# Patient Record
Sex: Male | Born: 1945 | Race: White | Hispanic: No | Marital: Married | Smoking: Former smoker
Health system: Southern US, Community
[De-identification: ages and names within clinical notes are randomized; demographics above are authoritative.]

## PROBLEM LIST (undated history)

## (undated) DIAGNOSIS — T7840XA Allergy, unspecified, initial encounter: Secondary | ICD-10-CM

## (undated) DIAGNOSIS — G709 Myoneural disorder, unspecified: Secondary | ICD-10-CM

## (undated) DIAGNOSIS — H269 Unspecified cataract: Secondary | ICD-10-CM

## (undated) DIAGNOSIS — I499 Cardiac arrhythmia, unspecified: Secondary | ICD-10-CM

## (undated) DIAGNOSIS — E7800 Pure hypercholesterolemia, unspecified: Secondary | ICD-10-CM

## (undated) DIAGNOSIS — R931 Abnormal findings on diagnostic imaging of heart and coronary circulation: Secondary | ICD-10-CM

## (undated) DIAGNOSIS — G20A1 Parkinson's disease without dyskinesia, without mention of fluctuations: Secondary | ICD-10-CM

## (undated) DIAGNOSIS — E785 Hyperlipidemia, unspecified: Secondary | ICD-10-CM

## (undated) DIAGNOSIS — G4730 Sleep apnea, unspecified: Secondary | ICD-10-CM

## (undated) DIAGNOSIS — G20 Parkinson's disease: Secondary | ICD-10-CM

## (undated) DIAGNOSIS — I700 Atherosclerosis of aorta: Secondary | ICD-10-CM

## (undated) HISTORY — DX: Abnormal findings on diagnostic imaging of heart and coronary circulation: R93.1

## (undated) HISTORY — DX: Myoneural disorder, unspecified: G70.9

## (undated) HISTORY — DX: Atherosclerosis of aorta: I70.0

## (undated) HISTORY — DX: Unspecified cataract: H26.9

## (undated) HISTORY — PX: WISDOM TOOTH EXTRACTION: SHX21

## (undated) HISTORY — DX: Hyperlipidemia, unspecified: E78.5

## (undated) HISTORY — DX: Sleep apnea, unspecified: G47.30

## (undated) HISTORY — PX: TONSILLECTOMY: SUR1361

## (undated) HISTORY — DX: Cardiac arrhythmia, unspecified: I49.9

## (undated) HISTORY — DX: Allergy, unspecified, initial encounter: T78.40XA

---

## 2018-11-04 LAB — HM COLONOSCOPY

## 2020-10-16 ENCOUNTER — Other Ambulatory Visit: Payer: Self-pay

## 2020-10-16 ENCOUNTER — Ambulatory Visit
Admission: EM | Admit: 2020-10-16 | Discharge: 2020-10-16 | Disposition: A | Discharge status: Home / Self Care | Payer: Medicare Other

## 2020-10-16 DIAGNOSIS — J3489 Other specified disorders of nose and nasal sinuses: Secondary | ICD-10-CM

## 2020-10-16 DIAGNOSIS — R0981 Nasal congestion: Secondary | ICD-10-CM

## 2020-10-16 DIAGNOSIS — J019 Acute sinusitis, unspecified: Secondary | ICD-10-CM | POA: Diagnosis not present

## 2020-10-16 DIAGNOSIS — R059 Cough, unspecified: Secondary | ICD-10-CM

## 2020-10-16 MED ORDER — AMOXICILLIN 500 MG PO CAPS: 500.0000 mg | ORAL_CAPSULE | Freq: Three times a day (TID) | ORAL | 0 refills | Status: DC

## 2020-10-16 MED ORDER — PREDNISONE 20 MG PO TABS: ORAL_TABLET | ORAL | 0 refills | Status: DC

## 2020-10-16 MED ORDER — CETIRIZINE HCL 10 MG PO TABS: 10.0000 mg | ORAL_TABLET | Freq: Every day | ORAL | 0 refills | Status: DC

## 2020-10-16 NOTE — ED Triage Notes (Signed)
Patient states he has had sinus congestion, drainage and pain x 2 weeks. Pt feels it is from either the dust or animal dander from family pets. Pt is aox4 and ambulatory.

## 2020-10-16 NOTE — ED Provider Notes (Signed)
[place]   MRN: [id number] DOB: 01-04-1946  Subjective:   [name] is a 74 y.o. male presenting for 2-week history of sinus congestion, sinus pressure and sinus pain.  Patient has also had a cough but no chest pain, shortness of breath, body aches.  Patient presents with his wife who also has very similar symptoms had for the same timeframe.  Symptoms started after they have been packing and moving, had a lot of exposure to dust.  Patient has had both Covid vaccinations, the booster shot as well.  Denies history of heart failure, asthma, smoking history.  He does have a history of allergies.  No current facility-administered medications for this encounter. No current outpatient medications on file.   No Known Allergies  History reviewed. No pertinent past medical history.   History reviewed. No pertinent surgical history.  History reviewed. No pertinent family history.  Social History   Tobacco Use  . Smoking status: Never Smoker  . Smokeless tobacco: Never Used  Vaping Use  . Vaping Use: Never used  Substance Use Topics  . Alcohol use: Yes    Comment: occasionally  . Drug use: Never    ROS   Objective:   Vitals: BP 126/76 (BP Location: Right Arm)   Pulse 89   Temp 98 F (36.7 C) (Oral)   Resp 20   SpO2 96%   Physical Exam Constitutional:      General: He is not in acute distress.    Appearance: Normal appearance. He is well-developed. He is not ill-appearing, toxic-appearing or diaphoretic.  HENT:     Head: Normocephalic and atraumatic.     Right Ear: External ear normal.     Left Ear: External ear normal.     Nose: Congestion present.     Mouth/Throat:     Mouth: Mucous membranes are moist.     Comments: Significant postnasal drainage overlying pharynx. Eyes:     General: No scleral icterus.    Extraocular Movements: Extraocular movements intact.     Pupils: Pupils are equal, round, and reactive to light.  Cardiovascular:      Rate and Rhythm: Normal rate and regular rhythm.     Heart sounds: Normal heart sounds. No murmur heard.  No friction rub. No gallop.   Pulmonary:     Effort: Pulmonary effort is normal. No respiratory distress.     Breath sounds: Normal breath sounds. No stridor. No wheezing, rhonchi or rales.  Neurological:     Mental Status: He is alert and oriented to person, place, and time.  Psychiatric:        Mood and Affect: Mood normal.        Behavior: Behavior normal.        Thought Content: Thought content normal.        Judgment: Judgment normal.     Assessment and Plan :   PDMP not reviewed this encounter.  1. Acute non-recurrent sinusitis, unspecified location   2. Sinus pressure   3. Sinus congestion   4. Cough     Will start empiric treatment for sinusitis with amoxicillin.  Patient requested more aggressive management and therefore will use prednisone course especially given his trouble with dust and pet dander.  Recommended supportive care otherwise and long-term use of Zyrtec given that they live in [place] and have a history of allergies. Counseled patient on potential for adverse effects with medications prescribed/recommended today, ER and return-to-clinic precautions discussed, patient verbalized understanding.  Jaynee Eagles, [place] 10/16/20 [phone]

## 2020-11-25 ENCOUNTER — Encounter: Payer: Self-pay | Admitting: Family Medicine

## 2020-11-25 ENCOUNTER — Ambulatory Visit (INDEPENDENT_AMBULATORY_CARE_PROVIDER_SITE_OTHER): Payer: Medicare HMO | Admitting: Family Medicine

## 2020-11-25 ENCOUNTER — Telehealth: Payer: Self-pay | Admitting: *Deleted

## 2020-11-25 ENCOUNTER — Other Ambulatory Visit: Payer: Self-pay

## 2020-11-25 VITALS — BP 116/62 | HR 75 | Temp 97.5°F | Ht 72.0 in | Wt 232.5 lb

## 2020-11-25 DIAGNOSIS — G20A1 Parkinson's disease without dyskinesia, without mention of fluctuations: Secondary | ICD-10-CM | POA: Insufficient documentation

## 2020-11-25 DIAGNOSIS — Z125 Encounter for screening for malignant neoplasm of prostate: Secondary | ICD-10-CM | POA: Diagnosis not present

## 2020-11-25 DIAGNOSIS — L42 Pityriasis rosea: Secondary | ICD-10-CM | POA: Diagnosis not present

## 2020-11-25 DIAGNOSIS — E782 Mixed hyperlipidemia: Secondary | ICD-10-CM | POA: Diagnosis not present

## 2020-11-25 DIAGNOSIS — G20 Parkinson's disease: Secondary | ICD-10-CM | POA: Diagnosis not present

## 2020-11-25 DIAGNOSIS — E785 Hyperlipidemia, unspecified: Secondary | ICD-10-CM | POA: Insufficient documentation

## 2020-11-25 DIAGNOSIS — Z91038 Other insect allergy status: Secondary | ICD-10-CM | POA: Insufficient documentation

## 2020-11-25 DIAGNOSIS — R600 Localized edema: Secondary | ICD-10-CM | POA: Insufficient documentation

## 2020-11-25 DIAGNOSIS — G20C Parkinsonism, unspecified: Secondary | ICD-10-CM | POA: Insufficient documentation

## 2020-11-25 DIAGNOSIS — H9190 Unspecified hearing loss, unspecified ear: Secondary | ICD-10-CM | POA: Insufficient documentation

## 2020-11-25 LAB — COMPREHENSIVE METABOLIC PANEL
ALT: 13 U/L (ref 0–53)
AST: 24 U/L (ref 0–37)
Albumin: 4.5 g/dL (ref 3.5–5.2)
Alkaline Phosphatase: 33 U/L — ABNORMAL LOW (ref 39–117)
BUN: 16 mg/dL (ref 6–23)
CO2: 27 mEq/L (ref 19–32)
Calcium: 10.1 mg/dL (ref 8.4–10.5)
Chloride: 106 mEq/L (ref 96–112)
Creatinine, Ser: 0.73 mg/dL (ref 0.40–1.50)
GFR: 90.8 mL/min (ref >60.00)
Glucose, Bld: 89 mg/dL (ref 70–99)
Potassium: 4.1 mEq/L (ref 3.5–5.1)
Sodium: 139 mEq/L (ref 135–145)
Total Bilirubin: 0.5 mg/dL (ref 0.2–1.2)
Total Protein: 7.1 g/dL (ref 6.0–8.3)

## 2020-11-25 LAB — LIPID PANEL
Cholesterol: 143 mg/dL (ref 0–200)
HDL: 51.7 mg/dL (ref >39.00)
LDL Cholesterol: 74 mg/dL (ref 0–99)
NonHDL: 90.91
Total CHOL/HDL Ratio: 3
Triglycerides: 85 mg/dL (ref 0.0–149.0)
VLDL: 17 mg/dL (ref 0.0–40.0)

## 2020-11-25 LAB — PSA, MEDICARE: PSA: 0.26 ng/ml (ref 0.10–4.00)

## 2020-11-25 MED ORDER — DESONIDE 0.05 % EX CREA: TOPICAL_CREAM | Freq: Two times a day (BID) | CUTANEOUS | 0 refills | Status: DC

## 2020-11-25 NOTE — Assessment & Plan Note (Addendum)
Taking carbidopa-levodopa 25-100 mg. Tremor is improved. Referral to neurology for continued support

## 2020-11-25 NOTE — Telephone Encounter (Signed)
Agree with checking with patient and doing PA if patient requests. This was previously prescribed by his dermatologist.

## 2020-11-25 NOTE — Assessment & Plan Note (Signed)
Advised f/u if this recurs.

## 2020-11-25 NOTE — Telephone Encounter (Signed)
Received fax from [employer] requesting PA for Desonide 0.05% cream.  Per [employer]:  (formulary) drugs are triamcinolone acetonide topical cream, mometasone topical cream, fluticasone propionate topical cream, betamethasone valerate topical cream AND hydrocortisone topical cream.  Will forward to Dr. Einar Pheasant to see if she wants to prescribe something on formulary or attempt PA.  I did try and call patient to see what other creams he had tried in the past but his voicemail isn't set up so I was unable to leave a message.

## 2020-11-25 NOTE — Assessment & Plan Note (Signed)
Refill desonide as this improves symptoms on right ear

## 2020-11-25 NOTE — Patient Instructions (Addendum)
Great to meet you!  Reach out if you need anything  Have your pharmacy send refill requests when due  #Referral I have placed a referral to a specialist for you. You should receive a phone call from the specialty office. Make sure your voicemail is not full and that if you are able to answer your phone to unknown or new numbers.   It may take up to 2 weeks to hear about the referral. If you do not hear anything in 2 weeks, please call our office and ask to speak with the referral coordinator.

## 2020-11-25 NOTE — Assessment & Plan Note (Signed)
Taking low furosemide 10 mg to help with swelling. Compression socks as well. Cont lasix 10 mg

## 2020-11-25 NOTE — Telephone Encounter (Signed)
Sent a mychart message to pt requesting more information in regards to the cream.

## 2020-11-25 NOTE — Assessment & Plan Note (Signed)
Check labs. Cont fenofibrate 160 mg

## 2020-11-25 NOTE — Progress Notes (Signed)
Subjective:     [name] is a 75 y.o. male presenting for Establish Care     HPI   #skin wound - fell in 2018 and this became a refractory cellulitis - saw vascular surgery last December - lymphedema  - treating with compression sock  #parkinson's - doing well - diagnosed a few years ago - would like to have a local neurologist   Review of Systems   Social History   Tobacco Use  Smoking Status Former Smoker  . Types: Pipe  Smokeless Tobacco Never Used        Objective:    BP Readings from Last 3 Encounters:  11/25/20 116/62  10/16/20 126/76   Wt Readings from Last 3 Encounters:  11/25/20 232 lb 8 oz (105.5 kg)    BP 116/62   Pulse 75   Temp (!) 97.5 F (36.4 C) (Temporal)   Ht 6' (1.829 m)   Wt 232 lb 8 oz (105.5 kg)   SpO2 97%   BMI 31.53 kg/m    Physical Exam Constitutional:      Appearance: Normal appearance. He is not ill-appearing or diaphoretic.  HENT:     Right Ear: External ear normal.     Left Ear: External ear normal.     Nose: Nose normal.  Eyes:     General: No scleral icterus.    Extraocular Movements: Extraocular movements intact.     Conjunctiva/sclera: Conjunctivae normal.  Cardiovascular:     Rate and Rhythm: Normal rate and regular rhythm.     Heart sounds: No murmur heard.   Pulmonary:     Effort: Pulmonary effort is normal. No respiratory distress.     Breath sounds: Normal breath sounds. No wheezing.  Musculoskeletal:     Cervical back: Neck supple.  Skin:    General: Skin is warm and dry.     Comments: Right ear with erythema  Neurological:     Mental Status: He is alert. Mental status is at baseline.     Comments: No cogwheel  Psychiatric:        Mood and Affect: Mood normal.           Assessment & Plan:   Problem List Items Addressed This Visit      Nervous and Auditory   Parkinson's disease ([place]) - Primary    Taking carbidopa-levodopa 25-100 mg. Tremor is improved. Referral to neurology  for continued support      Relevant Medications   carbidopa-levodopa (SINEMET IR) 25-100 MG tablet   pramipexole (MIRAPEX) 1 MG tablet   Other Relevant Orders   Ambulatory referral to Neurology     Musculoskeletal and Integument   Pityriasis rosea    Refill desonide as this improves symptoms on right ear      Relevant Medications   desonide (DESOWEN) 0.05 % cream     Other   Edema of right lower leg    Taking low furosemide 10 mg to help with swelling. Compression socks as well. Cont lasix 10 mg      Relevant Orders   Comprehensive metabolic panel   Hyperlipidemia    Check labs. 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        Other Visit Diagnoses    Screening  for prostate cancer       Relevant Orders   PSA, Medicare       Return in about 1 year (around 11/25/2021).  Lesleigh Noe, MD  This visit occurred during the SARS-CoV-2 public health emergency.  Safety protocols were in place, including screening questions prior to the visit, additional usage of staff PPE, and extensive cleaning of exam room while observing appropriate contact time as indicated for disinfecting solutions.

## 2020-11-26 NOTE — Telephone Encounter (Signed)
Pt states that he is good for at least 3 months with the cream he has at home. No need for refill at this time.

## 2021-02-07 ENCOUNTER — Encounter: Payer: Self-pay | Admitting: Neurology

## 2021-02-07 ENCOUNTER — Ambulatory Visit: Payer: Medicare HMO | Admitting: Neurology

## 2021-02-07 VITALS — BP 106/64 | HR 80 | Ht 74.0 in | Wt 235.0 lb

## 2021-02-07 DIAGNOSIS — E669 Obesity, unspecified: Secondary | ICD-10-CM

## 2021-02-07 DIAGNOSIS — G4719 Other hypersomnia: Secondary | ICD-10-CM

## 2021-02-07 DIAGNOSIS — R498 Other voice and resonance disorders: Secondary | ICD-10-CM | POA: Diagnosis not present

## 2021-02-07 DIAGNOSIS — G20 Parkinson's disease: Secondary | ICD-10-CM | POA: Diagnosis not present

## 2021-02-07 DIAGNOSIS — R634 Abnormal weight loss: Secondary | ICD-10-CM

## 2021-02-07 DIAGNOSIS — R0683 Snoring: Secondary | ICD-10-CM | POA: Diagnosis not present

## 2021-02-07 DIAGNOSIS — R2681 Unsteadiness on feet: Secondary | ICD-10-CM | POA: Diagnosis not present

## 2021-02-07 MED ORDER — PRAMIPEXOLE DIHYDROCHLORIDE 1 MG PO TABS
1.0000 mg | ORAL_TABLET | Freq: Three times a day (TID) | ORAL | 3 refills | Status: DC
Start: 2021-02-07 — End: 2021-12-29

## 2021-02-07 MED ORDER — CARBIDOPA-LEVODOPA 25-100 MG PO TABS
1.0000 | ORAL_TABLET | Freq: Three times a day (TID) | ORAL | 3 refills | Status: DC
Start: 2021-02-07 — End: 2021-12-29

## 2021-02-07 NOTE — Progress Notes (Signed)
Subjective:    Patient ID: [name] is a 75 y.o. male.  HPI     Star Age, MD, PhD [facility] [address] [place], [place] [address] Dear Dr. Einar Pheasant,   I saw your patient, [name], upon your kind request of my neurologic clinic today for initial consultation of his parkinsonism, as a transfer of care.  The patient is accompanied by his wife today, who supplements the history.  As you know, [name] is a 75 year old right-handed gentleman with an underlying medical history of cellulitis, lymphedema, low back pain, hyperlipidemia, and mild obesity, who reports symptoms of Parkinson's disease dating back to late 2018 or early 2019.  He was diagnosed with Parkinson's disease in or around May 2019.  Initially, symptoms consisted of changes in his voice, changes noted in his facial expression, posture and gait changes.  He noticed a hand tremor, it was bilateral as far as he and his wife recall.  Symptoms were not necessarily worse on one side.  He is right-handed and noticed his tremor more on the right.  His mother had a diagnosis of essential tremor.  She lived to be 34, father died at 68.  Father had prostate cancer.  Patient has had changes in his posture over time and gait difficulty, has had some falls, last fall about a month or 2 ago.  He had significant daytime somnolence as part of his initial presentation but no evidence of dream enactment disorder.  He has never had a sleep study.  He does snore but snoring is better when he sleeps elevated with head of bed.  They have purchased an adjustable bed. He is retired, he is a Therapist, nutritional, worked as a Water quality scientist in college.  They moved to [facility].  They moved to [place] in November 2021.  They have a daughter in [place].  Sister also lives close by.  He smokes cigars occasionally.  He drinks alcohol in the form of beer, 1 or 2/day on average.   He previously  followed with a neurologist in [place].  I reviewed your office note from 11/25/2020.  He has been on Sinemet 25-100 mg strength 1 pill 3 times daily and Mirapex 1 mg strength 1 pill 3 times daily. I have reviewed records from his physician, Dr. Aniceto Boss.  His last visit was 07/21/2020, prior to that he was seen on 03/18/2020, at which time his Mirapex was increased from 0.5 mg 3 times daily to 1 mg 3 times daily.  He had a visit on 11/18/2019.  At his visit on 07/14/2019 his Mirapex was 0.25 mg 3 times daily and he was on Sinemet 25-100 mg strength 1 pill 3 times daily.  He was started on Azilect on 11/26/2018.  This was discontinued in May 2020 due to cost.  In October 2019 he was started on Requip and Rytary.  Rytary was discontinued due to cost.  He had side effects of drowsiness when he was on Requip.  He had a DaTscan which was positive for asymmetrically diminished uptake. He had an EEG on 08/24/2020 and I reviewed the report: Impression: Normal EEG during wakefulness.  He had a nuclear medicine DaTscan in [place], [place] on 07/23/2018 and I reviewed the results: Impression: #1 abnormal study. 2.  Asymmetrically diminished putamen activity compatible with Parkinson's disease or related syndrome.  He denies any significant orthostatic lightheadedness.  His wife has noticed a change in his posture.  He had physical therapy when in [place] which was helpful.  He tries to stay active, he is using a stationary bike.  He has occasional constipation.  He has had significant weight loss in the recent past.  He has not had any specific work-up for weight loss.  His wife does report that he eats slowly which is not a new trait.  His Past Medical History Is Significant For: No past medical history on file.  His Past Surgical History Is Significant For: Past Surgical History:  Procedure Laterality Date  . TONSILLECTOMY      His Family History Is Significant For: Family History  Problem Relation  Age of Onset  . Arthritis Mother   . Hearing loss Mother   . Hypertension Mother   . Prostate cancer Father   . Hearing loss Father   . Heart attack Father   . Hyperlipidemia Father   . Hypertension Sister   . Diabetes Daughter   . Heart disease Maternal Grandmother   . Hypertension Maternal Grandmother   . Arthritis Maternal Grandmother     His Social History Is Significant For: Social History   Socioeconomic History  . Marital status: Married    Spouse name: [name]  . Number of children: 1  . Years of education: master's degree  . Highest education level: Not on file  Occupational History  . Not on file  Tobacco Use  . Smoking status: Former Smoker    Types: Pipe  . Smokeless tobacco: Never Used  Vaping Use  . Vaping Use: Never used  Substance and Sexual Activity  . Alcohol use: Yes    Comment: daily, 1-2 servings  . Drug use: Never  . Sexual activity: Yes    Birth control/protection: Post-menopausal  Other Topics Concern  . Not on file  Social History Narrative   11/25/20   From: [place], but from this area, moved back 2022   Living: with wife, [name] [phone])   Work: retired Copywriter, advertising for [employer], [employer] Service, played piano      Family: Cristen in [place], 2 granddaughters       Enjoys: piano, read, crosswords      Exercise: planning to go to the [employer]   Diet: good, yogurt, cereal, big meal at lunchtime, light dinner      Safety   Seat belts: Yes    Guns: no   Safe in relationships: Yes    Social Determinants of Radio broadcast assistant Strain: Not on file  Food Insecurity: Not on file  Transportation Needs: Not on file  Physical Activity: Not on file  Stress: Not on file  Social Connections: Not on file    His Allergies Are:  No Known Allergies:   His Current Medications Are:  Outpatient Encounter Medications as of 02/07/2021  Medication Sig  . aspirin 81 MG chewable tablet Chew 81 mg by mouth daily.  .  carbidopa-levodopa (SINEMET IR) 25-100 MG tablet Take 1 tablet by mouth 3 (three) times daily.  . cetirizine (ZYRTEC ALLERGY) 10 MG tablet Take 1 tablet (10 mg total) by mouth daily. (Patient taking differently: Take 10 mg by mouth as needed.)  . desonide (DESOWEN) 0.05 % cream Apply topically 2 (two) times daily.  . fenofibrate 160 MG tablet Take 160 mg by mouth daily.  . furosemide (LASIX) 10 MG/ML solution Take 10 mg by mouth daily.  Marland Kitchen glucosamine-chondroitin 500-400 MG tablet Take 1 tablet by mouth daily.  . Multiple Vitamins-Minerals (MULTIVITAMIN WITH MINERALS) tablet  Take 1 tablet by mouth daily.  . mupirocin ointment (BACTROBAN) 2 % Apply topically as needed.  . pramipexole (MIRAPEX) 1 MG tablet Take 1 tablet (1 mg total) by mouth in the morning, at noon, and at bedtime.  . [DISCONTINUED] carbidopa-levodopa (SINEMET IR) 25-100 MG tablet Take 1 tablet by mouth 3 (three) times daily.  . [DISCONTINUED] pramipexole (MIRAPEX) 1 MG tablet Take 1 mg by mouth in the morning, at noon, and at bedtime.   No facility-administered encounter medications on file as of 02/07/2021.  :   Review of Systems:  Out of a complete 14 point review of systems, all are reviewed and negative with the exception of these symptoms as listed below:    Review of Systems  Neurological:       Here for consult on PD. Pt reports he was d/x with PD in [place] back in 2019. Dat scan completed and confirmed PD. Pt was started Rytary after the DAT Scan but d/c due to price. Was tried on Requip but d/c due to s/e ( mainly fatigue). Pt is currently taking Pramipexole 1 mg tid and Carb/Levo 25-100 mg tid. Reports he is tolerating these meds well.   Pt reports he struggles with balance in the morning and right leg weakness. He also reports a 30 LB weight loss since 2019, softer speaking volume, along with hunched posture when walking . 1 fall over the last 3 months denies any serious injuries.     Objective:   Neurological Exam  Physical Exam Physical Examination:   Vitals:   02/07/21 1310  BP: 106/64  Pulse: 80  SpO2: 97%    General Examination: The patient is a very pleasant 75 y.o. male in no acute distress. He appears well-developed and well-nourished and well groomed.   HEENT: Normocephalic, atraumatic, pupils are equal, round and reactive to light, extraocular tracking is mild to moderately impaired with saccadic breakdown, no obvious gaze limitation, maybe mildly to upgaze.  He has moderate facial masking, moderate hypophonia, no significant dysarthria or sialorrhea.  Hearing is grossly intact with bilateral hearing aids in place.  Normal facial sensation.  Airway examination reveals mild airway crowding, tongue protrudes centrally and palate elevates symmetrically, no carotid bruits.  Mild nuchal rigidity noted.  No lip, neck or jaw tremor.  Chest: Clear to auscultation without wheezing, rhonchi or crackles noted.  Heart: S1+S2+0, regular and normal without murmurs, rubs or gallops noted.   Abdomen: Soft, non-tender and non-distended.  Extremities: There is trace edema in the left ankle, right distal lower extremity has 1+ pitting edema.   Skin: Warm and dry without trophic changes noted.   Musculoskeletal: exam reveals no obvious joint deformities.   Neurologically:  Mental status: The patient is awake, alert and oriented in all 4 spheres. His immediate and remote memory, attention, language skills and fund of knowledge are appropriate. Thought process is linear. Mood is normal and affect is normal.  Cranial nerves II - XII are as described above under HEENT exam.  Motor exam: Normal bulk, global strength normal, mild increase in tone in the left upper extremity noted, without telltale cogwheeling, no resting tremor.  On Archimedes spiral drawing, he has minimal insecurity with the right hand which is his dominant hand, mild to moderate difficulty with mild trembling in the left  upper extremity.  He has no significant postural or action tremor.    Romberg is not tested due to safety concerns. Fine motor skills and coordination: He has mild difficulty globally  with fine motor skills including finger taps, hand movements, foot taps.  Slightly more noticeable on the left compared to right.    Cerebellar testing: No dysmetria or intention tremor. There is no truncal or gait ataxia.  Sensory exam: intact to light touch, temperature and vibration in the upper and lower extremities.  Gait, station and balance: He stands with mild difficulty, posture is moderately stooped.  He walks with a slight limp on the right.  He walks with a slight waddle.  No telltale small steps or shuffling but decreased arm swing bilaterally noted.  No walking aid.  Assessment and Plan:   In summary, ARYON NHAM is a very pleasant 75 y.o.-year old male with an underlying medical history of cellulitis, lymphedema, low back pain, hyperlipidemia, and mild obesity, who presents for evaluation of his parkinsonism, he was previously diagnosed with Parkinson's disease in early 2019.  He had a DaTscan on 07/23/2018 which indicated an abnormal study with asymmetrically diminished putamen activity compatible with Parkinson's disease or related syndrome.  He has been on several different medications over time.  Some of the medications were cost prohibitive including Rytary and Azilect.  He had side effects with ropinirole, particularly drowsiness.  He is currently established on generic Sinemet 25-100 mg strength 1 pill 3 times daily as well as pramipexole 1 mg 3 times daily.  He has had daytime somnolence and given his snoring and mild obesity, it would be of help to exclude underlying sleep disordered breathing.  He may be at risk for OSA.  He has lost weight, mostly unintentional, he is advised to follow-up with your office regarding work-up for unintentional weight loss.  Parkinson's patients do have a tendency to  lose weight over time, particularly due to burning excess calories due to involuntary movements and in part due to slower eating and smaller portions over time.  He is advised to continue to stay active mentally and physically.  He is furthermore advised to reduce and eliminate daily alcohol consumption as it can affect him adversely with respect to his balance and may not combine well with his current medications.  He had physical therapy in the past which was successful.  He is advised to seek evaluation through neuro rehab with PT, OT and ST.  He is agreeable to a referral.  He is advised to continue with his current medication regimen and I renewed his prescriptions.  We talked about his gait changes and posture changes and parkinsonism in general.  He has some left-sided lateralization, possibly a left-sided predominant Parkinson's disease.  Currently, he does not have any telltale tremor.  He is advised to follow-up in this clinic routinely in 3 to 4 months, sooner if needed.  We will schedule a sleep study and call him with the results in the interim as well.  I answered all the questions today and the patient and his wife were in agreement. Thank you very much for allowing me to participate in the care of this nice patient. If I can be of any further assistance to you please do not hesitate to call me at [phone].  Sincerely,   Star Age, MD, PhD

## 2021-02-07 NOTE — Patient Instructions (Signed)
It was nice to meet you both today.  I think you can continue with your current Parkinson's medication.  It sounds like you have remained fairly stable, which is reassuring. Nevertheless, as you know, this disease does progress with time. It can affect your balance, your memory, your mood, your bowel and bladder function, your posture, balance and walking and your activities of daily living. However, there are good supportive treatments and symptomatic treatments available, so most patients have a change to a good quality life and life expectancy is not typically altered. Overall you are doing fairly well but I do want to suggest a few things today:  Remember to drink plenty of fluid at least 6 glasses (8 oz each), eat healthy meals and do not skip any meals. Try to eat protein with a every meal and eat a healthy snack such as fruit or nuts in between meals. Try to keep a regular sleep-wake schedule and try to exercise daily, particularly in the form of walking, 20-30 minutes a day, if you can.   Taking your medication on schedule is key.   Try to stay active physically and mentally.   As far as your medications are concerned, I would like to suggest that you continue to take your current medications, I have renewed your prescriptions.   I have made a referral to neuro rehab for evaluations with PT, OT and speech therapy.  I have ordered a sleep study to rule out underlying sleep apnea as a possible cause of your sleepiness.  I would like to see you back in 3-4 months, sooner if we need to. Please call us with any interim questions, concerns, problems, updates or refill requests.  Our phone number is [phone]. We also have an after hours call service for urgent matters and there is a physician on-call for urgent questions, that cannot wait till the next work day. For any emergencies you know to call 911 or go to the nearest emergency room.   You can email me through my chart and also leave a  phone message for Jinny Blossom, my nurse.

## 2021-02-09 ENCOUNTER — Ambulatory Visit: Payer: Medicare HMO | Admitting: Neurology

## 2021-02-14 ENCOUNTER — Other Ambulatory Visit: Payer: Self-pay

## 2021-02-14 MED ORDER — FENOFIBRATE 160 MG PO TABS
160.0000 mg | ORAL_TABLET | Freq: Every day | ORAL | 3 refills | Status: DC
Start: 2021-02-14 — End: 2022-02-14

## 2021-02-14 NOTE — Telephone Encounter (Signed)
Fax received from pharmacy for refill on Fenofibrate. Never given by you in the past. Last seen in office on 11/25/2020.

## 2021-02-21 ENCOUNTER — Ambulatory Visit (INDEPENDENT_AMBULATORY_CARE_PROVIDER_SITE_OTHER): Payer: Medicare HMO | Admitting: Neurology

## 2021-02-21 ENCOUNTER — Other Ambulatory Visit: Payer: Self-pay

## 2021-02-21 DIAGNOSIS — G4761 Periodic limb movement disorder: Secondary | ICD-10-CM

## 2021-02-21 DIAGNOSIS — E669 Obesity, unspecified: Secondary | ICD-10-CM

## 2021-02-21 DIAGNOSIS — G4733 Obstructive sleep apnea (adult) (pediatric): Secondary | ICD-10-CM | POA: Diagnosis not present

## 2021-02-21 DIAGNOSIS — G4720 Circadian rhythm sleep disorder, unspecified type: Secondary | ICD-10-CM

## 2021-02-21 DIAGNOSIS — R498 Other voice and resonance disorders: Secondary | ICD-10-CM

## 2021-02-21 DIAGNOSIS — G4719 Other hypersomnia: Secondary | ICD-10-CM

## 2021-02-21 DIAGNOSIS — R2681 Unsteadiness on feet: Secondary | ICD-10-CM

## 2021-02-21 DIAGNOSIS — E66811 Obesity, class 1: Secondary | ICD-10-CM

## 2021-02-21 DIAGNOSIS — G20 Parkinson's disease: Secondary | ICD-10-CM

## 2021-02-21 DIAGNOSIS — R0683 Snoring: Secondary | ICD-10-CM

## 2021-02-21 DIAGNOSIS — R9431 Abnormal electrocardiogram [ECG] [EKG]: Secondary | ICD-10-CM

## 2021-02-21 DIAGNOSIS — R634 Abnormal weight loss: Secondary | ICD-10-CM

## 2021-02-21 DIAGNOSIS — G20C Parkinsonism, unspecified: Secondary | ICD-10-CM

## 2021-03-04 NOTE — Addendum Note (Signed)
Addended by: Star Age on: 03/04/2021 01:59 PM   Modules accepted: Orders

## 2021-03-04 NOTE — Progress Notes (Signed)
Patient referred by Dr. Einar Pheasant for his PD, seen by me on 02/07/21, diagnostic PSG on 02/21/21.    Please call and notify the patient that the recent sleep study showed obstructive sleep apnea. OSA is overall mild, but worth treating to see if he feels better after treatment. To that end I recommend treatment for this in the form of autoPAP, which means, that we don't have to bring him back for a second sleep study with CPAP, but will let him try an autoPAP machine at home, through a DME company (of his choice, or as per insurance requirement). The DME representative will educate him on how to use the machine, how to put the mask on, etc. I have placed an order in the chart. Please send referral, talk to patient, send report to referring MD. We will need a FU in sleep clinic for 10 weeks post-PAP set up, please arrange that with me or one of our NPs. Thanks,   He was also noted to have significant leg twitching in his sleep but these did not actually cause any major sleep disruptions.  His leg twitching may correlate with his Parkinson's disease diagnosis.  In addition, on EKG, he had frequent irregularities including extra beats which we call PACs most likely.  I would like for him to follow-up with his primary care physician about this and get a full EKG, also consider seeing a cardiologist. Please route PSG report to PCP as well for reference.   Star Age, MD, PhD [facility])

## 2021-03-04 NOTE — Procedures (Signed)
PATIENT'S NAME:  [name], [name] DOB:      12-03-1945      MR#:    [id number]     DATE OF RECORDING: 02/21/2021 REFERRING M.D.:  Waunita Schooner, MD Study Performed:   Baseline Polysomnogram HISTORY: 75 year old man with a history of cellulitis, lymphedema, low back pain, hyperlipidemia, Parkinson's disease and mild obesity, who reports snoring and daytime somnolence. The patient endorsed the Epworth Sleepiness Scale at 7 points. The patient's weight 235 pounds with a height of 72 (inches), resulting in a BMI of 32. kg/m2. The patient's neck circumference measured 16 inches.  CURRENT MEDICATIONS: Aspirin, Sinement IR, Zyrtec, Desowen, Fenofibrate, Lasix, Glucosamine-Chondroitin, Multivitamin with Minerals, Bactroban, Mirapex   PROCEDURE:  This is a multichannel digital polysomnogram utilizing the Somnostar 11.2 system.  Electrodes and sensors were applied and monitored per AASM Specifications.   EEG, EOG, Chin and Limb EMG, were sampled at 200 Hz.  ECG, Snore and Nasal Pressure, Thermal Airflow, Respiratory Effort, CPAP Flow and Pressure, Oximetry was sampled at 50 Hz. Digital video and audio were recorded.      BASELINE STUDY  Lights Out was at 21:41 and Lights On at 05:09.  Total recording time (TRT) was 448.5 minutes, with a total sleep time (TST) of 297.5 minutes.   The patient's sleep latency was 19.5 minutes.  REM latency was 141 minutes, which is delayed. The sleep efficiency was 66.3%.     SLEEP ARCHITECTURE: WASO (Wake after sleep onset) was 133.5 minutes with mild to moderate sleep fragmentation noted. There were 64 minutes in Stage N1, 167.5 minutes Stage N2, 28.5 minutes Stage N3 and 37.5 minutes in Stage REM.  The percentage of Stage N1 was 21.5%, which is markedly increased, Stage N2 was 56.3%, Stage N3 was 9.6% and Stage R (REM sleep) was 12.6%, which is reduced. The arousals were noted as: 11 were spontaneous, 5 were associated with PLMs, 1 were associated with respiratory  events.  RESPIRATORY ANALYSIS:  There were a total of 28 respiratory events:  1 obstructive apneas, 0 central apneas and 0 mixed apneas with a total of 1 apneas and an apnea index (AI) of .2 /hour. There were 27 hypopneas with a hypopnea index of 5.4 /hour. The patient also had 0 respiratory event related arousals (RERAs).      The total APNEA/HYPOPNEA INDEX (AHI) was 5.6/hour and the total RESPIRATORY DISTURBANCE INDEX was  5.6 /hour.  19 events occurred in REM sleep and 18 events in NREM. The REM AHI was 30.4 /hour, versus a non-REM AHI of 2.1. The patient spent 136.5 minutes of total sleep time in the supine position and 161 minutes in non-supine.. The supine AHI was 10.5 versus a non-supine AHI of 1.5.  OXYGEN SATURATION & C02:  The Wake baseline 02 saturation was 91%, with the lowest being 83%. Time spent below 89% saturation equaled 12 minutes.  PERIODIC LIMB MOVEMENTS: The patient had a total of 423 Periodic Limb Movements.  The Periodic Limb Movement (PLM) index was 85.3 and the PLM Arousal index was 1./hour.  Audio and video analysis did not show any abnormal or unusual movements, behaviors, phonations or vocalizations. The patient took no bathroom breaks. Mild snoring was noted. The EKG was irregular throughout the night with what appeared to be frequent PACs (premature atrial contractions).   Post-study, the patient indicated that sleep was the same as usual.   IMPRESSION:  1. Obstructive Sleep Apnea (OSA) 2. Periodic Limb Movement Disorder (PLMD) 3. Dysfunctions associated with sleep stages or  arousal from sleep 4. Non-specific abnormal EKG  RECOMMENDATIONS:  1. This study demonstrates overall mild obstructive sleep apnea, severe in REM sleep with a total AHI of 5.6/hour, REM AHI of 30.4/hour, and O2 nadir of 83%. Given the patient's medical history and sleep related complaints, treatment with positive airway pressure is recommended; this can be achieved in the form of autoPAP.  Alternatively, a full-night CPAP titration study would allow optimization of therapy if needed. Other treatment options may include avoidance of supine sleep position along with weight loss, upper airway or jaw surgery in selected patients or the use of an oral appliance in certain patients. ENT evaluation and/or consultation with a maxillofacial surgeon or dentist may be feasible in some instances. Please note that untreated obstructive sleep apnea may carry additional perioperative morbidity. Patients with significant obstructive sleep apnea should receive perioperative PAP therapy and the surgeons and particularly the anesthesiologist should be informed of the diagnosis and the severity of the sleep disordered breathing.  2. The study showed an irregular EKG throughout the night with what appeared to be frequent PACs on single lead EKG; clinical correlation is recommended and consultation with cardiology may be feasible.  3. Severe PLMs (periodic limb movements of sleep) were noted during this study with no significant arousals; clinical correlation is recommended. Patients with Parkinson's disease, have a higher risk for PLM's and also restless leg symptoms.  PLM's may improve with sleep apnea treatment.   4. The patient should be cautioned not to drive, work at heights, or operate dangerous or heavy equipment when tired or sleepy. Review and reiteration of good sleep hygiene measures should be pursued with any patient. 5. The patient will be seen in follow-up by Dr. Rexene Alberts at [facility] for discussion of the test results and further management strategies. The referring provider will be notified of the test results.  I certify that I have reviewed the entire raw data recording prior to the issuance of this report in accordance with the Standards of Accreditation of the American Academy of Sleep Medicine (AASM)  Star Age, MD, PhD Diplomat, American Board of Neurology and Sleep Medicine (Neurology and Sleep  Medicine)

## 2021-03-08 ENCOUNTER — Telehealth: Payer: Self-pay

## 2021-03-08 NOTE — Telephone Encounter (Signed)
I called [name]. I advised [name] that Dr. Rexene Alberts reviewed their sleep study results and found that [name] has mild osa but worth treating to see if he feels better. Dr. Rexene Alberts recommends that [name] start an autopap for treatmetn. I reviewed PAP compliance expectations with the [name]. [name] is agreeable to starting an auto-PAP. I advised [name] that an order will be sent to a DME, [facility], and [employer] will call the [name] within about one week after they file with the [name]'s insurance. [facility] will show the [name] how to use the machine, fit for masks, and troubleshoot the auto-PAP if needed. [name] understands about the national shortage of PAP machines and will CB once he has started on the machine. [name] understands he will need to be seen within 31-90 days of starting. A letter with all of this information in it will be mailed to the [name] as a reminder. I verified with the [name] that the address we have on file is correct. [name] verbalized understanding of results. [name] had no questions at this time but was encouraged to call back if questions arise. I have sent the order to [employer] and have received confirmation that they have received the order.

## 2021-03-08 NOTE — Telephone Encounter (Signed)
-----   Message from Star Age, MD sent at 03/04/2021  1:59 PM EDT ----- Patient referred by Dr. Einar Pheasant for his PD, seen by me on 02/07/21, diagnostic PSG on 02/21/21.    Please call and notify the patient that the recent sleep study showed obstructive sleep apnea. OSA is overall mild, but worth treating to see if he feels better after treatment. To that end I recommend treatment for this in the form of autoPAP, which means, that we don't have to bring him back for a second sleep study with CPAP, but will let him try an autoPAP machine at home, through a DME company (of his choice, or as per insurance requirement). The DME representative will educate him on how to use the machine, how to put the mask on, etc. I have placed an order in the chart. Please send referral, talk to patient, send report to referring MD. We will need a FU in sleep clinic for 10 weeks post-PAP set up, please arrange that with me or one of our NPs. Thanks,   He was also noted to have significant leg twitching in his sleep but these did not actually cause any major sleep disruptions.  His leg twitching may correlate with his Parkinson's disease diagnosis.  In addition, on EKG, he had frequent irregularities including extra beats which we call PACs most likely.  I would like for him to follow-up with his primary care physician about this and get a full EKG, also consider seeing a cardiologist. Please route PSG report to PCP as well for reference.   Star Age, MD, PhD [facility])

## 2021-04-11 ENCOUNTER — Other Ambulatory Visit: Payer: Self-pay

## 2021-04-11 ENCOUNTER — Encounter: Payer: Self-pay | Admitting: Emergency Medicine

## 2021-04-11 ENCOUNTER — Ambulatory Visit
Admission: EM | Admit: 2021-04-11 | Discharge: 2021-04-11 | Disposition: A | Discharge status: Home / Self Care | Payer: Medicare HMO | Attending: Physician Assistant | Admitting: Physician Assistant

## 2021-04-11 DIAGNOSIS — R059 Cough, unspecified: Secondary | ICD-10-CM | POA: Diagnosis not present

## 2021-04-11 DIAGNOSIS — U071 COVID-19: Secondary | ICD-10-CM

## 2021-04-11 HISTORY — DX: Parkinson's disease without dyskinesia, without mention of fluctuations: G20.A1

## 2021-04-11 HISTORY — DX: Pure hypercholesterolemia, unspecified: E78.00

## 2021-04-11 HISTORY — DX: Parkinson's disease: G20

## 2021-04-11 MED ORDER — MOLNUPIRAVIR EUA 200MG CAPSULE: 4.0000 | ORAL_CAPSULE | Freq: Two times a day (BID) | ORAL | 0 refills | Status: AC

## 2021-04-11 MED ORDER — ALBUTEROL SULFATE HFA 108 (90 BASE) MCG/ACT IN AERS
1.0000 | INHALATION_SPRAY | Freq: Four times a day (QID) | RESPIRATORY_TRACT | 0 refills | Status: DC | PRN
Start: 2021-04-11 — End: 2022-01-12

## 2021-04-11 NOTE — Discharge Instructions (Signed)
Use albuterol inhaler as needed for shortness of breath.  Continue with Mucinex May take Ibuprofen as needed for body aches.   You are being prescribed MOLNUPIRAVIR for COVID-19 infection.    Please call the pharmacy or go through the drive through vs going inside if you are picking up the mediation yourself to prevent further spread. If prescribed to a [facility] affiliated pharmacy, a pharmacist will bring the medication out to your car.   ADMINISTRATION INSTRUCTIONS: Take with or without food. Swallow the tablets whole. Don't chew, crush, or break the medications because it might not work as well  For each dose of the medication, you should be taking FOUR tablets at one time, TWICE a day   Finish your full five-day course of Molnupiravir even if you feel better before you're done. Stopping this medication too early can make it less effective to prevent severe illness related to [place].    Molnupiravir is prescribed for YOU ONLY. Don't share it with others, even if they have similar symptoms as you. This medication might not be right for everyone.   Make sure to take steps to protect yourself and others while you're taking this medication in order to get well soon and to prevent others from getting sick with COVID-19.     COMMON SIDE EFFECTS: Diarrhea Nausea  Dizziness    If your COVID-19 symptoms get worse, get medical help right away. Call 911 if you experience symptoms such as worsening cough, trouble breathing, chest pain that doesn't go away, confusion, a hard time staying awake, and pale or blue-colored skin. This medication won't prevent all COVID-19 cases from getting worse.

## 2021-04-11 NOTE — ED Provider Notes (Signed)
[facility]    CSN: [id number] Arrival date & time: 04/11/21  1142      History   Chief Complaint Chief Complaint  Patient presents with  . Cough    HPI [name] is a 75 y.o. male.   Pt tested positive for COVID 4 days ago, sx onset 4 days ago.  He is experiencing cough, congestion, fatigue.  Denies shortness of breath, fever, n/v/d.  Wife is positive as well.  He is taking Mucinex with temporary relief.  Called PCP earlier today and advised to be seen in UC.  Wife was prescribed antiviral, he is requesting the same.      Past Medical History:  Diagnosis Date  . High cholesterol   . Parkinson disease [facility])     Patient Active Problem List   Diagnosis Date Noted  . Parkinson's disease ([place]) 11/25/2020  . Edema of right lower leg 11/25/2020  . Hyperlipidemia 11/25/2020  . 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 Hearing loss 11/25/2020  . Pityriasis rosea 11/25/2020    Past Surgical History:  Procedure Laterality Date  . TONSILLECTOMY         Home Medications    Prior to Admission medications   Medication Sig Start Date End Date Taking? Authorizing Provider  albuterol (VENTOLIN HFA) 108 (90 Base) MCG/ACT inhaler Inhale 1-2 puffs into the lungs every 6 (six) hours as needed for wheezing or shortness of breath. 04/11/21  Yes Alexio Sroka, PA-C  molnupiravir EUA 200 mg CAPS Take 4 capsules (800 mg total) by mouth 2 (two) times daily for 5 days. 04/11/21 04/16/21 Yes Francyne Arreaga, PA-C  aspirin 81 MG chewable tablet Chew 81 mg by mouth daily.    [provider]  carbidopa-levodopa (SINEMET IR) 25-100 MG tablet Take 1 tablet by mouth 3 (three) times daily. 02/07/21   Star Age, MD  cetirizine (ZYRTEC ALLERGY) 10 MG tablet Take 1 tablet (10 mg total) by mouth daily. Patient taking differently: Take 10 mg by mouth as needed. 10/16/20   Jaynee Eagles, PA-C  desonide (DESOWEN) 0.05 % cream Apply topically 2 (two) times daily. 11/25/20   Lesleigh Noe, MD  fenofibrate 160 MG tablet Take 1 tablet (160 mg total) by mouth daily. 02/14/21   Lesleigh Noe, MD  furosemide (LASIX) 10 MG/ML solution Take 10 mg by mouth daily.    [provider]  glucosamine-chondroitin 500-400 MG tablet Take 1 tablet by mouth daily.    [provider]  Multiple Vitamins-Minerals (MULTIVITAMIN WITH MINERALS) tablet Take 1 tablet by mouth daily.    [provider]  mupirocin ointment (BACTROBAN) 2 % Apply topically as needed. 07/14/20   [provider]  pramipexole (MIRAPEX) 1 MG tablet Take 1 tablet (1 mg total) by mouth in the morning, at noon, and at bedtime. 02/07/21   Star Age, MD    Family History Family History  Problem Relation Age of Onset  . Arthritis Mother   . Hearing loss Mother   . Hypertension Mother   . Prostate cancer Father   . Hearing loss Father   . Heart attack Father   . Hyperlipidemia Father   . Hypertension Sister   . Diabetes Daughter   . Heart disease Maternal Grandmother   . Hypertension Maternal Grandmother   . Arthritis Maternal Grandmother     Social History Social History   Tobacco Use  . Smoking status: Former Smoker    Types: Pipe  . Smokeless tobacco: Never  Used  Vaping Use  . Vaping Use: Never used  Substance Use Topics  . Alcohol use: Yes    Comment: daily, 1-2 servings  . Drug use: Never     Allergies   Patient has no known allergies.   Review of Systems Review of Systems  Constitutional: Negative for chills and fever.  HENT: Positive for congestion. Negative for ear pain and sore throat.   Eyes: Negative for pain and visual disturbance.  Respiratory: Positive for cough. Negative for shortness of breath and wheezing.   Cardiovascular: Negative for chest pain and palpitations.  Gastrointestinal: Negative for abdominal pain and vomiting.  Genitourinary: Negative for dysuria and hematuria.  Musculoskeletal: Negative for arthralgias and back pain.  Skin:  Negative for color change and rash.  Neurological: Negative for seizures and syncope.  All other systems reviewed and are negative.    Physical Exam Triage Vital Signs ED Triage Vitals  Enc Vitals Group     BP 04/11/21 1317 115/67     Pulse Rate 04/11/21 1317 84     Resp 04/11/21 1317 18     Temp 04/11/21 1317 97.8 F (36.6 C)     Temp Source 04/11/21 1317 Oral     SpO2 04/11/21 1317 94 %     Weight --      Height --      Head Circumference --      Peak Flow --      Pain Score 04/11/21 1339 0     Pain Loc --      Pain Edu? --      Excl. in [place]? --    No data found.  Updated Vital Signs BP 115/67 (BP Location: Left Arm)   Pulse 84   Temp 97.8 F (36.6 C) (Oral)   Resp 18   SpO2 94%   Visual Acuity Right Eye Distance:   Left Eye Distance:   Bilateral Distance:    Right Eye Near:   Left Eye Near:    Bilateral Near:     Physical Exam Vitals and nursing note reviewed.  Constitutional:      Appearance: He is well-developed.  HENT:     Head: Normocephalic and atraumatic.  Eyes:     Conjunctiva/sclera: Conjunctivae normal.  Cardiovascular:     Rate and Rhythm: Normal rate and regular rhythm.     Heart sounds: No murmur heard.   Pulmonary:     Effort: Pulmonary effort is normal. No respiratory distress.     Breath sounds: Examination of the right-lower field reveals wheezing. Examination of the left-lower field reveals wheezing. Wheezing present.  Abdominal:     Palpations: Abdomen is soft.     Tenderness: There is no abdominal tenderness.  Musculoskeletal:     Cervical back: Neck supple.  Skin:    General: Skin is warm and dry.  Neurological:     Mental Status: He is alert.      UC Treatments / Results  Labs (all labs ordered are listed, but only abnormal results are displayed) Labs Reviewed - No data to display  EKG   Radiology No results found.  Procedures Procedures (including critical care time)  Medications Ordered in UC Medications  - No data to display  Initial Impression / Assessment and Plan / UC Course  I have reviewed the triage vital signs and the nursing notes.  Pertinent labs & imaging results that were available during my care of the patient were reviewed by me and considered in  my medical decision making (see chart for details).     Vitals WNL, pt overall stable, well appearing.  No shortness of breath, O2 94%.  molnupiravir prescribed as well as albuterol inhaler.  Pt will continue with supportive care. Return precautions discussed.  Final Clinical Impressions(s) / UC Diagnoses   Final diagnoses:  COVID  Cough     Discharge Instructions     Use albuterol inhaler as needed for shortness of breath.  Continue with Mucinex May take Ibuprofen as needed for body aches.   You are being prescribed MOLNUPIRAVIR for COVID-19 infection.    Please call the pharmacy or go through the drive through vs going inside if you are picking up the mediation yourself to prevent further spread. If prescribed to a [facility] affiliated pharmacy, a pharmacist will bring the medication out to your car.   ADMINISTRATION INSTRUCTIONS: 1. Take with or without food. Swallow the tablets whole. Don't chew, crush, or break the medications because it might not work as well  2. For each dose of the medication, you should be taking FOUR tablets at one time, TWICE a day   3. Finish your full five-day course of Molnupiravir even if you feel better before you're done. Stopping this medication too early can make it less effective to prevent severe illness related to [place].    4. Molnupiravir is prescribed for YOU ONLY. Don't share it with others, even if they have similar symptoms as you. This medication might not be right for everyone.   5. Make sure to take steps to protect yourself and others while you're taking this medication in order to get well soon and to prevent others from getting sick with COVID-19.     COMMON SIDE  EFFECTS: 1. Diarrhea 2. Nausea  3. Dizziness    If your COVID-19 symptoms get worse, get medical help right away. Call 911 if you experience symptoms such as worsening cough, trouble breathing, chest pain that doesn't go away, confusion, a hard time staying awake, and pale or blue-colored skin. This medication won't prevent all COVID-19 cases from getting worse.      ED Prescriptions    Medication Sig Dispense Auth. Provider   molnupiravir EUA 200 mg CAPS Take 4 capsules (800 mg total) by mouth 2 (two) times daily for 5 days. 40 capsule Carah Barrientes, PA-C   albuterol (VENTOLIN HFA) 108 (90 Base) MCG/ACT inhaler Inhale 1-2 puffs into the lungs every 6 (six) hours as needed for wheezing or shortness of breath. 1 each Konrad Felix, PA-C     PDMP not reviewed this encounter.   Konrad Felix, PA-C 04/11/21 1408

## 2021-04-11 NOTE — ED Triage Notes (Signed)
[name] tested positive for covid with home test.  Nurse at doctors office suggested patient come to [facility] to get medication specific to covid

## 2021-04-12 ENCOUNTER — Telehealth: Payer: Self-pay

## 2021-04-12 NOTE — Telephone Encounter (Signed)
[place] Night - Client TELEPHONE ADVICE RECORD [facility] Patient Name: [name] [facility] Gender: Male DOB: [birth year] Age: 75 Y 11 D Return Phone Number: [phone] (Primary) Address: City/ State/ Zip: [place] [address] Client [facility] Night - Client Client Site [place] Physician Waunita Schooner- MD Contact Type Call Who Is Calling Patient / Member / Family / Caregiver Call Type Triage / Clinical Caller Name [name] Relationship To Patient Spouse Return Phone Number [phone] (Primary) Chief Complaint Nasal Congestion Reason for Call Symptomatic / Request for Health Information Initial Comment CC Updated Symptoms - Caller's husband tested positive for covid. He has congestion and cough, no fever. Caller requesting medication. Translation No Nurse Assessment Nurse: Verita Schneiders, RN, April Date/Time (Eastern Time): 04/11/2021 10:12:50 AM Confirm and document reason for call. If symptomatic, describe symptoms. ---Caller's husband tested positive for COVID (dx on Saturday); symptoms began Thursday evening/Friday morning. He has sinus congestion and cough(wet), no fever. Does the patient have any new or worsening symptoms? ---Yes Will a triage be completed? ---Yes Related visit to physician within the last 2 weeks? ---No Does the PT have any chronic conditions? (i.e. diabetes, asthma, this includes High risk factors for pregnancy, etc.) ---Yes List chronic conditions. ---Parkinson's Is this a behavioral health or substance abuse call? ---No Guidelines Guideline Title Affirmed Question Affirmed Notes Nurse Date/Time (Eastern Time) COVID-19 - Diagnosed or Suspected [1] HIGH RISK for severe COVID complications (e.g., weak immune system, age > 35 years, obesity with BMI > 25, pregnant, chronic Beams, RN, April 04/11/2021 10:16:03 AM PLEASE NOTE: All timestamps contained within this  report are represented as [place] Standard Time. CONFIDENTIALTY NOTICE: This fax transmission is intended only for the addressee. It contains information that is legally privileged, confidential or otherwise protected from use or disclosure. If you are not the intended recipient, you are strictly prohibited from reviewing, disclosing, copying using or disseminating any of this information or taking any action in reliance on or regarding this information. If you have received this fax in error, please notify us immediately by telephone so that we can arrange for its return to [place]. Phone: [phone], Toll-Free: [phone], Fax: [phone] Page: 2 of 2 Call Id: [id number] Guidelines Guideline Title Affirmed Question Affirmed Notes Nurse Date/Time Eilene Ghazi Time) lung disease or other chronic medical condition) AND [2] COVID symptoms (e.g., cough, fever) (Exceptions: Already seen by PCP and no new or worsening symptoms.) Disp. Time Eilene Ghazi Time) Disposition Final User 04/11/2021 10:19:34 AM See HCP within 4 Hours (or PCP triage) Yes Beams, RN, April Disposition Overriden: Call PCP Now Override Reason: Patient's symptoms need a higher level of care Caller Disagree/Comply Comply Caller Understands Yes PreDisposition Call Doctor Care Advice Given Per Guideline SEE HCP (OR PCP TRIAGE) WITHIN 4 HOURS: * IF OFFICE WILL BE CLOSED AND NO PCP (PRIMARY CARE PROVIDER) SECOND-LEVEL TRIAGE: You need to be seen within the next 3 or 4 hours. A nearby Urgent Care Center [facility]) is often a good source of care. Another choice is to go to the ED. Go sooner if you become worse. GENERAL CARE ADVICE FOR COVID-19 SYMPTOMS: * The symptoms are generally treated the same whether you have COVID-19, influenza or some other respiratory virus. * Feeling dehydrated: Drink extra liquids. If the air in your home is dry, use a humidifier. CALL BACK IF: * You become worse CARE ADVICE given per COVID-19 - DIAGNOSED OR  SUSPECTED (Adult) guideline. Comments User: April, Beams, RN Date/Time (  Eastern Time): 04/11/2021 10:20:14 AM upgraded due to being higher risk population and request for medication (paxlovid) Referrals GO TO FACILITY UNDECIDED

## 2021-04-12 NOTE — Telephone Encounter (Signed)
Noted prescribed molnupiravir

## 2021-05-18 ENCOUNTER — Encounter: Payer: Self-pay | Admitting: Family Medicine

## 2021-05-18 DIAGNOSIS — L42 Pityriasis rosea: Secondary | ICD-10-CM

## 2021-05-31 DIAGNOSIS — H40023 Open angle with borderline findings, high risk, bilateral: Secondary | ICD-10-CM | POA: Diagnosis not present

## 2021-05-31 DIAGNOSIS — H2513 Age-related nuclear cataract, bilateral: Secondary | ICD-10-CM | POA: Diagnosis not present

## 2021-05-31 DIAGNOSIS — H43813 Vitreous degeneration, bilateral: Secondary | ICD-10-CM | POA: Diagnosis not present

## 2021-05-31 DIAGNOSIS — H524 Presbyopia: Secondary | ICD-10-CM | POA: Diagnosis not present

## 2021-07-29 DIAGNOSIS — R21 Rash and other nonspecific skin eruption: Secondary | ICD-10-CM | POA: Diagnosis not present

## 2021-07-29 DIAGNOSIS — D2271 Melanocytic nevi of right lower limb, including hip: Secondary | ICD-10-CM | POA: Diagnosis not present

## 2021-07-29 DIAGNOSIS — D2261 Melanocytic nevi of right upper limb, including shoulder: Secondary | ICD-10-CM | POA: Diagnosis not present

## 2021-07-29 DIAGNOSIS — D225 Melanocytic nevi of trunk: Secondary | ICD-10-CM | POA: Diagnosis not present

## 2021-07-29 DIAGNOSIS — T07XXXA Unspecified multiple injuries, initial encounter: Secondary | ICD-10-CM | POA: Diagnosis not present

## 2021-07-29 DIAGNOSIS — B351 Tinea unguium: Secondary | ICD-10-CM | POA: Diagnosis not present

## 2021-07-29 DIAGNOSIS — D2272 Melanocytic nevi of left lower limb, including hip: Secondary | ICD-10-CM | POA: Diagnosis not present

## 2021-07-29 DIAGNOSIS — L218 Other seborrheic dermatitis: Secondary | ICD-10-CM | POA: Diagnosis not present

## 2021-07-29 DIAGNOSIS — D2262 Melanocytic nevi of left upper limb, including shoulder: Secondary | ICD-10-CM | POA: Diagnosis not present

## 2021-12-05 DIAGNOSIS — H40051 Ocular hypertension, right eye: Secondary | ICD-10-CM | POA: Diagnosis not present

## 2021-12-29 ENCOUNTER — Ambulatory Visit: Payer: Medicare HMO | Admitting: Neurology

## 2021-12-29 ENCOUNTER — Encounter: Payer: Self-pay | Admitting: Neurology

## 2021-12-29 VITALS — BP 130/76 | HR 87 | Ht 74.0 in | Wt 248.0 lb

## 2021-12-29 DIAGNOSIS — G20 Parkinson's disease: Secondary | ICD-10-CM

## 2021-12-29 DIAGNOSIS — G4733 Obstructive sleep apnea (adult) (pediatric): Secondary | ICD-10-CM | POA: Diagnosis not present

## 2021-12-29 MED ORDER — PRAMIPEXOLE DIHYDROCHLORIDE 1 MG PO TABS: 1.0000 mg | ORAL_TABLET | Freq: Three times a day (TID) | ORAL | 3 refills | Status: DC

## 2021-12-29 MED ORDER — CARBIDOPA-LEVODOPA 25-100 MG PO TABS: 1.0000 | ORAL_TABLET | Freq: Three times a day (TID) | ORAL | 3 refills | Status: DC

## 2021-12-29 NOTE — Patient Instructions (Addendum)
It was nice to see you both again today.  As discussed, we will keep your Parkinson's medications the same. I renewed your prescriptions. I have reordered AutoPap therapy for your sleep apnea. I have requested referral to neuro rehab for evaluation through PT, OT and speech therapy. Please follow-up routinely for Parkinson's disease in 6 months, for your sleep apnea, once you have an AutoPap machine, we will need to see you back within 1 to 3 months.  We can make a virtual visit video based appointment through [facility] as well, with one of our nurse practitioners.

## 2021-12-29 NOTE — Progress Notes (Signed)
Subjective:    Patient ID: [name] is a 76 y.o. male.  HPI    Interim history:  [name] is a 76 year old right-handed gentleman with an underlying medical history of cellulitis, lymphedema, low back pain, hyperlipidemia, and mild obesity, who presents for follow-up consultation of his Parkinson's disease.  The patient is accompanied by his wife again today.  I first met him at the request of his primary care physician on 02/07/2021, at which time he reported parkinsonian symptoms dating back to late 2018.  He was on Sinemet 1 pill 3 times daily and pramipexole 1 mg 3 times daily.  He was advised to continue with his medication regimen.  He was sleepy during the day when pursuing sleep testing.  He had a baseline sleep study on 02/21/2021 which indicated overall mild obstructive sleep apnea with an AHI of 5.6/h, REM AHI was 30.4/h, supine AHI 10.5/h, average oxygen saturation 91% with a nadir of 83%.  He did have significant PLM's without significant arousals however.  He had frequent PACs.  He was advised to start AutoPap therapy to see if his sleepiness would improve.  Today, 12/29/2021: He reports feeling fairly stable.  He never actually started AutoPap therapy.  He was contacted months and advised of the delay he reports and never had a call back.  He did not call the DME company for an update either.  He never had evaluation through neuro rehab.  He reports that he never got a call back.  He is agreeable to pursuing therapy evaluation with PT, OT, ST through neuro rehab and agreeable to pursuing AutoPap therapy.  His wife reports that he seems to be sleepy during the day,per her observation, his gait and posture have become worse.  He has not had any recent falls.  He tries to hydrate well with water. He exercises on a regular basis and uses a stationary bike.  He drinks alcohol daily, 1 small glass per day.  The patient's allergies, current medications, family history, past medical history,  past social history, past surgical history and problem list were reviewed and updated as appropriate.   Previously:   02/07/21: (He) reports symptoms of Parkinson's disease dating back to late 2018 or early 2019.  He was diagnosed with Parkinson's disease in or around May 2019.  Initially, symptoms consisted of changes in his voice, changes noted in his facial expression, posture and gait changes.  He noticed a hand tremor, it was bilateral as far as he and his wife recall.  Symptoms were not necessarily worse on one side.  He is right-handed and noticed his tremor more on the right.  His mother had a diagnosis of essential tremor.  She lived to be 58, father died at 64.  Father had prostate cancer.  Patient has had changes in his posture over time and gait difficulty, has had some falls, last fall about a month or 2 ago.  He had significant daytime somnolence as part of his initial presentation but no evidence of dream enactment disorder.  He has never had a sleep study.  He does snore but snoring is better when he sleeps elevated with head of bed.  They have purchased an adjustable bed. He is retired, he is a Therapist, nutritional, worked as a Water quality scientist in college.  They moved to [facility].  They moved to [place] in November 2021.  They have a daughter in [place].  Sister also lives close by.  He smokes cigars  occasionally.  He drinks alcohol in the form of beer, 1 or 2/day on average.    He previously followed with a neurologist in [place].  I reviewed your office note from 11/25/2020.  He has been on Sinemet 25-100 mg strength 1 pill 3 times daily and Mirapex 1 mg strength 1 pill 3 times daily. I have reviewed records from his physician, Dr. Aniceto Boss.  His last visit was 07/21/2020, prior to that he was seen on 03/18/2020, at which time his Mirapex was increased from 0.5 mg 3 times daily to 1 mg 3 times daily.  He had a visit on 11/18/2019.  At his visit on 07/14/2019 his Mirapex was  0.25 mg 3 times daily and he was on Sinemet 25-100 mg strength 1 pill 3 times daily.  He was started on Azilect on 11/26/2018.  This was discontinued in May 2020 due to cost.  In October 2019 he was started on Requip and Rytary.  Rytary was discontinued due to cost.  He had side effects of drowsiness when he was on Requip.  He had a DaTscan which was positive for asymmetrically diminished uptake. He had an EEG on 08/24/2020 and I reviewed the report: Impression: Normal EEG during wakefulness.  He had a nuclear medicine DaTscan in [place], [place] on 07/23/2018 and I reviewed the results: Impression: #1 abnormal study. 2.  Asymmetrically diminished putamen activity compatible with Parkinson's disease or related syndrome.   He denies any significant orthostatic lightheadedness.  His wife has noticed a change in his posture.  He had physical therapy when in [place] which was helpful.  He tries to stay active, he is using a stationary bike.  He has occasional constipation.   He has had significant weight loss in the recent past.  He has not had any specific work-up for weight loss.  His wife does report that he eats slowly which is not a new trait.   His Past Medical History Is Significant For: Past Medical History:  Diagnosis Date   High cholesterol    Parkinson disease ([place])     His Past Surgical History Is Significant For: Past Surgical History:  Procedure Laterality Date   TONSILLECTOMY      His Family History Is Significant For: Family History  Problem Relation Age of Onset   Arthritis Mother    Hearing loss Mother    Hypertension Mother    Prostate cancer Father    Hearing loss Father    Heart attack Father    Hyperlipidemia Father    Hypertension Sister    Diabetes Daughter    Heart disease Maternal Grandmother    Hypertension Maternal Grandmother    Arthritis Maternal Grandmother     His Social History Is Significant For: Social History   Socioeconomic History    Marital status: Married    Spouse name: [name]   Number of children: 1   Years of education: master's degree   Highest education level: Not on file  Occupational History   Not on file  Tobacco Use   Smoking status: Former    Types: Pipe   Smokeless tobacco: Never  Vaping Use   Vaping Use: Never used  Substance and Sexual Activity   Alcohol use: Yes    Alcohol/week: 7.0 standard drinks    Types: 7 Standard drinks or equivalent per week    Comment: 1 per day   Drug use: Never   Sexual activity: Yes    Birth control/protection: Post-menopausal  Other Topics  Concern   Not on file  Social History Narrative   11/25/20   From: [place], but from this area, moved back 2022   Living: with wife, [name] [phone])   Work: retired Copywriter, advertising for [employer], [employer] Service, played piano      Family: Cristen in [place], 2 granddaughters       Enjoys: piano, read, crosswords      Exercise: planning to go to the [employer]   Diet: good, yogurt, cereal, big meal at lunchtime, light dinner      Safety   Seat belts: Yes    Guns: no   Safe in relationships: Yes    Social Determinants of Radio broadcast assistant Strain: Not on file  Food Insecurity: Not on file  Transportation Needs: Not on file  Physical Activity: Not on file  Stress: Not on file  Social Connections: Not on file    His Allergies Are:  No Known Allergies:   His Current Medications Are:  Outpatient Encounter Medications as of 12/29/2021  Medication Sig   aspirin 81 MG chewable tablet Chew 81 mg by mouth daily.   carbidopa-levodopa (SINEMET IR) 25-100 MG tablet Take 1 tablet by mouth 3 (three) times daily.   cetirizine (ZYRTEC ALLERGY) 10 MG tablet Take 1 tablet (10 mg total) by mouth daily. (Patient taking differently: Take 10 mg by mouth as needed.)   fenofibrate 160 MG tablet Take 1 tablet (160 mg total) by mouth daily.   furosemide (LASIX) 10 MG/ML solution Take 10 mg by mouth daily.    glucosamine-chondroitin 500-400 MG tablet Take 1 tablet by mouth daily.   Multiple Vitamins-Minerals (MULTIVITAMIN WITH MINERALS) tablet Take 1 tablet by mouth daily.   mupirocin ointment (BACTROBAN) 2 % Apply topically as needed.   pramipexole (MIRAPEX) 1 MG tablet Take 1 tablet (1 mg total) by mouth in the morning, at noon, and at bedtime.   albuterol (VENTOLIN HFA) 108 (90 Base) MCG/ACT inhaler Inhale 1-2 puffs into the lungs every 6 (six) hours as needed for wheezing or shortness of breath. (Patient not taking: Reported on 12/29/2021)   desonide (DESOWEN) 0.05 % cream Apply topically 2 (two) times daily. (Patient not taking: Reported on 12/29/2021)   No facility-administered encounter medications on file as of 12/29/2021.  :  Review of Systems:  Out of a complete 14 point review of systems, all are reviewed and negative with the exception of these symptoms as listed below:  Review of Systems  Neurological:        He is here with his wife for a yearly f/u. He never started his CPAP. He was told one wasn't available at that time and then he states he never heard back. He stoops a little bit more, he dozes off a lot. If he makes a quick turn he can lose his balance. His wife hasn't seen his hands shake at all. He has some issues with constipation off and on. He uses a stationary bike in the morning but is interested in physical therapy as well.    Objective:  Neurological Exam  Physical Exam Physical Examination:   Vitals:   12/29/21 1423  BP: 130/76  Pulse: 87    General Examination: The patient is a very pleasant 76 y.o. male in no acute distress. He appears well-developed and well-nourished and well groomed.   HEENT: Normocephalic, atraumatic, pupils are equal, round and reactive to light, extraocular tracking is mild to moderately impaired with saccadic breakdown, no obvious gaze limitation, maybe  mildly to upgaze.  He has moderate facial masking, moderate hypophonia, no significant  dysarthria or sialorrhea.  Hearing is grossly intact with bilateral hearing aids in place.  Normal facial sensation.  Airway examination reveals mild airway crowding, tongue protrudes centrally and palate elevates symmetrically, no carotid bruits.  Mild nuchal rigidity noted.  Neck position with mild forward stoop.   Chest: Clear to auscultation without wheezing, rhonchi or crackles noted.   Heart: S1+S2+0, regular and normal without murmurs, rubs or gallops noted.    Abdomen: Soft, non-tender and non-distended.   Extremities: There is trace edema in the left ankle, right distal lower extremity has 1+ pitting edema.  History of ankle injury in the past on the right.   Skin: Warm and dry without trophic changes noted.    Musculoskeletal: exam reveals no obvious joint deformities.    Neurologically:  Mental status: The patient is awake, alert and oriented in all 4 spheres. His immediate and remote memory, attention, language skills and fund of knowledge are appropriate. Thought process is linear. Mood is normal and affect is normal.  Cranial nerves II - XII are as described above under HEENT exam.  Motor exam: Normal bulk, global strength normal, mild increase in tone in the left upper extremity noted, without telltale cogwheeling, no resting tremor.    (On 02/07/21: Archimedes spiral drawing, he has minimal insecurity with the right hand which is his dominant hand, mild to moderate difficulty with mild trembling in the left upper extremity.)   He has no significant postural or action tremor.     Romberg is not tested due to safety concerns. Fine motor skills and coordination: He has mild difficulty globally with fine motor skills including finger taps, hand movements, foot taps.  Slightly more noticeable on the left compared to right.    Cerebellar testing: No dysmetria or intention tremor. There is no truncal or gait ataxia.  Sensory exam: intact to light touch in the upper and lower  extremities.  Gait, station and balance: He stands with mild difficulty, posture is moderately stooped, slightly worse.  He walks with a slight limp on the right.  He walks with a slight waddle.  He has decrease in arm swing, no walking aid, he turns with insecurity and has to hold onto the wall.  Balance is mildly impaired.     Assessment and Plan:    In summary, ODDIE BOTTGER is a very pleasant 76 year old male with an underlying medical history of cellulitis, lymphedema, low back pain, hyperlipidemia, mild sleep apnea, and mild obesity, who presents for follow-up consultation of his Parkinson's disease which was diagnosed in early 2019.  Symptoms dated back to late 2018. He had a DaTscan on 07/23/2018 which indicated an abnormal study with asymmetrically diminished putamen activity compatible with Parkinson's disease or related syndrome.  He has been on several different medications over time.  Some of the medications were cost prohibitive (Rytary and Azilect), and he had side effects with ropinirole (drowsiness).  He has been on generic Sinemet 25-100 mg strength 1 pill 3 times daily as well as pramipexole 1 mg 3 times daily.  He has had daytime somnolence.  Baseline sleep testing in April 2022 showed mild obstructive sleep apnea.  He is willing to embark on treatment with AutoPap therapy and I placed a new order.  He is advised to continue with his current medication regimen.  We talked about the importance of healthy lifestyle, good hydration, fall prevention.  He is  encouraged to start using a cane for gait safety for now using a walker.  We will seek evaluation through neuro rehab with PT, OT and ST.  I placed a referral for this.   He is advised to follow-up in this clinic routinely in about 6 months, sooner if needed.  We will also schedule a sleep follow-up in the interim once he gets started on AutoPap therapy.  I encouraged compliance with treatment.  I answered all their questions today and the  patient and his wife were in agreement. I spent 30 minutes in total face-to-face time and in reviewing records during pre-charting, more than 50% of which was spent in counseling and coordination of care, reviewing test results, reviewing medications and treatment regimen and/or in discussing or reviewing the diagnosis of PD, OSA, the prognosis and treatment options. Pertinent laboratory and imaging test results that were available during this visit with the patient were reviewed by me and considered in my medical decision making (see chart for details).

## 2022-01-02 ENCOUNTER — Telehealth: Payer: Self-pay | Admitting: *Deleted

## 2022-01-02 NOTE — Telephone Encounter (Signed)
PAP order, office note, sleep study, demographic/insurance info faxed to [facility]. Received a receipt of confirmation.

## 2022-01-04 ENCOUNTER — Encounter: Payer: Self-pay | Admitting: *Deleted

## 2022-01-04 NOTE — Telephone Encounter (Signed)
We received a fax from [facility] stating they are out of network with [facility].  I spoke with the patient's wife, [name] (on [place]), and discussed options. Also let her know that [facility] had stated they reached out to the patient on 08/31/21, 10/24/21 and 11/02/21 but never heard back. The pt's wife is agreeable to send the orders back to [facility] and her number is the main one on the chart for contact. I told her we were ordering a Resmed machine. She will call if she has any issues. Pt's wife was very Patent attorney. She understands the compliance requirements set forth by insurance which includes using the machine at least 4 hours a night and also being seen in the clinic between 31 and 90 days after start of therapy.  We went ahead and scheduled an appointment with Dr. Rexene Alberts for Monday, May 1 at 1:15 PM arrival 15 to 30 minutes early with machine and power cord.  Orders sent to [facility].  Letter mailed to patient.

## 2022-01-05 ENCOUNTER — Encounter: Payer: Self-pay | Admitting: Physical Therapy

## 2022-01-05 ENCOUNTER — Other Ambulatory Visit: Payer: Self-pay

## 2022-01-05 ENCOUNTER — Encounter: Payer: Self-pay | Admitting: Occupational Therapy

## 2022-01-05 ENCOUNTER — Ambulatory Visit: Payer: Medicare HMO | Admitting: Speech Pathology

## 2022-01-05 ENCOUNTER — Ambulatory Visit: Payer: Medicare HMO | Attending: Family Medicine | Admitting: Physical Therapy

## 2022-01-05 ENCOUNTER — Ambulatory Visit: Payer: Medicare HMO | Admitting: Occupational Therapy

## 2022-01-05 ENCOUNTER — Encounter: Payer: Self-pay | Admitting: Speech Pathology

## 2022-01-05 DIAGNOSIS — R2681 Unsteadiness on feet: Secondary | ICD-10-CM

## 2022-01-05 DIAGNOSIS — R278 Other lack of coordination: Secondary | ICD-10-CM | POA: Diagnosis not present

## 2022-01-05 DIAGNOSIS — R293 Abnormal posture: Secondary | ICD-10-CM

## 2022-01-05 DIAGNOSIS — R29818 Other symptoms and signs involving the nervous system: Secondary | ICD-10-CM

## 2022-01-05 DIAGNOSIS — R471 Dysarthria and anarthria: Secondary | ICD-10-CM | POA: Diagnosis not present

## 2022-01-05 DIAGNOSIS — R2689 Other abnormalities of gait and mobility: Secondary | ICD-10-CM

## 2022-01-05 NOTE — Therapy (Addendum)
[facility] [address] [place], [place], [address] Phone: [phone]   Fax:  [phone]  Physical Therapy Evaluation  Patient Details  Name: [name] MRN: [id number] Date of Birth: April 19, 1946 Referring Provider (PT): Dr. Rexene Alberts   Encounter Date: 01/05/2022   PT End of Session - 01/05/22 1246     Visit Number 1    Number of Visits 17   Plus eval   Date for PT Re-Evaluation 03/30/22    Authorization Type [employer]    Authorization Time Period Needs auth    PT Start Time 1146    PT Stop Time 1233    PT Time Calculation (min) 47 min    Equipment Utilized During Treatment Gait belt    Activity Tolerance Patient tolerated treatment well    Behavior During Therapy WFL for tasks assessed/performed             Past Medical History:  Diagnosis Date   High cholesterol    Parkinson disease ([place])     Past Surgical History:  Procedure Laterality Date   TONSILLECTOMY      There were no vitals filed for this visit.    Subjective Assessment - 01/05/22 1151     Subjective Pt reports he injured his RLE (cellulitis) in 2018 and had received PT in [place]. Moved to [place] in December of 2021.Reports he has difficulty w/quick turns, initiation, stability, posture, shuffling gait and balance. No falls. Does not use AD for gait and has been performing his PT exercises every day (not PD specific). Rides stationary bike about 30 minutes per day.    Patient is accompained by: Family member   Wife   Pertinent History PD (dx 2019), HLD, cellulitis, lymphedema, low back pain.  hearing loss    Limitations Standing;Walking    Patient Stated Goals Improve mobility so I can get around    Currently in Pain? No/denies                [facility] PT Assessment - 01/05/22 1156       Assessment   Medical Diagnosis PD    Referring Provider (PT) Dr. Rexene Alberts    Onset Date/Surgical Date 12/29/21   date of referral, dx in  2019   Hand Dominance Right    Prior Therapy PT for cellulitis in R[place] in [place]      Precautions   Precautions Fall      Restrictions   Weight Bearing Restrictions No      Balance Screen   Has the patient fallen in the past 6 months No    Has the patient had a decrease in activity level because of a fear of falling?  Yes    Is the patient reluctant to leave their home because of a fear of falling?  No      Home Environment   Living Environment Private residence    Living Arrangements Spouse/significant other    Type of [place] to enter   1 STE in front   Entrance Stairs-Number of Steps 1   No rails   Entrance Stairs-Rails None    Home Layout One level    Home Equipment --   Walk-in shower     Prior Function   Level of Independence Independent    Vocation Retired    Leisure Trivia, [place], group activities      Cognition   Overall Cognitive Status Within Functional Limits for  tasks assessed      Sensation   Light Touch Appears Intact      Coordination   Gross Motor Movements are Fluid and Coordinated No    Heel Shin Test Increased difficulty w/RLE >LLE      Posture/Postural Control   Posture/Postural Control Postural limitations    Postural Limitations Rounded Shoulders;Forward head;Increased thoracic kyphosis;Decreased lumbar lordosis;Posterior pelvic tilt      ROM / Strength   AROM / PROM / Strength Strength      Strength   Overall Strength Comments Grossly 5/5 MMT bilat.    Strength Assessment Site Hip;Knee;Ankle    Right/Left Hip --      Transfers   Transfers Sit to Stand;Stand to Sit    Sit to Stand 5: Supervision    Five time sit to stand comments  22.97 seconds, no UE support, incr forward flexed posture.    Stand to Sit 5: Supervision      Ambulation/Gait   Ambulation/Gait Yes    Ambulation/Gait Assistance 5: Supervision;4: Min guard    Assistive device None    Gait Pattern Step-through pattern;Decreased arm swing  - right;Decreased arm swing - left;Decreased stance time - left;Decreased stance time - right;Decreased dorsiflexion - right;Decreased dorsiflexion - left;Right foot flat;Left foot flat;Scissoring;Lateral hip instability;Decreased trunk rotation;Trunk flexed    Ambulation Surface Level;Indoor    Gait velocity 11.84s = 2.7ft/s with no AD      Standardized Balance Assessment   Standardized Balance Assessment Mini-BESTest;Timed Up and Go Test      Mini-BESTest   Sit To Stand Normal: Comes to stand without use of hands and stabilizes independently.    Rise to Toes < 3 s.    Stand on one leg (left) Severe: Unable    Stand on one leg (right) Severe: Unable    Stand on one leg - lowest score 0    Compensatory Stepping Correction - Forward Moderate: More than one step is required to recover equilibrium   Required two late steps to recover   Compensatory Stepping Correction - Backward No step, OR would fall if not caught, OR falls spontaneously.   Almost fall   Compensatory Stepping Correction - Left Lateral Severe: Falls, or cannot step   Almost fall forward   Compensatory Stepping Correction - Right Lateral Severe:  Falls, or cannot step   2 cross-over steps, almost fall to L side   Stepping Corredtion Lateral - lowest score 0    Stance - Feet together, eyes open, firm surface  Normal: 30s    Stance - Feet together, eyes closed, foam surface  Moderate: < 30s   in // bars for safety. 6s   Incline - Eyes Closed Moderate: Stands independently < 30s OR aligns with surface   Significant forward lean and kyphotic posture   Change in Gait Speed Moderate: Unable to change walking speed or signs of imbalance    Walk with head turns - Horizontal Moderate: performs head turns with reduction in gait speed.   Mild lateral gait deviations to ipsilateral side of head turn   Walk with pivot turns Moderate:Turns with feet close SLOW (>4 steps) with good balance.    Step over obstacles Moderate: Steps over box but  touches box OR displays cautious behavior by slowing gait.   Min A for stability   Timed UP & GO with Dual Task Moderate: Dual Task affects either counting OR walking (>10%) when compared to the TUG without Dual Task.    Mini-BEST total  score 12      Timed Up and Go Test   Normal TUG (seconds) 10.59   With no AD. Significant LOB to R side when turning to L, requring min A for safety   Manual TUG (seconds) 12.93   no AD, min guard   Cognitive TUG (seconds) 12.47   No AD, min guard for safety                       Objective measurements completed on examination: See above findings.                PT Education - 01/05/22 1236     Education Details POC, clinical assessment findings, co-pay, need for AD based on fall risk (will have to further determine which is best)    Person(s) Educated Patient;Spouse    Methods Explanation    Comprehension Verbalized understanding              PT Short Term Goals - 01/06/22 0806       PT SHORT TERM GOAL #1   Title Pt will be independent with initial HEP with wife's supervision in order to build upon functional gains made in therapy. ALL STGS DUE 02/03/22    Time 4    Period Weeks    Status New    Target Date 02/03/22      PT SHORT TERM GOAL #2   Title Pt will maintain gait speed of 2.8 ft/sec with LRAD with supervision in order to demo improved community ambulation.    Baseline 2.77 ft/sec with no AD - min guard.    Time 4    Period Weeks    Status New      PT SHORT TERM GOAL #3   Title Pt will improve 5x sit <> stand with no UE support to 19 seconds or less in order to demo improved functional strength/decr fall risk.    Baseline 22.97 seconds    Time 4    Period Weeks    Status New      PT SHORT TERM GOAL #4   Title Pt will ambulate at least 230' over level indoor surfaces with LRAD with supervision for improved safety with mobility.    Baseline no AD- needs min guard and min A for balance at times.     Time 4    Period Weeks    Status New      PT SHORT TERM GOAL #5   Title Pt and pt's wife will verbalize understanding of fall prevention in the home.    Time 4    Period Weeks    Status New               PT Long Term Goals - 01/06/22 0818       PT LONG TERM GOAL #1   Title Pt will be independent with final HEP with wife's supervision in order to build upon functional gains made in therapy.    Time 8    Period Weeks    Status New      PT LONG TERM GOAL #2   Title Pt will improve miniBEST to at least a 18/28 in order to demo decr fall risk.    Baseline 12/28    Time 8    Period Weeks    Status New      PT LONG TERM GOAL #3   Title Pt will improve 5x sit <> stand with no UE  support to 15 seconds or less in order to demo improved functional strength/decr fall risk.    Baseline 22.97 seconds    Time 8    Period Weeks    Status New      PT LONG TERM GOAL #4   Title Pt will ambulate at least 400' over outdoor paved surfaces with LRAD with mod I in order to demo improved community mobility.    Time 8    Period Weeks    Status New      PT LONG TERM GOAL #5   Title Pt will verbalize understanding of local Parkinson's disease resources,    Time 8    Period Weeks    Status New      Additional Long Term Goals   Additional Long Term Goals Yes      PT LONG TERM GOAL #6   Title Pt will recover posterior and anterior balance in push and release test in 2 steps independently, for improved balance recovery    Time 8    Period Weeks    Status New                    Plan - 01/05/22 1249     Clinical Impression Statement Patient is a 76 year old male referred to Neuro OPPT for PD (dx 2019). Pt's PMH is significant for: HLD, cellulitis, lymphedema, low back pain and hearing loss. The following deficits were present during the exam: significant imbalance, gait abnormalities w/min A needed for safety with turns and dynamic gait, kyphotic posture, impaired timing and  coordination and decreased functional strength/mobility. Based on MiniBest and 5 STS, pt is an incr risk for falls. Pt currently ambulating without AD increasing risk of falls. Pt would benefit from skilled PT to address these impairments and functional limitations to maximize functional mobility independence    Personal Factors and Comorbidities Age;Fitness;Past/Current Experience;Comorbidity 1;Time since onset of injury/illness/exacerbation    Comorbidities Cellulitis    Examination-Activity Limitations Bend;Carry;Reach Overhead;Locomotion Level;Lift;Stairs;Squat;Stand;Transfers    Examination-Participation Restrictions Community Activity    Stability/Clinical Decision Making Evolving/Moderate complexity    Clinical Decision Making Moderate    Rehab Potential Good    PT Frequency 2x / week    PT Duration 12 weeks    PT Treatment/Interventions ADLs/Self Care Home Management;Gait training;DME Instruction;Stair training;Functional mobility training;Therapeutic activities;Therapeutic exercise;Balance training;Neuromuscular re-education;Cognitive remediation;Patient/family education;Energy conservation;Vestibular    PT Next Visit Plan Establish initial HEP, stepping strategies, anterior weight shifting, postural control, gait w/RW    Consulted and Agree with Plan of Care Patient;Family member/caregiver    Family Member Consulted Wife, [name]             Patient will benefit from skilled therapeutic intervention in order to improve the following deficits and impairments:  Abnormal gait, Decreased balance, Decreased endurance, Decreased mobility, Difficulty walking, Decreased cognition, Decreased range of motion, Impaired perceived functional ability, Improper body mechanics, Decreased activity tolerance, Decreased coordination, Decreased knowledge of use of DME, Decreased safety awareness, Decreased strength, Impaired flexibility, Postural dysfunction  Visit Diagnosis: Unsteadiness on  feet  Other abnormalities of gait and mobility  Abnormal posture     Problem List Patient Active Problem List   Diagnosis Date Noted   Parkinson's disease ([place]) 11/25/2020   Edema of right lower leg 11/25/2020   Hyperlipidemia 11/25/2020   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

## 2022-01-05 NOTE — Patient Instructions (Addendum)
Loud "ah" using diaphragmatic breath 5x 2x a day. Voice should be loud.   Practice reading using 3 out of 10 vocal intensity.   Think about powering your voice with deep breath, then speak with intent. Remember that when putting forth effort when you speak clears your voice and actually brings vocal volume to normal level for your listener.  Vocal Hygiene: Drink plenty of water!! Try for 1 additional cup a day

## 2022-01-05 NOTE — Therapy (Signed)
[name], [id number] Phone: [phone]   Fax:  [phone]  Speech Language Pathology Evaluation  Patient Details  Name: [name] MRN: [id number] Date of Birth: 07-12-1946 Referring Provider (SLP): Dr. Rexene Alberts   Encounter Date: 01/05/2022   End of Session - 01/05/22 1142     Visit Number 1    Number of Visits 25    Date for SLP Re-Evaluation 04/07/22    SLP Start Time 1100    SLP Stop Time  1145    SLP Time Calculation (min) 45 min    Activity Tolerance Patient tolerated treatment well             Past Medical History:  Diagnosis Date   High cholesterol    Parkinson disease ([place])     Past Surgical History:  Procedure Laterality Date   TONSILLECTOMY      There were no vitals filed for this visit.   Subjective Assessment - 01/05/22 1104     Subjective "doing good"    Patient is accompained by: Family member   wife, [name]   Currently in Pain? No/denies                SLP Evaluation [facility] - 01/05/22 1104       SLP Visit Information   SLP Received On 01/05/22    Referring Provider (SLP) Dr. Rexene Alberts    Onset Date 2018    Medical Diagnosis Parkinsons      Subjective   Patient/Family Stated Goal be understood      General Information   HPI Pt began experiencing parkinson's symptoms in 2018.    Mobility Status abulates w/o assistance      Balance Screen   Has the patient fallen in the past 6 months No    Has the patient had a decrease in activity level because of a fear of falling?  Yes    Is the patient reluctant to leave their home because of a fear of falling?  Yes   has PT eval pending     Prior Functional Status   Type of Home House     Lives With Spouse    Available Support Family    Education doctoral degree    Vocation Retired      Associate Professor   Overall Cognitive Status Within Functional Limits for tasks assessed   suspect slowed  processing consistant with Parksinsons     Auditory Comprehension   Overall Auditory Comprehension Appears within functional limits for tasks assessed      Verbal Expression   Overall Verbal Expression Appears within functional limits for tasks assessed      Motor Speech   Overall Motor Speech Impaired    Respiration Impaired    Level of Impairment Phrase    Phonation Hoarse;Low vocal intensity    Resonance Within functional limits    Articulation Within functional limitis    Intelligibility Intelligibility reduced    Conversation 75-100% accurate    Phonation Impaired    Vocal Abuses Habitual Cough/Throat Clear    Volume Soft    Pitch Appropriate                               SLP Short Term Goals - 01/05/22 1138       SLP SHORT TERM GOAL #1   Title pt will produce loud "ah" averaging 90 dB over  5 trials with rare min A across 2 sessions    Time 4    Period Weeks    Status New      SLP SHORT TERM GOAL #2   Title pt will average 70-72 dB across 5 minute conversational sample    Time 4    Period Weeks    Status New      SLP SHORT TERM GOAL #3   Title pt with average 70-72 dB at sentence level 18/20 sentences over 2 sessions    Time 4    Period Weeks    Status New      SLP SHORT TERM GOAL #4   Title pt will have 10 or less throat clears over 45 minute session    Time 4    Period Weeks    Status New              SLP Long Term Goals - 01/05/22 1208       SLP LONG TERM GOAL #1   Title pt will average 90 dB with x5 loud "ah" mod I over 3 sessions    Time 12    Period Weeks    Status New      SLP LONG TERM GOAL #2   Title pt will average 70-72 dB over 20 minute conversation with rare min A    Time 12    Period Weeks    Status New      SLP LONG TERM GOAL #3   Title pt/spouse report 50% decrease in requests for repetition subjectively at home    Time 12    Period Weeks    Status New      SLP LONG TERM GOAL #4   Title pt/spouse  will report compliance with vocal hygiene recommendations over 1 week period    Time 12    Period Weeks    Status New              Plan - 01/05/22 1136     Clinical Impression Statement [name] has Parkinson's and was referred for ST. Pt attends evaluation with his wife, [name]. Pt and wife endorse pt's low volume impacting communication in the home. Throughout evaluation, [name] present with reduced vocal intensity, averaging in low 60s dB. Pt completes Communication Participation Item Bank, scoring a total of 25/30. Pt reports "a little" difficulty communication quickly, talking with people he does not know, asking questions in conversations, giving detailed information, and getting a turn in a fast paced conversation.   Measured when a sound level meter was placed 30 cm away from pt's mouth, 5 minutes of conversational speech was reduced today, at average 63 dB (WNL= average 70-72dB) with range of 61-64 dB. Overall speech intelligibility for this listener in a quiet environment was not affected, at approx 100%. Production of loud /a/ averaged 82 dB (range of 80 to 83) and usual mod cues needed for loudness.   Pt then rated effort level at 3/10 for production of loud /a/ (10=maximal effort). In oral reading task pt was asked to use the same amount of effort as with loud /a/. Loudness average with this increased effort was 72 dB (range of 70 to 74) with rare mod A for loudness. Pt would benefit from skilled ST in order to improve speech intelligibility and pt's QOL. Increasing perceived effort and volume was effective in reducing hoarseness of voice. Benefits from cues to use vocal pitch, vs. deeper pitch.   Throat clearing observed  throughout session, x16 across 20-minute period.    Speech Therapy Frequency 2x / week    Duration 12 weeks    Treatment/Interventions Functional tasks;Cueing hierarchy;Environmental controls;Patient/family education;SLP instruction and  feedback;Internal/external aids;Compensatory strategies;Compensatory techniques    Potential to Achieve Goals Good    Potential Considerations Medical prognosis    SLP Home Exercise Plan loud "ah"; oral reading    Consulted and Agree with Plan of Care Patient;Family member/caregiver    Family Member Consulted wife, [name]             Patient will benefit from skilled therapeutic intervention in order to improve the following deficits and impairments:   Dysarthria    Problem List Patient Active Problem List   Diagnosis Date Noted   Parkinson's disease ([place]) 11/25/2020   Edema of right lower leg 11/25/2020   Hyperlipidemia 11/25/2020   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 [place], [place], [address] Phone: [phone]   Fax:  [phone]  Name: [name] MRN: [id number] Date of Birth: 13-Jul-1946

## 2022-01-06 ENCOUNTER — Encounter: Payer: Medicare HMO | Admitting: Occupational Therapy

## 2022-01-06 ENCOUNTER — Encounter: Payer: Self-pay | Admitting: Occupational Therapy

## 2022-01-06 NOTE — Therapy (Signed)
[facility] [address] [place], [place], [address] Phone: [phone]   Fax:  [phone]  Occupational Therapy Evaluation  Patient Details  Name: [name] MRN: [id number] Date of Birth: October 29, 1946 Referring Provider (OT): Dr. Rexene Alberts   Encounter Date: 01/05/2022   OT End of Session - 01/06/22 0756     Visit Number 1    Number of Visits 25    Date for OT Re-Evaluation 03/31/22    Authorization Type [employer]    Authorization Time Period 60 days- POC written for 12 weeks anticipate d/c following 6-8 weeks    Authorization - Visit Number 1    Authorization - Number of Visits 10    Progress Note Due on Visit 10    OT Start Time 1234    OT Stop Time 1315    OT Time Calculation (min) 41 min    Behavior During Therapy [facility] for tasks assessed/performed             Past Medical History:  Diagnosis Date   High cholesterol    Parkinson disease ([place])     Past Surgical History:  Procedure Laterality Date   TONSILLECTOMY      There were no vitals filed for this visit.   Subjective Assessment - 01/05/22 1237     Subjective  Denies pain    Pertinent History PD diganosed in 2019   cellulitis, lymphedema, low back pain, hyperlipidemia,    Patient Stated Goals mobility, maintain independence    Currently in Pain? No/denies               [facility] OT Assessment - 01/05/22 1240       Assessment   Medical Diagnosis PD    Referring Provider (OT) Dr. Rexene Alberts    Onset Date/Surgical Date 12/29/21   diagnosed in 2019   Hand Dominance Right      Precautions   Precautions Fall      Restrictions   Weight Bearing Restrictions No      Balance Screen   Has the patient fallen in the past 6 months No    Has the patient had a decrease in activity level because of a fear of falling?  Yes    Is the patient reluctant to leave their home because of a fear of falling?  Yes      Home  Environment   Family/patient expects  to be discharged to: Private residence    Living Arrangements Spouse/significant other    Type of [place] Access --   1 step   Bathroom Building control surveyor   no grab bars , has seat   Lives With Spouse      Prior Function   Level of Independence Independent    Vocation Retired    Leisure Neurosurgeon, Engineer, structural, group activities      ADL   Eating/Feeding Modified independent    Grooming Modified independent    Upper Body Bathing Modified independent    [place] independent    Upper Body Dressing Needs assist for fasteners;Increased time    Lower Body Dressing Increased time   wears compression stockings   Toileting -  Hygiene Supervision/safety    ADL comments Pt does not have grab bars in shower, it has a glass enclosure.      IADL   Light Housekeeping Performs light daily tasks such as dishwashing, bed making    Meal Prep Able to complete  simple cold meal and snack prep    Medication Management Takes responsibility if medication is prepared in advance in seperate dosage    Financial Management Manages financial matters independently (budgets, writes checks, pays rent, bills goes to bank), collects and keeps track of income      Mobility   Mobility Status --   fall risk, modI to supervision     Written Expression   Dominant Hand Right   Pt's wife reports micrographia when pt writes for longer time periods   Handwriting Mild micrographia;100% legible      Vision - History   Baseline Vision Wears glasses all the time      Vision Assessment   Vision Assessment Vision not tested      Cognition   Overall Cognitive Status Within Functional Limits for tasks assessed      Observation/Other Assessments   Standing Functional Reach Test R 12, L 10.5    Other Surveys  Select    Simulated Eating Comments 14.19    Donning Doffing Jacket Time (seconds) 13.66    Donning Doffing Jacket Comments 3 button 25.37      Posture/Postural Control    Posture/Postural Control Postural limitations    Postural Limitations Rounded Shoulders;Forward head;Increased thoracic kyphosis;Decreased lumbar lordosis;Posterior pelvic tilt      Sensation   Light Touch Appears Intact      Coordination   Gross Motor Movements are Fluid and Coordinated No    Fine Motor Movements are Fluid and Coordinated No    9 Hole Peg Test Right;Left    Right 9 Hole Peg Test 38.25    Left 9 Hole Peg Test 33.75    Box and Blocks RUE 45 LUE 38      ROM / Strength   AROM / PROM / Strength AROM      AROM   Overall AROM  Deficits    Overall AROM Comments RUE shoulder flexion 125 elbow extension -25 elbow,  decreased supination, LUE shoulder flexion 125, elbow exresntion [facility]                                OT Short Term Goals - 01/06/22 4481       OT SHORT TERM GOAL #1   Title I with PD specific HEP    Time 4    Period Weeks    Status New    Target Date 02/03/22      OT SHORT TERM GOAL #2   Title Pt will verbalize understanding of adapted strategies to maximize safety and I with ADLs/ IADLs .    Time 4    Period Weeks    Status New      OT SHORT TERM GOAL #3   Title Pt will demonstrate improved fine motor coordination for ADLs as evidenced by decreasing 9 hole peg test score for RUE by 3 secs    Baseline R 38.25, L 33.75    Time 4    Period Weeks    Status New      OT SHORT TERM GOAL #4   Title Pt will demonstrated improved LUE functional use as evidenced by increasing box/ blocks by 3 blocks    Baseline R 45, L 39    Time 4    Period Weeks    Status New      OT SHORT TERM GOAL #5   Title Pt will verbalize understanding of ways to  keep thinking skills sharp.    Time 4    Period Weeks    Status New               OT Long Term Goals - 01/06/22 0809       OT LONG TERM GOAL #1   Title Pt will verbalize understanding of ways to prevent future PD related complications and PD community resources.    Time 12     Period Weeks    Status New    Target Date 03/31/22      OT LONG TERM GOAL #2   Title Pt will demonstrate improved ease with feeding as evidenced by decreasing PPT#2 (self feeding) by 3 secs    Baseline 14.19 secs    Time 12    Period Weeks    Status New      OT LONG TERM GOAL #3   Title Pt will retrieve an item at 130 shoulder flexion and -20 elbow extension with RUE.    Baseline 125, -25    Time 12    Period Weeks    Status New      OT LONG TERM GOAL #4   Title Pt will demonstrate ability to write a short paragraph with 100% legibility and no significant decrease in letter size.    Time 12    Period Weeks    Status New                   Plan - 01/06/22 0752     Clinical Impression Statement Patient is a 75 year old male referred to OT for PD (dx 2019) with  PMH: HLD, cellulitis, lymphedema, low back pain and hearing loss. Pt has not received PD specific OT before.Pt moved from [place] to [place] in 2021. Pt has family in the area. Pt presents with the following deficits; bradykinesia, decreased coordaintion, abnormal posture, decreased blaance, decreased functional mobility which impedes performance of ADLS/IADLS. Pt can benefit from skilled occupational therpay to address these deficits in order to maximize pt's safety and I with daily activities.    OT Occupational Profile and History Detailed Assessment- Review of Records and additional review of physical, cognitive, psychosocial history related to current functional performance    Occupational performance deficits (Please refer to evaluation for details): ADL's;IADL's;Leisure;Play;Social Participation    Body Structure / Function / Physical Skills ADL;Balance;Endurance;Mobility;Strength;Flexibility;UE functional use;FMC;Gait;Coordination;ROM;GMC;Decreased knowledge of precautions;Decreased knowledge of use of DME;IADL;Dexterity    Rehab Potential Good    Clinical Decision Making Limited treatment options, no task  modification necessary    Comorbidities Affecting Occupational Performance: May have comorbidities impacting occupational performance    Modification or Assistance to Complete Evaluation  No modification of tasks or assist necessary to complete eval    OT Frequency 2x / week   plus eval, POC written for 12 weeks anticipate d/c after 6-8 weeks   OT Duration 12 weeks    OT Treatment/Interventions Self-care/ADL training;Therapeutic exercise;Functional Mobility Training;Balance training;Aquatic Therapy;Ultrasound;Neuromuscular education;Splinting;Manual Therapy;Therapeutic activities;DME and/or AE instruction;Cognitive remediation/compensation;Gait Training;Moist Heat;Fluidtherapy;Paraffin;Cryotherapy;Passive range of motion;Patient/family education    Plan PWR! moves in seated or supine, coordiantion HEP    Consulted and Agree with Plan of Care Patient;Family member/caregiver             Patient will benefit from skilled therapeutic intervention in order to improve the following deficits and impairments:   Body Structure / Function / Physical Skills: ADL, Balance, Endurance, Mobility, Strength, Flexibility, UE functional use, FMC, Gait, Coordination, ROM, GMC,  Decreased knowledge of precautions, Decreased knowledge of use of DME, IADL, Dexterity       Visit Diagnosis: Other symptoms and signs involving the nervous system - Plan: Ot plan of care cert/re-cert  Unsteadiness on feet - Plan: Ot plan of care cert/re-cert  Other abnormalities of gait and mobility - Plan: Ot plan of care cert/re-cert  Abnormal posture - Plan: Ot plan of care cert/re-cert  Other lack of coordination - Plan: Ot plan of care cert/re-cert    Problem List Patient Active Problem List   Diagnosis Date Noted   Parkinson's disease ([place]) 11/25/2020   Edema of right lower leg 11/25/2020   Hyperlipidemia 11/25/2020   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 [place], [place], [address] Phone: [phone]   Fax:  [phone]  Name: [name] MRN: [id number] Date of Birth: 11-08-1946

## 2022-01-06 NOTE — Addendum Note (Signed)
Addended by: Arliss Journey on: 01/06/2022 08:28 AM   Modules accepted: Orders

## 2022-01-09 NOTE — Telephone Encounter (Signed)
[facility] confirmed receipt of order and will pt setup.

## 2022-01-10 ENCOUNTER — Ambulatory Visit: Payer: Medicare HMO | Admitting: Physical Therapy

## 2022-01-10 ENCOUNTER — Ambulatory Visit: Payer: Medicare HMO | Admitting: Speech Pathology

## 2022-01-10 ENCOUNTER — Other Ambulatory Visit: Payer: Self-pay

## 2022-01-10 ENCOUNTER — Ambulatory Visit: Payer: Medicare HMO | Admitting: Occupational Therapy

## 2022-01-10 DIAGNOSIS — R2681 Unsteadiness on feet: Secondary | ICD-10-CM

## 2022-01-10 DIAGNOSIS — R293 Abnormal posture: Secondary | ICD-10-CM | POA: Diagnosis not present

## 2022-01-10 DIAGNOSIS — R471 Dysarthria and anarthria: Secondary | ICD-10-CM | POA: Diagnosis not present

## 2022-01-10 DIAGNOSIS — R2689 Other abnormalities of gait and mobility: Secondary | ICD-10-CM

## 2022-01-10 DIAGNOSIS — R29818 Other symptoms and signs involving the nervous system: Secondary | ICD-10-CM

## 2022-01-10 DIAGNOSIS — R278 Other lack of coordination: Secondary | ICD-10-CM | POA: Diagnosis not present

## 2022-01-10 NOTE — Therapy (Addendum)
[facility] [address] [place], [place], [address] Phone: [phone]   Fax:  [phone]  Physical Therapy Treatment  Patient Details  Name: [name] MRN: [id number] Date of Birth: 15-Oct-1946 Referring Provider (PT): Dr. Rexene Alberts   Encounter Date: 01/10/2022   PT End of Session - 01/10/22 1319     Visit Number 2    Number of Visits 17   Plus eval   Date for PT Re-Evaluation 03/30/22    Authorization Type [employer]    Authorization Time Period Needs auth    PT Start Time 1230    PT Stop Time 1314    PT Time Calculation (min) 44 min    Equipment Utilized During Treatment Gait belt    Activity Tolerance Patient tolerated treatment well    Behavior During Therapy WFL for tasks assessed/performed             Past Medical History:  Diagnosis Date   High cholesterol    Parkinson disease ([place])     Past Surgical History:  Procedure Laterality Date   TONSILLECTOMY      There were no vitals filed for this visit.   Subjective Assessment - 01/10/22 1233     Subjective Pt brought old HEP to review during therapy. No new changes    Patient is accompained by: Family member   Wife   Pertinent History PD (dx 2019), HLD, cellulitis, lymphedema, low back pain.  hearing loss    Limitations Standing;Walking    Patient Stated Goals Improve mobility so I can get around    Currently in Pain? No/denies                               [facility] Adult PT Treatment/Exercise - 01/10/22 0001       Transfers   Transfers Sit to Stand;Stand to Sit    Sit to Stand 5: Supervision    Sit to Stand Details Verbal cues for technique;Verbal cues for precautions/safety      Ambulation/Gait   Ambulation/Gait Yes    Ambulation/Gait Assistance 5: Supervision    Ambulation/Gait Assistance Details Min-mod verbal and tactile cues for safe management of RW, as pt tends to push RW away from him, resulting in  worsening of posture and decreased step clearance bilaterally    Ambulation Distance (Feet) 230 Feet    Assistive device Rolling walker    Gait Pattern Step-through pattern;Decreased stance time - left;Decreased stance time - right;Decreased dorsiflexion - right;Decreased dorsiflexion - left;Scissoring;Lateral hip instability;Trunk flexed;Shuffle    Ambulation Surface Level;Indoor            Reviewed and demonstrated HEP w/S* as described below. Mod-max tactile and visual cues provided as pt demonstrates poor body awareness and does not respond to verbal cues well.   Access Code: [email or web address] URL: [email or web address] Date: 01/10/2022 Prepared by: Mickie Bail Jamarion [place]  Exercises Sit to stand with band pull-apart - 1 x daily - 7 x weekly - 3 sets - 10 reps Step Sideways with Arms Reaching - 1 x daily - 7 x weekly - 3 sets - 10 reps Side Stomps - 1 x daily - 7 x weekly - 3 sets - 10 reps Backward Walking with Counter Support - 1 x daily - 7 x weekly - 3 sets - 10 reps Seated Windmill Trunk Rotation Stretch - 1 x daily - 7 x weekly - 3 sets - 10 reps  PT Education - 01/10/22 1328     Education Details Initial HEP, obtaining RW for reduced fall risk with gait, initiation of walking program when he obtains RW    Person(s) Educated Patient;Spouse    Methods Explanation;Demonstration;Handout    Comprehension Verbalized understanding              PT Short Term Goals - 01/06/22 0806       PT SHORT TERM GOAL #1   Title Pt will be independent with initial HEP with wife's supervision in order to build upon functional gains made in therapy. ALL STGS DUE 02/03/22    Time 4    Period Weeks    Status New    Target Date 02/03/22      PT SHORT TERM GOAL #2   Title Pt will maintain gait speed of 2.8 ft/sec with LRAD with supervision in order to demo improved community ambulation.    Baseline 2.77 ft/sec with no AD - min guard.    Time 4    Period Weeks    Status New       PT SHORT TERM GOAL #3   Title Pt will improve 5x sit <> stand with no UE support to 19 seconds or less in order to demo improved functional strength/decr fall risk.    Baseline 22.97 seconds    Time 4    Period Weeks    Status New      PT SHORT TERM GOAL #4   Title Pt will ambulate at least 230' over level indoor surfaces with LRAD with supervision for improved safety with mobility.    Baseline no AD- needs min guard and min A for balance at times.    Time 4    Period Weeks    Status New      PT SHORT TERM GOAL #5   Title Pt and pt's wife will verbalize understanding of fall prevention in the home.    Time 4    Period Weeks    Status New               PT Long Term Goals - 01/06/22 0818       PT LONG TERM GOAL #1   Title Pt will be independent with final HEP with wife's supervision in order to build upon functional gains made in therapy. ALL LTGS DUE 03/03/22    Time 8    Period Weeks    Status New    Target Date 03/03/22      PT LONG TERM GOAL #2   Title Pt will improve miniBEST to at least a 18/28 in order to demo decr fall risk.    Baseline 12/28    Time 8    Period Weeks    Status New      PT LONG TERM GOAL #3   Title Pt will improve 5x sit <> stand with no UE support to 15 seconds or less in order to demo improved functional strength/decr fall risk.    Baseline 22.97 seconds    Time 8    Period Weeks    Status New      PT LONG TERM GOAL #4   Title Pt will ambulate at least 400' over outdoor paved surfaces with LRAD with mod I in order to demo improved community mobility.    Time 8    Period Weeks    Status New      PT LONG TERM GOAL #5   Title Pt  will verbalize understanding of local Parkinson's disease resources,    Time 8    Period Weeks    Status New      Additional Long Term Goals   Additional Long Term Goals Yes      PT LONG TERM GOAL #6   Title Pt will recover posterior and anterior balance in push and release test in 2 steps  independently, for improved balance recovery    Time 8    Period Weeks    Status New                   Plan - 01/10/22 1321     Clinical Impression Statement Emphasis of session on establishing initial HEP and gait training with RW. Pt demonstrates reduced kyphosis and improved step clearance with use of RW during gait but requires min verbal cues for safe AD management as he tends to walk outside of RW. Pt continues to demonstrate difficulty with lateral weight shifting and LOB w/turning. Pt is progressing towards LTGs, continue POC.    Personal Factors and Comorbidities Age;Fitness;Past/Current Experience;Comorbidity 1;Time since onset of injury/illness/exacerbation    Comorbidities Cellulitis    Examination-Activity Limitations Bend;Carry;Reach Overhead;Locomotion Level;Lift;Stairs;Squat;Stand;Transfers    Examination-Participation Restrictions Community Activity    Stability/Clinical Decision Making Evolving/Moderate complexity    Rehab Potential Good    PT Frequency 2x / week    PT Duration 12 weeks    PT Treatment/Interventions ADLs/Self Care Home Management;Gait training;DME Instruction;Stair training;Functional mobility training;Therapeutic activities;Therapeutic exercise;Balance training;Neuromuscular re-education;Cognitive remediation;Patient/family education;Energy conservation;Vestibular    PT Next Visit Plan Gait velocity w/RW, stepping strategies, sit <>stand variations, dual-tasking    PT Home Exercise Plan [email or web address]    Consulted and Agree with Plan of Care Patient;Family member/caregiver    Family Member Consulted Wife, [name]             Patient will benefit from skilled therapeutic intervention in order to improve the following deficits and impairments:  Abnormal gait, Decreased balance, Decreased endurance, Decreased mobility, Difficulty walking, Decreased cognition, Decreased range of motion, Impaired perceived functional ability, Improper body mechanics,  Decreased activity tolerance, Decreased coordination, Decreased knowledge of use of DME, Decreased safety awareness, Decreased strength, Impaired flexibility, Postural dysfunction  Visit Diagnosis: Unsteadiness on feet  Other abnormalities of gait and mobility  Abnormal posture     Problem List Patient Active Problem List   Diagnosis Date Noted   Parkinson's disease ([place]) 11/25/2020   Edema of right lower leg 11/25/2020   Hyperlipidemia 11/25/2020   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 [place], [place], [address] Phone: [phone]   Fax:  [phone]  Name: [name] MRN: [id number] Date of Birth: [birth year]

## 2022-01-10 NOTE — Patient Instructions (Signed)
Coordination Exercises  Perform the following exercises for 20 minutes 1 times per day. Perform with both hand(s). Perform using big movements.  Flipping Cards: Place deck of cards on the table. Flip cards over by opening your hand big to grasp and then turn your palm up big. Deal cards: Hold 1/2 or whole deck in your hand. Use thumb to push card off top of deck with one big push. Rotate ball with fingertips: Pick up with fingers/thumb and move as much as you can with each turn/movement (clockwise and counter-clockwise). Toss ball from one hand to the other: Toss big/high. Toss ball in the air and catch with the same hand: Toss big/high. Pick up coins and stack one at a time: Pick up with big, intentional movements. Do not drag coin to the edge. (5-10 in a stack) Pick up 5-10 coins one at a time and hold in palm. Then, move coins from palm to fingertips one at time and place in coin bank/container. Perform "Flicks"/hand stretches (PWR! Hands): Close hands then flick out your fingers with focus on opening hands, pulling wrists back, and extending elbows like you are pushing.

## 2022-01-10 NOTE — Therapy (Signed)
[facility] [address] [place], [place], [address] Phone: [phone]   Fax:  [phone]  Speech Language Pathology Treatment  Patient Details  Name: [name] MRN: [id number] Date of Birth: 10-08-1946 Referring Provider (SLP): Dr. Rexene Alberts   Encounter Date: 01/10/2022   End of Session - 01/10/22 1301     Visit Number 2    Number of Visits 25    Date for SLP Re-Evaluation 04/07/22    SLP Start Time 1400    SLP Stop Time  1445    SLP Time Calculation (min) 45 min    Activity Tolerance Patient tolerated treatment well             Past Medical History:  Diagnosis Date   High cholesterol    Parkinson disease ([place])     Past Surgical History:  Procedure Laterality Date   TONSILLECTOMY      There were no vitals filed for this visit.   Subjective Assessment - 01/10/22 1402     Subjective "I hope the neighbors don't hear me when I'm doing my loud ahs"    Patient is accompained by: Family member    Currently in Pain? No/denies                   ADULT SLP TREATMENT - 01/10/22 1303       General Information   Behavior/Cognition Alert;Cooperative;Pleasant mood      Treatment Provided   Treatment provided Cognitive-Linquistic      Cognitive-Linquistic Treatment   Treatment focused on Dysarthria;Voice    Skilled Treatment [name] reports to complaince with voice HEP, tells ST that he has had some instances of "froggy" voice but that has cleared up since using a more neutral pitch. ST provides education and feedback on pt's pitch during "ah" practice this date. Pt performs x5 loud "ah", average 87 dB. With usual mod A at phrase level, pt averaging 70 dB across x10 trials. Pt endorses difficulty with discrimination regarding his intensity 2/2 having his hearing aids. Spontanous speech sample- sentence level responses, given usual mod A, to include visual cues, pt averages 64 dB. Pt's intensity  observed to decrease at end of utterances. Provide pt with education and handout on reducing throat clearing. Has limited awareness of throat clears this date.      Assessment / Recommendations / Plan   Plan Continue with current plan of care      Progression Toward Goals   Progression toward goals Progressing toward goals              SLP Education - 01/10/22 1502     Education Details throat clearing cessation techniques; order speak out workbook    Person(s) Educated Patient    Methods Explanation;Handout;Demonstration    Comprehension Verbalized understanding;Returned demonstration;Need further instruction              SLP Short Term Goals - 01/10/22 1506       SLP SHORT TERM GOAL #1   Title pt will produce loud "ah" averaging 90 dB over 5 trials with rare min A across 2 sessions    Time 3    Period Weeks    Status On-going      SLP SHORT TERM GOAL #2   Title pt will average 70-72 dB across 5 minute conversational sample    Time 3    Period Weeks    Status On-going      SLP SHORT TERM GOAL #  3   Title pt with average 70-72 dB at sentence level 18/20 sentences over 2 sessions    Time 3    Period Weeks    Status On-going      SLP SHORT TERM GOAL #4   Title pt will have 10 or less throat clears over 45 minute session    Time 3    Period Weeks    Status On-going              SLP Long Term Goals - 01/10/22 1506       SLP LONG TERM GOAL #1   Title pt will average 90 dB with x5 loud "ah" mod I over 3 sessions    Time 11    Period Weeks    Status On-going      SLP LONG TERM GOAL #2   Title pt will average 70-72 dB over 20 minute conversation with rare min A    Time 11    Period Weeks    Status On-going      SLP LONG TERM GOAL #3   Title pt/spouse report 50% decrease in requests for repetition subjectively at home    Time 11    Period Weeks    Status On-going      SLP LONG TERM GOAL #4   Title pt/spouse will report compliance with vocal  hygiene recommendations over 1 week period    Time 11    Period Weeks    Status On-going              Plan - 01/10/22 1503     Clinical Impression Statement [name] has Parkinson's and presents with reduced vocal intensity/volume and hoarseness during speech.  Pt and wife endorse pt's low volume impacting communication in the home. Pt's spontaneous speech averages in low 60s dB. Pt scores Communication Participation Item Bank, scoring a total of 25/30. Pt reports "a little" difficulty communication quickly, talking with people he does not know, asking questions in conversations, giving detailed information, and getting a turn in a fast paced conversation. 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    Speech Therapy Frequency 2x / week    Duration 12 weeks    Treatment/Interventions Functional tasks;Cueing hierarchy;Environmental controls;Patient/family education;SLP instruction and feedback;Internal/external aids;Compensatory strategies;Compensatory techniques    Potential to Achieve Goals Good    Potential Considerations Medical prognosis    SLP Home Exercise Plan loud "ah"; oral reading    Consulted and Agree with Plan of Care Patient;Family member/caregiver    Family Member Consulted wife, [name]             Patient will benefit from skilled therapeutic intervention in order to improve the following deficits and impairments:   Dysarthria    Problem List Patient Active Problem List   Diagnosis Date Noted   Parkinson's disease ([place]) 11/25/2020   Edema of right lower leg 11/25/2020   Hyperlipidemia 11/25/2020   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

## 2022-01-10 NOTE — Patient Instructions (Signed)
Access Code: [email or web address] URL: [email or web address] Date: 01/10/2022 Prepared by: Mickie Bail Rasheeda Mulvehill  Exercises Sit to stand with band pull-apart - 1 x daily - 7 x weekly - 3 sets - 10 reps Step Sideways with Arms Reaching - 1 x daily - 7 x weekly - 3 sets - 10 reps Side Stomps - 1 x daily - 7 x weekly - 3 sets - 10 reps Backward Walking with Counter Support - 1 x daily - 7 x weekly - 3 sets - 10 reps Seated Windmill Trunk Rotation Stretch - 1 x daily - 7 x weekly - 3 sets - 10 reps

## 2022-01-10 NOTE — Therapy (Signed)
[facility] [address] [place], [place], [address] Phone: [phone]   Fax:  [phone]  Occupational Therapy Treatment  Patient Details  Name: [name] MRN: [id number] Date of Birth: 1945-12-16 Referring Provider (OT): Dr. Rexene Alberts   Encounter Date: 01/10/2022   OT End of Session - 01/10/22 1338     Visit Number 2    Number of Visits 25    Date for OT Re-Evaluation 03/31/22    Authorization Type [employer]    Authorization Time Period 60 days- POC written for 12 weeks anticiapte d/c following 6-8 weeks    Authorization - Visit Number 2    Authorization - Number of Visits 10    Progress Note Due on Visit 10    OT Start Time 1316    OT Stop Time 1400    OT Time Calculation (min) 44 min    Behavior During Therapy [name] for tasks assessed/performed             Past Medical History:  Diagnosis Date   High cholesterol    Parkinson disease ([place])     Past Surgical History:  Procedure Laterality Date   TONSILLECTOMY      There were no vitals filed for this visit.                         OT Education - 01/10/22 1507     Education Details PWR! basic 4 in supine, min-mod v.c for amplitude,  and positioning, coordiantion HEP,    Person(s) Educated Patient    Methods Explanation;Demonstration;Verbal cues;Handout    Comprehension Verbalized understanding;Returned demonstration;Verbal cues required;Need further instruction              OT Short Term Goals - 01/06/22 1497       OT SHORT TERM GOAL #1   Title I with PD specific HEP    Time 4    Period Weeks    Status New    Target Date 02/03/22      OT SHORT TERM GOAL #2   Title Pt will verbalize understanding of adapted strategies to maximize safety and I with ADLs/ IADLs .    Time 4    Period Weeks    Status New      OT SHORT TERM GOAL #3   Title Pt will demonstrate improved fine motor coordination for ADLs as evidenced by  decreasing 9 hole peg test score for RUE by 3 secs    Baseline R 38.25, L 33.75    Time 4    Period Weeks    Status New      OT SHORT TERM GOAL #4   Title Pt will demonstrated improved LUE functional use as evidenced by increasing box/ blocks by 3 blocks    Baseline R 45, L 39    Time 4    Period Weeks    Status New      OT SHORT TERM GOAL #5   Title Pt will verbalize understanding of ways to keep thinking skills sharp.    Time 4    Period Weeks    Status New               OT Long Term Goals - 01/06/22 0809       OT LONG TERM GOAL #1   Title Pt will verbalize understanding of ways to prevent future PD related complications and PD community resources.    Time  12    Period Weeks    Status New    Target Date 03/31/22      OT LONG TERM GOAL #2   Title Pt will demonstrate improved ease with feeding as evidenced by decreasing PPT#2 (self feeding) by 3 secs    Baseline 14.19 secs    Time 12    Period Weeks    Status New      OT LONG TERM GOAL #3   Title Pt will retrieve an item at 130 shoulder flexion and -20 elbow extension with RUE.    Baseline 125, -25    Time 12    Period Weeks    Status New      OT LONG TERM GOAL #4   Title Pt will demonstrate ability to write a short paragraph with 100% legibility and no significant decrease in letter size.    Time 12    Period Weeks    Status New                   Plan - 01/10/22 1512     Clinical Impression Statement Pt is progressing towards goals. He demonstrates understanding of supine PWR! however he will benefit from review.    OT Occupational Profile and History Detailed Assessment- Review of Records and additional review of physical, cognitive, psychosocial history related to current functional performance    Occupational performance deficits (Please refer to evaluation for details): ADL's;IADL's;Leisure;Play;Social Participation    Body Structure / Function / Physical Skills  ADL;Balance;Endurance;Mobility;Strength;Flexibility;UE functional use;FMC;Gait;Coordination;ROM;GMC;Decreased knowledge of precautions;Decreased knowledge of use of DME;IADL;Dexterity    Rehab Potential Good    Clinical Decision Making Limited treatment options, no task modification necessary    Comorbidities Affecting Occupational Performance: May have comorbidities impacting occupational performance    Modification or Assistance to Complete Evaluation  No modification of tasks or assist necessary to complete eval    OT Frequency 2x / week   plus eval, POC written for 12 weeks anticipate d/c after 6-8 weeks   OT Duration 12 weeks    OT Treatment/Interventions Self-care/ADL training;Therapeutic exercise;Functional Mobility Training;Balance training;Aquatic Therapy;Ultrasound;Neuromuscular education;Splinting;Manual Therapy;Therapeutic activities;DME and/or AE instruction;Cognitive remediation/compensation;Gait Training;Moist Heat;Fluidtherapy;Paraffin;Cryotherapy;Passive range of motion;Patient/family education    Plan review PWR1 supine or progress to PWR!seated.    Consulted and Agree with Plan of Care Patient             Patient will benefit from skilled therapeutic intervention in order to improve the following deficits and impairments:   Body Structure / Function / Physical Skills: ADL, Balance, Endurance, Mobility, Strength, Flexibility, UE functional use, FMC, Gait, Coordination, ROM, GMC, Decreased knowledge of precautions, Decreased knowledge of use of DME, IADL, Dexterity       Visit Diagnosis: Abnormal posture  Other lack of coordination  Other symptoms and signs involving the nervous system  Unsteadiness on feet  Other abnormalities of gait and mobility    Problem List Patient Active Problem List   Diagnosis Date Noted   Parkinson's disease ([place]) 11/25/2020   Edema of right lower leg 11/25/2020   Hyperlipidemia 11/25/2020   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 [place], [place], [address] Phone: [phone]   Fax:  [phone]  Name: [name] MRN: [id number] Date of Birth: 1945-12-21

## 2022-01-10 NOTE — Patient Instructions (Addendum)
Throat clearing: Try noticing your throat clear Try to suppress if able Swallowing hard Taking a drink of water Breathe in through your nose, out through pursed lips   SPEAK OUT! is a structured program targeting voice in patient's with Parkinson's. This program was developed by [employer]. It is evidence based and based on principles of motor learning.   www.parkinsonvoiceproject.org for more information  Learn about Parkinson's webinar: PokerProtocol.cz -- highly recommend watching this webinar!!  Order your stimulus booklet: [phone] Your provider: Myra Gianotti SLP, [facility]  This book is free!! They do offer a "pay if forward" option where you can donate to the non-profit organization so they can continue serving the Parkinson's population and providing these valuable resources to patients.   Vocal Intensity (Loudness) Think about projecting your voice, intensity level 4/5 Practice loud ahs 5x 2x a day; phrase level (focus on intensity)

## 2022-01-12 ENCOUNTER — Ambulatory Visit: Payer: Medicare HMO | Admitting: Speech Pathology

## 2022-01-12 ENCOUNTER — Other Ambulatory Visit: Payer: Self-pay

## 2022-01-12 ENCOUNTER — Ambulatory Visit: Payer: Medicare HMO | Admitting: Occupational Therapy

## 2022-01-12 ENCOUNTER — Ambulatory Visit: Payer: Medicare HMO | Attending: Family Medicine | Admitting: Physical Therapy

## 2022-01-12 ENCOUNTER — Ambulatory Visit (INDEPENDENT_AMBULATORY_CARE_PROVIDER_SITE_OTHER): Payer: Medicare HMO | Admitting: Family Medicine

## 2022-01-12 VITALS — BP 100/60 | HR 74 | Temp 97.8°F | Ht 74.0 in | Wt 242.2 lb

## 2022-01-12 DIAGNOSIS — R2689 Other abnormalities of gait and mobility: Secondary | ICD-10-CM | POA: Diagnosis not present

## 2022-01-12 DIAGNOSIS — R2681 Unsteadiness on feet: Secondary | ICD-10-CM | POA: Diagnosis not present

## 2022-01-12 DIAGNOSIS — R293 Abnormal posture: Secondary | ICD-10-CM | POA: Insufficient documentation

## 2022-01-12 DIAGNOSIS — R471 Dysarthria and anarthria: Secondary | ICD-10-CM | POA: Insufficient documentation

## 2022-01-12 DIAGNOSIS — I499 Cardiac arrhythmia, unspecified: Secondary | ICD-10-CM

## 2022-01-12 DIAGNOSIS — G4733 Obstructive sleep apnea (adult) (pediatric): Secondary | ICD-10-CM | POA: Diagnosis not present

## 2022-01-12 DIAGNOSIS — R29818 Other symptoms and signs involving the nervous system: Secondary | ICD-10-CM | POA: Insufficient documentation

## 2022-01-12 DIAGNOSIS — R278 Other lack of coordination: Secondary | ICD-10-CM | POA: Diagnosis not present

## 2022-01-12 DIAGNOSIS — I471 Supraventricular tachycardia, unspecified: Secondary | ICD-10-CM | POA: Insufficient documentation

## 2022-01-12 NOTE — Patient Instructions (Signed)
#  Referral ?I have placed a referral to a specialist for you. You should receive a phone call from the specialty office. Make sure your voicemail is not full and that if you are able to answer your phone to unknown or new numbers.  ? ?It may take up to 2 weeks to hear about the referral. If you do not hear anything in 2 weeks, please call our office and ask to speak with the referral coordinator.  ? ? ?Call if  ?- chest pain ?- shortness of breath ?- fast heart rate ?- irregular ?

## 2022-01-12 NOTE — Therapy (Signed)
[facility] ?[place], [place], [address] ?Phone: [phone]   Fax:  [phone] ? ?Occupational Therapy Treatment ? ?Patient Details  ?Name: [name] ?MRN: [id number] ?Date of Birth: [birth year] ?Referring Provider (OT): Dr. Rexene Alberts ? ? ?Encounter Date: 01/12/2022 ? ? OT End of Session - 01/12/22 1356   ? ? Visit Number 3   ? Number of Visits 25   ? Date for OT Re-Evaluation 03/31/22   ? Authorization Type [employer]   ? Authorization Time Period 60 days- POC written for 12 weeks anticiapte d/c following 6-8 weeks   ? Authorization - Visit Number 3   ? Authorization - Number of Visits 10   ? Progress Note Due on Visit 10   ? OT Start Time 1317   ? OT Stop Time 1357   ? OT Time Calculation (min) 40 min   ? ?  ?  ? ?  ? ? ?Past Medical History:  ?Diagnosis Date  ? High cholesterol   ? Parkinson disease ([place])   ? ? ?Past Surgical History:  ?Procedure Laterality Date  ? TONSILLECTOMY    ? ? ?There were no vitals filed for this visit. ? ? Subjective Assessment - 01/12/22 1350   ? ? Pertinent History PD diganosed in 2019   cellulitis, lymphedema, low back pain, hyperlipidemia,   ? Patient Stated Goals mobility, maintain independence   ? Currently in Pain? No/denies   ? ?  ?  ? ?  ? ? ? ? ? ? ? ? ? ? ?Treatment:Placing grooved pegs into pegboard with right and left UE's , then removing with in hand manipulation min v.c for posture and performance. ? ? ? ? ? ? ? ? ? ? ? ? OT Education - 01/12/22 1352   ? ? Education Details PWR! basic 4 in seated  min v.c for amplitude,  and positioning, PWR! hands basic 4 5-10 reps each, handwriting strategies with practice and handout issued   ? Person(s) Educated Patient   ? Methods Explanation;Demonstration;Verbal cues;Handout   ? Comprehension Verbalized understanding;Returned demonstration;Verbal cues required   ? ?  ?  ? ?  ? ? ? OT Short Term Goals - 01/06/22 0807   ? ?  ? OT SHORT TERM GOAL #1  ? Title I with PD  specific HEP   ? Time 4   ? Period Weeks   ? Status New   ? Target Date 02/03/22   ?  ? OT SHORT TERM GOAL #2  ? Title Pt will verbalize understanding of adapted strategies to maximize safety and I with ADLs/ IADLs .   ? Time 4   ? Period Weeks   ? Status New   ?  ? OT SHORT TERM GOAL #3  ? Title Pt will demonstrate improved fine motor coordination for ADLs as evidenced by decreasing 9 hole peg test score for RUE by 3 secs   ? Baseline R 38.25, L 33.75   ? Time 4   ? Period Weeks   ? Status New   ?  ? OT SHORT TERM GOAL #4  ? Title Pt will demonstrated improved LUE functional use as evidenced by increasing box/ blocks by 3 blocks   ? Baseline R 45, L 39   ? Time 4   ? Period Weeks   ? Status New   ?  ? OT SHORT TERM GOAL #5  ? Title Pt will verbalize understanding of ways to keep  thinking skills sharp.   ? Time 4   ? Period Weeks   ? Status New   ? ?  ?  ? ?  ? ? ? ? OT Long Term Goals - 01/06/22 0809   ? ?  ? OT LONG TERM GOAL #1  ? Title Pt will verbalize understanding of ways to prevent future PD related complications and PD community resources.   ? Time 12   ? Period Weeks   ? Status New   ? Target Date 03/31/22   ?  ? OT LONG TERM GOAL #2  ? Title Pt will demonstrate improved ease with feeding as evidenced by decreasing PPT#2 (self feeding) by 3 secs   ? Baseline 14.19 secs   ? Time 12   ? Period Weeks   ? Status New   ?  ? OT LONG TERM GOAL #3  ? Title Pt will retrieve an item at 130 shoulder flexion and -20 elbow extension with RUE.   ? Baseline 125, -25   ? Time 12   ? Period Weeks   ? Status New   ?  ? OT LONG TERM GOAL #4  ? Title Pt will demonstrate ability to write a short paragraph with 100% legibility and no significant decrease in letter size.   ? Time 12   ? Period Weeks   ? Status New   ? ?  ?  ? ?  ? ? ? ? ? ? ? ? Plan - 01/12/22 1402   ? ? Clinical Impression Statement Pt is progressing towards goals. He demonstrates understanding of seated PWR! moves   ? OT Occupational Profile and History  Detailed Assessment- Review of Records and additional review of physical, cognitive, psychosocial history related to current functional performance   ? Occupational performance deficits (Please refer to evaluation for details): ADL's;IADL's;Leisure;Play;Social Participation   ? Body Structure / Function / Physical Skills ADL;Balance;Endurance;Mobility;Strength;Flexibility;UE functional use;FMC;Gait;Coordination;ROM;GMC;Decreased knowledge of precautions;Decreased knowledge of use of DME;IADL;Dexterity   ? Rehab Potential Good   ? Clinical Decision Making Limited treatment options, no task modification necessary   ? Comorbidities Affecting Occupational Performance: May have comorbidities impacting occupational performance   ? Modification or Assistance to Complete Evaluation  No modification of tasks or assist necessary to complete eval   ? OT Frequency 2x / week   plus eval, POC written for 12 weeks anticipate d/c after 6-8 weeks  ? OT Duration 12 weeks   ? OT Treatment/Interventions Self-care/ADL training;Therapeutic exercise;Functional Mobility Training;Balance training;Aquatic Therapy;Ultrasound;Neuromuscular education;Splinting;Manual Therapy;Therapeutic activities;DME and/or AE instruction;Cognitive remediation/compensation;Gait Training;Moist Heat;Fluidtherapy;Paraffin;Cryotherapy;Passive range of motion;Patient/family education   ? Plan review PWR!moves seated or supine, big movments with ADLS, feeding strategies.   ? Consulted and Agree with Plan of Care Patient   ? ?  ?  ? ?  ? ? ?Patient will benefit from skilled therapeutic intervention in order to improve the following deficits and impairments:   ?Body Structure / Function / Physical Skills: ADL, Balance, Endurance, Mobility, Strength, Flexibility, UE functional use, FMC, Gait, Coordination, ROM, GMC, Decreased knowledge of precautions, Decreased knowledge of use of DME, IADL, Dexterity ?  ?  ? ? ?Visit Diagnosis: ?Abnormal posture ? ?Other lack of  coordination ? ?Other symptoms and signs involving the nervous system ? ?Unsteadiness on feet ? ?Other abnormalities of gait and mobility ? ? ? ?Problem List ?Patient Active Problem List  ? Diagnosis Date Noted  ? Irregular heart rate 01/12/2022  ? Parkinson's disease ([place]) 11/25/2020  ? Edema of  right lower leg 11/25/2020  ? Hyperlipidemia 11/25/2020  ? 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 Hearing loss 11/25/2020  ? Pityriasis rosea 11/25/2020  ? ? ?Zebulan Hinshaw, OT ?01/12/2022, 2:03 PM ? ?[facility] ?[facility] ?[place], [place], [address] ?Phone: [phone]   Fax:  [phone] ? ?Name: TARIN NAVAREZ ?MRN: [id number] ?Date of Birth: 30-Jan-1946 ? ?

## 2022-01-12 NOTE — Assessment & Plan Note (Signed)
Patient was told he had an abnormal EKG at recent sleep study, unable to review that EKG today.  On exam he has a regular rate and rhythm, EKG in the office today was normal sinus no irregular heartbeats.  Was able to access in the media tab some documentation with questionable PACs and PVCs however unable to actually see the rhythm strip. Discussed referral to cardiology to further evaluate. ?

## 2022-01-12 NOTE — Patient Instructions (Signed)
PWR! Hands ? ?With arms stretched out in front of you (elbows straight), perform the following: ?PWR! Up: Close hands and flick fingers open and apart BIG ?PWR! Rock: Move wrists up and down BIG ? ?Then, start with elbows bent and hands closed. ?PWR! Step: Touch index finger to thumb while keeping other fingers straight. Flick fingers out BIG (thumb out/straighten fingers). Repeat with other fingers. (Step your thumb to each finger). ?PWR! Hands: Push hands out BIG. Elbows straight, wrists up, fingers open and spread apart BIG. (Can also perform by pushing down on table, chair, knees. Push above head, out to the side, behind you, in front of you.) ? ? ?** Make each movement big and deliberate so that you feel the movement. ? ?Perform at least 10 repetitions 1x/day, but perform PWR! hands throughout the day when you are having trouble using your hands (picking up/manipulating small objects, writing, eating, typing, sewing, buttoning, etc.). ?

## 2022-01-12 NOTE — Assessment & Plan Note (Signed)
Reviewed recent sleep study including statement with concern about abnormal EKG.  Unfortunately unable to see EKG though exam was consistent with sleep apnea.  Appreciate neurology support patient is on wait list to get CPAP machine ?

## 2022-01-12 NOTE — Therapy (Signed)
OUTPATIENT PHYSICAL THERAPY TREATMENT NOTE   Patient Name: [name] MRN: [id number] DOB:1946/04/19, 76 y.o., male Today's Date: 01/12/2022  PCP: Lesleigh Noe, MD REFERRING PROVIDER: Star Age, MD    PT End of Session - 01/12/22 1451     Visit Number 3    Number of Visits 17   Plus eval   Date for PT Re-Evaluation 03/30/22    Authorization Type [employer]    Authorization Time Period Needs auth    PT Start Time 7829    PT Stop Time 1448    PT Time Calculation (min) 50 min    Equipment Utilized During Treatment Gait belt    Activity Tolerance Patient tolerated treatment well    Behavior During Therapy WFL for tasks assessed/performed             Past Medical History:  Diagnosis Date   High cholesterol    Parkinson disease ([place])    Past Surgical History:  Procedure Laterality Date   TONSILLECTOMY     Patient Active Problem List   Diagnosis Date Noted   Irregular heart rate 01/12/2022   OSA (obstructive sleep apnea) 01/12/2022   Parkinson's disease ([place]) 11/25/2020   Edema of right lower leg 11/25/2020   Hyperlipidemia 11/25/2020   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  PRECAUTIONS: Fall  SUBJECTIVE: Tried to buy a RW in [place], "did not go well" so ended up ordering on off [place]. Should arrive next week. Exercises are going well.   PAIN:  Are you having pain? No   TODAY'S TREATMENT:  The following treadmill training was completed for aerobic/neural priming, endurance, and gait speed. Completed with 2 persons to guard pt with min A and to hold theraball - Warmup: 2:00 up to 0.9 mph - HIIT: 6:00 30sec ON/ 60 sec OFF alternating theraball kicks / normal waking 0.9  mph    Sit <>stands w/10# dowel for anterior weight shift practice and BLE strength: -10 reps holding dowel in front rack position w/emphasis on anterior weight shift, momentum to stand and immediate standing balance  -Progressed to 10 reps w/overhead press to bias posterior lean, CGA throughout for steadying assist.   Gait training w/posterior resistance [phone]' w/CGA and no AD, 2 therapists present to guard pt (CGA) and apply resistance. Noted decreased step length of RLE and increased kyphotic posture throughout.  -progressed to random posterior perturbations w/gait to challenge reactive stepping x230'. Noted pt always stepped w/R foot first.  -Progressed to retro walking w/random posterior pertubation and immediate anterior step to practice set-switching    Lateral #12 kettle bell wide stance deadlift progression. 2 therapists present to guard pt and to provide concurrent visual feedback for improved performance/form:  - x10 b/l deadlift - x 5 each side u/l deadlift w/contralateral UE abduction w/open palm    PATIENT EDUCATION: Education details: Fall prevention at home, continuing HEP, use of AD at home  Person educated: Patient Education method: Explanation Education comprehension: verbalized understanding   HOME EXERCISE PROGRAM: Access Code: [email or web address] URL: [email or web address] Date: 01/10/2022 Prepared by: Mickie Bail Zara Wendt   Exercises Sit to stand with band pull-apart - 1 x daily - 7 x weekly - 3 sets - 10 reps Step Sideways with  Arms Reaching - 1 x daily - 7 x weekly - 3 sets - 10 reps Side Stomps - 1 x daily - 7 x weekly - 3 sets - 10 reps Backward Walking with Counter Support - 1 x daily - 7 x weekly - 3 sets - 10 reps Seated Windmill Trunk Rotation Stretch - 1 x daily - 7 x weekly - 3 sets - 10 reps   PT Short Term Goals - 01/06/22 0806       PT SHORT TERM GOAL #1   Title Pt will be independent with initial HEP with wife's supervision in order to build  upon functional gains made in therapy. ALL STGS DUE 02/03/22    Time 4    Period Weeks    Status New    Target Date 02/03/22      PT SHORT TERM GOAL #2   Title Pt will maintain gait speed of 2.8 ft/sec with LRAD with supervision in order to demo improved community ambulation.    Baseline 2.77 ft/sec with no AD - min guard.    Time 4    Period Weeks    Status New      PT SHORT TERM GOAL #3   Title Pt will improve 5x sit <> stand with no UE support to 19 seconds or less in order to demo improved functional strength/decr fall risk.    Baseline 22.97 seconds    Time 4    Period Weeks    Status New      PT SHORT TERM GOAL #4   Title Pt will ambulate at least 230' over level indoor surfaces with LRAD with supervision for improved safety with mobility.    Baseline no AD- needs min guard and min A for balance at times.    Time 4    Period Weeks    Status New      PT SHORT TERM GOAL #5   Title Pt and pt's wife will verbalize understanding of fall prevention in the home.    Time 4    Period Weeks    Status New              PT Long Term Goals - 01/06/22 0818       PT LONG TERM GOAL #1   Title Pt will be independent with final HEP with wife's supervision in order to build upon functional gains made in therapy. ALL LTGS DUE 03/03/22    Time 8    Period Weeks    Status New    Target Date 03/03/22      PT LONG TERM GOAL #2   Title Pt will improve miniBEST to at least a 18/28 in order to demo decr fall risk.    Baseline 12/28    Time 8    Period Weeks    Status New      PT LONG TERM GOAL #3   Title Pt will improve 5x sit <> stand with no UE support to 15 seconds or less in order to demo improved functional strength/decr fall risk.    Baseline 22.97 seconds    Time 8    Period Weeks    Status New      PT LONG TERM GOAL #4   Title Pt will ambulate at least 400' over outdoor paved surfaces with LRAD with mod I in order to demo improved community mobility.    Time 8     Period Weeks    Status New  PT LONG TERM GOAL #5   Title Pt will verbalize understanding of local Parkinson's disease resources,    Time 8    Period Weeks    Status New      Additional Long Term Goals   Additional Long Term Goals Yes      PT LONG TERM GOAL #6   Title Pt will recover posterior and anterior balance in push and release test in 2 steps independently, for improved balance recovery    Time 8    Period Weeks    Status New              Plan - 01/12/22 1501     Clinical Impression Statement Emphasis of skilled session on reactive stepping strategies, gait training and UE/LE coordination. Pt continues to demonstrate decreased step length/clearance of RLE, kyphotic posture that worsens with fatigue, variable gait speed that results in shuffling and absent heel strike bilaterally. Pt requires min-mod verbal and tactile cues to correct posture and slow down gait velocity to minimize shuffling. Pt very motivated to participate in therapy and is to obtain RW this weekend to bring to sessions next week. Continue POC.    Personal Factors and Comorbidities Age;Fitness;Past/Current Experience;Comorbidity 1;Time since onset of injury/illness/exacerbation    Comorbidities Cellulitis    Examination-Activity Limitations Bend;Carry;Reach Overhead;Locomotion Level;Lift;Stairs;Squat;Stand;Transfers    Examination-Participation Restrictions Community Activity    Stability/Clinical Decision Making Evolving/Moderate complexity    Rehab Potential Good    PT Frequency 2x / week    PT Duration 12 weeks    PT Treatment/Interventions ADLs/Self Care Home Management;Gait training;DME Instruction;Stair training;Functional mobility training;Therapeutic activities;Therapeutic exercise;Balance training;Neuromuscular re-education;Cognitive remediation;Patient/family education;Energy conservation;Vestibular    PT Next Visit Plan Gait velocity w/RW, continued stepping strategies, sit <>stand  variations, dual-tasking, DL progression    PT Home Exercise Plan [email or web address]    Consulted and Agree with Plan of Care Patient;Family member/caregiver    Family Member Consulted Wife, Kyra Manges Oaklee Esther, PT, DPT 01/12/2022, 3:24 PM

## 2022-01-12 NOTE — Patient Instructions (Signed)
Glide: start low and go high in pitch STOP start high and go low ? ?X5 2x a day ?

## 2022-01-12 NOTE — Therapy (Signed)
[facility], [place], [address] ?Phone: [phone]   Fax:  [phone] ? ?Speech Language Pathology Treatment ? ?Patient Details  ?Name: [name] ?MRN: [id number] ?Date of Birth: 1946/01/07 ?Referring Provider (SLP): Dr. Rexene Alberts ? ? ?Encounter Date: 01/12/2022 ? ? End of Session - 01/12/22 1330   ? ? Visit Number 3   ? Number of Visits 25   ? Date for SLP Re-Evaluation 04/07/22   ? SLP Start Time 1230   ? SLP Stop Time  1317   ? SLP Time Calculation (min) 47 min   ? Activity Tolerance Patient tolerated treatment well   ? ?  ?  ? ?  ? ? ?Past Medical History:  ?Diagnosis Date  ? High cholesterol   ? Parkinson disease ([place])   ? ? ?Past Surgical History:  ?Procedure Laterality Date  ? TONSILLECTOMY    ? ? ?There were no vitals filed for this visit. ? ? Subjective Assessment - 01/12/22 1231   ? ? Subjective "I saw the doctor this morning."   ? Currently in Pain? No/denies   ? ?  ?  ? ?  ? ? ? ? ? ? ? ? ADULT SLP TREATMENT - 01/12/22 1232   ? ?  ? General Information  ? Behavior/Cognition Alert;Cooperative;Pleasant mood   ?  ? Treatment Provided  ? Treatment provided Cognitive-Linquistic   ?  ? Cognitive-Linquistic Treatment  ? 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 Skilled Treatment Reports compliance with HEP. Attempting to swallow instead of cleaning throat, is having [name] alert him to throat clears. Demonstrates increased awareness this date, however still demonstrating frequent throat clears. Target improving quality of voice and vocal intensity. Mod A faded to rare min A to obtain dB targets during structured speech tasks this date. Loud "ah" average 85 dB; "may me my moe moo" average 82 dB; couting 78 db; phrase completion 72 dB. Worked to calibrate pt's perception of loudness to appropriate conversational level by providing 1:1 feedback on phrase completions. Usual mod A to obtain dB targets during spontaneous productions  average productions low 60s dB. Use of verbal cues "speak with intent, power your voice" paired with visual cues beneficial this date.   ?  ? Assessment / Recommendations / Plan  ? Plan Continue with current plan of care   ?  ? Progression Toward Goals  ? Progression toward goals Progressing toward goals   ? ?  ?  ? ?  ? ? ? SLP Education - 01/12/22 1329   ? ? Education Details Speak Out program format, vocal intensity required to meet typical conversational dB level   ? Person(s) Educated Patient   ? Methods Explanation;Demonstration   ? Comprehension Verbalized understanding;Returned demonstration;Need further instruction   ? ?  ?  ? ?  ? ? ? SLP Short Term Goals - 01/12/22 1332   ? ?  ? SLP SHORT TERM GOAL #1  ? Title pt will produce loud "ah" averaging 90 dB over 5 trials with rare min A across 2 sessions   ? Time 3   ? Period Weeks   ? Status On-going   ?  ? SLP SHORT TERM GOAL #2  ? Title pt will average 70-72 dB across 5 minute conversational sample   ? Time 3   ? Period Weeks   ? Status On-going   ?  ? SLP SHORT TERM GOAL #3  ? Title pt with average 70-72  dB at sentence level 18/20 sentences over 2 sessions   ? Time 3   ? Period Weeks   ? Status On-going   ?  ? SLP SHORT TERM GOAL #4  ? Title pt will have 10 or less throat clears over 45 minute session   ? Time 3   ? Period Weeks   ? Status On-going   ? ?  ?  ? ?  ? ? ? SLP Long Term Goals - 01/12/22 1332   ? ?  ? SLP LONG TERM GOAL #1  ? Title pt will average 90 dB with x5 loud "ah" mod I over 3 sessions   ? Time 11   ? Period Weeks   ? Status On-going   ?  ? SLP LONG TERM GOAL #2  ? Title pt will average 70-72 dB over 20 minute conversation with rare min A   ? Time 11   ? Period Weeks   ? Status On-going   ?  ? SLP LONG TERM GOAL #3  ? Title pt/spouse report 50% decrease in requests for repetition subjectively at home   ? Time 11   ? Period Weeks   ? Status On-going   ?  ? SLP LONG TERM GOAL #4  ? Title pt/spouse will report compliance with vocal hygiene  recommendations over 1 week period   ? Time 11   ? Period Weeks   ? Status On-going   ? ?  ?  ? ?  ? ? ? Plan - 01/12/22 1331   ? ? Clinical Impression Statement [name] has Parkinson's and presents with reduced vocal intensity/volume and hoarseness during speech.  Pt and wife endorse pt's low volume impacting communication in the home. Pt's spontaneous speech averages in low 60s dB. Pt scores Communication Participation Item Bank, scoring a total of 25/30. Pt reports "a little" difficulty communication quickly, talking with people he does not know, asking questions in conversations, giving detailed information, and getting a turn in a fast paced conversation. 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   ? Speech Therapy Frequency 2x / week   ? Duration 12 weeks   ? Treatment/Interventions Functional tasks;Cueing hierarchy;Environmental controls;Patient/family education;SLP instruction and feedback;Internal/external aids;Compensatory strategies;Compensatory techniques   ? Potential to Achieve Goals Good   ? Potential Considerations Medical prognosis   ? SLP Home Exercise Plan loud "ah"; oral reading, vocal glides   ? Consulted and Agree with Plan of Care Patient;Family member/caregiver   ? Family Member Consulted wife, [name]   ? ?  ?  ? ?  ? ? ?Patient will benefit from skilled therapeutic intervention in order to improve the following deficits and impairments:   ?Dysarthria ? ? ? ?Problem List ?Patient Active Problem List  ? Diagnosis Date Noted  ? Irregular heart rate 01/12/2022  ? OSA (obstructive sleep apnea) 01/12/2022  ? Parkinson's disease ([place]) 11/25/2020  ? Edema of right lower leg 11/25/2020  ? Hyperlipidemia 11/25/2020  ? 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 Hearing loss 11/25/2020  ? Pityriasis rosea 11/25/2020  ? ? ?Su Monks, CF-SLP ?01/12/2022, 3:33 PM ? ?[facility] ?[place], [place],  [address] ?Phone: [phone]   Fax:  [phone] ? ? ?Name: [name] ?MRN: [id number] ?Date of Birth: [birth year] ? ?

## 2022-01-12 NOTE — Progress Notes (Signed)
? ?Subjective:  ? ?  ?[name] is a 76 y.o. male presenting for Irregular Heart Beat (During sleep study ) ?  ? ? ?HPI ? ?#Irregular HR ?- was told he had an irregular HR during his sleep study ?- no cp ?- no skipping or irregular HR ?- no palpitations ?- doing PT/OT/SLP - doing well ?- no sob  ?- no DOE ? ?Recent OSA evaluation - getting  CPAP due to desaturation  ? ?Sleep study technical data - ?PAC/PVC - in media tab ? ?Review of Systems ? ? ?Social History  ? ?Tobacco Use  ?Smoking Status Former  ? Types: Pipe  ?Smokeless Tobacco Never  ? ? ? ?   ?Objective:  ?  ?BP Readings from Last 3 Encounters:  ?01/12/22 100/60  ?12/29/21 130/76  ?04/11/21 115/67  ? ?Wt Readings from Last 3 Encounters:  ?01/12/22 242 lb 4 oz (109.9 kg)  ?12/29/21 248 lb (112.5 kg)  ?02/07/21 235 lb (106.6 kg)  ? ? ?BP 100/60   Pulse 74   Temp 97.8 ?F (36.6 ?C) (Oral)   Ht 6\' 2"  (1.88 m)   Wt 242 lb 4 oz (109.9 kg)   SpO2 97%   BMI 31.10 kg/m?  ? ? ?Physical Exam ?Constitutional:   ?   Appearance: Normal appearance. He is not ill-appearing or diaphoretic.  ?HENT:  ?   Right Ear: External ear normal.  ?   Left Ear: External ear normal.  ?Eyes:  ?   General: No scleral icterus. ?   Extraocular Movements: Extraocular movements intact.  ?   Conjunctiva/sclera: Conjunctivae normal.  ?Cardiovascular:  ?   Rate and Rhythm: Normal rate and regular rhythm.  ?   Heart sounds: No murmur heard. ?Pulmonary:  ?   Effort: Pulmonary effort is normal. No respiratory distress.  ?   Breath sounds: Normal breath sounds. No wheezing.  ?Musculoskeletal:  ?   Cervical back: Neck supple.  ?Skin: ?   General: Skin is warm and dry.  ?Neurological:  ?   Mental Status: He is alert. Mental status is at baseline.  ?Psychiatric:     ?   Mood and Affect: Mood normal.  ? ? ?EKG: NSR, no ST or T-wave abnormalities, no extra beats ? ? ?   ?Assessment & Plan:  ? ?Problem List Items Addressed This Visit   ? ?  ? Respiratory  ? OSA (obstructive sleep apnea)  ?   Reviewed recent sleep study including statement with concern about abnormal EKG.  Unfortunately unable to see EKG though exam was consistent with sleep apnea.  Appreciate neurology support patient is on wait list to get CPAP machine ?  ?  ?  ? Other  ? Irregular heart rate - Primary  ?  Patient was told he had an abnormal EKG at recent sleep study, unable to review that EKG today.  On exam he has a regular rate and rhythm, EKG in the office today was normal sinus no irregular heartbeats.  Was able to access in the media tab some documentation with questionable PACs and PVCs however unable to actually see the rhythm strip. Discussed referral to cardiology to further evaluate. ?  ?  ? Relevant Orders  ? EKG 12-Lead (Completed)  ? Ambulatory referral to Cardiology  ? ? ? ?Return in about 3 months (around 04/14/2022) for for medicare wellness with health nurse. ? ?Lesleigh Noe, MD ? ?This visit occurred during the SARS-CoV-2 public health emergency.  Safety protocols were  in place, including screening questions prior to the visit, additional usage of staff PPE, and extensive cleaning of exam room while observing appropriate contact time as indicated for disinfecting solutions.  ? ?

## 2022-01-17 ENCOUNTER — Ambulatory Visit: Payer: Medicare HMO | Admitting: Speech Pathology

## 2022-01-17 ENCOUNTER — Other Ambulatory Visit: Payer: Self-pay

## 2022-01-17 ENCOUNTER — Ambulatory Visit: Payer: Medicare HMO | Admitting: Occupational Therapy

## 2022-01-17 ENCOUNTER — Ambulatory Visit: Payer: Medicare HMO | Admitting: Physical Therapy

## 2022-01-17 DIAGNOSIS — R2681 Unsteadiness on feet: Secondary | ICD-10-CM

## 2022-01-17 DIAGNOSIS — R471 Dysarthria and anarthria: Secondary | ICD-10-CM

## 2022-01-17 DIAGNOSIS — R293 Abnormal posture: Secondary | ICD-10-CM

## 2022-01-17 DIAGNOSIS — R2689 Other abnormalities of gait and mobility: Secondary | ICD-10-CM

## 2022-01-17 DIAGNOSIS — R29818 Other symptoms and signs involving the nervous system: Secondary | ICD-10-CM

## 2022-01-17 DIAGNOSIS — R278 Other lack of coordination: Secondary | ICD-10-CM | POA: Diagnosis not present

## 2022-01-17 NOTE — Patient Instructions (Signed)

## 2022-01-17 NOTE — Patient Instructions (Signed)
Knot and tie the resistance bands at one end of the band (Alternative option to loop band around bottom of stable chair that cannot move).  Secure to bottom of doorframe under hinge to prevent sliding upwards.   ?Begin in a bent position with back flat and soft bend in knees.  Pull bands upwards between legs using squeeze of the glut muscles. ?

## 2022-01-17 NOTE — Therapy (Signed)
?[facility] ?[place], [place], [address] ?Phone: [phone]   Fax:  [phone] ? ?Speech Language Pathology Treatment ? ?Patient Details  ?Name: [name] ?MRN: [id number] ?Date of Birth: [birth year] ?Referring Provider (SLP): Dr. Rexene Alberts ? ? ?Encounter Date: 01/17/2022 ? ? End of Session - 01/17/22 1327   ? ? Visit Number 4   ? Number of Visits 25   ? Date for SLP Re-Evaluation 04/07/22   ? SLP Start Time 1230   ? SLP Stop Time  1314   ? SLP Time Calculation (min) 44 min   ? Activity Tolerance Patient tolerated treatment well   ? ?  ?  ? ?  ? ? ?Past Medical History:  ?Diagnosis Date  ? High cholesterol   ? Parkinson disease ([place])   ? ? ?Past Surgical History:  ?Procedure Laterality Date  ? TONSILLECTOMY    ? ? ?There were no vitals filed for this visit. ? ? Subjective Assessment - 01/17/22 1225   ? ? Subjective "My book is on the way"   ? Currently in Pain? No/denies   ? ?  ?  ? ?  ? ? ? ? ? ? ? ? ADULT SLP TREATMENT - 01/17/22 1233   ? ?  ? General Information  ? Behavior/Cognition Alert;Cooperative;Pleasant mood   ?  ? Treatment Provided  ? Treatment provided Cognitive-Linquistic   ?  ? Cognitive-Linquistic Treatment  ? 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 Skilled Treatment Reports to doing HEP every day since last visit. Tells ST when he hits the right note his vocal quality is clear. Focus on maintaing adequate vocal intensity across increasingly complex speech tasks, while maintaining clear vocal quality. Max A for loud "ah," pt averaging 76 dB. Phrase level reading, average 70 dB, mod A. Conversational speech sample, ~4 minutes, 60-71 dB, mod A to maintain high 60s, use of visual cues helpful for pt. Pt demonstrating self awareness of breath support throughout tasks. Tells ST "thinking about speaking and breathing is the hardest part." Throat clears 20+ x across 45 minute session, some instances of pt sipping water with swallow  to avoid.   ?  ? Assessment / Recommendations / Plan  ? Plan Continue with current plan of care   ?  ? Progression Toward Goals  ? Progression toward goals Progressing toward goals   ? ?  ?  ? ?  ? ? ? SLP Education - 01/17/22 1326   ? ? Education Details breath support required for speech; conversational speech avg dB; intential speech for Parkinsons   ? Person(s) Educated Patient   ? Methods Explanation;Demonstration;Handout   ? Comprehension Verbalized understanding;Returned demonstration;Verbal cues required;Need further instruction   ? ?  ?  ? ?  ? ? ? SLP Short Term Goals - 01/17/22 1224   ? ?  ? SLP SHORT TERM GOAL #1  ? Title pt will produce loud "ah" averaging 90 dB over 5 trials with rare min A across 2 sessions   ? Time 2   ? Period Weeks   ? Status On-going   ?  ? SLP SHORT TERM GOAL #2  ? Title pt will average 70-72 dB across 5 minute conversational sample   ? Time 2   ? Period Weeks   ? Status On-going   ?  ? SLP SHORT TERM GOAL #3  ? Title pt with average 70-72 dB at sentence level 18/20 sentences over 2 sessions   ?  Time 2   ? Period Weeks   ? Status On-going   ?  ? SLP SHORT TERM GOAL #4  ? Title pt will have 10 or less throat clears over 45 minute session   ? Time 2   ? Period Weeks   ? Status On-going   ? ?  ?  ? ?  ? ? ? SLP Long Term Goals - 01/17/22 1224   ? ?  ? SLP LONG TERM GOAL #1  ? Title pt will average 90 dB with x5 loud "ah" mod I over 3 sessions   ? Time 10   ? Period Weeks   ? Status On-going   ?  ? SLP LONG TERM GOAL #2  ? Title pt will average 70-72 dB over 20 minute conversation with rare min A   ? Time 10   ? Period Weeks   ? Status On-going   ?  ? SLP LONG TERM GOAL #3  ? Title pt/spouse report 50% decrease in requests for repetition subjectively at home   ? Time 10   ? Period Weeks   ? Status On-going   ?  ? SLP LONG TERM GOAL #4  ? Title pt/spouse will report compliance with vocal hygiene recommendations over 1 week period   ? Time 10   ? Period Weeks   ? Status On-going   ? ?   ?  ? ?  ? ? ? Plan - 01/17/22 1327   ? ? Clinical Impression Statement [name] has Parkinson's and presents with reduced vocal intensity/volume and hoarseness during speech.  Pt and wife endorse pt's low volume impacting communication in the home. Pt's spontaneous speech averages in low 60s dB. Pt scores Communication Participation Item Bank, scoring a total of 25/30. Pt reports "a little" difficulty communication quickly, talking with people he does not know, asking questions in conversations, giving detailed information, and getting a turn in a fast paced conversation. 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   ? Speech Therapy Frequency 2x / week   ? Duration 12 weeks   ? Treatment/Interventions Functional tasks;Cueing hierarchy;Environmental controls;Patient/family education;SLP instruction and feedback;Internal/external aids;Compensatory strategies;Compensatory techniques   ? Potential to Achieve Goals Good   ? Potential Considerations Medical prognosis   ? SLP Home Exercise Plan loud "ah"; oral reading, vocal glides   ? Consulted and Agree with Plan of Care Patient;Family member/caregiver   ? Family Member Consulted wife, [name]   ? ?  ?  ? ?  ? ? ?Patient will benefit from skilled therapeutic intervention in order to improve the following deficits and impairments:   ?Dysarthria ? ? ? ?Problem List ?Patient Active Problem List  ? Diagnosis Date Noted  ? Irregular heart rate 01/12/2022  ? OSA (obstructive sleep apnea) 01/12/2022  ? Parkinson's disease ([place]) 11/25/2020  ? Edema of right lower leg 11/25/2020  ? Hyperlipidemia 11/25/2020  ? 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 Hearing loss 11/25/2020  ? Pityriasis rosea 11/25/2020  ? ? ?Su Monks, CF-SLP ?01/17/2022, 1:27 PM ? ?[facility] ?[place], [place], [address] ?Phone: [phone]   Fax:  [phone] ? ? ?Name: [name] ?MRN: [id number] ?Date of  Birth: Dec 03, 1945 ? ?

## 2022-01-17 NOTE — Therapy (Signed)
[place] ?[place] ?[place], [place], [address] ?Phone: [phone]   Fax:  [phone] ? ?Occupational Therapy Treatment ? ?Patient Details  ?Name: [name] ?MRN: [id number] ?Date of Birth: 06/20/1946 ?Referring Provider (OT): Dr. Rexene Alberts ? ? ?Encounter Date: 01/17/2022 ? ? OT End of Session - 01/17/22 1527   ? ? Visit Number 4   ? Number of Visits 25   ? Date for OT Re-Evaluation 03/31/22   ? Authorization Type [employer]   ? Authorization Time Period 60 days- POC written for 12 weeks anticiapte d/c following 6-8 weeks   ? Authorization - Visit Number 4   ? Authorization - Number of Visits 10   ? Progress Note Due on Visit 10   ? OT Start Time 1318   ? OT Stop Time 1400   ? OT Time Calculation (min) 42 min   ? ?  ?  ? ?  ? ? ?Past Medical History:  ?Diagnosis Date  ? High cholesterol   ? Parkinson disease ([place])   ? ? ?Past Surgical History:  ?Procedure Laterality Date  ? TONSILLECTOMY    ? ? ?There were no vitals filed for this visit. ? ? Subjective Assessment - 01/17/22 1328   ? ? Subjective  Denies pain   ? Pertinent History PD diganosed in 2019   cellulitis, lymphedema, low back pain, hyperlipidemia,   ? Patient Stated Goals mobility, maintain independence   ? Currently in Pain? No/denies   ? ?  ?  ? ?  ? ? ? ? ? ?Treatment: functional overhead reaching in standing to copy small peg design, min-mod v.c for posture, and coordination ?Arm bike x 5 mins level for conditioning, pt maintained >40 rpm ? ? ? ? ? ? ? ? ? ? ? ? ? ? ? ? ? OT Education - 01/17/22 1525   ? ? Education Details PWR! basic 4 in seated  min v.c for amplitude,  and positioning, 10 reps each, education regarding big movements with ADLs and practice scooping food, and cutting food   ? Person(s) Educated Patient   ? Methods Explanation;Demonstration;Verbal cues;Handout   ? Comprehension Verbalized understanding;Returned demonstration;Verbal cues required   ? ?  ?  ? ?  ? ? ? OT Short  Term Goals - 01/06/22 0807   ? ?  ? OT SHORT TERM GOAL #1  ? Title I with PD specific HEP   ? Time 4   ? Period Weeks   ? Status New   ? Target Date 02/03/22   ?  ? OT SHORT TERM GOAL #2  ? Title Pt will verbalize understanding of adapted strategies to maximize safety and I with ADLs/ IADLs .   ? Time 4   ? Period Weeks   ? Status New   ?  ? OT SHORT TERM GOAL #3  ? Title Pt will demonstrate improved fine motor coordination for ADLs as evidenced by decreasing 9 hole peg test score for RUE by 3 secs   ? Baseline R 38.25, L 33.75   ? Time 4   ? Period Weeks   ? Status New   ?  ? OT SHORT TERM GOAL #4  ? Title Pt will demonstrated improved LUE functional use as evidenced by increasing box/ blocks by 3 blocks   ? Baseline R 45, L 39   ? Time 4   ? Period Weeks   ? Status New   ?  ? OT  SHORT TERM GOAL #5  ? Title Pt will verbalize understanding of ways to keep thinking skills sharp.   ? Time 4   ? Period Weeks   ? Status New   ? ?  ?  ? ?  ? ? ? ? OT Long Term Goals - 01/06/22 0809   ? ?  ? OT LONG TERM GOAL #1  ? Title Pt will verbalize understanding of ways to prevent future PD related complications and PD community resources.   ? Time 12   ? Period Weeks   ? Status New   ? Target Date 03/31/22   ?  ? OT LONG TERM GOAL #2  ? Title Pt will demonstrate improved ease with feeding as evidenced by decreasing PPT#2 (self feeding) by 3 secs   ? Baseline 14.19 secs   ? Time 12   ? Period Weeks   ? Status New   ?  ? OT LONG TERM GOAL #3  ? Title Pt will retrieve an item at 130 shoulder flexion and -20 elbow extension with RUE.   ? Baseline 125, -25   ? Time 12   ? Period Weeks   ? Status New   ?  ? OT LONG TERM GOAL #4  ? Title Pt will demonstrate ability to write a short paragraph with 100% legibility and no significant decrease in letter size.   ? Time 12   ? Period Weeks   ? Status New   ? ?  ?  ? ?  ? ? ? ? ? ? ? ? Plan - 01/17/22 1526   ? ? Clinical Impression Statement Pt is progressing towards goals. He demonstrates  understanding of  big movements with ADLS, but will benefit from reinforcement.   ? OT Occupational Profile and History Detailed Assessment- Review of Records and additional review of physical, cognitive, psychosocial history related to current functional performance   ? Occupational performance deficits (Please refer to evaluation for details): ADL's;IADL's;Leisure;Play;Social Participation   ? Body Structure / Function / Physical Skills ADL;Balance;Endurance;Mobility;Strength;Flexibility;UE functional use;FMC;Gait;Coordination;ROM;GMC;Decreased knowledge of precautions;Decreased knowledge of use of DME;IADL;Dexterity   ? Rehab Potential Good   ? Clinical Decision Making Limited treatment options, no task modification necessary   ? Comorbidities Affecting Occupational Performance: May have comorbidities impacting occupational performance   ? Modification or Assistance to Complete Evaluation  No modification of tasks or assist necessary to complete eval   ? OT Frequency 2x / week   plus eval, POC written for 12 weeks anticipate d/c after 6-8 weeks  ? OT Duration 12 weeks   ? OT Treatment/Interventions Self-care/ADL training;Therapeutic exercise;Functional Mobility Training;Balance training;Aquatic Therapy;Ultrasound;Neuromuscular education;Splinting;Manual Therapy;Therapeutic activities;DME and/or AE instruction;Cognitive remediation/compensation;Gait Training;Moist Heat;Fluidtherapy;Paraffin;Cryotherapy;Passive range of motion;Patient/family education   ? Plan address posture, supine PWR!, functional reaching   ? Consulted and Agree with Plan of Care Patient   ? ?  ?  ? ?  ? ? ?Patient will benefit from skilled therapeutic intervention in order to improve the following deficits and impairments:   ?Body Structure / Function / Physical Skills: ADL, Balance, Endurance, Mobility, Strength, Flexibility, UE functional use, FMC, Gait, Coordination, ROM, GMC, Decreased knowledge of precautions, Decreased knowledge of use  of DME, IADL, Dexterity ?  ?  ? ? ?Visit Diagnosis: ?Unsteadiness on feet ? ?Abnormal posture ? ?Other symptoms and signs involving the nervous system ? ?Other abnormalities of gait and mobility ? ? ? ?Problem List ?Patient Active Problem List  ? Diagnosis Date Noted  ? Irregular heart  rate 01/12/2022  ? OSA (obstructive sleep apnea) 01/12/2022  ? Parkinson's disease ([place]) 11/25/2020  ? Edema of right lower leg 11/25/2020  ? Hyperlipidemia 11/25/2020  ? 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 Hearing loss 11/25/2020  ? Pityriasis rosea 11/25/2020  ? ? ?Emani Morad, OT ?01/17/2022, 3:28 PM ? ?[place] ?[place] ?[place], [place], [address] ?Phone: [phone]   Fax:  [phone] ? ?Name: [name] ?MRN: [id number] ?Date of Birth: 05-23-1946 ? ?

## 2022-01-17 NOTE — Therapy (Signed)
?OUTPATIENT PHYSICAL THERAPY TREATMENT NOTE ? ? ?Patient Name: [name] ?MRN: [id number] ?DOB:[birth year], 76 y.o., male ?Today's Date: 01/17/2022 ? ?PCP: Lesleigh Noe, MD ?REFERRING PROVIDER: Star Age, MD  ? ? PT End of Session - 01/17/22 1452   ? ? Visit Number 4   ? Number of Visits 17   Plus eval  ? Date for PT Re-Evaluation 03/30/22   ? Authorization Type [employer]   ? Authorization Time Period Needs auth   ? PT Start Time 1401   ? PT Stop Time 3149   ? PT Time Calculation (min) 46 min   ? Equipment Utilized During Treatment Gait belt   ? Activity Tolerance Patient tolerated treatment well   ? Behavior During Therapy [facility] for tasks assessed/performed   ? ?  ?  ? ?  ? ? ? ?Past Medical History:  ?Diagnosis Date  ? High cholesterol   ? Parkinson disease ([place])   ? ?Past Surgical History:  ?Procedure Laterality Date  ? TONSILLECTOMY    ? ?Patient Active Problem List  ? Diagnosis Date Noted  ? Irregular heart rate 01/12/2022  ? OSA (obstructive sleep apnea) 01/12/2022  ? Parkinson's disease ([place]) 11/25/2020  ? Edema of right lower leg 11/25/2020  ? Hyperlipidemia 11/25/2020  ? 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 Hearing loss 11/25/2020  ? Pityriasis rosea 11/25/2020  ? ? ?REFERRING DIAG: G20 (ICD-10-CM) - Parkinson's disease ([place])  ? ?THERAPY DIAG:  ?Unsteadiness on feet ? ?Other abnormalities of gait and mobility ? ?Abnormal posture ? ?PERTINENT HISTORY: HLD, cellulitis, lymphedema, low back pain and hearing loss. ? ?PRECAUTIONS: Fall ? ?SUBJECTIVE: Pt brought RW to session and reports he is "ready to go to the next level with exercises".  ? ?PAIN:  ?Are you having pain? No ? ? ?TODAY'S TREATMENT:  ?The following treadmill training was completed for aerobic/neural priming, endurance, and gait speed. Mod verbal cues for upright posture and increased step length of LLE. Noted pt began walking w/heavy steps and absent heel strike. By end of warmup, pt demonstrated bilateral heel strike and minimal  stomping  ?- Warmup: 2:00 up to 1.0 mph ?- HIIT: 6:30 45sec ON/OFF alternating 1.0 / 2.0 mph   ? ?Boxing w/2 therapists (min A) for UE/LE coordination, stepping strategies and dual-tasking:  ?-Ipsilateral step w/cross-body punch, x5 per side  ?-Progressed to alt. Ipsilateral step w/cross-body punch, x10 per side  ?-Progressed to alt. ipsilateral step w/cross-body punch and contralateral undercut, x10 per side  ? ?Updated and practiced HEP in bold below w/S*. Heavy education regarding safe setup and having wife, [name], present during movement  ? ? ?PATIENT EDUCATION: ?Education details: Fall prevention at home, safety w/updated HEP ?Person educated: Patient ?Education method: Explanation and handout  ?Education comprehension: verbalized understanding ? ? ?HOME EXERCISE PROGRAM: ?Access Code: [email or web address] ?URL: [email or web address] ?Date: 01/17/2022 ?Prepared by: Mickie Bail Brendin Situ ? ?Exercises ?Sit to stand with band pull-apart - 1 x daily - 7 x weekly - 3 sets - 10 reps ?Step Sideways with Arms Reaching - 1 x daily - 7 x weekly - 3 sets - 10 reps ?Backward Walking with Counter Support - 1 x daily - 7 x weekly - 3 sets - 10 reps ?Seated Windmill Trunk Rotation Stretch - 1 x daily - 7 x weekly - 3 sets - 10 reps ?Standing Diagonal Chop - 1 x daily - 7 x weekly - 3 sets - 10 reps w/red theraband, min verbal cues to look at hands during movement  ?  Standing banded pull-throughs w/red theraband - 1x daily - 7x weekly -3 sets - 10 reps   ? ? ? PT Short Term Goals - 01/06/22 0806   ? ?  ? PT SHORT TERM GOAL #1  ? Title Pt will be independent with initial HEP with wife's supervision in order to build upon functional gains made in therapy. ALL STGS DUE 02/03/22   ? Time 4   ? Period Weeks   ? Status New   ? Target Date 02/03/22   ?  ? PT SHORT TERM GOAL #2  ? Title Pt will maintain gait speed of 2.8 ft/sec with LRAD with supervision in order to demo improved community ambulation.   ? Baseline 2.77 ft/sec with no AD -  min guard.   ? Time 4   ? Period Weeks   ? Status New   ?  ? PT SHORT TERM GOAL #3  ? Title Pt will improve 5x sit <> stand with no UE support to 19 seconds or less in order to demo improved functional strength/decr fall risk.   ? Baseline 22.97 seconds   ? Time 4   ? Period Weeks   ? Status New   ?  ? PT SHORT TERM GOAL #4  ? Title Pt will ambulate at least 230' over level indoor surfaces with LRAD with supervision for improved safety with mobility.   ? Baseline no AD- needs min guard and min A for balance at times.   ? Time 4   ? Period Weeks   ? Status New   ?  ? PT SHORT TERM GOAL #5  ? Title Pt and pt's wife will verbalize understanding of fall prevention in the home.   ? Time 4   ? Period Weeks   ? Status New   ? ?  ?  ? ?  ? ? ? PT Long Term Goals - 01/06/22 0818   ? ?  ? PT LONG TERM GOAL #1  ? Title Pt will be independent with final HEP with wife's supervision in order to build upon functional gains made in therapy. ALL LTGS DUE 03/03/22   ? Time 8   ? Period Weeks   ? Status New   ? Target Date 03/03/22   ?  ? PT LONG TERM GOAL #2  ? Title Pt will improve miniBEST to at least a 18/28 in order to demo decr fall risk.   ? Baseline 12/28   ? Time 8   ? Period Weeks   ? Status New   ?  ? PT LONG TERM GOAL #3  ? Title Pt will improve 5x sit <> stand with no UE support to 15 seconds or less in order to demo improved functional strength/decr fall risk.   ? Baseline 22.97 seconds   ? Time 8   ? Period Weeks   ? Status New   ?  ? PT LONG TERM GOAL #4  ? Title Pt will ambulate at least 400' over outdoor paved surfaces with LRAD with mod I in order to demo improved community mobility.   ? Time 8   ? Period Weeks   ? Status New   ?  ? PT LONG TERM GOAL #5  ? Title Pt will verbalize understanding of local Parkinson's disease resources,   ? Time 8   ? Period Weeks   ? Status New   ?  ? Additional Long Term Goals  ? Additional Long Term Goals Yes   ?  ?  PT LONG TERM GOAL #6  ? Title Pt will recover posterior and anterior  balance in push and release test in 2 steps independently, for improved balance recovery   ? Time 8   ? Period Weeks   ? Status New   ? ?  ?  ? ?  ? ? ? Plan - 01/17/22 1507   ? ? Clinical Impression Statement Emphasis of skilled session on improved gait kinematics, UE/LE coordination, dual-tasking and stepping strategies. Pt demonstrates improvement with posture and is able to self-correct without need for concurrent verbal cues. Pt continues to stomp during gait and could benefit from continued gait training on TM to improve step length and kinematics. Continue POC   ? Personal Factors and Comorbidities Age;Fitness;Past/Current Experience;Comorbidity 1;Time since onset of injury/illness/exacerbation   ? Comorbidities Cellulitis   ? Examination-Activity Limitations Bend;Carry;Reach Overhead;Locomotion Level;Lift;Stairs;Squat;Stand;Transfers   ? Examination-Participation Restrictions Community Activity   ? Stability/Clinical Decision Making Evolving/Moderate complexity   ? Rehab Potential Good   ? PT Frequency 2x / week   ? PT Duration 12 weeks   ? PT Treatment/Interventions ADLs/Self Care Home Management;Gait training;DME Instruction;Stair training;Functional mobility training;Therapeutic activities;Therapeutic exercise;Balance training;Neuromuscular re-education;Cognitive remediation;Patient/family education;Energy conservation;Vestibular   ? PT Next Visit Plan TM training, boxing progression, DL progression, rebounder stepping   ? PT Home Exercise Plan [email or web address]   ? Consulted and Agree with Plan of Care Patient;Family member/caregiver   ? Family Member Consulted Wife, [name]   ? ?  ?  ? ?  ? ? ? ? ? ?Cruzita Lederer Clydean Posas, PT, DPT ?01/17/2022, 3:11 PM ? ?   ?

## 2022-01-19 ENCOUNTER — Other Ambulatory Visit: Payer: Self-pay

## 2022-01-19 ENCOUNTER — Ambulatory Visit: Payer: Medicare HMO | Admitting: Physical Therapy

## 2022-01-19 ENCOUNTER — Ambulatory Visit: Payer: Medicare HMO | Admitting: Speech Pathology

## 2022-01-19 DIAGNOSIS — R2681 Unsteadiness on feet: Secondary | ICD-10-CM | POA: Diagnosis not present

## 2022-01-19 DIAGNOSIS — R2689 Other abnormalities of gait and mobility: Secondary | ICD-10-CM | POA: Diagnosis not present

## 2022-01-19 DIAGNOSIS — R471 Dysarthria and anarthria: Secondary | ICD-10-CM

## 2022-01-19 DIAGNOSIS — R293 Abnormal posture: Secondary | ICD-10-CM

## 2022-01-19 DIAGNOSIS — R29818 Other symptoms and signs involving the nervous system: Secondary | ICD-10-CM | POA: Diagnosis not present

## 2022-01-19 DIAGNOSIS — R278 Other lack of coordination: Secondary | ICD-10-CM | POA: Diagnosis not present

## 2022-01-19 NOTE — Therapy (Signed)
[place] ?[place] ?[place], [place], [address] ?Phone: [phone]   Fax:  [phone] ? ?Speech Language Pathology Treatment ? ?Patient Details  ?Name: [name] ?MRN: [id number] ?Date of Birth: April 24, 1946 ?Referring Provider (SLP): Dr. Rexene Alberts ? ? ?Encounter Date: 01/19/2022 ? ? End of Session - 01/19/22 1313   ? ? Visit Number 5   ? Number of Visits 25   ? Date for SLP Re-Evaluation 04/07/22   ? SLP Start Time 1315   ? SLP Stop Time  1400   ? SLP Time Calculation (min) 45 min   ? Activity Tolerance Patient tolerated treatment well   ? ?  ?  ? ?  ? ? ?Past Medical History:  ?Diagnosis Date  ? High cholesterol   ? Parkinson disease ([place])   ? ? ?Past Surgical History:  ?Procedure Laterality Date  ? TONSILLECTOMY    ? ? ?There were no vitals filed for this visit. ? ? Subjective Assessment - 01/19/22 1314   ? ? Subjective "Most recently I went to [employer] for lunch"   ? Currently in Pain? No/denies   ? ?  ?  ? ?  ? ? ? ? ? ? ? ? ADULT SLP TREATMENT - 01/19/22 1318   ? ?  ? General Information  ? Behavior/Cognition Alert;Cooperative;Pleasant mood   ?  ? Treatment Provided  ? Treatment provided Cognitive-Linquistic   ?  ? Cognitive-Linquistic Treatment  ? 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 Skilled Treatment Target maintaing adequate vocal intensity for conversational speech (70-71 dB) across increasingly complex speech tasks, while maintaining clear vocal quality. Min A for loud "ah," pt averaging 86 dB. Phrase level reading, average 70 dB, rare min A. Cognitive exercise, averages 74 dB rare min A. Conversational speech sample, ~4 minutes, loudness varies throughout, 68-72 dB but overall higher average than previous sessions- 67 dB. ST able to hear over low volume radio on in background. Decreased throat clears observed this session, pt reports to using swallow and drinking water to prevent with success.   ?  ? Assessment / Recommendations /  Plan  ? Plan Continue with current plan of care   ? ?  ?  ? ?  ? ? ? SLP Education - 01/19/22 1502   ? ? Education Details Using intent for speech; voice exs   ? Person(s) Educated Patient   ? Methods Explanation;Demonstration;Handout   ? Comprehension Verbalized understanding;Returned demonstration   ? ?  ?  ? ?  ? ? ? SLP Short Term Goals - 01/19/22 1313   ? ?  ? SLP SHORT TERM GOAL #1  ? Title pt will produce loud "ah" averaging 90 dB over 5 trials with rare min A across 2 sessions   ? Time 2   ? Period Weeks   ? Status On-going   ?  ? SLP SHORT TERM GOAL #2  ? Title pt will average 70-72 dB across 5 minute conversational sample   ? Time 2   ? Period Weeks   ? Status On-going   ?  ? SLP SHORT TERM GOAL #3  ? Title pt with average 70-72 dB at sentence level 18/20 sentences over 2 sessions   ? Time 2   ? Period Weeks   ? Status On-going   ?  ? SLP SHORT TERM GOAL #4  ? Title pt will have 10 or less throat clears over 45 minute session   ? Time  2   ? Period Weeks   ? Status On-going   ? ?  ?  ? ?  ? ? ? SLP Long Term Goals - 01/19/22 1314   ? ?  ? SLP LONG TERM GOAL #1  ? Title pt will average 90 dB with x5 loud "ah" mod I over 3 sessions   ? Time 10   ? Period Weeks   ? Status On-going   ?  ? SLP LONG TERM GOAL #2  ? Title pt will average 70-72 dB over 20 minute conversation with rare min A   ? Time 10   ? Period Weeks   ? Status On-going   ?  ? SLP LONG TERM GOAL #3  ? Title pt/spouse report 50% decrease in requests for repetition subjectively at home   ? Time 10   ? Period Weeks   ? Status On-going   ?  ? SLP LONG TERM GOAL #4  ? Title pt/spouse will report compliance with vocal hygiene recommendations over 1 week period   ? Time 10   ? Period Weeks   ? Status On-going   ? ?  ?  ? ?  ? ? ? Plan - 01/19/22 1313   ? ? Clinical Impression Statement [name] has Parkinson's and presents with reduced vocal intensity/volume and hoarseness during speech.  Pt and wife endorse pt's low volume impacting communication in  the home. Pt's spontaneous speech averages in low 60s dB. Pt scores Communication Participation Item Bank, scoring a total of 25/30. Pt reports "a little" difficulty communication quickly, talking with people he does not know, asking questions in conversations, giving detailed information, and getting a turn in a fast paced conversation. 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   ? Speech Therapy Frequency 2x / week   ? Duration 12 weeks   ? Treatment/Interventions Functional tasks;Cueing hierarchy;Environmental controls;Patient/family education;SLP instruction and feedback;Internal/external aids;Compensatory strategies;Compensatory techniques   ? Potential to Achieve Goals Good   ? Potential Considerations Medical prognosis   ? SLP Home Exercise Plan loud "ah"; oral reading, vocal glides   ? Consulted and Agree with Plan of Care Patient;Family member/caregiver   ? Family Member Consulted wife, [name]   ? ?  ?  ? ?  ? ? ?Patient will benefit from skilled therapeutic intervention in order to improve the following deficits and impairments:   ?Dysarthria ? ? ? ?Problem List ?Patient Active Problem List  ? Diagnosis Date Noted  ? Irregular heart rate 01/12/2022  ? OSA (obstructive sleep apnea) 01/12/2022  ? Parkinson's disease ([place]) 11/25/2020  ? Edema of right lower leg 11/25/2020  ? Hyperlipidemia 11/25/2020  ? 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 Hearing loss 11/25/2020  ? Pityriasis rosea 11/25/2020  ? ? ?Su Monks, CF-SLP ?01/19/2022, 3:03 PM ? ?[place] ?[place] ?[place], [place], [address] ?Phone: [phone]   Fax:  [phone] ? ? ?Name: [name] ?MRN: [id number] ?Date of Birth: 1946/07/12 ? ?

## 2022-01-19 NOTE — Therapy (Signed)
?OUTPATIENT PHYSICAL THERAPY TREATMENT NOTE ? ? ?Patient Name: [name] ?MRN: [id number] ?DOB:12-Jul-1946, 76 y.o., male ?Today's Date: 01/19/2022 ? ?PCP: Lesleigh Noe, MD ?REFERRING PROVIDER: Star Age, MD  ? ? PT End of Session - 01/19/22 1545   ? ? Visit Number 5   ? Number of Visits 17   Plus eval  ? Date for PT Re-Evaluation 03/30/22   ? Authorization Type [employer]   ? Authorization Time Period Needs auth   ? PT Start Time 0258   ? PT Stop Time 5277   ? PT Time Calculation (min) 43 min   ? Equipment Utilized During Treatment Gait belt   ? Activity Tolerance Patient tolerated treatment well   ? Behavior During Therapy [facility] for tasks assessed/performed   ? ?  ?  ? ?  ? ? ? ? ?Past Medical History:  ?Diagnosis Date  ? High cholesterol   ? Parkinson disease ([place])   ? ?Past Surgical History:  ?Procedure Laterality Date  ? TONSILLECTOMY    ? ?Patient Active Problem List  ? Diagnosis Date Noted  ? Irregular heart rate 01/12/2022  ? OSA (obstructive sleep apnea) 01/12/2022  ? Parkinson's disease ([place]) 11/25/2020  ? Edema of right lower leg 11/25/2020  ? Hyperlipidemia 11/25/2020  ? 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 Hearing loss 11/25/2020  ? Pityriasis rosea 11/25/2020  ? ? ?REFERRING DIAG: G20 (ICD-10-CM) - Parkinson's disease ([place])  ? ?THERAPY DIAG:  ?Unsteadiness on feet ? ?Other abnormalities of gait and mobility ? ?Abnormal posture ? ?PERTINENT HISTORY: HLD, cellulitis, lymphedema, low back pain and hearing loss. ? ?PRECAUTIONS: Fall ? ?SUBJECTIVE: No new changes. Pt reports exercises are going well and are getting easier.  ? ?PAIN:  ?Are you having pain? No ? ? ?TODAY'S TREATMENT:  ?NMR  ?At rebounder for lateral weight shifting, dual-tasking and dynamic balance: ?-Side step w/overhead ball throw and catch w/1kg ball, x15 per side w/min-mod A for LOB correction (posterolaterally to L side). Min verbal cues for improved step length and clearance and min tactile cues to anterior shoulder for  upright posture  ?-Progressed to forward lunge w/contralateral rotation and ball throw, x10 per side. Pt unable to shift weight to front leg to rotate and throw, so regressed to forward lunge with rotation over stance leg to initiate lateral weight shift.   ? ?DL/KB swing set-switching w/10# KB for posterior chain strengthening and high amplitude movement, x15 reps w/min multimodal cues for form and CGA for safety. ? ? ?PATIENT EDUCATION: ?Education details: Fall prevention at home, freezing strategies  ?Person educated: Patient ?Education method: Explanation and handout  ?Education comprehension: verbalized understanding ? ? ?HOME EXERCISE PROGRAM: ?Access Code: [email or web address] ?URL: [email or web address] ?Date: 01/17/2022 ?Prepared by: Mickie Bail Kelseigh Diver ? ?Exercises ?Sit to stand with band pull-apart - 1 x daily - 7 x weekly - 3 sets - 10 reps ?Step Sideways with Arms Reaching - 1 x daily - 7 x weekly - 3 sets - 10 reps ?Backward Walking with Counter Support - 1 x daily - 7 x weekly - 3 sets - 10 reps ?Seated Windmill Trunk Rotation Stretch - 1 x daily - 7 x weekly - 3 sets - 10 reps ?Standing Diagonal Chop - 1 x daily - 7 x weekly - 3 sets - 10 reps w/red theraband, min verbal cues to look at hands during movement  ?Standing banded pull-throughs w/red theraband - 1x daily - 7x weekly -3 sets - 10 reps   ? ? ?  PT Short Term Goals - 01/06/22 0806   ? ?  ? PT SHORT TERM GOAL #1  ? Title Pt will be independent with initial HEP with wife's supervision in order to build upon functional gains made in therapy. ALL STGS DUE 02/03/22   ? Time 4   ? Period Weeks   ? Status New   ? Target Date 02/03/22   ?  ? PT SHORT TERM GOAL #2  ? Title Pt will maintain gait speed of 2.8 ft/sec with LRAD with supervision in order to demo improved community ambulation.   ? Baseline 2.77 ft/sec with no AD - min guard.   ? Time 4   ? Period Weeks   ? Status New   ?  ? PT SHORT TERM GOAL #3  ? Title Pt will improve 5x sit <> stand with no  UE support to 19 seconds or less in order to demo improved functional strength/decr fall risk.   ? Baseline 22.97 seconds   ? Time 4   ? Period Weeks   ? Status New   ?  ? PT SHORT TERM GOAL #4  ? Title Pt will ambulate at least 230' over level indoor surfaces with LRAD with supervision for improved safety with mobility.   ? Baseline no AD- needs min guard and min A for balance at times.   ? Time 4   ? Period Weeks   ? Status New   ?  ? PT SHORT TERM GOAL #5  ? Title Pt and pt's wife will verbalize understanding of fall prevention in the home.   ? Time 4   ? Period Weeks   ? Status New   ? ?  ?  ? ?  ? ? ? PT Long Term Goals - 01/06/22 0818   ? ?  ? PT LONG TERM GOAL #1  ? Title Pt will be independent with final HEP with wife's supervision in order to build upon functional gains made in therapy. ALL LTGS DUE 03/03/22   ? Time 8   ? Period Weeks   ? Status New   ? Target Date 03/03/22   ?  ? PT LONG TERM GOAL #2  ? Title Pt will improve miniBEST to at least a 18/28 in order to demo decr fall risk.   ? Baseline 12/28   ? Time 8   ? Period Weeks   ? Status New   ?  ? PT LONG TERM GOAL #3  ? Title Pt will improve 5x sit <> stand with no UE support to 15 seconds or less in order to demo improved functional strength/decr fall risk.   ? Baseline 22.97 seconds   ? Time 8   ? Period Weeks   ? Status New   ?  ? PT LONG TERM GOAL #4  ? Title Pt will ambulate at least 400' over outdoor paved surfaces with LRAD with mod I in order to demo improved community mobility.   ? Time 8   ? Period Weeks   ? Status New   ?  ? PT LONG TERM GOAL #5  ? Title Pt will verbalize understanding of local Parkinson's disease resources,   ? Time 8   ? Period Weeks   ? Status New   ?  ? Additional Long Term Goals  ? Additional Long Term Goals Yes   ?  ? PT LONG TERM GOAL #6  ? Title Pt will recover posterior and anterior balance in  push and release test in 2 steps independently, for improved balance recovery   ? Time 8   ? Period Weeks   ? Status New    ? ?  ?  ? ?  ? ? ? Plan - 01/19/22 1620   ? ? Clinical Impression Statement Emphasis of skilled session on lateral weight shifitng, set-switching and dynamic balance. Pt demonstrates significant difficulty w/unweighting LLE and rotation to R side. Pt continues to demonstrate significant flexed posture and trunk flexion w/balance activity but has improved with self-correction without external cues. Pt's gait pattern improving, with pt demonstrating heel strike and standing more erect w/RW. Continue POC.   ? Personal Factors and Comorbidities Age;Fitness;Past/Current Experience;Comorbidity 1;Time since onset of injury/illness/exacerbation   ? Comorbidities Cellulitis   ? Examination-Activity Limitations Bend;Carry;Reach Overhead;Locomotion Level;Lift;Stairs;Squat;Stand;Transfers   ? Examination-Participation Restrictions Community Activity   ? Stability/Clinical Decision Making Evolving/Moderate complexity   ? Rehab Potential Good   ? PT Frequency 2x / week   ? PT Duration 12 weeks   ? PT Treatment/Interventions ADLs/Self Care Home Management;Gait training;DME Instruction;Stair training;Functional mobility training;Therapeutic activities;Therapeutic exercise;Balance training;Neuromuscular re-education;Cognitive remediation;Patient/family education;Energy conservation;Vestibular   ? PT Next Visit Plan TM training, boxing progression, DL progression, rebounder stepping   ? PT Home Exercise Plan [email or web address]   ? Consulted and Agree with Plan of Care Patient;Family member/caregiver   ? Family Member Consulted Wife, [name]   ? ?  ?  ? ?  ? ? ? ? ? ? ?Cruzita Lederer Kanye Depree, PT, DPT ?01/19/2022, 4:23 PM ? ?   ?

## 2022-01-24 ENCOUNTER — Encounter: Payer: Self-pay | Admitting: Occupational Therapy

## 2022-01-24 ENCOUNTER — Ambulatory Visit: Payer: Medicare HMO | Admitting: Physical Therapy

## 2022-01-24 ENCOUNTER — Ambulatory Visit: Payer: Medicare HMO | Admitting: Occupational Therapy

## 2022-01-24 ENCOUNTER — Ambulatory Visit: Payer: Medicare HMO | Admitting: Speech Pathology

## 2022-01-24 ENCOUNTER — Other Ambulatory Visit: Payer: Self-pay

## 2022-01-24 DIAGNOSIS — R2689 Other abnormalities of gait and mobility: Secondary | ICD-10-CM

## 2022-01-24 DIAGNOSIS — R278 Other lack of coordination: Secondary | ICD-10-CM | POA: Diagnosis not present

## 2022-01-24 DIAGNOSIS — R471 Dysarthria and anarthria: Secondary | ICD-10-CM | POA: Diagnosis not present

## 2022-01-24 DIAGNOSIS — R293 Abnormal posture: Secondary | ICD-10-CM | POA: Diagnosis not present

## 2022-01-24 DIAGNOSIS — R2681 Unsteadiness on feet: Secondary | ICD-10-CM

## 2022-01-24 DIAGNOSIS — R29818 Other symptoms and signs involving the nervous system: Secondary | ICD-10-CM

## 2022-01-24 NOTE — Therapy (Signed)
?OUTPATIENT PHYSICAL THERAPY TREATMENT NOTE ? ? ?Patient Name: [name] ?MRN: [id number] ?DOB:[birth year], 76 y.o., male ?Today's Date: 01/24/2022 ? ?PCP: Lesleigh Noe, MD ?REFERRING PROVIDER: Star Age, MD  ? ? PT End of Session - 01/24/22 1339   ? ? Visit Number 6   ? Number of Visits 17   Plus eval  ? Date for PT Re-Evaluation 03/30/22   ? Authorization Type [employer]   ? Authorization Time Period Needs auth   ? PT Start Time 1230   ? PT Stop Time 1318   ? PT Time Calculation (min) 48 min   ? Equipment Utilized During Treatment Gait belt   ? Activity Tolerance Patient tolerated treatment well   ? Behavior During Therapy [facility] for tasks assessed/performed   ? ?  ?  ? ?  ? ? ? ? ? ?Past Medical History:  ?Diagnosis Date  ? High cholesterol   ? Parkinson disease ([place])   ? ?Past Surgical History:  ?Procedure Laterality Date  ? TONSILLECTOMY    ? ?Patient Active Problem List  ? Diagnosis Date Noted  ? Irregular heart rate 01/12/2022  ? OSA (obstructive sleep apnea) 01/12/2022  ? Parkinson's disease ([place]) 11/25/2020  ? Edema of right lower leg 11/25/2020  ? Hyperlipidemia 11/25/2020  ? 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 Hearing loss 11/25/2020  ? Pityriasis rosea 11/25/2020  ? ? ?REFERRING DIAG: G20 (ICD-10-CM) - Parkinson's disease ([place])  ? ?THERAPY DIAG:  ?Unsteadiness on feet ? ?Other abnormalities of gait and mobility ? ?Abnormal posture ? ?PERTINENT HISTORY: HLD, cellulitis, lymphedema, low back pain and hearing loss. ? ?PRECAUTIONS: Fall ? ?SUBJECTIVE: No new changes. Pt reports exercises are going well and are getting easier.  ? ?PAIN:  ?Are you having pain? No ? ? ?TODAY'S TREATMENT:  ?Gait Training   ?The following treadmill training was completed for aerobic/neural priming, endurance, and gait speed.  ?- Warmup: 2:00 up to 2.0 mph ?- HIIT: 5:00 30sec ON/OFF alternating ball kicks and normal  gait  ?-Cool down: 1:00 at 1.6 mph  ? ?  Gait training w/green theraband tied on anterior RW to promote  improved step length, heel strike and upright posture  ?Gait pattern: step through pattern, Left foot flat, and trunk flexed ?Distance walked: >300' ?Assistive device utilized: Environmental consultant - 2 wheeled and green theraband ?Level of assistance: SBA ?Comments: Min verbal cues to "kick towards the band" and noted significantly improved step length and symmetry. Min verbal cues to avoid looking down at feet w/gait to reduce kyphotic posture and improve safety. Pt able to feel band w/BLEs as tactile cue that he is taking larger steps.  ? ?NMR  ?Mini squat w/ 2kg weighted ball throw to 2 post-its placed 2' above pt to L and R to facilitate anterior weight shift, symmetric UE flexion and lateral weight shift. Placed chair behind pt for safety and provided min guard throughout. Pt demonstrated significant difficulty throwing ball to post-it due to inability to un-weight LLE and rotate to R side. Progressed to having pt name foods w/each throw to introduce dual-task. Progressed to having pt name fruits w/L post-it and vegetables w/R post-it and pt demonstrated significant difficulty w/set-switching.  ? ? ?PATIENT EDUCATION: ?Education details: Walking w/RW at home w/green theraband, counting steps for improved step length.  ?Person educated: Patient ?Education method: Explanation and handout  ?Education comprehension: verbalized understanding ? ? ?HOME EXERCISE PROGRAM: ?Access Code: [email or web address] ?URL: [email or web address] ?Date: 01/17/2022 ?Prepared by: Mickie Bail Issacc Merlo ? ?Exercises ?  Sit to stand with band pull-apart - 1 x daily - 7 x weekly - 3 sets - 10 reps ?Step Sideways with Arms Reaching - 1 x daily - 7 x weekly - 3 sets - 10 reps ?Backward Walking with Counter Support - 1 x daily - 7 x weekly - 3 sets - 10 reps ?Seated Windmill Trunk Rotation Stretch - 1 x daily - 7 x weekly - 3 sets - 10 reps ?Standing Diagonal Chop - 1 x daily - 7 x weekly - 3 sets - 10 reps w/red theraband, min verbal cues to look at hands  during movement  ?Standing banded pull-throughs w/red theraband - 1x daily - 7x weekly -3 sets - 10 reps   ? ? ? PT Short Term Goals - 01/06/22 0806   ? ?  ? PT SHORT TERM GOAL #1  ? Title Pt will be independent with initial HEP with wife's supervision in order to build upon functional gains made in therapy. ALL STGS DUE 02/03/22   ? Time 4   ? Period Weeks   ? Status New   ? Target Date 02/03/22   ?  ? PT SHORT TERM GOAL #2  ? Title Pt will maintain gait speed of 2.8 ft/sec with LRAD with supervision in order to demo improved community ambulation.   ? Baseline 2.77 ft/sec with no AD - min guard.   ? Time 4   ? Period Weeks   ? Status New   ?  ? PT SHORT TERM GOAL #3  ? Title Pt will improve 5x sit <> stand with no UE support to 19 seconds or less in order to demo improved functional strength/decr fall risk.   ? Baseline 22.97 seconds   ? Time 4   ? Period Weeks   ? Status New   ?  ? PT SHORT TERM GOAL #4  ? Title Pt will ambulate at least 230' over level indoor surfaces with LRAD with supervision for improved safety with mobility.   ? Baseline no AD- needs min guard and min A for balance at times.   ? Time 4   ? Period Weeks   ? Status New   ?  ? PT SHORT TERM GOAL #5  ? Title Pt and pt's wife will verbalize understanding of fall prevention in the home.   ? Time 4   ? Period Weeks   ? Status New   ? ?  ?  ? ?  ? ? ? PT Long Term Goals - 01/06/22 0818   ? ?  ? PT LONG TERM GOAL #1  ? Title Pt will be independent with final HEP with wife's supervision in order to build upon functional gains made in therapy. ALL LTGS DUE 03/03/22   ? Time 8   ? Period Weeks   ? Status New   ? Target Date 03/03/22   ?  ? PT LONG TERM GOAL #2  ? Title Pt will improve miniBEST to at least a 18/28 in order to demo decr fall risk.   ? Baseline 12/28   ? Time 8   ? Period Weeks   ? Status New   ?  ? PT LONG TERM GOAL #3  ? Title Pt will improve 5x sit <> stand with no UE support to 15 seconds or less in order to demo improved functional  strength/decr fall risk.   ? Baseline 22.97 seconds   ? Time 8   ? Period Weeks   ?  Status New   ?  ? PT LONG TERM GOAL #4  ? Title Pt will ambulate at least 400' over outdoor paved surfaces with LRAD with mod I in order to demo improved community mobility.   ? Time 8   ? Period Weeks   ? Status New   ?  ? PT LONG TERM GOAL #5  ? Title Pt will verbalize understanding of local Parkinson's disease resources,   ? Time 8   ? Period Weeks   ? Status New   ?  ? Additional Long Term Goals  ? Additional Long Term Goals Yes   ?  ? PT LONG TERM GOAL #6  ? Title Pt will recover posterior and anterior balance in push and release test in 2 steps independently, for improved balance recovery   ? Time 8   ? Period Weeks   ? Status New   ? ?  ?  ? ?  ? ? ? Plan - 01/24/22 1339   ? ? Clinical Impression Statement Emphasis of skilled session on gait training, dual-tasking and high amplitude movement. Pt continues to demonstrate difficulty w/rotation to R side due to diffulty unweighting LLE. Pt is starting to demonstrate greater step length bilaterally and does well w/external cues. Challenged pt to use green theraband on RW as tactile cue for step length and maintain forward gaze for improved posture. Continue POC.   ? Personal Factors and Comorbidities Age;Fitness;Past/Current Experience;Comorbidity 1;Time since onset of injury/illness/exacerbation   ? Comorbidities Cellulitis   ? Examination-Activity Limitations Bend;Carry;Reach Overhead;Locomotion Level;Lift;Stairs;Squat;Stand;Transfers   ? Examination-Participation Restrictions Community Activity   ? Stability/Clinical Decision Making Evolving/Moderate complexity   ? Rehab Potential Good   ? PT Frequency 2x / week   ? PT Duration 12 weeks   ? PT Treatment/Interventions ADLs/Self Care Home Management;Gait training;DME Instruction;Stair training;Functional mobility training;Therapeutic activities;Therapeutic exercise;Balance training;Neuromuscular re-education;Cognitive  remediation;Patient/family education;Energy conservation;Vestibular   ? PT Next Visit Plan TM training, boxing progression, DL progression, rebounder stepping, clock drill, boom whackers   ? PT Home Exercise Plan [email or web address]

## 2022-01-24 NOTE — Therapy (Signed)
[facility] ?[place] ?[place], [place], [address] ?Phone: [phone]   Fax:  [phone] ? ?Speech Language Pathology Treatment ? ?Patient Details  ?Name: [name] ?MRN: [id number] ?Date of Birth: [birth year] ?Referring Provider (SLP): Dr. Rexene Alberts ? ? ?Encounter Date: 01/24/2022 ? ? End of Session - 01/24/22 1400   ? ? Visit Number 6   ? Number of Visits 25   ? Date for SLP Re-Evaluation 04/07/22   ? SLP Start Time 1400   ? SLP Stop Time  1445   ? SLP Time Calculation (min) 45 min   ? Activity Tolerance Patient tolerated treatment well   ? ?  ?  ? ?  ? ? ?Past Medical History:  ?Diagnosis Date  ? High cholesterol   ? Parkinson disease ([place])   ? ? ?Past Surgical History:  ?Procedure Laterality Date  ? TONSILLECTOMY    ? ? ?There were no vitals filed for this visit. ? ? Subjective Assessment - 01/24/22 1404   ? ? Subjective "I practiced three rooms away and [name] could hear me."   ? Currently in Pain? No/denies   ? ?  ?  ? ?  ? ? ? ? ? ? ? ? ADULT SLP TREATMENT - 01/24/22 1408   ? ?  ? General Information  ? Behavior/Cognition Alert;Cooperative;Pleasant mood   ?  ? Treatment Provided  ? Treatment provided Cognitive-Linquistic   ?  ? Cognitive-Linquistic Treatment  ? 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 Skilled Treatment Reports compliance with HEP, with benefit. Tells ST wife is asking for repetitions less often. Using increasingly complex speech tasks, pt able to maintain target dB (70-75 dB) with rare min A this date. Decreased intensity in conversation, despite providing pt with leading cues prior to pt engaging in sample. Average dB in conversation 65 dB. Pt with decreased instances of throat clears this date, pt noting and swallowing mod I.   ?  ? Assessment / Recommendations / Plan  ? Plan Continue with current plan of care   ?  ? Progression Toward Goals  ? Progression toward goals Progressing toward goals   ? ?  ?  ? ?  ? ? ? SLP  Education - 01/24/22 1452   ? ? Education Details conversational volume, throat clearing cessation, HEP   ? Person(s) Educated Patient   ? Methods Explanation;Demonstration;Handout   ? Comprehension Verbalized understanding;Returned demonstration   ? ?  ?  ? ?  ? ? ? SLP Short Term Goals - 01/24/22 1359   ? ?  ? SLP SHORT TERM GOAL #1  ? Title pt will produce loud "ah" averaging 90 dB over 5 trials with rare min A across 2 sessions   ? Baseline 01/24/22   ? Time 1   ? Period Weeks   ? Status On-going   ?  ? SLP SHORT TERM GOAL #2  ? Title pt will average 70-72 dB across 5 minute conversational sample   ? Time 1   ? Period Weeks   ? Status On-going   ?  ? SLP SHORT TERM GOAL #3  ? Title pt with average 70-72 dB at sentence level 18/20 sentences over 2 sessions   ? Baseline 01/24/22   ? Time 1   ? Period Weeks   ? Status On-going   ?  ? SLP SHORT TERM GOAL #4  ? Title pt will have 10 or less throat clears over 45 minute session   ?  Baseline 01/24/22   ? Time 1   ? Period Weeks   ? Status On-going   ? ?  ?  ? ?  ? ? ? SLP Long Term Goals - 01/24/22 1359   ? ?  ? SLP LONG TERM GOAL #1  ? Title pt will average 90 dB with x5 loud "ah" mod I over 3 sessions   ? Time 9   ? Period Weeks   ? Status On-going   ?  ? SLP LONG TERM GOAL #2  ? Title pt will average 70-72 dB over 20 minute conversation with rare min A   ? Time 9   ? Period Weeks   ? Status On-going   ?  ? SLP LONG TERM GOAL #3  ? Title pt/spouse report 50% decrease in requests for repetition subjectively at home   ? Time 9   ? Period Weeks   ? Status On-going   ?  ? SLP LONG TERM GOAL #4  ? Title pt/spouse will report compliance with vocal hygiene recommendations over 1 week period   ? Time 9   ? Period Weeks   ? Status On-going   ? ?  ?  ? ?  ? ? ? Plan - 01/24/22 1359   ? ? Clinical Impression Statement Lavel Rieman has Parkinson's and presents with reduced vocal intensity/volume and hoarseness during speech.  Pt and wife endorse pt's low volume impacting  communication in the home. Pt's spontaneous speech averages in low 60s dB. Pt scores Communication Participation Item Bank, scoring a total of 25/30. Pt reports "a little" difficulty communication quickly, talking with people he does not know, asking questions in conversations, giving detailed information, and getting a turn in a fast paced conversation. 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   ? Speech Therapy Frequency 2x / week   ? Duration 12 weeks   ? Treatment/Interventions Functional tasks;Cueing hierarchy;Environmental controls;Patient/family education;SLP instruction and feedback;Internal/external aids;Compensatory strategies;Compensatory techniques   ? Potential to Achieve Goals Good   ? Potential Considerations Medical prognosis   ? SLP Home Exercise Plan loud "ah"; oral reading, vocal glides   ? Consulted and Agree with Plan of Care Patient;Family member/caregiver   ? Family Member Consulted wife, [name]   ? ?  ?  ? ?  ? ? ?Patient will benefit from skilled therapeutic intervention in order to improve the following deficits and impairments:   ?Dysarthria ? ? ? ?Problem List ?Patient Active Problem List  ? Diagnosis Date Noted  ? Irregular heart rate 01/12/2022  ? OSA (obstructive sleep apnea) 01/12/2022  ? Parkinson's disease ([place]) 11/25/2020  ? Edema of right lower leg 11/25/2020  ? Hyperlipidemia 11/25/2020  ? 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 Hearing loss 11/25/2020  ? Pityriasis rosea 11/25/2020  ? ? ?Su Monks, CFSLP ?01/24/2022, 2:54 PM ? ?[facility] ?[address] ?[place], [place], [address] ?Phone: [phone]   Fax:  [phone] ? ? ?Name: KARVER FADDEN ?MRN: [id number] ?Date of Birth: Mar 07, 1946 ? ?

## 2022-01-24 NOTE — Patient Instructions (Addendum)
Complete your Speak Out lessons 2x a day, morning and afternoon. Same lesson both practice sessions. Can also practice using oral reading. Focus on breath support for those longer chunks for spoken speech.  ? ?Speak with intent!!  ?Be loud, project that voice! ? ? ?

## 2022-01-24 NOTE — Therapy (Signed)
[place] ?[place] ?[place], [place], [address] ?Phone: [phone]   Fax:  [phone] ? ?Occupational Therapy Treatment ? ?Patient Details  ?Name: [name] ?MRN: [id number] ?Date of Birth: 1946/07/17 ?Referring Provider (OT): Dr. Rexene Alberts ? ? ?Encounter Date: 01/24/2022 ? ? OT End of Session - 01/24/22 1325   ? ? Visit Number 5   ? Number of Visits 25   ? Date for OT Re-Evaluation 03/31/22   ? Authorization Type [employer]   ? Authorization Time Period 60 days- POC written for 12 weeks anticiapte d/c following 6-8 weeks   ? Authorization - Visit Number 5   ? Authorization - Number of Visits 10   ? Progress Note Due on Visit 10   ? OT Start Time 5638   ? OT Stop Time 1400   ? OT Time Calculation (min) 38 min   ? Activity Tolerance Patient tolerated treatment well   ? Behavior During Therapy [facility] for tasks assessed/performed   ? ?  ?  ? ?  ? ? ?Past Medical History:  ?Diagnosis Date  ? High cholesterol   ? Parkinson disease ([place])   ? ? ?Past Surgical History:  ?Procedure Laterality Date  ? TONSILLECTOMY    ? ? ?There were no vitals filed for this visit. ? ? Subjective Assessment - 01/24/22 1325   ? ? Subjective  Denies pain   ? Pertinent History PD diganosed in 2019   cellulitis, lymphedema, low back pain, hyperlipidemia,   ? Patient Stated Goals mobility, maintain independence   ? Currently in Pain? No/denies   ? ?  ?  ? ?  ? ? ? ?Reviewed PWR! Moves in supine (basic 4--with step out and separate bridges for PWR! Step per HEP).  Pt returned demo with min v.c. for large amplitude, particularly for twist and pushing with feet. ? ?To decr risk of future complications:  Practiced grasp/release of cylinder objects (bottles, cups) with use of PWR! Hands/large amplitude movements with min cueing.  Opening/closing bottles with use of large amplitude movement strategies with min cueing. ? ?Practiced writing with min cueing for strategies including slow down,  focus on spacing, forearm supported on table, trial of larger (tan foam) grip.  Pt wrote 3 sentences with no decr in size and good legibility (1 printed, 2 cursive).  Practiced continuous "l" with min cueing for large amplitude and spacing.   ? ?Placing grooved pegs in pegboard with each hand with min difficulty/cueing for PWR! Hands prior to grasp and cueing for posture (pt tends to lean to the R).  Recommended pt incr awareness/perform posture check at home during meals and when performing coordination HEP.   ? ? ? ? ? ? ? ? OT Short Term Goals - 01/06/22 0807   ? ?  ? OT SHORT TERM GOAL #1  ? Title I with PD specific HEP   ? Time 4   ? Period Weeks   ? Status New   ? Target Date 02/03/22   ?  ? OT SHORT TERM GOAL #2  ? Title Pt will verbalize understanding of adapted strategies to maximize safety and I with ADLs/ IADLs .   ? Time 4   ? Period Weeks   ? Status New   ?  ? OT SHORT TERM GOAL #3  ? Title Pt will demonstrate improved fine motor coordination for ADLs as evidenced by decreasing 9 hole peg test score for RUE by 3 secs   ?  Baseline R 38.25, L 33.75   ? Time 4   ? Period Weeks   ? Status New   ?  ? OT SHORT TERM GOAL #4  ? Title Pt will demonstrated improved LUE functional use as evidenced by increasing box/ blocks by 3 blocks   ? Baseline R 45, L 39   ? Time 4   ? Period Weeks   ? Status New   ?  ? OT SHORT TERM GOAL #5  ? Title Pt will verbalize understanding of ways to keep thinking skills sharp.   ? Time 4   ? Period Weeks   ? Status New   ? ?  ?  ? ?  ? ? ? ? OT Long Term Goals - 01/06/22 0809   ? ?  ? OT LONG TERM GOAL #1  ? Title Pt will verbalize understanding of ways to prevent future PD related complications and PD community resources.   ? Time 12   ? Period Weeks   ? Status New   ? Target Date 03/31/22   ?  ? OT LONG TERM GOAL #2  ? Title Pt will demonstrate improved ease with feeding as evidenced by decreasing PPT#2 (self feeding) by 3 secs   ? Baseline 14.19 secs   ? Time 12   ? Period Weeks    ? Status New   ?  ? OT LONG TERM GOAL #3  ? Title Pt will retrieve an item at 130 shoulder flexion and -20 elbow extension with RUE.   ? Baseline 125, -25   ? Time 12   ? Period Weeks   ? Status New   ?  ? OT LONG TERM GOAL #4  ? Title Pt will demonstrate ability to write a short paragraph with 100% legibility and no significant decrease in letter size.   ? Time 12   ? Period Weeks   ? Status New   ? ?  ?  ? ?  ? ? ? ? ? ? ? ? Plan - 01/24/22 1323   ? ? Clinical Impression Statement Pt is progressing towards goals. He demonstrates understanding of  big movements with ADLs, but will benefit from reinforcement.  Pt reponds well to cueing for large amplitude movements.   ? OT Occupational Profile and History Detailed Assessment- Review of Records and additional review of physical, cognitive, psychosocial history related to current functional performance   ? Occupational performance deficits (Please refer to evaluation for details): ADL's;IADL's;Leisure;Play;Social Participation   ? Body Structure / Function / Physical Skills ADL;Balance;Endurance;Mobility;Strength;Flexibility;UE functional use;FMC;Gait;Coordination;ROM;GMC;Decreased knowledge of precautions;Decreased knowledge of use of DME;IADL;Dexterity   ? Rehab Potential Good   ? Clinical Decision Making Limited treatment options, no task modification necessary   ? Comorbidities Affecting Occupational Performance: May have comorbidities impacting occupational performance   ? Modification or Assistance to Complete Evaluation  No modification of tasks or assist necessary to complete eval   ? OT Frequency 2x / week   plus eval, POC written for 12 weeks anticipate d/c after 6-8 weeks  ? OT Duration 12 weeks   ? OT Treatment/Interventions Self-care/ADL training;Therapeutic exercise;Functional Mobility Training;Balance training;Aquatic Therapy;Ultrasound;Neuromuscular education;Splinting;Manual Therapy;Therapeutic activities;DME and/or AE instruction;Cognitive  remediation/compensation;Gait Training;Moist Heat;Fluidtherapy;Paraffin;Cryotherapy;Passive range of motion;Patient/family education   ? Plan address posture, functional reaching,   ? Consulted and Agree with Plan of Care Patient   ? ?  ?  ? ?  ? ? ?Patient will benefit from skilled therapeutic intervention in order to improve the following  deficits and impairments:   ?Body Structure / Function / Physical Skills: ADL, Balance, Endurance, Mobility, Strength, Flexibility, UE functional use, FMC, Gait, Coordination, ROM, GMC, Decreased knowledge of precautions, Decreased knowledge of use of DME, IADL, Dexterity ?  ?  ? ? ?Visit Diagnosis: ?Other symptoms and signs involving the nervous system ? ?Other lack of coordination ? ?Abnormal posture ? ?Unsteadiness on feet ? ? ? ?Problem List ?Patient Active Problem List  ? Diagnosis Date Noted  ? Irregular heart rate 01/12/2022  ? OSA (obstructive sleep apnea) 01/12/2022  ? Parkinson's disease ([place]) 11/25/2020  ? Edema of right lower leg 11/25/2020  ? Hyperlipidemia 11/25/2020  ? 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 Hearing loss 11/25/2020  ? Pityriasis rosea 11/25/2020  ? ? Vianne Bulls, [place] ?01/24/2022, 2:14 PM ? ?[place] ?[place] ?[place], [place], [address] ?Phone: [phone]   Fax:  [phone] ? ?Name: [name] ?MRN: [id number] ?Date of Birth: 18-Nov-1945 ? ?Vianne Bulls, OTR/L ?[place] ?[place], [place]  [address] ?[phone] phone ?[phone] ?01/24/22 2:21 PM ? ? ? ?

## 2022-01-26 ENCOUNTER — Encounter: Payer: Self-pay | Admitting: Occupational Therapy

## 2022-01-26 ENCOUNTER — Ambulatory Visit: Payer: Medicare HMO | Admitting: Speech Pathology

## 2022-01-26 ENCOUNTER — Ambulatory Visit: Payer: Medicare HMO | Admitting: Physical Therapy

## 2022-01-26 ENCOUNTER — Other Ambulatory Visit: Payer: Self-pay

## 2022-01-26 ENCOUNTER — Ambulatory Visit: Payer: Medicare HMO | Admitting: Occupational Therapy

## 2022-01-26 DIAGNOSIS — R471 Dysarthria and anarthria: Secondary | ICD-10-CM

## 2022-01-26 DIAGNOSIS — R2689 Other abnormalities of gait and mobility: Secondary | ICD-10-CM | POA: Diagnosis not present

## 2022-01-26 DIAGNOSIS — R293 Abnormal posture: Secondary | ICD-10-CM | POA: Diagnosis not present

## 2022-01-26 DIAGNOSIS — R2681 Unsteadiness on feet: Secondary | ICD-10-CM | POA: Diagnosis not present

## 2022-01-26 DIAGNOSIS — R278 Other lack of coordination: Secondary | ICD-10-CM | POA: Diagnosis not present

## 2022-01-26 DIAGNOSIS — R29818 Other symptoms and signs involving the nervous system: Secondary | ICD-10-CM | POA: Diagnosis not present

## 2022-01-26 NOTE — Therapy (Signed)
[place] ?[place] ?[place], [place], [address] ?Phone: [phone]   Fax:  [phone] ? ?Speech Language Pathology Treatment ? ?Patient Details  ?Name: [name] ?MRN: [id number] ?Date of Birth: 1946-08-30 ?Referring Provider (SLP): Dr. Rexene Alberts ? ? ?Encounter Date: 01/26/2022 ? ? End of Session - 01/26/22 1221   ? ? Visit Number 7   ? Number of Visits 25   ? Date for SLP Re-Evaluation 04/07/22   ? SLP Start Time 1230   ? SLP Stop Time  1315   ? SLP Time Calculation (min) 45 min   ? Activity Tolerance Patient tolerated treatment well   ? ?  ?  ? ?  ? ? ?Past Medical History:  ?Diagnosis Date  ? High cholesterol   ? Parkinson disease ([place])   ? ? ?Past Surgical History:  ?Procedure Laterality Date  ? TONSILLECTOMY    ? ? ?There were no vitals filed for this visit. ? ? Subjective Assessment - 01/26/22 1223   ? ? Subjective "I did pull out my flute and try that for a few minutes." (to work on increasing breath support)   ? Currently in Pain? No/denies   ? ?  ?  ? ?  ? ? ? ? ? ? ? ? ADULT SLP TREATMENT - 01/26/22 0001   ? ?  ? General Information  ? Behavior/Cognition Alert;Cooperative;Pleasant mood   ?  ? Treatment Provided  ? Treatment provided Cognitive-Linquistic   ?  ? Cognitive-Linquistic Treatment  ? 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 Skilled Treatment Target improving vocal quality and increasing intensity through progressively difficulty speech tasks using Speak Out! program, lesson 5. Pt able to hit target dB for all speech tasks with rare min A. Averages this date: loud "ah" 87 dB; reading 76 dB; cognitive speech task 74 dB. Pt with increasing awareness of using appropriate breath support- x3 instances of self-correction. Self-awareness increasing re: using intent with conversational speech though still needing mod A to raise vocal intensity with spontenous speech.   ?  ? Assessment / Recommendations / Plan  ? Plan Continue with  current plan of care   ?  ? Progression Toward Goals  ? Progression toward goals Progressing toward goals   ? ?  ?  ? ?  ? ? ? SLP Education - 01/26/22 1220   ? ? Education Details volume adjustments, throat clearing cessation, HEP   ? Person(s) Educated Patient   ? Methods Explanation;Demonstration;Handout   ? Comprehension Verbalized understanding;Returned demonstration;Need further instruction   ? ?  ?  ? ?  ? ? ? SLP Short Term Goals - 01/26/22 1222   ? ?  ? SLP SHORT TERM GOAL #1  ? Title pt will produce loud "ah" averaging 90 dB over 5 trials with rare min A across 2 sessions   ? Baseline 01/24/22   ? Time 1   ? Period Weeks   ? Status On-going   ?  ? SLP SHORT TERM GOAL #2  ? Title pt will average 70-72 dB across 5 minute conversational sample   ? Time 1   ? Period Weeks   ? Status On-going   ?  ? SLP SHORT TERM GOAL #3  ? Title pt with average 70-72 dB at sentence level 18/20 sentences over 2 sessions   ? Baseline 01/24/22   ? Time 1   ? Period Weeks   ? Status On-going   ?  ?  SLP SHORT TERM GOAL #4  ? Title pt will have 10 or less throat clears over 45 minute session   ? Baseline 01/24/22   ? Time 1   ? Period Weeks   ? Status On-going   ? ?  ?  ? ?  ? ? ? SLP Long Term Goals - 01/26/22 1222   ? ?  ? SLP LONG TERM GOAL #1  ? Title pt will average 90 dB with x5 loud "ah" mod I over 3 sessions   ? Time 9   ? Period Weeks   ? Status On-going   ?  ? SLP LONG TERM GOAL #2  ? Title pt will average 70-72 dB over 20 minute conversation with rare min A   ? Time 9   ? Period Weeks   ? Status On-going   ?  ? SLP LONG TERM GOAL #3  ? Title pt/spouse report 50% decrease in requests for repetition subjectively at home   ? Time 9   ? Period Weeks   ? Status On-going   ?  ? SLP LONG TERM GOAL #4  ? Title pt/spouse will report compliance with vocal hygiene recommendations over 1 week period   ? Time 9   ? Period Weeks   ? Status On-going   ? ?  ?  ? ?  ? ? ? Plan - 01/26/22 1221   ? ? Clinical Impression Statement Timmothy Baranowski  has Parkinson's and presents with reduced vocal intensity/volume and hoarseness during speech.  Pt demonstrating improvements in vocal intensity and vocal quality during structured speech tasks in therapy, requires mod A to sustain during spontanous productions. Pt's spontaneous speech averages in low 60s dB. 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   ? Speech Therapy Frequency 2x / week   ? Duration 12 weeks   ? Treatment/Interventions Functional tasks;Cueing hierarchy;Environmental controls;Patient/family education;SLP instruction and feedback;Internal/external aids;Compensatory strategies;Compensatory techniques   ? Potential to Achieve Goals Good   ? Potential Considerations Medical prognosis   ? SLP Home Exercise Plan loud "ah"; oral reading, vocal glides   ? Consulted and Agree with Plan of Care Patient;Family member/caregiver   ? Family Member Consulted wife, [name]   ? ?  ?  ? ?  ? ? ?Patient will benefit from skilled therapeutic intervention in order to improve the following deficits and impairments:   ?Dysarthria ? ? ? ?Problem List ?Patient Active Problem List  ? Diagnosis Date Noted  ? Irregular heart rate 01/12/2022  ? OSA (obstructive sleep apnea) 01/12/2022  ? Parkinson's disease ([place]) 11/25/2020  ? Edema of right lower leg 11/25/2020  ? Hyperlipidemia 11/25/2020  ? 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 Hearing loss 11/25/2020  ? Pityriasis rosea 11/25/2020  ? ? ?Su Monks, CF-SLP ?01/26/2022, 2:06 PM ? ?[place] ?[place] ?[place], [place], [address] ?Phone: [phone]   Fax:  [phone] ? ? ?Name: TARICK PARENTEAU ?MRN: [id number] ?Date of Birth: 1946/06/26 ? ?

## 2022-01-26 NOTE — Therapy (Signed)
?OUTPATIENT PHYSICAL THERAPY TREATMENT NOTE ? ? ?Patient Name: [name] ?MRN: [id number] ?DOB:05/22/1946, 76 y.o., male ?Today's Date: 01/26/2022 ? ?PCP: Lesleigh Noe, MD ?REFERRING PROVIDER: Star Age, MD  ? ? PT End of Session - 01/26/22 1404   ? ? Visit Number 7   ? Number of Visits 17   Plus eval  ? Date for PT Re-Evaluation 03/30/22   ? Authorization Type [employer]   ? Authorization Time Period 01/05/22 - 03/30/22   ? PT Start Time 1400   ? PT Stop Time 0093   ? PT Time Calculation (min) 45 min   ? Equipment Utilized During Treatment Gait belt   ? Activity Tolerance Patient tolerated treatment well   ? Behavior During Therapy [facility] for tasks assessed/performed   ? ?  ?  ? ?  ? ? ? ? ? ? ?Past Medical History:  ?Diagnosis Date  ? High cholesterol   ? Parkinson disease ([place])   ? ?Past Surgical History:  ?Procedure Laterality Date  ? TONSILLECTOMY    ? ?Patient Active Problem List  ? Diagnosis Date Noted  ? Irregular heart rate 01/12/2022  ? OSA (obstructive sleep apnea) 01/12/2022  ? Parkinson's disease ([place]) 11/25/2020  ? Edema of right lower leg 11/25/2020  ? Hyperlipidemia 11/25/2020  ? 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 Hearing loss 11/25/2020  ? Pityriasis rosea 11/25/2020  ? ? ?REFERRING DIAG: G20 (ICD-10-CM) - Parkinson's disease ([place])  ? ?THERAPY DIAG:  ?Other abnormalities of gait and mobility ? ?Unsteadiness on feet ? ?Abnormal posture ? ?PERTINENT HISTORY: HLD, cellulitis, lymphedema, low back pain and hearing loss. ? ?PRECAUTIONS: Fall ? ?SUBJECTIVE: Pt reports frustration with weather, has not been able to walk outside w/RW with green theraband added to it yet. Plans on trying this weekend but reports walking in his house has gone well. No new changes.  ? ?PAIN:  ?Are you having pain? No ? ? ?TODAY'S TREATMENT:  ?Ther Act ?Updated HEP to include high amplitude movements, thoracic rotation and lateral weight shifting. Lengthy discussion regarding safe set up at home and pt able to  teach back.  ? ?NMR  ?Demonstrated new HEP on red floor mat w/S* from therapist (see exercises below). Pt demonstrated limited thoracic ROM w/thread the needles and is able to abduct arm to 90 degrees at top of movement. Min verbal cues to maintain gaze on lifted hand to promote further rotation. Pt has greater difficulty bringing L foot forward on tall kneel to half kneel due to poor ability to un-weight L foot. However pt improved weight shift with repetition.  ? ? ?PATIENT EDUCATION: ?Education details: Walking w/RW at home w/green theraband, safe floor transfer set-up, completion of new HEP ?Person educated: Patient ?Education method: Explanation and handout  ?Education comprehension: verbalized understanding ? ? ?HOME EXERCISE PROGRAM: ?Access Code: [email or web address] ?URL: [email or web address] ?Date: 01/26/2022 ?Prepared by: Mickie Bail Liliahna Cudd ? ?Exercises ?Wall push up with strong push away - 1 x daily - 7 x weekly - 3 sets - 10 reps ?Quadruped Thoracic Rotation - Reach Under - 1 x daily - 7 x weekly - 3 sets - 10 reps ?Half-Kneeling Forward Lean - 1 x daily - 7 x weekly - 3 sets - 10 reps ?Weighted Ab Twist - 1 x daily - 7 x weekly - 3 sets - 10 reps ?Quadruped rock back - 1 x daily - 7 x weekly - 3 sets - 10 reps ? ? ? ? PT Short Term Goals -  01/06/22 0806   ? ?  ? PT SHORT TERM GOAL #1  ? Title Pt will be independent with initial HEP with wife's supervision in order to build upon functional gains made in therapy. ALL STGS DUE 02/03/22   ? Time 4   ? Period Weeks   ? Status New   ? Target Date 02/03/22   ?  ? PT SHORT TERM GOAL #2  ? Title Pt will maintain gait speed of 2.8 ft/sec with LRAD with supervision in order to demo improved community ambulation.   ? Baseline 2.77 ft/sec with no AD - min guard.   ? Time 4   ? Period Weeks   ? Status New   ?  ? PT SHORT TERM GOAL #3  ? Title Pt will improve 5x sit <> stand with no UE support to 19 seconds or less in order to demo improved functional strength/decr  fall risk.   ? Baseline 22.97 seconds   ? Time 4   ? Period Weeks   ? Status New   ?  ? PT SHORT TERM GOAL #4  ? Title Pt will ambulate at least 230' over level indoor surfaces with LRAD with supervision for improved safety with mobility.   ? Baseline no AD- needs min guard and min A for balance at times.   ? Time 4   ? Period Weeks   ? Status New   ?  ? PT SHORT TERM GOAL #5  ? Title Pt and pt's wife will verbalize understanding of fall prevention in the home.   ? Time 4   ? Period Weeks   ? Status New   ? ?  ?  ? ?  ? ? ? PT Long Term Goals - 01/06/22 0818   ? ?  ? PT LONG TERM GOAL #1  ? Title Pt will be independent with final HEP with wife's supervision in order to build upon functional gains made in therapy. ALL LTGS DUE 03/03/22   ? Time 8   ? Period Weeks   ? Status New   ? Target Date 03/03/22   ?  ? PT LONG TERM GOAL #2  ? Title Pt will improve miniBEST to at least a 18/28 in order to demo decr fall risk.   ? Baseline 12/28   ? Time 8   ? Period Weeks   ? Status New   ?  ? PT LONG TERM GOAL #3  ? Title Pt will improve 5x sit <> stand with no UE support to 15 seconds or less in order to demo improved functional strength/decr fall risk.   ? Baseline 22.97 seconds   ? Time 8   ? Period Weeks   ? Status New   ?  ? PT LONG TERM GOAL #4  ? Title Pt will ambulate at least 400' over outdoor paved surfaces with LRAD with mod I in order to demo improved community mobility.   ? Time 8   ? Period Weeks   ? Status New   ?  ? PT LONG TERM GOAL #5  ? Title Pt will verbalize understanding of local Parkinson's disease resources,   ? Time 8   ? Period Weeks   ? Status New   ?  ? Additional Long Term Goals  ? Additional Long Term Goals Yes   ?  ? PT LONG TERM GOAL #6  ? Title Pt will recover posterior and anterior balance in push and release test in  2 steps independently, for improved balance recovery   ? Time 8   ? Period Weeks   ? Status New   ? ?  ?  ? ?  ? ? ? Plan - 01/26/22 1458   ? ? Clinical Impression Statement  Emphasis of skilled PT session on updating HEP for improved thoracic rotation, high amplitude movement and lateral weight shifting. Pt able to perform floor transfer safely to perform movements at home. Pt's gait pattern has improved w/addition of green band to RW, as pt is taking larger and symmetrical steps without verbal cues. Continue POC.   ? Personal Factors and Comorbidities Age;Fitness;Past/Current Experience;Comorbidity 1;Time since onset of injury/illness/exacerbation   ? Comorbidities Cellulitis   ? Examination-Activity Limitations Bend;Carry;Reach Overhead;Locomotion Level;Lift;Stairs;Squat;Stand;Transfers   ? Examination-Participation Restrictions Community Activity   ? Stability/Clinical Decision Making Evolving/Moderate complexity   ? Rehab Potential Good   ? PT Frequency 2x / week   ? PT Duration 12 weeks   ? PT Treatment/Interventions ADLs/Self Care Home Management;Gait training;DME Instruction;Stair training;Functional mobility training;Therapeutic activities;Therapeutic exercise;Balance training;Neuromuscular re-education;Cognitive remediation;Patient/family education;Energy conservation;Vestibular   ? PT Next Visit Plan How is new HEP? TM training, boxing progression, DL progression, rebounder stepping, clock drill, boom whackers, walk outside   ? PT Home Exercise Plan [email or web address]   ? Consulted and Agree with Plan of Care Patient;Family member/caregiver   ? Family Member Consulted Wife, [name]   ? ?  ?  ? ?  ? ? ? ? ? ? ? ? ?Cruzita Lederer Christo Hain, PT, DPT ?01/26/2022, 2:59 PM ? ?   ?

## 2022-01-26 NOTE — Therapy (Signed)
[place] ?[place] ?[place], [place], [address] ?Phone: [phone]   Fax:  [phone] ? ?Occupational Therapy Treatment ? ?Patient Details  ?Name: [name] ?MRN: [id number] ?Date of Birth: November 12, 1946 ?Referring Provider (OT): Dr. Rexene Alberts ? ? ?Encounter Date: 01/26/2022 ? ? OT End of Session - 01/26/22 1306   ? ? Visit Number 6   ? Number of Visits 25   ? Date for OT Re-Evaluation 03/31/22   ? Authorization Type [employer]   ? Authorization Time Period 60 days- POC written for 12 weeks anticiapte d/c following 6-8 weeks   ? Authorization - Visit Number 6   ? Authorization - Number of Visits 10   ? Progress Note Due on Visit 10   ? OT Start Time 1317   used restroom during session  ? OT Stop Time 1400   ? OT Time Calculation (min) 43 min   ? Activity Tolerance Patient tolerated treatment well   ? Behavior During Therapy [facility] for tasks assessed/performed   ? ?  ?  ? ?  ? ? ?Past Medical History:  ?Diagnosis Date  ? High cholesterol   ? Parkinson disease ([place])   ? ? ?Past Surgical History:  ?Procedure Laterality Date  ? TONSILLECTOMY    ? ? ?There were no vitals filed for this visit. ? ? Subjective Assessment - 01/26/22 1306   ? ? Subjective  Denies pain   ? Pertinent History PD diganosed in 2019   cellulitis, lymphedema, low back pain, hyperlipidemia,   ? Patient Stated Goals mobility, maintain independence   ? Currently in Pain? No/denies   ? ?  ?  ? ?  ? ? ? ?Simulated ADLs with bag with focus/min cues for large amplitude movements:  Donning/doffing pull-over shirt, donning/doffing pants, pulling bag into hand for clothing adjustment/donning socks, drying back. ? ?Standing, functional step and reach laterally incorporating wt. Shifts and trunk rotation to flip cards with min cueing for large amplitude movements with UEs. ? ?PWR! Rock at counter to reach targets to each side with UEs with min cueing for large amplitude movements. ? ?Sliding cards off  table by using PWR! Hands with focus on finger ext and min cues for incr movement amplitude with each hand. ? ?Sitting, functional reaching with each UE to place small pegs in vertical pegboard overhead for incr coordination (min cueing for PWR! Hands prior to grasp) and with lateral and forward wt. Shifts.   ? ? ? ? ? ? ? ? ? OT Short Term Goals - 01/06/22 0807   ? ?  ? OT SHORT TERM GOAL #1  ? Title I with PD specific HEP   ? Time 4   ? Period Weeks   ? Status New   ? Target Date 02/03/22   ?  ? OT SHORT TERM GOAL #2  ? Title Pt will verbalize understanding of adapted strategies to maximize safety and I with ADLs/ IADLs .   ? Time 4   ? Period Weeks   ? Status New   ?  ? OT SHORT TERM GOAL #3  ? Title Pt will demonstrate improved fine motor coordination for ADLs as evidenced by decreasing 9 hole peg test score for RUE by 3 secs   ? Baseline R 38.25, L 33.75   ? Time 4   ? Period Weeks   ? Status New   ?  ? OT SHORT TERM GOAL #4  ? Title Pt will demonstrated  improved LUE functional use as evidenced by increasing box/ blocks by 3 blocks   ? Baseline R 45, L 39   ? Time 4   ? Period Weeks   ? Status New   ?  ? OT SHORT TERM GOAL #5  ? Title Pt will verbalize understanding of ways to keep thinking skills sharp.   ? Time 4   ? Period Weeks   ? Status New   ? ?  ?  ? ?  ? ? ? ? OT Long Term Goals - 01/06/22 0809   ? ?  ? OT LONG TERM GOAL #1  ? Title Pt will verbalize understanding of ways to prevent future PD related complications and PD community resources.   ? Time 12   ? Period Weeks   ? Status New   ? Target Date 03/31/22   ?  ? OT LONG TERM GOAL #2  ? Title Pt will demonstrate improved ease with feeding as evidenced by decreasing PPT#2 (self feeding) by 3 secs   ? Baseline 14.19 secs   ? Time 12   ? Period Weeks   ? Status New   ?  ? OT LONG TERM GOAL #3  ? Title Pt will retrieve an item at 130 shoulder flexion and -20 elbow extension with RUE.   ? Baseline 125, -25   ? Time 12   ? Period Weeks   ? Status New   ?   ? OT LONG TERM GOAL #4  ? Title Pt will demonstrate ability to write a short paragraph with 100% legibility and no significant decrease in letter size.   ? Time 12   ? Period Weeks   ? Status New   ? ?  ?  ? ?  ? ? ? ? ? ? ? ? Plan - 01/26/22 1307   ? ? Clinical Impression Statement Pt is progressing towards goals, but continues to need cueing for full R finger ext at times for grasp.   ? OT Occupational Profile and History Detailed Assessment- Review of Records and additional review of physical, cognitive, psychosocial history related to current functional performance   ? Occupational performance deficits (Please refer to evaluation for details): ADL's;IADL's;Leisure;Play;Social Participation   ? Body Structure / Function / Physical Skills ADL;Balance;Endurance;Mobility;Strength;Flexibility;UE functional use;FMC;Gait;Coordination;ROM;GMC;Decreased knowledge of precautions;Decreased knowledge of use of DME;IADL;Dexterity   ? Rehab Potential Good   ? Clinical Decision Making Limited treatment options, no task modification necessary   ? Comorbidities Affecting Occupational Performance: May have comorbidities impacting occupational performance   ? Modification or Assistance to Complete Evaluation  No modification of tasks or assist necessary to complete eval   ? OT Frequency 2x / week   plus eval, POC written for 12 weeks anticipate d/c after 6-8 weeks  ? OT Duration 12 weeks   ? OT Treatment/Interventions Self-care/ADL training;Therapeutic exercise;Functional Mobility Training;Balance training;Aquatic Therapy;Ultrasound;Neuromuscular education;Splinting;Manual Therapy;Therapeutic activities;DME and/or AE instruction;Cognitive remediation/compensation;Gait Training;Moist Heat;Fluidtherapy;Paraffin;Cryotherapy;Passive range of motion;Patient/family education   ? Plan address posture, functional reaching, coordination   ? Consulted and Agree with Plan of Care Patient   ? ?  ?  ? ?  ? ? ?Patient will benefit from skilled  therapeutic intervention in order to improve the following deficits and impairments:   ?Body Structure / Function / Physical Skills: ADL, Balance, Endurance, Mobility, Strength, Flexibility, UE functional use, FMC, Gait, Coordination, ROM, GMC, Decreased knowledge of precautions, Decreased knowledge of use of DME, IADL, Dexterity ?  ?  ? ? ?Visit  Diagnosis: ?Other symptoms and signs involving the nervous system ? ?Other lack of coordination ? ?Abnormal posture ? ?Unsteadiness on feet ? ?Other abnormalities of gait and mobility ? ? ? ?Problem List ?Patient Active Problem List  ? Diagnosis Date Noted  ? Irregular heart rate 01/12/2022  ? OSA (obstructive sleep apnea) 01/12/2022  ? Parkinson's disease ([place]) 11/25/2020  ? Edema of right lower leg 11/25/2020  ? Hyperlipidemia 11/25/2020  ? 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 Hearing loss 11/25/2020  ? Pityriasis rosea 11/25/2020  ? ? Vianne Bulls, [place] ?01/26/2022, 2:12 PM ? ?[place] [facility] ?[place], [place], [address] ?Phone: [phone]   Fax:  [phone] ? ?Name: [name] ?MRN: [id number] ?Date of Birth: 1946/06/26 ? ?Vianne Bulls, OTR/L ?[place] ?[place], [place]  [address] ?[phone] phone ?[phone] ?01/26/22 1:55 PM ? ?

## 2022-01-31 ENCOUNTER — Other Ambulatory Visit: Payer: Self-pay

## 2022-01-31 ENCOUNTER — Encounter: Payer: Self-pay | Admitting: Occupational Therapy

## 2022-01-31 ENCOUNTER — Ambulatory Visit: Payer: Medicare HMO | Admitting: Occupational Therapy

## 2022-01-31 ENCOUNTER — Ambulatory Visit: Payer: Medicare HMO | Admitting: Speech Pathology

## 2022-01-31 ENCOUNTER — Ambulatory Visit: Payer: Medicare HMO | Admitting: Physical Therapy

## 2022-01-31 DIAGNOSIS — R29818 Other symptoms and signs involving the nervous system: Secondary | ICD-10-CM

## 2022-01-31 DIAGNOSIS — R293 Abnormal posture: Secondary | ICD-10-CM

## 2022-01-31 DIAGNOSIS — R2681 Unsteadiness on feet: Secondary | ICD-10-CM

## 2022-01-31 DIAGNOSIS — R471 Dysarthria and anarthria: Secondary | ICD-10-CM

## 2022-01-31 DIAGNOSIS — R278 Other lack of coordination: Secondary | ICD-10-CM | POA: Diagnosis not present

## 2022-01-31 DIAGNOSIS — R2689 Other abnormalities of gait and mobility: Secondary | ICD-10-CM | POA: Diagnosis not present

## 2022-01-31 NOTE — Therapy (Signed)
?OUTPATIENT PHYSICAL THERAPY TREATMENT NOTE ? ? ?Patient Name: [name] ?MRN: [id number] ?DOB:1946/01/21, 76 y.o., male ?Today's Date: 01/31/2022 ? ?PCP: Lesleigh Noe, MD ?REFERRING PROVIDER: Star Age, MD  ? ? PT End of Session - 01/31/22 1411   ? ? Visit Number 8   ? Number of Visits 17   Plus eval  ? Date for PT Re-Evaluation 03/30/22   ? Authorization Type [employer]   ? Authorization Time Period 01/05/22 - 03/30/22   ? PT Start Time 1402   ? PT Stop Time 6195   ? PT Time Calculation (min) 44 min   ? Equipment Utilized During Treatment Gait belt   ? Activity Tolerance Patient tolerated treatment well   ? Behavior During Therapy [facility] for tasks assessed/performed   ? ?  ?  ? ?  ? ? ? ? ? ? ? ?Past Medical History:  ?Diagnosis Date  ? High cholesterol   ? Parkinson disease ([place])   ? ?Past Surgical History:  ?Procedure Laterality Date  ? TONSILLECTOMY    ? ?Patient Active Problem List  ? Diagnosis Date Noted  ? Irregular heart rate 01/12/2022  ? OSA (obstructive sleep apnea) 01/12/2022  ? Parkinson's disease ([place]) 11/25/2020  ? Edema of right lower leg 11/25/2020  ? Hyperlipidemia 11/25/2020  ? 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 Hearing loss 11/25/2020  ? Pityriasis rosea 11/25/2020  ? ? ?REFERRING DIAG: G20 (ICD-10-CM) - Parkinson's disease ([place])  ? ?THERAPY DIAG:  ?Unsteadiness on feet ? ?Abnormal posture ? ?Other abnormalities of gait and mobility ? ?PERTINENT HISTORY: HLD, cellulitis, lymphedema, low back pain and hearing loss. ? ?PRECAUTIONS: Fall ? ?SUBJECTIVE: "I just want the weather to be better so I can walk outside. I had to go to the place I hate most - [employer]". No new changes   ? ?PAIN:  ?Are you having pain? No ? ? ?TODAY'S TREATMENT:  ?Ther Ex ?SciFit level 7 for 6 minutes w/BLEs/BUEs for dynamic cardiovascular warmup and UE/LE coordination. Pt maintained steps/min >70 throughout  ? ?CURB:  ?Level of Assistance: SBA ?Assistive device utilized: Environmental consultant - 2 wheeled ?Curb Comments:  Provided visual demonstration of proper technique to ascend/descend curb w/RW and pt able to demonstrate w/SBA.  ? ?NMR  ?Updated HEP to include modified plank jacks against the wall for high velocity movement and improved lateral weightshifting. Noted pt unable to move RLE out w/jump, provided mod verbal cues to over-exaggerate movement and pt able to move RLE slightly, CGA throughout.  ? ? ? [facility] PT Assessment - 01/31/22 1604   ? ?  ? Transfers  ? Five time sit to stand comments  12.5s without UE support   ?  ? Ambulation/Gait  ? Gait velocity 9.8s without AD= 3.3 ft/s   ? ?  ?  ? ?  ? ? ? ?PATIENT EDUCATION: ?Education details: Updates to HEP, goal progression  ?Person educated: Patient ?Education method: Explanation and handout  ?Education comprehension: verbalized understanding ? ? ?HOME EXERCISE PROGRAM: ?Access Code: [email or web address] ?URL: [email or web address] ?Date: 01/26/2022 ?Prepared by: Mickie Bail [place] ? ?Exercises ?Wall push up with strong push away - 1 x daily - 7 x weekly - 3 sets - 10 reps ?Quadruped Thoracic Rotation - Reach Under - 1 x daily - 7 x weekly - 3 sets - 10 reps ?Half-Kneeling Forward Lean - 1 x daily - 7 x weekly - 3 sets - 10 reps ?Weighted Ab Twist - 1 x daily - 7 x weekly -  3 sets - 10 reps ?Quadruped rock back - 1 x daily - 7 x weekly - 3 sets - 10 reps ?Modified plank jacks with wall - 1 x daily - 7 x weekly - 3 sets - 10 reps ? ? ? ? PT Short Term Goals - 01/06/22 0806   ? ?  ? PT SHORT TERM GOAL #1  ? Title Pt will be independent with initial HEP with wife's supervision in order to build upon functional gains made in therapy. ALL STGS DUE 02/03/22   ? Time 4   ? Period Weeks   ? Status Achieved   ? Target Date 02/03/22   ?  ? PT SHORT TERM GOAL #2  ? Title Pt will maintain gait speed of 2.8 ft/sec with LRAD with supervision in order to demo improved community ambulation.   ? Baseline 2.77 ft/sec with no AD - min guard; 3.3 ft/s without RW and 3.23 ft/s with AD  ? Time 4   ?  Period Weeks   ? Status Achieved   ?  ? PT SHORT TERM GOAL #3  ? Title Pt will improve 5x sit <> stand with no UE support to 19 seconds or less in order to demo improved functional strength/decr fall risk.   ? Baseline 22.97 seconds, 12.5s on 3/21    ? Time 4   ? Period Weeks   ? Status Achieved   ?  ? PT SHORT TERM GOAL #4  ? Title Pt will ambulate at least 230' over level indoor surfaces with LRAD with supervision for improved safety with mobility.   ? Baseline no AD- needs min guard and min A for balance at times.   ? Time 4   ? Period Weeks   ? Status New   ?  ? PT SHORT TERM GOAL #5  ? Title Pt and pt's wife will verbalize understanding of fall prevention in the home.   ? Time 4   ? Period Weeks   ? Status New   ? ?  ?  ? ?  ? ? ? PT Long Term Goals - 01/06/22 0818   ? ?  ? PT LONG TERM GOAL #1  ? Title Pt will be independent with final HEP with wife's supervision in order to build upon functional gains made in therapy. ALL LTGS DUE 03/03/22   ? Time 8   ? Period Weeks   ? Status New   ? Target Date 03/03/22   ?  ? PT LONG TERM GOAL #2  ? Title Pt will improve miniBEST to at least a 18/28 in order to demo decr fall risk.   ? Baseline 12/28   ? Time 8   ? Period Weeks   ? Status New   ?  ? PT LONG TERM GOAL #3  ? Title Pt will improve 5x sit <> stand with no UE support to 15 seconds or less in order to demo improved functional strength/decr fall risk.   ? Baseline 22.97 seconds   ? Time 8   ? Period Weeks   ? Status New   ?  ? PT LONG TERM GOAL #4  ? Title Pt will ambulate at least 400' over outdoor paved surfaces with LRAD with mod I in order to demo improved community mobility.   ? Time 8   ? Period Weeks   ? Status New   ?  ? PT LONG TERM GOAL #5  ? Title Pt will verbalize understanding  of local Parkinson's disease resources,   ? Time 8   ? Period Weeks   ? Status New   ?  ? Additional Long Term Goals  ? Additional Long Term Goals Yes   ?  ? PT LONG TERM GOAL #6  ? Title Pt will recover posterior and anterior  balance in push and release test in 2 steps independently, for improved balance recovery   ? Time 8   ? Period Weeks   ? Status New   ? ?  ?  ? ?  ? ? ? Plan - 01/26/22 1458   ? ? Clinical Impression Statement Emphasis of skilled PT session on updating HEP to include plyometrics and beginning to assess STGs. Pt has achieved 3 of 5 STGs, improving his gait velocity to 3.3 ft/s without AD and 5x STS from 23s to 12.5s without UE support, indicative of improved BLE strength, balance and decreased fall risk. Pt has been independent with performing HEP at home and is able to teach back safe set up of exercises in home environment. Continue POC.   ? Personal Factors and Comorbidities Age;Fitness;Past/Current Experience;Comorbidity 1;Time since onset of injury/illness/exacerbation   ? Comorbidities Cellulitis   ? Examination-Activity Limitations Bend;Carry;Reach Overhead;Locomotion Level;Lift;Stairs;Squat;Stand;Transfers   ? Examination-Participation Restrictions Community Activity   ? Stability/Clinical Decision Making Evolving/Moderate complexity   ? Rehab Potential Good   ? PT Frequency 2x / week   ? PT Duration 12 weeks   ? PT Treatment/Interventions ADLs/Self Care Home Management;Gait training;DME Instruction;Stair training;Functional mobility training;Therapeutic activities;Therapeutic exercise;Balance training;Neuromuscular re-education;Cognitive remediation;Patient/family education;Energy conservation;Vestibular   ? PT Next Visit Plan How is new HEP? TM training, boxing progression, DL progression, rebounder stepping, clock drill, boom whackers, walk outside, plyometrics (squat/star jumps), alt lunges w/slam ball  ? PT Home Exercise Plan [email or web address]   ? Consulted and Agree with Plan of Care Patient;Family member/caregiver   ? Family Member Consulted Wife, [name]   ? ?  ?  ? ?  ? ? ? ? ? ?Cruzita Lederer Tamir Wallman, PT, DPT ?01/31/2022, 4:00 PM ? ?   ?

## 2022-01-31 NOTE — Therapy (Signed)
[place] ?[place] ?[place], [place], [address] ?Phone: [phone]   Fax:  [phone] ? ?Occupational Therapy Treatment ? ?Patient Details  ?Name: [name] ?MRN: [id number] ?Date of Birth: [birth year] ?Referring Provider (OT): Dr. Rexene Alberts ? ? ?Encounter Date: 01/31/2022 ? ? OT End of Session - 01/31/22 1248   ? ? Visit Number 7   ? Number of Visits 25   ? Date for OT Re-Evaluation 03/31/22   ? Authorization Type [employer]   ? Authorization Time Period 60 days- POC written for 12 weeks anticiapte d/c following 6-8 weeks   ? Authorization - Visit Number 7   ? Authorization - Number of Visits 10   ? Progress Note Due on Visit 10   ? OT Start Time 1317   ? OT Stop Time 1400   ? OT Time Calculation (min) 43 min   ? Activity Tolerance Patient tolerated treatment well   ? Behavior During Therapy [facility] for tasks assessed/performed   ? ?  ?  ? ?  ? ? ?Past Medical History:  ?Diagnosis Date  ? High cholesterol   ? Parkinson disease ([place])   ? ? ?Past Surgical History:  ?Procedure Laterality Date  ? TONSILLECTOMY    ? ? ?There were no vitals filed for this visit. ? ? Subjective Assessment - 01/31/22 1248   ? ? Subjective  Denies pain   ? Pertinent History PD diganosed in 2019   cellulitis, lymphedema, low back pain, hyperlipidemia,   ? Patient Stated Goals mobility, maintain independence   ? Currently in Pain? No/denies   ? ?  ?  ? ?  ? ? ? ?Activities for bilateral hand coordination (performed with both hands simultaneously) with min cueing for timing and large amplitude movement strategies:  Stacking/unstacking 1-inch blocks (10 block towers), flipping cards, dealing cards with thumb.  ? ?PWR! Hands to pick up coins with each finger/thumb for incr individual finger movements with each hand with min difficulty. ? ?Sitting, performing shoulder flex with ball (floor>overhead with each UE) and then diagonals to each side with trunk rotation/wt. Shift with min v.c.  for incr movement amplitude and posture.   ? ?Standing, functional step and reach forward/back incorporating wt. Shifts and trunk rotation with min cueing for large amplitude, step size and posture.   ? ? ? ? ? ? ? OT Short Term Goals - 01/31/22 1327   ? ?  ? OT SHORT TERM GOAL #1  ? Title I with PD specific HEP   ? Time 4   ? Period Weeks   ? Status New   ? Target Date 02/03/22   ?  ? OT SHORT TERM GOAL #2  ? Title Pt will verbalize understanding of adapted strategies to maximize safety and I with ADLs/ IADLs .   ? Time 4   ? Period Weeks   ? Status New   ?  ? OT SHORT TERM GOAL #3  ? Title Pt will demonstrate improved fine motor coordination for ADLs as evidenced by decreasing 9 hole peg test score for RUE by 3 secs   ? Baseline R 38.25, L 33.75   ? Time 4   ? Period Weeks   ? Status Achieved   01/31/22:  R-27.62sec, L-29.53sec  ?  ? OT SHORT TERM GOAL #4  ? Title Pt will demonstrated improved LUE functional use as evidenced by increasing box/ blocks by 3 blocks   ? Baseline R 45, L 39   ?  Time 4   ? Period Weeks   ? Status Achieved   01/31/22:  R-46 blocks, L-43 blocks  ?  ? OT SHORT TERM GOAL #5  ? Title Pt will verbalize understanding of ways to keep thinking skills sharp.   ? Time 4   ? Period Weeks   ? Status New   ? ?  ?  ? ?  ? ? ? ? OT Long Term Goals - 01/06/22 0809   ? ?  ? OT LONG TERM GOAL #1  ? Title Pt will verbalize understanding of ways to prevent future PD related complications and PD community resources.   ? Time 12   ? Period Weeks   ? Status New   ? Target Date 03/31/22   ?  ? OT LONG TERM GOAL #2  ? Title Pt will demonstrate improved ease with feeding as evidenced by decreasing PPT#2 (self feeding) by 3 secs   ? Baseline 14.19 secs   ? Time 12   ? Period Weeks   ? Status New   ?  ? OT LONG TERM GOAL #3  ? Title Pt will retrieve an item at 130 shoulder flexion and -20 elbow extension with RUE.   ? Baseline 125, -25   ? Time 12   ? Period Weeks   ? Status New   ?  ? OT LONG TERM GOAL #4  ? Title  Pt will demonstrate ability to write a short paragraph with 100% legibility and no significant decrease in letter size.   ? Time 12   ? Period Weeks   ? Status New   ? ?  ?  ? ?  ? ? ? ? ? ? ? ? Plan - 01/31/22 1249   ? ? Clinical Impression Statement Pt is progressing towards goals, and demo improving bilateral hand coordination.   STGs #3 and 4 met.   ? OT Occupational Profile and History Detailed Assessment- Review of Records and additional review of physical, cognitive, psychosocial history related to current functional performance   ? Occupational performance deficits (Please refer to evaluation for details): ADL's;IADL's;Leisure;Play;Social Participation   ? Body Structure / Function / Physical Skills ADL;Balance;Endurance;Mobility;Strength;Flexibility;UE functional use;FMC;Gait;Coordination;ROM;GMC;Decreased knowledge of precautions;Decreased knowledge of use of DME;IADL;Dexterity   ? Rehab Potential Good   ? Clinical Decision Making Limited treatment options, no task modification necessary   ? Comorbidities Affecting Occupational Performance: May have comorbidities impacting occupational performance   ? Modification or Assistance to Complete Evaluation  No modification of tasks or assist necessary to complete eval   ? OT Frequency 2x / week   plus eval, POC written for 12 weeks anticipate d/c after 6-8 weeks  ? OT Duration 12 weeks   ? OT Treatment/Interventions Self-care/ADL training;Therapeutic exercise;Functional Mobility Training;Balance training;Aquatic Therapy;Ultrasound;Neuromuscular education;Splinting;Manual Therapy;Therapeutic activities;DME and/or AE instruction;Cognitive remediation/compensation;Gait Training;Moist Heat;Fluidtherapy;Paraffin;Cryotherapy;Passive range of motion;Patient/family education   ? Plan finish checking STGs   ? Consulted and Agree with Plan of Care Patient   ? ?  ?  ? ?  ? ? ?Patient will benefit from skilled therapeutic intervention in order to improve the following  deficits and impairments:   ?Body Structure / Function / Physical Skills: ADL, Balance, Endurance, Mobility, Strength, Flexibility, UE functional use, FMC, Gait, Coordination, ROM, GMC, Decreased knowledge of precautions, Decreased knowledge of use of DME, IADL, Dexterity ?  ?  ? ? ?Visit Diagnosis: ?Other symptoms and signs involving the nervous system ? ?Other lack of coordination ? ?Abnormal posture ? ?Unsteadiness on  feet ? ? ? ?Problem List ?Patient Active Problem List  ? Diagnosis Date Noted  ? Irregular heart rate 01/12/2022  ? OSA (obstructive sleep apnea) 01/12/2022  ? Parkinson's disease ([place]) 11/25/2020  ? Edema of right lower leg 11/25/2020  ? Hyperlipidemia 11/25/2020  ? 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 Hearing loss 11/25/2020  ? Pityriasis rosea 11/25/2020  ? ? Vianne Bulls, [place] ?01/31/2022, 2:15 PM ? ?[place] ?[place] ?[place], [place], [address] ?Phone: [phone]   Fax:  [phone] ? ?Name: [name] ?MRN: [id number] ?Date of Birth: [birth year] ? ?Vianne Bulls, OTR/L ?[place] ?[place], [place]  [address] ?[phone] phone ?[phone] ?01/31/22 2:15 PM ? ? ? ?

## 2022-01-31 NOTE — Therapy (Signed)
[facility] ?[place] ?[place], [place], [address] ?Phone: [phone]   Fax:  [phone] ? ?Speech Language Pathology Treatment ? ?Patient Details  ?Name: [name] ?MRN: [id number] ?Date of Birth: 10-Mar-1946 ?Referring Provider (SLP): Dr. Rexene Alberts ? ? ?Encounter Date: 01/31/2022 ? ? End of Session - 01/31/22 1226   ? ? Visit Number 8   ? Number of Visits 25   ? Date for SLP Re-Evaluation 04/07/22   ? SLP Start Time 1230   ? SLP Stop Time  1314   ? SLP Time Calculation (min) 44 min   ? Activity Tolerance Patient tolerated treatment well   ? ?  ?  ? ?  ? ? ?Past Medical History:  ?Diagnosis Date  ? High cholesterol   ? Parkinson disease ([place])   ? ? ?Past Surgical History:  ?Procedure Laterality Date  ? TONSILLECTOMY    ? ? ?There were no vitals filed for this visit. ? ? Subjective Assessment - 01/31/22 1228   ? ? Subjective "I'm doing good."   ? Patient is accompained by: Interpreter   ? Currently in Pain? No/denies   ? ?  ?  ? ?  ? ? ? ? ? ? ? ? ADULT SLP TREATMENT - 01/31/22 1238   ? ?  ? General Information  ? Behavior/Cognition Alert;Cooperative;Pleasant mood   ?  ? Treatment Provided  ? Treatment provided Cognitive-Linquistic   ?  ? Cognitive-Linquistic Treatment  ? 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 Skilled Treatment Target improving vocal quality and increasing intensity through progressively difficulty speech tasks using Speak Out! program, lesson 8. Averages this date: loud "ah" 86 dB; reading75 dB; cognitive speech task 73 dB. Rare min A rrquired this date during structured practice. Continues to demonstrate increasing awareness of using appropriate breath. Usual mod A to raise vocal intensity with spontenous speech. Pt self cueing to "swallow" vs. throat clears x5 this date.   ?  ? Assessment / Recommendations / Plan  ? Plan Continue with current plan of care   ?  ? Progression Toward Goals  ? Progression toward goals Progressing  toward goals   ? ?  ?  ? ?  ? ? ? ? ? SLP Short Term Goals - 01/31/22 1227   ? ?  ? SLP SHORT TERM GOAL #1  ? Title pt will produce loud "ah" averaging 90 dB over 5 trials with rare min A across 2 sessions   ? Baseline 01/24/22; 01/31/22   ? Status Achieved   ?  ? SLP SHORT TERM GOAL #2  ? Title pt will average 70-72 dB across 5 minute conversational sample   ? Time 0   ? Period Weeks   ? Status On-going   ?  ? SLP SHORT TERM GOAL #3  ? Title pt with average 70-72 dB at sentence level 18/20 sentences over 2 sessions   ? Baseline 01/24/22; 01/31/22   ? Status Achieved   ?  ? SLP SHORT TERM GOAL #4  ? Title pt will have 10 or less throat clears over 45 minute session   ? Baseline 01/24/22   ? Time 0   ? Period Weeks   ? Status On-going   ? ?  ?  ? ?  ? ? ? SLP Long Term Goals - 01/31/22 1228   ? ?  ? SLP LONG TERM GOAL #1  ? Title pt will average 90 dB with x5 loud "ah"  mod I over 3 sessions   ? Time 8   ? Period Weeks   ? Status On-going   ?  ? SLP LONG TERM GOAL #2  ? Title pt will average 70-72 dB over 20 minute conversation with rare min A   ? Time 8   ? Period Weeks   ? Status On-going   ?  ? SLP LONG TERM GOAL #3  ? Title pt/spouse report 50% decrease in requests for repetition subjectively at home   ? Time 8   ? Period Weeks   ? Status On-going   ?  ? SLP LONG TERM GOAL #4  ? Title pt/spouse will report compliance with vocal hygiene recommendations over 1 week period   ? Time 8   ? Period Weeks   ? Status On-going   ? ?  ?  ? ?  ? ? ? Plan - 01/31/22 1226   ? ? Clinical Impression Statement [name] has Parkinson's and presents with reduced vocal intensity/volume and hoarseness during speech.  Pt demonstrating improvements in vocal intensity and vocal quality during structured speech tasks in therapy, requires mod A to sustain during spontanous productions. Pt's spontaneous speech averages in low 60s dB. 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   ? Speech Therapy Frequency 2x / week   ?  Duration 12 weeks   ? Treatment/Interventions Functional tasks;Cueing hierarchy;Environmental controls;Patient/family education;SLP instruction and feedback;Internal/external aids;Compensatory strategies;Compensatory techniques   ? Potential to Achieve Goals Good   ? Potential Considerations Medical prognosis   ? SLP Home Exercise Plan loud "ah"; oral reading, vocal glides   ? Consulted and Agree with Plan of Care Patient;Family member/caregiver   ? Family Member Consulted wife, [name]   ? ?  ?  ? ?  ? ? ?Patient will benefit from skilled therapeutic intervention in order to improve the following deficits and impairments:   ?Dysarthria ? ? ? ?Problem List ?Patient Active Problem List  ? Diagnosis Date Noted  ? Irregular heart rate 01/12/2022  ? OSA (obstructive sleep apnea) 01/12/2022  ? Parkinson's disease ([place]) 11/25/2020  ? Edema of right lower leg 11/25/2020  ? Hyperlipidemia 11/25/2020  ? 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 Hearing loss 11/25/2020  ? Pityriasis rosea 11/25/2020  ? ? ?Su Monks, CF-SLP ?01/31/2022, 1:17 PM ? ?[facility] ?[place] ?[place], [place], [address] ?Phone: [phone]   Fax:  [phone] ? ? ?Name: [name] ?MRN: [id number] ?Date of Birth: Sep 30, 1946 ? ?

## 2022-02-01 ENCOUNTER — Ambulatory Visit (INDEPENDENT_AMBULATORY_CARE_PROVIDER_SITE_OTHER)
Admission: RE | Admit: 2022-02-01 | Discharge: 2022-02-01 | Disposition: A | Discharge status: Home / Self Care | Payer: Self-pay | Source: Ambulatory Visit | Attending: Cardiology | Admitting: Cardiology

## 2022-02-01 ENCOUNTER — Ambulatory Visit (INDEPENDENT_AMBULATORY_CARE_PROVIDER_SITE_OTHER): Payer: Medicare HMO

## 2022-02-01 ENCOUNTER — Encounter: Payer: Self-pay | Admitting: Family Medicine

## 2022-02-01 ENCOUNTER — Ambulatory Visit: Payer: Medicare HMO | Admitting: Cardiology

## 2022-02-01 ENCOUNTER — Encounter: Payer: Self-pay | Admitting: Cardiology

## 2022-02-01 VITALS — BP 132/70 | HR 68 | Ht 74.0 in | Wt 243.0 lb

## 2022-02-01 DIAGNOSIS — Z8249 Family history of ischemic heart disease and other diseases of the circulatory system: Secondary | ICD-10-CM | POA: Diagnosis not present

## 2022-02-01 DIAGNOSIS — I499 Cardiac arrhythmia, unspecified: Secondary | ICD-10-CM

## 2022-02-01 DIAGNOSIS — I700 Atherosclerosis of aorta: Secondary | ICD-10-CM | POA: Insufficient documentation

## 2022-02-01 NOTE — Addendum Note (Signed)
Addended by: Antonieta Iba on: 02/01/2022 10:16 AM ? ? Modules accepted: Orders ? ?

## 2022-02-01 NOTE — Progress Notes (Signed)
? ?Cardiology CONSULT Note   ? ?Date:  02/01/2022  ? ?ID:  [name], DOB Jun 29, 1946, MRN [id number] ? ?PCP:  Lesleigh Noe, MD  ?Cardiologist:  Fransico Him, MD  ? ?Chief Complaint  ?Patient presents with  ? New Patient (Initial Visit)  ?  Irregular heartbeat during sleep study  ? ? ?History of Present Illness:  ?[name] is a 76 y.o. male who is being seen today for the evaluation of irregular heartbeat at the request of Lesleigh Noe, MD. ? ?This is a 76 year old male with a history of hyperlipidemia, Parkinson's disease and obstructive sleep apnea.  He recently underwent a sleep study by neurology and it was reported that he had an abnormal EKG during his sleep study.  His primary care physician was able to access his sleep report in the media tab which showed some documentation of PACs during the study but unable to actually see the rhythm strips.   ? ?He is now referred for further evaluation he denies any chest pain or shortness of breath, PND orthopnea, dizziness, palpitations or syncope.  He occasionally has some mild LE edema.  He drinks 2-3 cups of caffeine daily and 1 can of beer daily.  ? ?Past Medical History:  ?Diagnosis Date  ? High cholesterol   ? Parkinson disease ([place])   ? ? ?Past Surgical History:  ?Procedure Laterality Date  ? TONSILLECTOMY    ? ? ?Current Medications: ?Current Meds  ?Medication Sig  ? aspirin 81 MG chewable tablet Chew 81 mg by mouth daily.  ? carbidopa-levodopa (SINEMET IR) 25-100 MG tablet Take 1 tablet by mouth 3 (three) times daily.  ? cetirizine (ZYRTEC ALLERGY) 10 MG tablet Take 1 tablet (10 mg total) by mouth daily. (Patient taking differently: Take 10 mg by mouth as needed.)  ? desonide (DESOWEN) 0.05 % cream Apply topically 2 (two) times daily.  ? fenofibrate 160 MG tablet Take 1 tablet (160 mg total) by mouth daily.  ? furosemide (LASIX) 10 MG/ML solution Take 10 mg by mouth daily.  ? glucosamine-chondroitin 500-400 MG tablet Take 1 tablet by mouth daily.   ? Multiple Vitamins-Minerals (MULTIVITAMIN WITH MINERALS) tablet Take 1 tablet by mouth daily.  ? mupirocin ointment (BACTROBAN) 2 % Apply topically as needed.  ? pramipexole (MIRAPEX) 1 MG tablet Take 1 tablet (1 mg total) by mouth in the morning, at noon, and at bedtime.  ? ? ?Allergies:   Patient has no known allergies.  ? ?Social History  ? ?Socioeconomic History  ? Marital status: Married  ?  Spouse name: [name]  ? Number of children: 1  ? Years of education: master's degree  ? Highest education level: Not on file  ?Occupational History  ? Not on file  ?Tobacco Use  ? Smoking status: Former  ?  Types: Pipe  ? Smokeless tobacco: Never  ?Vaping Use  ? Vaping Use: Never used  ?Substance and Sexual Activity  ? Alcohol use: Yes  ?  Alcohol/week: 7.0 standard drinks  ?  Types: 7 Standard drinks or equivalent per week  ?  Comment: 1 per day  ? Drug use: Never  ? Sexual activity: Yes  ?  Birth control/protection: Post-menopausal  ?Other Topics Concern  ? Not on file  ?Social History Narrative  ? 11/25/20  ? From: [place], but from this area, moved back 2022  ? Living: with wife, [name] [phone])  ? Work: retired Librarian, academic, played piano  ?   ?  Family: Cristen in [place], 2 granddaughters   ?   ? Enjoys: piano, read, crosswords  ?   ? Exercise: planning to go to the [facility]  ? Diet: good, yogurt, cereal, big meal at lunchtime, light dinner  ?   ? Safety  ? Seat belts: Yes   ? Guns: no  ? Safe in relationships: Yes   ? ?Social Determinants of Health  ? ?Financial Resource Strain: Not on file  ?Food Insecurity: Not on file  ?Transportation Needs: Not on file  ?Physical Activity: Not on file  ?Stress: Not on file  ?Social Connections: Not on file  ?  ? ?Family History:  The patient's family history includes Arthritis in his maternal grandmother and mother; Diabetes in his daughter; Hearing loss in his father and mother; Heart attack in his father; Heart disease in his maternal  grandmother; Hyperlipidemia in his father; Hypertension in his maternal grandmother, mother, and sister; Prostate cancer in his father.  ? ?ROS:   ?Please see the history of present illness.    ?ROS All other systems reviewed and are negative. ? ?   ? View : No data to display.  ?  ?  ?  ? ? ? ? ? ?PHYSICAL EXAM:   ?VS:  BP 132/70   Pulse 68   Ht '6\' 2"'$  (1.88 m)   Wt 243 lb (110.2 kg)   SpO2 92%   BMI 31.20 kg/m?    ?GEN: Well nourished, well developed, in no acute distress  ?HEENT: normal  ?Neck: no JVD, carotid bruits, or masses ?Cardiac: RRR; no murmurs, rubs, or gallops,no edema.  Intact distal pulses bilaterally.  ?Respiratory:  clear to auscultation bilaterally, normal work of breathing ?GI: soft, nontender, nondistended, + BS ?MS: no deformity or atrophy  ?Skin: warm and dry, no rash ?Neuro:  Alert and Oriented x 3, Strength and sensation are intact ?Psych: euthymic mood, full affect ? ?Wt Readings from Last 3 Encounters:  ?02/01/22 243 lb (110.2 kg)  ?01/12/22 242 lb 4 oz (109.9 kg)  ?12/29/21 248 lb (112.5 kg)  ?  ? ? ?Studies/Labs Reviewed:  ? ?EKG:  EKG is not ordered today.   ? ?Recent Labs: ?No results found for requested labs within last 8760 hours.  ? ?Lipid Panel ?   ?Component Value Date/Time  ? CHOL 143 11/25/2020 1046  ? TRIG 85.0 11/25/2020 1046  ? HDL 51.70 11/25/2020 1046  ? CHOLHDL 3 11/25/2020 1046  ? VLDL 17.0 11/25/2020 1046  ? [place] 74 11/25/2020 1046  ? ?Additional studies/ records that were reviewed today include:  ?Office visit notes from PCP ? ? ? ?ASSESSMENT:   ? ?1. Irregular heartbeat   ?2. Family history of early CAD   ? ? ? ?PLAN:  ?In order of problems listed above: ? ?Irregular heartbeat ?-EKG fro mPCP office was normal ?-he is completely asymptomatic with no palpitations ?-There are no EKG strips to read from his recent sleep study but it was reported in the sleep report that he had PACs ?-I will get a 2-week Zio patch to evaluate for arrhythmias ? ?2.  Family history of  CAD ?-I have recommended a coronary calcium score to assess future risk given his history of hyperlipidemia and family history of CAD.  He is completely asymptomatic from a cardiac standpoint ? ?Time Spent: ?20 minutes total time of encounter, including 15 minutes spent in face-to-face patient care on the date of this encounter. This time includes coordination of care and counseling regarding  above mentioned problem list. Remainder of non-face-to-face time involved reviewing chart documents/testing relevant to the patient encounter and documentation in the medical record. I have independently reviewed documentation from referring provider ? ?Medication Adjustments/Labs and Tests Ordered: ?Current medicines are reviewed at length with the patient today.  Concerns regarding medicines are outlined above.  Medication changes, Labs and Tests ordered today are listed in the Patient Instructions below. ? ?There are no Patient Instructions on file for this visit. ? ? ?Signed, ?Fransico Him, MD  ?02/01/2022 10:08 AM    ?[facility] ?[address], [place], [place]  [address] ?Phone: [phone]; Fax: [phone]  ? ?

## 2022-02-01 NOTE — Progress Notes (Unsigned)
Enrolled patient for a 14 day Zio XT  monitor to be mailed to patients home  °

## 2022-02-01 NOTE — Patient Instructions (Signed)
Medication Instructions:  ?Your physician recommends that you continue on your current medications as directed. Please refer to the Current Medication list given to you today. ? ?*If you need a refill on your cardiac medications before your next appointment, please call your pharmacy* ? ?Testing/Procedures: ?Your physician has requested that you have a calcium score CT scan. There is a $99 fee for the scan.  ? ?Your physician has recommended that you wear an event monitor. Event monitors are medical devices that record the heart?s electrical activity. Doctors most often [place] these monitors to diagnose arrhythmias. Arrhythmias are problems with the speed or rhythm of the heartbeat. The monitor is a small, portable device. You can wear one while you do your normal daily activities. This is usually used to diagnose what is causing palpitations/syncope (passing out). ? ?Follow-Up: ?At [facility], you and your health needs are our priority.  As part of our continuing mission to provide you with exceptional heart care, we have created designated Provider Care Teams.  These Care Teams include your primary Cardiologist (physician) and Advanced Practice Providers (APPs -  Physician Assistants and Nurse Practitioners) who all work together to provide you with the care you need, when you need it. ? ?Follow up with Dr. Radford Pax as needed based on results of testing ? ?Other Instructions ?ZIO XT- Long Term Monitor Instructions ? ?Your physician has requested you wear a ZIO patch monitor for 14 days.  ?This is a single patch monitor. [employer] supplies one patch monitor per enrollment. Additional ?stickers are not available. Please do not apply patch if you will be having a Nuclear Stress Test,  ?Echocardiogram, Cardiac CT, MRI, or Chest Xray during the period you would be wearing the  ?monitor. The patch cannot be worn during these tests. You cannot remove and re-apply the  ?ZIO XT patch monitor.  ?Your ZIO patch monitor will be  mailed 3 day USPS to your address on file. It may take 3-5 days  ?to receive your monitor after you have been enrolled.  ?Once you have received your monitor, please review the enclosed instructions. Your monitor  ?has already been registered assigning a specific monitor serial # to you. ? ?Billing and Patient Assistance Program Information ? ?We have supplied [employer] with any of your insurance information on file for billing purposes. ?[employer] offers a sliding scale Patient Assistance Program for patients that do not have  ?insurance, or whose insurance does not completely cover the cost of the ZIO monitor.  ?You must apply for the Patient Assistance Program to qualify for this discounted rate.  ?To apply, please call [employer] at [phone], select option 4, select option 2, ask to apply for  ?Patient Assistance Program. Theodore Demark will ask your household income, and how many people  ?are in your household. They will quote your out-of-pocket cost based on that information.  ?[employer] will also be able to set up a 24-month interest-free payment plan if needed. ? ?Applying the monitor ?  ?Shave hair from upper left chest.  ?Hold abrader disc by orange tab. Rub abrader in 40 strokes over the upper left chest as  ?indicated in your monitor instructions.  ?Clean area with 4 enclosed alcohol pads. Let dry.  ?Apply patch as indicated in monitor instructions. Patch will be placed under collarbone on left  ?side of chest with arrow pointing upward.  ?Rub patch adhesive wings for 2 minutes. Remove white label marked "1". Remove the white  ?label marked "2". Rub patch adhesive wings for  2 additional minutes.  ?While looking in a mirror, press and release button in center of patch. A small green light will  ?flash 3-4 times. This will be your only indicator that the monitor has been turned on.  ?Do not shower for the first 24 hours. You may shower after the first 24 hours.  ?Press the button if you feel a symptom. You will hear  a small click. Record Date, Time and  ?Symptom in the Patient Logbook.  ?When you are ready to remove the patch, follow instructions on the last 2 pages of Patient  ?Logbook. Stick patch monitor onto the last page of Patient Logbook.  ?Place Patient Logbook in the blue and white box. Use locking tab on box and tape box closed  ?securely. The blue and white box has prepaid postage on it. Please place it in the mailbox as  ?soon as possible. Your physician should have your test results approximately 7 days after the  ?monitor has been mailed back to [facility].  ?Call [facility] at [phone] if you have questions regarding  ?your ZIO XT patch monitor. Call them immediately if you see an orange light blinking on your  ?monitor.  ?If your monitor falls off in less than 4 days, contact our Monitor department at [phone].  ?If your monitor becomes loose or falls off after 4 days call [employer] at [phone] for  ?suggestions on securing your monitor ?  ?

## 2022-02-02 ENCOUNTER — Ambulatory Visit: Payer: Medicare HMO | Admitting: Speech Pathology

## 2022-02-02 ENCOUNTER — Encounter: Payer: Self-pay | Admitting: Cardiology

## 2022-02-02 ENCOUNTER — Ambulatory Visit: Payer: Medicare HMO | Admitting: Physical Therapy

## 2022-02-02 ENCOUNTER — Ambulatory Visit: Payer: Medicare HMO | Admitting: Occupational Therapy

## 2022-02-02 ENCOUNTER — Other Ambulatory Visit: Payer: Self-pay

## 2022-02-02 ENCOUNTER — Telehealth: Payer: Self-pay

## 2022-02-02 DIAGNOSIS — R29818 Other symptoms and signs involving the nervous system: Secondary | ICD-10-CM

## 2022-02-02 DIAGNOSIS — R2689 Other abnormalities of gait and mobility: Secondary | ICD-10-CM

## 2022-02-02 DIAGNOSIS — R471 Dysarthria and anarthria: Secondary | ICD-10-CM

## 2022-02-02 DIAGNOSIS — I700 Atherosclerosis of aorta: Secondary | ICD-10-CM

## 2022-02-02 DIAGNOSIS — R2681 Unsteadiness on feet: Secondary | ICD-10-CM

## 2022-02-02 DIAGNOSIS — R931 Abnormal findings on diagnostic imaging of heart and coronary circulation: Secondary | ICD-10-CM | POA: Insufficient documentation

## 2022-02-02 DIAGNOSIS — R278 Other lack of coordination: Secondary | ICD-10-CM

## 2022-02-02 DIAGNOSIS — R293 Abnormal posture: Secondary | ICD-10-CM

## 2022-02-02 DIAGNOSIS — Z8249 Family history of ischemic heart disease and other diseases of the circulatory system: Secondary | ICD-10-CM

## 2022-02-02 NOTE — Telephone Encounter (Signed)
The patient has been notified of the result and verbalized understanding.  All questions (if any) were answered. ?Antonieta Iba, RN 02/02/2022 3:27 PM  ?Labs have been scheduled.  ?

## 2022-02-02 NOTE — Telephone Encounter (Signed)
-----   Message from Sueanne Margarita, MD sent at 02/02/2022  9:35 AM EDT ----- ?Coronary calcium score was elevated at 533 which was 69th percentile for age gender and race matched controls.  There is also aortic atherosclerosis.  Please have him come in for a fasting lipid panel and ALT.  Continue aspirin 81 mg daily ?

## 2022-02-02 NOTE — Patient Instructions (Signed)
Keeping Thinking Skills Sharp: ?1. Jigsaw puzzles ?2. Card/board games ?3. Talking on the phone/social events ?4. [email or web address] ?5. Online games ?6. Word searches/crossword puzzles ?7.  Logic puzzles ?8. Aerobic exercise (stationary bike) ?9. Eating balanced diet (fruits & veggies) ?10. Drink water ?11. Try something new--new recipe, hobby ?12. Crafts ?13. Do a variety of activities that are challenging ?14.  Plan weekly meals and write a grocery list ?

## 2022-02-02 NOTE — Therapy (Signed)
[facility] ?[place], [place], [address] ?Phone: [phone]   Fax:  [phone] ? ?Speech Language Pathology Treatment ? ?Patient Details  ?Name: [name] ?MRN: [id number] ?Date of Birth: 10/19/1946 ?Referring Provider (SLP): Dr. Rexene Alberts ? ? ?Encounter Date: 02/02/2022 ? ? End of Session - 02/02/22 1216   ? ? Visit Number 9   ? Number of Visits 25   ? Date for SLP Re-Evaluation 04/07/22   ? SLP Start Time 1230   ? SLP Stop Time  1315   ? SLP Time Calculation (min) 45 min   ? Activity Tolerance Patient tolerated treatment well   ? ?  ?  ? ?  ? ? ?Past Medical History:  ?Diagnosis Date  ? Agatston coronary artery calcium score greater than 400   ? Coronary calcium score of 533. This was 69th percentile for age,  ? Aortic atherosclerosis ([place])   ? Hyperlipidemia LDL goal <70   ? Parkinson disease ([place])   ? ? ?Past Surgical History:  ?Procedure Laterality Date  ? TONSILLECTOMY    ? ? ?There were Bradley vitals filed for this visit. ? ? Subjective Assessment - 02/02/22 1230   ? ? Subjective "I've been doing my exercises."   ? Currently in Pain? Bradley/denies   ? ?  ?  ? ?  ? ? ? ? ? ? ? ? ADULT SLP TREATMENT - 02/02/22 1218   ? ?  ? General Information  ? Behavior/Cognition Alert;Cooperative;Pleasant mood   ?  ? Treatment Provided  ? Treatment provided Cognitive-Linquistic   ?  ? Cognitive-Linquistic Treatment  ? 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 Skilled Treatment Target improving vocal quality and increasing intensity through progressively difficulty speech tasks using Speak Out! program, lesson 10. Averages this date: loud "ah" 85 dB; reading 72 dB; cognitive speech task 74 dB. Rare min A rrquired this date during structured practice. Continues to demonstrate increasing awareness of using appropriate breath. Conversational sample of approx 5 minutes, pt averages 67 dB with usual mod A. Pt using water with swallow vs. throat clears this date, ST  notes ## of throat clears over 45 minute session.   ?  ? Assessment / Recommendations / Plan  ? Plan Continue with current plan of care   ?  ? Progression Toward Goals  ? Progression toward goals Progressing toward goals   ? ?  ?  ? ?  ? ? ? ? ? SLP Short Term Goals - 02/02/22 1216   ? ?  ? SLP SHORT TERM GOAL #1  ? Title pt will produce loud "ah" averaging 90 dB over 5 trials with rare min A across 2 sessions   ? Baseline 01/24/22; 01/31/22   ? Status Achieved   ?  ? SLP SHORT TERM GOAL #2  ? Title pt will average 70-72 dB across 5 minute conversational sample   ? Baseline 02/02/22 increased average to high 60s with usual min A   ? Status Partially Met   ?  ? SLP SHORT TERM GOAL #3  ? Title pt with average 70-72 dB at sentence level 18/20 sentences over 2 sessions   ? Baseline 01/24/22; 01/31/22   ? Status Achieved   ?  ? SLP SHORT TERM GOAL #4  ? Title pt will have 10 or less throat clears over 45 minute session   ? Baseline 01/24/22; 02/02/22   ? Status Achieved   ? ?  ?  ? ?  ? ? ?  SLP Long Term Goals - 02/02/22 1218   ? ?  ? SLP LONG TERM GOAL #1  ? Title pt will average 90 dB with x5 loud "ah" mod I over 3 sessions   ? Time 8   ? Period Weeks   ? Status On-going   ?  ? SLP LONG TERM GOAL #2  ? Title pt will average 70-72 dB over 20 minute conversation with rare min A   ? Time 8   ? Period Weeks   ? Status On-going   ?  ? SLP LONG TERM GOAL #3  ? Title pt/spouse report 50% decrease in requests for repetition subjectively at home   ? Time 8   ? Period Weeks   ? Status On-going   ?  ? SLP LONG TERM GOAL #4  ? Title pt/spouse will report compliance with vocal hygiene recommendations over 1 week period   ? Time 8   ? Period Weeks   ? Status On-going   ? ?  ?  ? ?  ? ? ? Plan - 02/02/22 1216   ? ? Clinical Impression Statement [name] has Parkinson's and presents with reduced vocal intensity/volume and hoarseness during speech.  Pt demonstrating improvements in vocal intensity and vocal quality during structured speech  tasks in therapy, requires mod A to sustain during spontanous productions. Pt's spontaneous speech averages in low 60s dB. 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   ? Speech Therapy Frequency 2x / week   ? Duration 12 weeks   ? Treatment/Interventions Functional tasks;Cueing hierarchy;Environmental controls;Patient/family education;SLP instruction and feedback;Internal/external aids;Compensatory strategies;Compensatory techniques   ? Potential to Achieve Goals Good   ? Potential Considerations Medical prognosis   ? SLP Home Exercise Plan loud "ah"; oral reading, vocal glides   ? Consulted and Agree with Plan of Care Patient;Family member/caregiver   ? Family Member Consulted wife, [name]   ? ?  ?  ? ?  ? ? ?Patient will benefit from skilled therapeutic intervention in order to improve the following deficits and impairments:   ?Dysarthria ? ? ? ?Problem List ?Patient Active Problem List  ? Diagnosis Date Noted  ? 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 Aortic atherosclerosis ([place]) 02/01/2022  ? Irregular heart rate 01/12/2022  ? OSA (obstructive sleep apnea) 01/12/2022  ? Parkinson's disease ([place]) 11/25/2020  ? Edema of right lower leg 11/25/2020  ? Hyperlipidemia LDL goal <70 11/25/2020  ? 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 Hearing loss 11/25/2020  ? Pityriasis rosea 11/25/2020  ? ? ?Su Monks, CF-SLP ?02/02/2022, 1:16 PM ? ?[facility] ?[address] ?[place], [place], [address] ?Phone: [phone]   Fax:  [phone] ? ? ?Name: [name] ?MRN: [id number] ?Date of Birth: October 12, 1946 ? ?

## 2022-02-02 NOTE — Therapy (Signed)
[facility] ?[address] ?[place], [place], [address] ?Phone: [phone]   Fax:  [phone] ? ?Occupational Therapy Treatment ? ?Patient Details  ?Name: [name] ?MRN: [id number] ?Date of Birth: 1946/11/04 ?Referring Provider (OT): Dr. Rexene Alberts ? ? ?Encounter Date: 02/02/2022 ? ? OT End of Session - 02/02/22 1624   ? ? Visit Number 8   ? Number of Visits 25   ? Date for OT Re-Evaluation 03/31/22   ? Authorization Type [employer]   ? Authorization Time Period 60 days- POC written for 12 weeks anticiapte d/c following 6-8 weeks   ? Authorization - Visit Number 8   ? Authorization - Number of Visits 10   ? Progress Note Due on Visit 10   ? OT Start Time 1402   pt in BR 2 units only  ? OT Stop Time 6389   ? OT Time Calculation (min) 43 min   ? Activity Tolerance Patient tolerated treatment well   ? Behavior During Therapy [facility] for tasks assessed/performed   ? ?  ?  ? ?  ? ? ?Past Medical History:  ?Diagnosis Date  ? Agatston coronary artery calcium score greater than 400   ? Coronary calcium score of 533. This was 69th percentile for age,  ? Aortic atherosclerosis ([place])   ? Hyperlipidemia LDL goal <70   ? Parkinson disease ([place])   ? ? ?Past Surgical History:  ?Procedure Laterality Date  ? TONSILLECTOMY    ? ? ?There were no vitals filed for this visit. ? ? Subjective Assessment - 02/02/22 1402   ? ? Subjective  Denies pain   ? Pertinent History PD diganosed in 2019   cellulitis, lymphedema, low back pain, hyperlipidemia,   ? Patient Stated Goals mobility, maintain independence   ? Currently in Pain? No/denies   ? ?  ?  ? ?  ? ? ? ? ? ?Treatment: Pt. performed PWR! Basic 4 in standing, all exercises at counter, with exception of PWR! twist in corner, min-mod v.c for amplitude and posture.  ?Therapist issued PWR! Up, rock and step, for pt to perform at countertop at home. ?PWR1 hands, PWR! up ? ? ? ? ? ? ? ? ? ? ? ? ? ? ? ? ? OT Education - 02/02/22 1622   ? ?  Education Details PWR! up, rock and step in standing at counter min v.c for amplitude  and positioning, 10 reps each, Handwriting activities with min v.c for speed and letter size, pt practiced cursive and printing, min v.c to pause when handwriting became small and between words, min v.c for PWR! hands. Keeping thinking skills sharp- handout issued   ? Person(s) Educated Patient   ? Methods Explanation;Demonstration;Verbal cues;Handout   ? Comprehension Verbalized understanding;Returned demonstration;Verbal cues required   ? ?  ?  ? ?  ? ? ? OT Short Term Goals - 02/02/22 1429   ? ?  ? OT SHORT TERM GOAL #1  ? Title I with PD specific HEP   ? Time 4   ? Period Weeks   ? Status On-going   needs reinforcement  ? Target Date 02/03/22   ?  ? OT SHORT TERM GOAL #2  ? Title Pt will verbalize understanding of adapted strategies to maximize safety and I with ADLs/ IADLs .   ? Time 4   ? Period Weeks   ? Status On-going   ?  ? OT SHORT TERM GOAL #3  ? Title  Pt will demonstrate improved fine motor coordination for ADLs as evidenced by decreasing 9 hole peg test score for RUE by 3 secs   ? Baseline R 38.25, L 33.75   ? Time 4   ? Period Weeks   ? Status Achieved   01/31/22:  R-27.62sec, L-29.53sec  ?  ? OT SHORT TERM GOAL #4  ? Title Pt will demonstrated improved LUE functional use as evidenced by increasing box/ blocks by 3 blocks   ? Baseline R 45, L 39   ? Time 4   ? Period Weeks   ? Status Achieved   01/31/22:  R-46 blocks, L-43 blocks  ?  ? OT SHORT TERM GOAL #5  ? Title Pt will verbalize understanding of ways to keep thinking skills sharp.   ? Time 4   ? Period Weeks   ? Status Achieved   ? ?  ?  ? ?  ? ? ? ? OT Long Term Goals - 02/02/22 1627   ? ?  ? OT LONG TERM GOAL #1  ? Title Pt will verbalize understanding of ways to prevent future PD related complications and PD community resources.   ? Time 12   ? Period Weeks   ? Status On-going   ? Target Date 03/31/22   ?  ? OT LONG TERM GOAL #2  ? Title Pt will demonstrate  improved ease with feeding as evidenced by decreasing PPT#2 (self feeding) by 3 secs   ? Baseline 14.19 secs   ? Time 12   ? Period Weeks   ? Status On-going   ?  ? OT LONG TERM GOAL #3  ? Title Pt will retrieve an item at 130 shoulder flexion and -20 elbow extension with RUE.   ? Baseline 125, -25   ? Time 12   ? Period Weeks   ? Status On-going   ?  ? OT LONG TERM GOAL #4  ? Title Pt will demonstrate ability to write a short paragraph with 100% legibility and no significant decrease in letter size.   ? Time 12   ? Period Weeks   ? Status On-going   ? ?  ?  ? ?  ? ? ? ? ? ? ? ? Plan - 02/02/22 1626   ? ? Clinical Impression Statement Pt continues to progress towards goals. Pt demonstates understanding of PWR! up, rock and step in standing, however he may bneefit from review.   ? OT Occupational Profile and History Detailed Assessment- Review of Records and additional review of physical, cognitive, psychosocial history related to current functional performance   ? Occupational performance deficits (Please refer to evaluation for details): ADL's;IADL's;Leisure;Play;Social Participation   ? Body Structure / Function / Physical Skills ADL;Balance;Endurance;Mobility;Strength;Flexibility;UE functional use;FMC;Gait;Coordination;ROM;GMC;Decreased knowledge of precautions;Decreased knowledge of use of DME;IADL;Dexterity   ? Rehab Potential Good   ? Clinical Decision Making Limited treatment options, no task modification necessary   ? Comorbidities Affecting Occupational Performance: May have comorbidities impacting occupational performance   ? Modification or Assistance to Complete Evaluation  No modification of tasks or assist necessary to complete eval   ? OT Frequency 2x / week   plus eval, POC written for 12 weeks anticipate d/c after 6-8 weeks  ? OT Duration 12 weeks   ? OT Treatment/Interventions Self-care/ADL training;Therapeutic exercise;Functional Mobility Training;Balance training;Aquatic  Therapy;Ultrasound;Neuromuscular education;Splinting;Manual Therapy;Therapeutic activities;DME and/or AE instruction;Cognitive remediation/compensation;Gait Training;Moist Heat;Fluidtherapy;Paraffin;Cryotherapy;Passive range of motion;Patient/family education   ? Plan continue checking STG's   ? Consulted and  Agree with Plan of Care Patient   ? ?  ?  ? ?  ? ? ?Patient will benefit from skilled therapeutic intervention in order to improve the following deficits and impairments:   ?Body Structure / Function / Physical Skills: ADL, Balance, Endurance, Mobility, Strength, Flexibility, UE functional use, FMC, Gait, Coordination, ROM, GMC, Decreased knowledge of precautions, Decreased knowledge of use of DME, IADL, Dexterity ?  ?  ? ? ?Visit Diagnosis: ?Other symptoms and signs involving the nervous system ? ?Other lack of coordination ? ?Abnormal posture ? ?Unsteadiness on feet ? ?Other abnormalities of gait and mobility ? ? ? ?Problem List ?Patient Active Problem List  ? Diagnosis Date Noted  ? 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 Aortic atherosclerosis ([place]) 02/01/2022  ? Irregular heart rate 01/12/2022  ? OSA (obstructive sleep apnea) 01/12/2022  ? Parkinson's disease ([place]) 11/25/2020  ? Edema of right lower leg 11/25/2020  ? Hyperlipidemia LDL goal <70 11/25/2020  ? 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 Hearing loss 11/25/2020  ? Pityriasis rosea 11/25/2020  ? ? ?Rielle Schlauch, OT ?02/02/2022, 4:27 PM ? ?[facility] ?[address] ?[place], [place], [address] ?Phone: [phone]   Fax:  [phone] ? ?Name: [name] ?MRN: [id number] ?Date of Birth: 14-Jan-1946 ? ?

## 2022-02-02 NOTE — Therapy (Signed)
?OUTPATIENT PHYSICAL THERAPY TREATMENT NOTE ? ? ?Patient Name: [name] ?MRN: [id number] ?DOB:[birth year], 76 y.o., male ?Today's Date: 02/02/2022 ? ?PCP: Lesleigh Noe, MD ?REFERRING PROVIDER: Star Age, MD  ? ? PT End of Session - 02/02/22 1623   ? ? Visit Number 9   ? Number of Visits 17   Plus eval  ? Date for PT Re-Evaluation 03/30/22   ? Authorization Type [employer]   ? Authorization Time Period 01/05/22 - 03/30/22   ? PT Start Time 4098   ? PT Stop Time 1400   ? PT Time Calculation (min) 43 min   ? Equipment Utilized During Treatment Gait belt   ? Activity Tolerance Patient tolerated treatment well   ? Behavior During Therapy [facility] for tasks assessed/performed   ? ?  ?  ? ?  ? ? ? ? ? ? ? ? ?Past Medical History:  ?Diagnosis Date  ? Agatston coronary artery calcium score greater than 400   ? Coronary calcium score of 533. This was 69th percentile for age,  ? Aortic atherosclerosis ([place])   ? Hyperlipidemia LDL goal <70   ? Parkinson disease ([place])   ? ?Past Surgical History:  ?Procedure Laterality Date  ? TONSILLECTOMY    ? ?Patient Active Problem List  ? Diagnosis Date Noted  ? 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 Aortic atherosclerosis ([place]) 02/01/2022  ? Irregular heart rate 01/12/2022  ? OSA (obstructive sleep apnea) 01/12/2022  ? Parkinson's disease ([place]) 11/25/2020  ? Edema of right lower leg 11/25/2020  ? Hyperlipidemia LDL goal <70 11/25/2020  ? 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 Hearing loss 11/25/2020  ? Pityriasis rosea 11/25/2020  ? ? ?REFERRING DIAG: G20 (ICD-10-CM) - Parkinson's disease ([place])  ? ?THERAPY DIAG:  ?Abnormal posture ? ?Unsteadiness on feet ? ?Other abnormalities of gait and mobility ? ?PERTINENT HISTORY: HLD, cellulitis, lymphedema, low back pain and hearing loss. ? ?PRECAUTIONS: Fall ? ?SUBJECTIVE: No new changes, reports exercises are still challenging but overall going well. Excited for the weather to warm up  ? ?PAIN:  ?Are you  having pain? No ? ? ?TODAY'S TREATMENT:  ?Gait ?The following treadmill training was completed for aerobic/neural priming, endurance, and gait speed.  ?- Warmup: 2:00 up to 2.0 mph ?- HIIT: 4:00 30sec ON/OFF alternating ball kicks / walking at 2.0 mph   ?-Cool down: 60 seconds at 2.0 mph, 30 seconds at 1.0 mph ? ?Gait pattern: step through pattern, decreased arm swing- Left, decreased step length- Left, festinating, and trunk flexed ?Distance walked: 230' ?Assistive device utilized: None ?Level of assistance: CGA ?Comments: Noted increased festination and forward flexed posture w/turns and as pt fatigued.  ? ? ?NMR  ?Alt. Kb swing to sumo deadlift high pull w/10# KB for set-switching, posterior LOB correction and triple extension (Hip, knee, ankle). Pt performed x20 reps and began to fall posteriorly onto mat around rep 10 due to fatigue and inability to maintain anterior weight shift w/high pull. Mod verbal and visual cues for proper form and sequencing, CGA throughout.  ? ?Squat jumps x15 from mat w/RW propped in front of pt for safety for high velocity movement, hip and trunk extension and immediate standing balance. Max verbal cues to avoid falling back onto mat after jump and to control squat eccentrically, which was difficult for pt. Pt able to clear bilat feet off ground but unable to achieve full trunk extension without losing balance posteriorly. CGA throughout for safety.  ? ? ? ?  PATIENT EDUCATION: ?Education details: Goal progression, POC moving forward ?Person educated: Patient ?Education method: Explanation and handout  ?Education comprehension: verbalized understanding ? ? ?HOME EXERCISE PROGRAM: ?Access Code: [email or web address] ?URL: [email or web address] ?Date: 01/26/2022 ?Prepared by: Mickie Bail Walter Min ? ?Exercises ?Wall push up with strong push away - 1 x daily - 7 x weekly - 3 sets - 10 reps ?Quadruped Thoracic Rotation - Reach Under - 1 x daily - 7 x weekly - 3 sets - 10 reps ?Half-Kneeling  Forward Lean - 1 x daily - 7 x weekly - 3 sets - 10 reps ?Weighted Ab Twist - 1 x daily - 7 x weekly - 3 sets - 10 reps ?Quadruped rock back - 1 x daily - 7 x weekly - 3 sets - 10 reps ?Modified plank jacks with wall - 1 x daily - 7 x weekly - 3 sets - 10 reps ? ? ? ? PT Short Term Goals - 01/06/22 0806   ? ?  ? PT SHORT TERM GOAL #1  ? Title Pt will be independent with initial HEP with wife's supervision in order to build upon functional gains made in therapy. ALL STGS DUE 02/03/22   ? Time 4   ? Period Weeks   ? Status Achieved   ? Target Date 02/03/22   ?  ? PT SHORT TERM GOAL #2  ? Title Pt will maintain gait speed of 2.8 ft/sec with LRAD with supervision in order to demo improved community ambulation.   ? Baseline 2.77 ft/sec with no AD - min guard; 3.3 ft/s without RW and 3.23 ft/s with AD  ? Time 4   ? Period Weeks   ? Status Achieved   ?  ? PT SHORT TERM GOAL #3  ? Title Pt will improve 5x sit <> stand with no UE support to 19 seconds or less in order to demo improved functional strength/decr fall risk.   ? Baseline 22.97 seconds, 12.5s on 3/21    ? Time 4   ? Period Weeks   ? Status Achieved   ?  ? PT SHORT TERM GOAL #4  ? Title Pt will ambulate at least 230' over level indoor surfaces with LRAD with supervision for improved safety with mobility.   ? Baseline no AD- needs min guard and min A for balance at times; no AD, CGA   ? Time 4   ? Period Weeks   ? Status Acheived   ?  ? PT SHORT TERM GOAL #5  ? Title Pt and pt's wife will verbalize understanding of fall prevention in the home.   ? Time 4   ? Period Weeks   ? Status Achieved   ? ?  ?  ? ?  ? ? ? PT Long Term Goals - 01/06/22 0818   ? ?  ? PT LONG TERM GOAL #1  ? Title Pt will be independent with final HEP with wife's supervision in order to build upon functional gains made in therapy. ALL LTGS DUE 03/03/22   ? Time 8   ? Period Weeks   ? Status New   ? Target Date 03/03/22   ?  ? PT LONG TERM GOAL #2  ? Title Pt will improve miniBEST to at least a  18/28 in order to demo decr fall risk.   ? Baseline 12/28   ? Time 8   ? Period Weeks   ? Status New   ?  ? PT LONG TERM GOAL #3  ?  Title Pt will improve 5x sit <> stand with no UE support to 15 seconds or less in order to demo improved functional strength/decr fall risk.   ? Baseline 22.97 seconds   ? Time 8   ? Period Weeks   ? Status New   ?  ? PT LONG TERM GOAL #4  ? Title Pt will ambulate at least 400' over outdoor paved surfaces with LRAD with mod I in order to demo improved community mobility.   ? Time 8   ? Period Weeks   ? Status New   ?  ? PT LONG TERM GOAL #5  ? Title Pt will verbalize understanding of local Parkinson's disease resources,   ? Time 8   ? Period Weeks   ? Status New   ?  ? Additional Long Term Goals  ? Additional Long Term Goals Yes   ?  ? PT LONG TERM GOAL #6  ? Title Pt will recover posterior and anterior balance in push and release test in 2 steps independently, for improved balance recovery   ? Time 8   ? Period Weeks   ? Status New   ? ?  ?  ? ?  ? ? ? Plan - 01/26/22 1458   ? ? Clinical Impression Statement Emphasis of skilled PT session on  assessing remainder of STGs, gait training and plyometrics. Pt met 5 of 5 STGs and will need his LTGs updated to reflect the significant progress in PT. Pt demonstrates good safety awareness in his home environment and is diligent regarding performing his HEP. Pt continues to be challenged by jumping and high velocity movements of L hemibody. Continue POC.   ? Personal Factors and Comorbidities Age;Fitness;Past/Current Experience;Comorbidity 1;Time since onset of injury/illness/exacerbation   ? Comorbidities Cellulitis   ? Examination-Activity Limitations Bend;Carry;Reach Overhead;Locomotion Level;Lift;Stairs;Squat;Stand;Transfers   ? Examination-Participation Restrictions Community Activity   ? Stability/Clinical Decision Making Evolving/Moderate complexity   ? Rehab Potential Good   ? PT Frequency 2x / week   ? PT Duration 12 weeks   ? PT  Treatment/Interventions ADLs/Self Care Home Management;Gait training;DME Instruction;Stair training;Functional mobility training;Therapeutic activities;Therapeutic exercise;Balance training;Neuromuscular re-education;Co

## 2022-02-04 DIAGNOSIS — I499 Cardiac arrhythmia, unspecified: Secondary | ICD-10-CM | POA: Diagnosis not present

## 2022-02-04 DIAGNOSIS — Z8249 Family history of ischemic heart disease and other diseases of the circulatory system: Secondary | ICD-10-CM

## 2022-02-06 ENCOUNTER — Other Ambulatory Visit: Payer: Self-pay

## 2022-02-06 ENCOUNTER — Other Ambulatory Visit: Payer: Medicare HMO | Admitting: *Deleted

## 2022-02-06 DIAGNOSIS — Z8249 Family history of ischemic heart disease and other diseases of the circulatory system: Secondary | ICD-10-CM

## 2022-02-06 DIAGNOSIS — I700 Atherosclerosis of aorta: Secondary | ICD-10-CM | POA: Diagnosis not present

## 2022-02-06 DIAGNOSIS — R931 Abnormal findings on diagnostic imaging of heart and coronary circulation: Secondary | ICD-10-CM

## 2022-02-06 LAB — LIPID PANEL
Chol/HDL Ratio: 2.7 ratio (ref 0.0–5.0)
Cholesterol, Total: 139 mg/dL (ref 100–199)
HDL: 52 mg/dL (ref >39)
LDL Chol Calc (NIH): 75 mg/dL (ref 0–99)
Triglycerides: 55 mg/dL (ref 0–149)
VLDL Cholesterol Cal: 12 mg/dL (ref 5–40)

## 2022-02-06 LAB — ALT: ALT: 10 IU/L (ref 0–44)

## 2022-02-07 ENCOUNTER — Ambulatory Visit: Payer: Medicare HMO | Admitting: Speech Pathology

## 2022-02-07 ENCOUNTER — Telehealth: Payer: Self-pay

## 2022-02-07 ENCOUNTER — Ambulatory Visit: Payer: Medicare HMO | Admitting: Physical Therapy

## 2022-02-07 ENCOUNTER — Ambulatory Visit: Payer: Medicare HMO | Admitting: Occupational Therapy

## 2022-02-07 DIAGNOSIS — R278 Other lack of coordination: Secondary | ICD-10-CM | POA: Diagnosis not present

## 2022-02-07 DIAGNOSIS — R2689 Other abnormalities of gait and mobility: Secondary | ICD-10-CM

## 2022-02-07 DIAGNOSIS — R293 Abnormal posture: Secondary | ICD-10-CM | POA: Diagnosis not present

## 2022-02-07 DIAGNOSIS — R931 Abnormal findings on diagnostic imaging of heart and coronary circulation: Secondary | ICD-10-CM

## 2022-02-07 DIAGNOSIS — R2681 Unsteadiness on feet: Secondary | ICD-10-CM

## 2022-02-07 DIAGNOSIS — R471 Dysarthria and anarthria: Secondary | ICD-10-CM

## 2022-02-07 DIAGNOSIS — I700 Atherosclerosis of aorta: Secondary | ICD-10-CM

## 2022-02-07 DIAGNOSIS — R29818 Other symptoms and signs involving the nervous system: Secondary | ICD-10-CM

## 2022-02-07 MED ORDER — ROSUVASTATIN CALCIUM 10 MG PO TABS: 10.0000 mg | ORAL_TABLET | Freq: Every day | ORAL | 3 refills | Status: DC

## 2022-02-07 NOTE — Telephone Encounter (Signed)
-----   Message from Sueanne Margarita, MD sent at 02/07/2022 11:22 AM EDT ----- ?LDL is not at goal of less than 70.  Please start Crestor 10 mg daily and repeat FLP and ALT in 6 weeks ?

## 2022-02-07 NOTE — Telephone Encounter (Signed)
DME: [facility] ?Phone: [phone], press option 1 ?Fax: [phone] ?[facility]  ?Setup 02/07/22 (initial appt needed 03/10/22-05/08/22)  ? ?Pt's appt will be 03/13/22 at 1:15 PM ?

## 2022-02-07 NOTE — Telephone Encounter (Signed)
The patient's wife has been notified of the result and verbalized understanding.  All questions (if any) were answered. ?Antonieta Iba, RN 02/07/2022 1:59 PM  ?Rx has been sent in. Labs have been scheduled. ?

## 2022-02-07 NOTE — Therapy (Signed)
[facility] ?[place], [place], [address] ?Phone: [phone]   Fax:  [phone] ? ?Speech Language Pathology Treatment/ Progress Note ? ?Patient Details  ?Name: [name] ?MRN: [id number] ?Date of Birth: [birth year] ?Referring Provider (SLP): Dr. Rexene Bradley ? ? ?Encounter Date: 02/07/2022 ? ? End of Session - 02/07/22 1413   ? ? Visit Number 10   ? Number of Visits 25   ? Date for SLP Re-Evaluation 04/07/22   ? SLP Start Time 1400   ? SLP Stop Time  1445   ? SLP Time Calculation (min) 45 min   ? Activity Tolerance Patient tolerated treatment well   ? ?  ?  ? ?  ? ? ?Past Medical History:  ?Diagnosis Date  ? Agatston coronary artery calcium score greater than 400   ? Coronary calcium score of 533. This was 69th percentile for age,  ? Aortic atherosclerosis ([place])   ? Hyperlipidemia LDL goal <70   ? Parkinson disease ([place])   ? ? ?Past Surgical History:  ?Procedure Laterality Date  ? TONSILLECTOMY    ? ? ?There were no vitals filed for this visit. ? ? Subjective Assessment - 02/07/22 1411   ? ? Subjective "I had a good weekend, did all my exercises."   ? Currently in Pain? No/denies   ? ?  ?  ? ?  ? ?Dates of Reporting Period: 01/05/2022 to 02/07/2022 ? ?Objective Reports of Subjective Statement: Pt reports improvement in ability to project voice and reports less instances of his wife asking for repetitions at home.  ? ?Objective Measurements: Pt has increased average intensity in conversational speech from low 60s dB to high 60s dB. Increased vocal intensity observed in structured speech tasks with cues fading from max to occasional min A. Maintains intelligibility in quiet room.  ? ?Goal Update: see below ? ?Plan: continue POC as written  ? ?Reason Skilled Services are Required: To improve pt's ability to effectively communicate at home and in community.  ? ? ? ? ? ? ADULT SLP TREATMENT - 02/07/22 0001   ? ?  ? General Information  ?  Behavior/Cognition Alert;Cooperative;Pleasant mood   ?  ? Treatment Provided  ? Treatment provided Cognitive-Linquistic   ?  ? Cognitive-Linquistic Treatment  ? 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 Skilled Treatment Pt enters session with increased hoarse vocal quality, decreased loudness from baseline. Tells ST liekly 2/2 allergies, forgot to take allergy medication this AM. Target improving vocal quality and increasing intensity through progressively difficulty speech tasks using Speak Out! program, lesson 15. Averages this date: loud "ah" 78 dB; reading 67 dB; cognitive speech task 72 dB. Usual mod A required this date during structured practice.  Conversational sample of approx 5 minutes, pt averages 67 dB with usual mod A. ST notes >15 throat clears over 45 minute session.   ?  ? Assessment / Recommendations / Plan  ? Plan Continue with current plan of care   ?  ? Progression Toward Goals  ? Progression toward goals Progressing toward goals   ? ?  ?  ? ?  ? ? ? ? ? SLP Short Term Goals - 02/07/22 1414   ? ?  ? SLP SHORT TERM GOAL #1  ? Title pt will produce loud "ah" averaging 90 dB over 5 trials with rare min A across 2 sessions   ? Baseline 01/24/22; 01/31/22   ? Status Achieved   ?  ? SLP  SHORT TERM GOAL #2  ? Title pt will average 70-72 dB across 5 minute conversational sample   ? Baseline 02/02/22 increased average to high 60s with usual min A   ? Status Partially Met   ?  ? SLP SHORT TERM GOAL #3  ? Title pt with average 70-72 dB at sentence level 18/20 sentences over 2 sessions   ? Baseline 01/24/22; 01/31/22   ? Status Achieved   ?  ? SLP SHORT TERM GOAL #4  ? Title pt will have 10 or less throat clears over 45 minute session   ? Baseline 01/24/22; 02/02/22   ? Status Achieved   ? ?  ?  ? ?  ? ? ? SLP Long Term Goals - 02/07/22 1418   ? ?  ? SLP LONG TERM GOAL #1  ? Title pt will average 90 dB with x5 loud "ah" mod I over 3 sessions   ? Time 7   ? Period Weeks   ? Status On-going   ?  ? SLP LONG TERM GOAL  #2  ? Title pt will average 70-72 dB over 20 minute conversation with rare min A   ? Time 7   ? Period Weeks   ? Status On-going   ?  ? SLP LONG TERM GOAL #3  ? Title pt/spouse report 50% decrease in requests for repetition subjectively at home   ? Time 7   ? Period Weeks   ? Status On-going   ?  ? SLP LONG TERM GOAL #4  ? Title pt/spouse will report compliance with vocal hygiene recommendations over 1 week period   ? Time 7   ? Period Weeks   ? Status On-going   ? ?  ?  ? ?  ? ? ? Plan - 02/07/22 1414   ? ? Clinical Impression Statement [name] has Parkinson's and presents with reduced vocal intensity/volume and hoarseness during speech.  Pt demonstrating improvements in vocal intensity and vocal quality during structured speech tasks in therapy, requires mod A to sustain during spontanous productions. Pt's spontaneous speech averages in low 60s dB. 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   ? Speech Therapy Frequency 2x / week   ? Duration 12 weeks   ? Treatment/Interventions Functional tasks;Cueing hierarchy;Environmental controls;Patient/family education;SLP instruction and feedback;Internal/external aids;Compensatory strategies;Compensatory techniques   ? Potential to Achieve Goals Good   ? Potential Considerations Medical prognosis   ? SLP Home Exercise Plan loud "ah"; oral reading, vocal glides   ? Consulted and Agree with Plan of Care Patient;Family member/caregiver   ? Family Member Consulted wife, [name]   ? ?  ?  ? ?  ? ? ?Patient will benefit from skilled therapeutic intervention in order to improve the following deficits and impairments:   ?Dysarthria ? ? ? ?Problem List ?Patient Active Problem List  ? Diagnosis Date Noted  ? 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 Aortic atherosclerosis ([place]) 02/01/2022  ? Irregular heart rate 01/12/2022  ? OSA (obstructive sleep apnea) 01/12/2022  ? Parkinson's disease ([place]) 11/25/2020  ? Edema of right  lower leg 11/25/2020  ? Hyperlipidemia LDL goal <70 11/25/2020  ? 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 Hearing loss 11/25/2020  ? Pityriasis rosea 11/25/2020  ? ? ?Su Monks, CF-SLP ?02/07/2022, 3:17 PM ? ?[facility] ?[address] ?[place], [place], [address] ?Phone: [phone]   Fax:  [phone] ? ? ?Name: Donald  Koren Bradley ?MRN: [id number] ?Date of Birth: Feb 04, 1946 ? ?

## 2022-02-07 NOTE — Therapy (Signed)
[facility] ?[place] ?[place], [place], [address] ?Phone: [phone]   Fax:  [phone] ? ?Occupational Therapy Treatment ? ?Patient Details  ?Name: [name] ?MRN: [id number] ?Date of Birth: 08/08/1946 ?Referring Provider (OT): Dr. Rexene Alberts ? ? ?Encounter Date: 02/07/2022 ? ? OT End of Session - 02/07/22 1533   ? ? Visit Number 9   ? Number of Visits 25   ? Date for OT Re-Evaluation 03/31/22   ? Authorization Type [employer]   ? Authorization Time Period 60 days- POC written for 12 weeks anticiapte d/c following 6-8 weeks   ? Authorization - Visit Number 9   ? Authorization - Number of Visits 10   ? Progress Note Due on Visit 10   ? OT Start Time 1234   ? OT Stop Time 1315   ? OT Time Calculation (min) 41 min   ? Activity Tolerance Patient tolerated treatment well   ? Behavior During Therapy [facility] for tasks assessed/performed   ? ?  ?  ? ?  ? ? ?Past Medical History:  ?Diagnosis Date  ? Agatston coronary artery calcium score greater than 400   ? Coronary calcium score of 533. This was 69th percentile for age,  ? Aortic atherosclerosis ([place])   ? Hyperlipidemia LDL goal <70   ? Parkinson disease ([place])   ? ? ?Past Surgical History:  ?Procedure Laterality Date  ? TONSILLECTOMY    ? ? ?There were no vitals filed for this visit. ? ? Subjective Assessment - 02/07/22 1236   ? ? Subjective  Pt reports I have C-pap starting   ? Pertinent History PD diganosed in 2019   cellulitis, lymphedema, low back pain, hyperlipidemia,   ? Patient Stated Goals mobility, maintain independence   ? Currently in Pain? No/denies   ? ?  ?  ? ?  ? ? ? ? ? ? ? ? ? ?Treatment:  dynamic step and reach to left and right sides with trunk rotation, min v.c for amplitude and posture, ?Standing for overhead functional reaching  to place large pegs into pegboard with right and left UE's alternately, with emphasis on upright posture, min v.c  ? ?Arm bike x 6 mins level 1 for  conditioning ? ? ? ? ? ? ? ? ? ? ? OT Education - 02/07/22 1527   ? ? Education Details PWR! up, rock and step in standing at counter min v.c for amplitude  and positioning, 10 reps each, pt was cued to keep both hands on countertop if he was unsteady with PWR! step, pt practiced performing in this way, min v.c  ?Pt was shown PWR! moves modified quadraped for PWR! twist  at tabletop, therapist verbally discussed PWR! rock  in modified quadraped, Armed forces training and education officer.   ? Person(s) Educated Patient   ? Methods Explanation;Demonstration;Verbal cues;Handout   ? Comprehension Verbalized understanding;Returned demonstration;Verbal cues required   ? ?  ?  ? ?  ? ? ? OT Short Term Goals - 02/07/22 1255   ? ?  ? OT SHORT TERM GOAL #1  ? Title I with PD specific HEP   ? Time 4   ? Period Weeks   ? Status Achieved   ? Target Date 02/03/22   ?  ? OT SHORT TERM GOAL #2  ? Title Pt will verbalize understanding of adapted strategies to maximize safety and I with ADLs/ IADLs .   ? Time 4   ? Period Weeks   ?  Status On-going   ?  ? OT SHORT TERM GOAL #3  ? Title Pt will demonstrate improved fine motor coordination for ADLs as evidenced by decreasing 9 hole peg test score for RUE by 3 secs   ? Baseline R 38.25, L 33.75   ? Time 4   ? Period Weeks   ? Status Achieved   01/31/22:  R-27.62sec, L-29.53sec  ?  ? OT SHORT TERM GOAL #4  ? Title Pt will demonstrated improved LUE functional use as evidenced by increasing box/ blocks by 3 blocks   ? Baseline R 45, L 39   ? Time 4   ? Period Weeks   ? Status Achieved   01/31/22:  R-46 blocks, L-43 blocks  ?  ? OT SHORT TERM GOAL #5  ? Title Pt will verbalize understanding of ways to keep thinking skills sharp.   ? Time 4   ? Period Weeks   ? Status Achieved   ? ?  ?  ? ?  ? ? ? ? OT Long Term Goals - 02/07/22 1255   ? ?  ? OT LONG TERM GOAL #1  ? Title Pt will verbalize understanding of ways to prevent future PD related complications and PD community resources.   ? Time 12   ? Period Weeks   ? Status  On-going   ? Target Date 03/31/22   ?  ? OT LONG TERM GOAL #2  ? Title Pt will demonstrate improved ease with feeding as evidenced by decreasing PPT#2 (self feeding) by 3 secs   ? Baseline 14.19 secs   ? Time 12   ? Period Weeks   ? Status On-going   ?  ? OT LONG TERM GOAL #3  ? Title Pt will retrieve an item at 130 shoulder flexion and -20 elbow extension with RUE.   ? Baseline 125, -25   ? Time 12   ? Period Weeks   ? Status On-going   ?  ? OT LONG TERM GOAL #4  ? Title Pt will demonstrate ability to write a short paragraph with 100% legibility and no significant decrease in letter size.   ? Time 12   ? Period Weeks   ? Status On-going   ? ?  ?  ? ?  ? ? ? ? ? ? ? ? Plan - 02/07/22 1530   ? ? Clinical Impression Statement Pt is progressing towards goals. He demonstrates understanding of PWR! moves standing with modifications at countertop.   ? OT Occupational Profile and History Detailed Assessment- Review of Records and additional review of physical, cognitive, psychosocial history related to current functional performance   ? Occupational performance deficits (Please refer to evaluation for details): ADL's;IADL's;Leisure;Play;Social Participation   ? Body Structure / Function / Physical Skills ADL;Balance;Endurance;Mobility;Strength;Flexibility;UE functional use;FMC;Gait;Coordination;ROM;GMC;Decreased knowledge of precautions;Decreased knowledge of use of DME;IADL;Dexterity   ? Rehab Potential Good   ? Clinical Decision Making Limited treatment options, no task modification necessary   ? Comorbidities Affecting Occupational Performance: May have comorbidities impacting occupational performance   ? Modification or Assistance to Complete Evaluation  No modification of tasks or assist necessary to complete eval   ? OT Frequency 2x / week   plus eval, POC written for 12 weeks anticipate d/c after 6-8 weeks  ? OT Duration 12 weeks   ? OT Treatment/Interventions Self-care/ADL training;Therapeutic exercise;Functional  Mobility Training;Balance training;Aquatic Therapy;Ultrasound;Neuromuscular education;Splinting;Manual Therapy;Therapeutic activities;DME and/or AE instruction;Cognitive remediation/compensation;Gait Training;Moist Heat;Fluidtherapy;Paraffin;Cryotherapy;Passive range of motion;Patient/family education   ? Plan  Modified quadraped PWR!,(Pt was issued handout but needs additional review), exercise flow sheet, functional reach/ upright posture, consider exercise flow sheet  ? Consulted and Agree with Plan of Care Patient   ? ?  ?  ? ?  ? ? ?Patient will benefit from skilled therapeutic intervention in order to improve the following deficits and impairments:   ?Body Structure / Function / Physical Skills: ADL, Balance, Endurance, Mobility, Strength, Flexibility, UE functional use, FMC, Gait, Coordination, ROM, GMC, Decreased knowledge of precautions, Decreased knowledge of use of DME, IADL, Dexterity ?  ?  ? ? ?Visit Diagnosis: ?Other symptoms and signs involving the nervous system ? ?Other lack of coordination ? ?Abnormal posture ? ?Unsteadiness on feet ? ?Other abnormalities of gait and mobility ? ? ? ?Problem List ?Patient Active Problem List  ? Diagnosis Date Noted  ? 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 Aortic atherosclerosis ([place]) 02/01/2022  ? Irregular heart rate 01/12/2022  ? OSA (obstructive sleep apnea) 01/12/2022  ? Parkinson's disease ([place]) 11/25/2020  ? Edema of right lower leg 11/25/2020  ? Hyperlipidemia LDL goal <70 11/25/2020  ? 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 Hearing loss 11/25/2020  ? Pityriasis rosea 11/25/2020  ? ? ?Jeryn Bertoni, OT ?02/07/2022, 3:34 PM ? ?[facility] ?[address] ?[place], [place], [address] ?Phone: [phone]   Fax:  [phone] ? ?Name: [name] ?MRN: [id number] ?Date of Birth: August 02, 1946 ? ?

## 2022-02-07 NOTE — Therapy (Signed)
?OUTPATIENT PHYSICAL THERAPY TREATMENT NOTE- 10th VISIT PROGRESS NOTE ? ? ?Patient Name: VERSHAWN WESTRUP ?MRN: [id number] ?DOB:11-25-1945, 76 y.o., male ?Today's Date: 02/07/2022 ? ?PCP: Lesleigh Noe, MD ?REFERRING PROVIDER: Star Age, MD  ? ?Physical Therapy Progress Note ?  ?Dates of Reporting Period:01/05/22- 02/07/22 ? ?See Note below for Objective Data and Assessment of Progress/Goals. ? ?Thank you for the referral of this patient. ?Francena Hanly, PT, DPT ? ? PT End of Session - 02/07/22 1324   ? ? Visit Number 10   ? Number of Visits 17   Plus eval  ? Date for PT Re-Evaluation 03/30/22   ? Authorization Type [employer]   ? Authorization Time Period 01/05/22 - 03/30/22   ? Progress Note Due on Visit 10   ? PT Start Time 0347   ? PT Stop Time 1402   ? PT Time Calculation (min) 40 min   ? Equipment Utilized During Treatment Gait belt   ? Activity Tolerance Patient tolerated treatment well   ? Behavior During Therapy [facility] for tasks assessed/performed   ? ?  ?  ? ?  ? ? ? ?Past Medical History:  ?Diagnosis Date  ? Agatston coronary artery calcium score greater than 400   ? Coronary calcium score of 533. This was 69th percentile for age,  ? Aortic atherosclerosis ([place])   ? Hyperlipidemia LDL goal <70   ? Parkinson disease ([place])   ? ?Past Surgical History:  ?Procedure Laterality Date  ? TONSILLECTOMY    ? ?Patient Active Problem List  ? Diagnosis Date Noted  ? 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 Aortic atherosclerosis ([place]) 02/01/2022  ? Irregular heart rate 01/12/2022  ? OSA (obstructive sleep apnea) 01/12/2022  ? Parkinson's disease ([place]) 11/25/2020  ? Edema of right lower leg 11/25/2020  ? Hyperlipidemia LDL goal <70 11/25/2020  ? 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 Hearing loss 11/25/2020  ? Pityriasis rosea 11/25/2020  ? ? ?REFERRING DIAG: G20 (ICD-10-CM) - Parkinson's disease ([place])  ? ?THERAPY DIAG:  ?Abnormal posture ? ?Unsteadiness on feet ? ?Other abnormalities of  gait and mobility ? ?PERTINENT HISTORY: HLD, cellulitis, lymphedema, low back pain and hearing loss. ? ?PRECAUTIONS: Fall ? ?SUBJECTIVE: No new changes. Exercises are still challenging. Pt reports he has been walking outside more - both with and without RW (for short distances) and emphasizing maintaining good posture.  ? ?PAIN:  ?Are you having pain? No ? ? ?TODAY'S TREATMENT:  ?Gait ?The following treadmill training was completed for aerobic/neural priming, endurance, and gait speed.  ?- Warmup: 2:00 up to 2.0 mph ?- HIIT: 4:00 30sec ON/OFF alternating ball kicks / walking at 2.0 mph   ?-Cool down: 60 seconds at 2.0 mph, 30 seconds at 1.0 mph ? ? ?NMR  ?Boxing w/2 therapists for improved UE/LE coordination, set-switching, dual tasking and reactive stepping. Min guard provided by second therapist throughout:  ?-Blocked practice of fwd step w/contralateral cross-body punch, x5 per side ?-Progressed to alternating fwd step w/contralateral cross-body punch w/single target placed in front of pt to provide cue for proper leg to step with. Pt demonstrated occasional step w/ipsilateral punch but pt able to identify errors without verbal cues ?-Progressed to having two targets in front of pt, noted significant errors in beginning which reduced w/added practiced.  ?-Added ipsilateral uppercut to sequence listed above  ?-Added naming a food w/each punch, which significantly reduced cadence of pt's punches and noted increase in errors of sequence.  ? ? ?  Alt fwd lunge w/2kg OH slam ball for improved thoracic rotation, stepping strategies, set-switching and dynamic balance. Pt performed x8 reps per side with min A for steadying assist. Pt required max concurrent verbal cues initially for sequencing and progressed to intermittent verbal cues. Pt frequently forgot to switch legs with each repetition but improved his ability to reach [facility] and catch the ball after each slam. Noted improved step clearance w/posterior step with more  practice.  ? ? ?PATIENT EDUCATION: ?Education details: Improvement of stepping strategies, safety with gait in the community, mixing up order of HEP to identify changes in difficulty  ?Person educated: Patient ?Education method: Explanation and handout  ?Education comprehension: verbalized understanding ? ? ?HOME EXERCISE PROGRAM: ?Access Code: [email or web address] ?URL: [email or web address] ?Date: 01/26/2022 ?Prepared by: Mickie Bail Shalissa Easterwood ? ?Exercises ?Wall push up with strong push away - 1 x daily - 7 x weekly - 3 sets - 10 reps ?Quadruped Thoracic Rotation - Reach Under - 1 x daily - 7 x weekly - 3 sets - 10 reps ?Half-Kneeling Forward Lean - 1 x daily - 7 x weekly - 3 sets - 10 reps ?Weighted Ab Twist - 1 x daily - 7 x weekly - 3 sets - 10 reps ?Quadruped rock back - 1 x daily - 7 x weekly - 3 sets - 10 reps ?Modified plank jacks with wall - 1 x daily - 7 x weekly - 3 sets - 10 reps ? ? ? ? PT Short Term Goals - 01/06/22 0806   ? ?  ? PT SHORT TERM GOAL #1  ? Title Pt will be independent with initial HEP with wife's supervision in order to build upon functional gains made in therapy. ALL STGS DUE 02/03/22   ? Time 4   ? Period Weeks   ? Status Achieved   ? Target Date 02/03/22   ?  ? PT SHORT TERM GOAL #2  ? Title Pt will maintain gait speed of 2.8 ft/sec with LRAD with supervision in order to demo improved community ambulation.   ? Baseline 2.77 ft/sec with no AD - min guard; 3.3 ft/s without RW and 3.23 ft/s with AD  ? Time 4   ? Period Weeks   ? Status Achieved   ?  ? PT SHORT TERM GOAL #3  ? Title Pt will improve 5x sit <> stand with no UE support to 19 seconds or less in order to demo improved functional strength/decr fall risk.   ? Baseline 22.97 seconds, 12.5s on 3/21    ? Time 4   ? Period Weeks   ? Status Achieved   ?  ? PT SHORT TERM GOAL #4  ? Title Pt will ambulate at least 230' over level indoor surfaces with LRAD with supervision for improved safety with mobility.   ? Baseline no AD- needs min  guard and min A for balance at times; no AD, CGA   ? Time 4   ? Period Weeks   ? Status Acheived   ?  ? PT SHORT TERM GOAL #5  ? Title Pt and pt's wife will verbalize understanding of fall prevention in the home.   ? Time 4   ? Period Weeks   ? Status Achieved   ? ?  ?  ? ?  ? ? ? PT Long Term Goals - 01/06/22 0818   ? ?  ? PT LONG TERM GOAL #1  ? Title Pt will be independent with final HEP with wife's supervision in  order to build upon functional gains made in therapy. ALL LTGS DUE 03/03/22   ? Time 8   ? Period Weeks   ? Status New   ? Target Date 03/03/22   ?  ? PT LONG TERM GOAL #2  ? Title Pt will improve miniBEST to at least a 18/28 in order to demo decr fall risk.   ? Baseline 12/28   ? Time 8   ? Period Weeks   ? Status New   ?  ? PT LONG TERM GOAL #3  ? Title Pt will improve 5x sit <> stand with no UE support to 15 seconds or less in order to demo improved functional strength/decr fall risk.   ? Baseline 22.97 seconds   ? Time 8   ? Period Weeks   ? Status New   ?  ? PT LONG TERM GOAL #4  ? Title Pt will ambulate at least 400' over outdoor paved surfaces with LRAD with mod I in order to demo improved community mobility.   ? Time 8   ? Period Weeks   ? Status New   ?  ? PT LONG TERM GOAL #5  ? Title Pt will verbalize understanding of local Parkinson's disease resources,   ? Time 8   ? Period Weeks   ? Status New   ?  ? Additional Long Term Goals  ? Additional Long Term Goals Yes   ?  ? PT LONG TERM GOAL #6  ? Title Pt will recover posterior and anterior balance in push and release test in 2 steps independently, for improved balance recovery   ? Time 8   ? Period Weeks   ? Status New   ? ?  ?  ? ?  ? ? ? Plan - 01/26/22 1458   ? ? Clinical Impression Statement Emphasis of skilled PT session on dual-tasking, stepping strategies and UE/LE coordination. Pt has greatly improved with stepping strategies and demonstrates ability to regain balance in anterior and posterior directions w/CGA. Pt continues to verbalize  difficulty w/quadruped HEP due to poor weightshifting ability and kyphotic posture. Continue POC.   ? Personal Factors and Comorbidities Age;Fitness;Past/Current Experience;Comorbidity 1;Time since onset of i

## 2022-02-09 ENCOUNTER — Ambulatory Visit: Payer: Medicare HMO | Admitting: Physical Therapy

## 2022-02-09 ENCOUNTER — Ambulatory Visit: Payer: Medicare HMO | Admitting: Speech Pathology

## 2022-02-09 DIAGNOSIS — R29818 Other symptoms and signs involving the nervous system: Secondary | ICD-10-CM | POA: Diagnosis not present

## 2022-02-09 DIAGNOSIS — R471 Dysarthria and anarthria: Secondary | ICD-10-CM | POA: Diagnosis not present

## 2022-02-09 DIAGNOSIS — R293 Abnormal posture: Secondary | ICD-10-CM

## 2022-02-09 DIAGNOSIS — R2689 Other abnormalities of gait and mobility: Secondary | ICD-10-CM

## 2022-02-09 DIAGNOSIS — R2681 Unsteadiness on feet: Secondary | ICD-10-CM

## 2022-02-09 DIAGNOSIS — R278 Other lack of coordination: Secondary | ICD-10-CM | POA: Diagnosis not present

## 2022-02-09 NOTE — Therapy (Signed)
?OUTPATIENT PHYSICAL THERAPY TREATMENT NOTE ? ? ?Patient Name: [name] ?MRN: [id number] ?DOB:1946-11-12, 76 y.o., male ?Today's Date: 02/09/2022 ? ?PCP: Lesleigh Noe, MD ?REFERRING PROVIDER: Star Age, MD  ? ? PT End of Session - 02/09/22 1320   ? ? Visit Number 11   ? Number of Visits 17   Plus eval  ? Date for PT Re-Evaluation 03/30/22   ? Authorization Type [employer]   ? Authorization Time Period 01/05/22 - 03/30/22   ? Progress Note Due on Visit 10   ? PT Start Time 2595   ? PT Stop Time 1401   ? PT Time Calculation (min) 44 min   ? Equipment Utilized During Treatment Gait belt   ? Activity Tolerance Patient tolerated treatment well   ? Behavior During Therapy [facility] for tasks assessed/performed   ? ?  ?  ? ?  ? ? ? ?Past Medical History:  ?Diagnosis Date  ? Agatston coronary artery calcium score greater than 400   ? Coronary calcium score of 533. This was 69th percentile for age,  ? Aortic atherosclerosis ([place])   ? Hyperlipidemia LDL goal <70   ? Parkinson disease ([place])   ? ?Past Surgical History:  ?Procedure Laterality Date  ? TONSILLECTOMY    ? ?Patient Active Problem List  ? Diagnosis Date Noted  ? 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 Aortic atherosclerosis ([place]) 02/01/2022  ? Irregular heart rate 01/12/2022  ? OSA (obstructive sleep apnea) 01/12/2022  ? Parkinson's disease ([place]) 11/25/2020  ? Edema of right lower leg 11/25/2020  ? Hyperlipidemia LDL goal <70 11/25/2020  ? 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 Hearing loss 11/25/2020  ? Pityriasis rosea 11/25/2020  ? ? ?REFERRING DIAG: G20 (ICD-10-CM) - Parkinson's disease ([place])  ? ?THERAPY DIAG:  ?Unsteadiness on feet ? ?Other abnormalities of gait and mobility ? ?Abnormal posture ? ?PERTINENT HISTORY: HLD, cellulitis, lymphedema, low back pain and hearing loss. ? ?PRECAUTIONS: Fall ? ?SUBJECTIVE: No new changes. Reports changing the order of his HEP helped with floor transfer.  ? ?PAIN:  ?Are you having pain?  No ? ? ?TODAY'S TREATMENT:  ?Gait ?The following treadmill training was completed for aerobic/neural priming, endurance, and gait speed.  ?- Warmup: 2:00 up to 2.0 mph ?- HIIT: 4:00 30sec ON/OFF alternating 2.0 mph on 0% grade and 3% grade  ?-Cooldown: 2:00 from 2.0 to 1.0 mph  ? ? ?NMR  ?"Whack-A-Mole" style dot drill with 6 different colored dots and rapid fire call-out of which dot to tap for improved reactive stepping, lateral weight shifting and postural control. Min guard throughout for anterior LOB. Noted increased forward lean as pt fatigued. Changed design of dots on ground throughout for improved anterior and lateral reaching.  ? ?In grass with 2 therapists, resisted fwd walking on uneven surface with random posterolateral perturbations for reactive stepping strategy practice and ankle strategy. CGA- min guard throughout for steadying assist, one LOB noted to R side due to crossover step. Pt ambulated ~300' in grass.  ? ?Gait pattern: step through pattern, trunk flexed, poor foot clearance- Right, and poor foot clearance- Left ?Distance walked: >200' ?Assistive device utilized: Environmental consultant - 2 wheeled ?Level of assistance: CGA ?Comments: Ambulated outside on sidewalk, multiple anterior LOB episodes due to walker catching on cracks in sidewalk requiring min A for steadying assist.  ? ? ? ?PATIENT EDUCATION: ?Education details: RW vs Rollator for gait  ?Person educated: Patient ?Education method: Explanation  ?Education  comprehension: verbalized understanding ? ? ?HOME EXERCISE PROGRAM: ?Access Code: [email or web address] ?URL: [email or web address] ?Date: 01/26/2022 ?Prepared by: Mickie Bail Danialle Dement ? ?Exercises ?Wall push up with strong push away - 1 x daily - 7 x weekly - 3 sets - 10 reps ?Quadruped Thoracic Rotation - Reach Under - 1 x daily - 7 x weekly - 3 sets - 10 reps ?Half-Kneeling Forward Lean - 1 x daily - 7 x weekly - 3 sets - 10 reps ?Weighted Ab Twist - 1 x daily - 7 x weekly - 3 sets - 10  reps ?Quadruped rock back - 1 x daily - 7 x weekly - 3 sets - 10 reps ?Modified plank jacks with wall - 1 x daily - 7 x weekly - 3 sets - 10 reps ? ? ? ? PT Short Term Goals - 01/06/22 0806   ? ?  ? PT SHORT TERM GOAL #1  ? Title Pt will be independent with initial HEP with wife's supervision in order to build upon functional gains made in therapy. ALL STGS DUE 02/03/22   ? Time 4   ? Period Weeks   ? Status Achieved   ? Target Date 02/03/22   ?  ? PT SHORT TERM GOAL #2  ? Title Pt will maintain gait speed of 2.8 ft/sec with LRAD with supervision in order to demo improved community ambulation.   ? Baseline 2.77 ft/sec with no AD - min guard; 3.3 ft/s without RW and 3.23 ft/s with AD  ? Time 4   ? Period Weeks   ? Status Achieved   ?  ? PT SHORT TERM GOAL #3  ? Title Pt will improve 5x sit <> stand with no UE support to 19 seconds or less in order to demo improved functional strength/decr fall risk.   ? Baseline 22.97 seconds, 12.5s on 3/21    ? Time 4   ? Period Weeks   ? Status Achieved   ?  ? PT SHORT TERM GOAL #4  ? Title Pt will ambulate at least 230' over level indoor surfaces with LRAD with supervision for improved safety with mobility.   ? Baseline no AD- needs min guard and min A for balance at times; no AD, CGA   ? Time 4   ? Period Weeks   ? Status Acheived   ?  ? PT SHORT TERM GOAL #5  ? Title Pt and pt's wife will verbalize understanding of fall prevention in the home.   ? Time 4   ? Period Weeks   ? Status Achieved   ? ?  ?  ? ?  ? ? ? PT Long Term Goals - 01/06/22 0818   ? ?  ? PT LONG TERM GOAL #1  ? Title Pt will be independent with final HEP with wife's supervision in order to build upon functional gains made in therapy. ALL LTGS DUE 03/03/22   ? Time 8   ? Period Weeks   ? Status New   ? Target Date 03/03/22   ?  ? PT LONG TERM GOAL #2  ? Title Pt will improve miniBEST to at least a 18/28 in order to demo decr fall risk.   ? Baseline 12/28   ? Time 8   ? Period Weeks   ? Status New   ?  ? PT LONG  TERM GOAL #3  ? Title Pt will improve 5x sit <> stand with no UE support to 15 seconds or less in order  to demo improved functional strength/decr fall risk.   ? Baseline 22.97 seconds   ? Time 8   ? Period Weeks   ? Status New   ?  ? PT LONG TERM GOAL #4  ? Title Pt will ambulate at least 400' over outdoor paved surfaces with LRAD with mod I in order to demo improved community mobility.   ? Time 8   ? Period Weeks   ? Status New   ?  ? PT LONG TERM GOAL #5  ? Title Pt will verbalize understanding of local Parkinson's disease resources,   ? Time 8   ? Period Weeks   ? Status New   ?  ? Additional Long Term Goals  ? Additional Long Term Goals Yes   ?  ? PT LONG TERM GOAL #6  ? Title Pt will recover posterior and anterior balance in push and release test in 2 steps independently, for improved balance recovery   ? Time 8   ? Period Weeks   ? Status New   ? ?  ?  ? ?  ? ? ? Plan - 01/26/22 1458   ? ? Clinical Impression Statement Emphasis of skilled PT session on stepping strategies, improved step length and lateral weight shifting. Pt demonstrates improvement with speed of lateral weight shifting and reactive stepping strategies. Noted significant anterior LOB on outdoor surfaces due to RW catching in cracks on sidewalk, plan to trial use of rollator next session due to pt's wife owning one already. Continue POC.   ? Personal Factors and Comorbidities Age;Fitness;Past/Current Experience;Comorbidity 1;Time since onset of injury/illness/exacerbation   ? Comorbidities Cellulitis   ? Examination-Activity Limitations Bend;Carry;Reach Overhead;Locomotion Level;Lift;Stairs;Squat;Stand;Transfers   ? Examination-Participation Restrictions Community Activity   ? Stability/Clinical Decision Making Evolving/Moderate complexity   ? Rehab Potential Good   ? PT Frequency 2x / week   ? PT Duration 12 weeks   ? PT Treatment/Interventions ADLs/Self Care Home Management;Gait training;DME Instruction;Stair training;Functional mobility  training;Therapeutic activities;Therapeutic exercise;Balance training;Neuromuscular re-education;Cognitive remediation;Patient/family education;Energy conservation;Vestibular   ? PT Next Visit Plan How is new HEP? TM tr

## 2022-02-13 ENCOUNTER — Other Ambulatory Visit: Payer: Self-pay | Admitting: Neurology

## 2022-02-13 ENCOUNTER — Other Ambulatory Visit: Payer: Self-pay | Admitting: Family Medicine

## 2022-02-14 ENCOUNTER — Ambulatory Visit: Payer: Medicare HMO | Admitting: Speech Pathology

## 2022-02-14 ENCOUNTER — Ambulatory Visit: Payer: Medicare HMO | Admitting: Occupational Therapy

## 2022-02-14 ENCOUNTER — Ambulatory Visit: Payer: Medicare HMO | Attending: Family Medicine | Admitting: Physical Therapy

## 2022-02-14 DIAGNOSIS — R293 Abnormal posture: Secondary | ICD-10-CM

## 2022-02-14 DIAGNOSIS — M6281 Muscle weakness (generalized): Secondary | ICD-10-CM | POA: Diagnosis not present

## 2022-02-14 DIAGNOSIS — R471 Dysarthria and anarthria: Secondary | ICD-10-CM | POA: Insufficient documentation

## 2022-02-14 DIAGNOSIS — R2689 Other abnormalities of gait and mobility: Secondary | ICD-10-CM

## 2022-02-14 DIAGNOSIS — R278 Other lack of coordination: Secondary | ICD-10-CM

## 2022-02-14 DIAGNOSIS — R2681 Unsteadiness on feet: Secondary | ICD-10-CM | POA: Insufficient documentation

## 2022-02-14 DIAGNOSIS — R29818 Other symptoms and signs involving the nervous system: Secondary | ICD-10-CM | POA: Insufficient documentation

## 2022-02-14 NOTE — Therapy (Signed)
[facility] ?[address] ?[place], [place], [address] ?Phone: [phone]   Fax:  [phone] ? ?Speech Language Pathology Treatment ? ?Patient Details  ?Name: [name] ?MRN: [id number] ?Date of Birth: 04/27/1946 ?Referring Provider (SLP): Dr. Rexene Alberts ? ? ?Encounter Date: 02/14/2022 ? ? End of Session - 02/14/22 1406   ? ? Visit Number 11   ? Number of Visits 25   ? Date for SLP Re-Evaluation 04/07/22   ? SLP Start Time 1400   ? SLP Stop Time  1445   ? SLP Time Calculation (min) 45 min   ? Activity Tolerance Patient tolerated treatment well   ? ?  ?  ? ?  ? ? ?Past Medical History:  ?Diagnosis Date  ? Agatston coronary artery calcium score greater than 400   ? Coronary calcium score of 533. This was 69th percentile for age,  ? Aortic atherosclerosis ([place])   ? Hyperlipidemia LDL goal <70   ? Parkinson disease ([place])   ? ? ?Past Surgical History:  ?Procedure Laterality Date  ? TONSILLECTOMY    ? ? ?There were no vitals filed for this visit. ? ? Subjective Assessment - 02/14/22 1405   ? ? Subjective "Pretty good, I didn't know how far you wanted me to go."   ? Currently in Pain? No/denies   ? ?  ?  ? ?  ? ? ? ? ? ? ? ? ADULT SLP TREATMENT - 02/14/22 0001   ? ?  ? General Information  ? Behavior/Cognition Alert;Cooperative;Pleasant mood   ?  ? Treatment Provided  ? Treatment provided Cognitive-Linquistic   ?  ? Cognitive-Linquistic Treatment  ? 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 Skilled Treatment Pt enters session with increased hoarse vocal quality, decreased loudness from baseline. Pt tells ST he does not know why. Improved quality and intensity observed during structured speech activities. Hoarseness returns with cognitive tasks and conversational speech sample. Target improving vocal quality and increasing intensity through progressively difficulty speech tasks using Speak Out! program, lesson 21. Averages this date: loud "ah" 84 dB; reading 73 dB;  cognitive speech task 70 dB. Occasional mod A required this date during structured practice.   ?  ? Assessment / Recommendations / Plan  ? Plan Continue with current plan of care   ?  ? Progression Toward Goals  ? Progression toward goals Progressing toward goals   ? ?  ?  ? ?  ? ? ? ? ? SLP Short Term Goals - 02/14/22 1408   ? ?  ? SLP SHORT TERM GOAL #1  ? Title pt will produce loud "ah" averaging 90 dB over 5 trials with rare min A across 2 sessions   ? Baseline 01/24/22; 01/31/22   ? Status Achieved   ?  ? SLP SHORT TERM GOAL #2  ? Title pt will average 70-72 dB across 5 minute conversational sample   ? Baseline 02/02/22 increased average to high 60s with usual min A   ? Status Partially Met   ?  ? SLP SHORT TERM GOAL #3  ? Title pt with average 70-72 dB at sentence level 18/20 sentences over 2 sessions   ? Baseline 01/24/22; 01/31/22   ? Status Achieved   ?  ? SLP SHORT TERM GOAL #4  ? Title pt will have 10 or less throat clears over 45 minute session   ? Baseline 01/24/22; 02/02/22   ? Status Achieved   ? ?  ?  ? ?  ? ? ?  SLP Long Term Goals - 02/14/22 1408   ? ?  ? SLP LONG TERM GOAL #1  ? Title pt will average 90 dB with x5 loud "ah" mod I over 3 sessions   ? Time 6   ? Period Weeks   ? Status On-going   ?  ? SLP LONG TERM GOAL #2  ? Title pt will average 70-72 dB over 20 minute conversation with rare min A   ? Time 6   ? Period Weeks   ? Status On-going   ?  ? SLP LONG TERM GOAL #3  ? Title pt/spouse report 50% decrease in requests for repetition subjectively at home   ? Time 6   ? Period Weeks   ? Status On-going   ?  ? SLP LONG TERM GOAL #4  ? Title pt/spouse will report compliance with vocal hygiene recommendations over 1 week period   ? Time 6   ? Period Weeks   ? Status On-going   ? ?  ?  ? ?  ? ? ? Plan - 02/14/22 1408   ? ? Clinical Impression Statement Excell Neyland has Parkinson's and presents with reduced vocal intensity/volume and hoarseness during speech.  Pt demonstrating improvements in vocal intensity  and vocal quality during structured speech tasks in therapy, requires mod A to sustain during spontanous productions. Pt's spontaneous speech averages in low 60s dB. 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   ? Speech Therapy Frequency 2x / week   ? Duration 12 weeks   ? Treatment/Interventions Functional tasks;Cueing hierarchy;Environmental controls;Patient/family education;SLP instruction and feedback;Internal/external aids;Compensatory strategies;Compensatory techniques   ? Potential to Achieve Goals Good   ? Potential Considerations Medical prognosis   ? SLP Home Exercise Plan loud "ah"; oral reading, vocal glides   ? Consulted and Agree with Plan of Care Patient;Family member/caregiver   ? Family Member Consulted wife, [name]   ? ?  ?  ? ?  ? ? ?Patient will benefit from skilled therapeutic intervention in order to improve the following deficits and impairments:   ?Dysarthria ? ? ? ?Problem List ?Patient Active Problem List  ? Diagnosis Date Noted  ? 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 Aortic atherosclerosis ([place]) 02/01/2022  ? Irregular heart rate 01/12/2022  ? OSA (obstructive sleep apnea) 01/12/2022  ? Parkinson's disease ([place]) 11/25/2020  ? Edema of right lower leg 11/25/2020  ? Hyperlipidemia LDL goal <70 11/25/2020  ? 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 Hearing loss 11/25/2020  ? Pityriasis rosea 11/25/2020  ? ? ?Su Monks, CF-SLP ?02/14/2022, 2:50 PM ? ?[address] ?[place], [place], [address] ?Phone: [phone]   Fax:  [phone] ? ? ?Name: DURAND WITTMEYER ?MRN: [id number] ?Date of Birth: Dec 04, 1945 ? ?

## 2022-02-14 NOTE — Patient Instructions (Signed)
Ways to prevent future Parkinson's related complications: ?1.   Exercise regularly,  ?2.   Focus on BIGGER movements during daily activities- really reach overhead, straighten elbows and extend fingers ?3.   When dressing or reaching for your seatbelt make sure to use your body to assist by twisting while you reach-this can help to minimize stress on the shoulder and reduce the risk of a rotator cuff tear and frozen shoulder  ? ?

## 2022-02-14 NOTE — Therapy (Signed)
?OUTPATIENT PHYSICAL THERAPY TREATMENT NOTE ? ? ?Patient Name: ZACK CRAGER ?MRN: [id number] ?DOB:13-Jun-1946, 76 y.o., male ?Today's Date: 02/14/2022 ? ?PCP: Lesleigh Noe, MD ?REFERRING PROVIDER: Star Age, MD  ? ? PT End of Session - 02/14/22 1230   ? ? Visit Number 12   ? Number of Visits 17   Plus eval  ? Date for PT Re-Evaluation 03/30/22   ? Authorization Type [employer]   ? Authorization Time Period 01/05/22 - 03/30/22   ? Progress Note Due on Visit 10   ? PT Start Time 1229   ? PT Stop Time 1318   ? PT Time Calculation (min) 49 min   ? Equipment Utilized During Treatment Gait belt   ? Activity Tolerance Patient tolerated treatment well   ? Behavior During Therapy [facility] for tasks assessed/performed   ? ?  ?  ? ?  ? ? ? ? ?Past Medical History:  ?Diagnosis Date  ? Agatston coronary artery calcium score greater than 400   ? Coronary calcium score of 533. This was 69th percentile for age,  ? Aortic atherosclerosis ([place])   ? Hyperlipidemia LDL goal <70   ? Parkinson disease ([place])   ? ?Past Surgical History:  ?Procedure Laterality Date  ? TONSILLECTOMY    ? ?Patient Active Problem List  ? Diagnosis Date Noted  ? 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 Aortic atherosclerosis ([place]) 02/01/2022  ? Irregular heart rate 01/12/2022  ? OSA (obstructive sleep apnea) 01/12/2022  ? Parkinson's disease ([place]) 11/25/2020  ? Edema of right lower leg 11/25/2020  ? Hyperlipidemia LDL goal <70 11/25/2020  ? 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 Hearing loss 11/25/2020  ? Pityriasis rosea 11/25/2020  ? ? ?REFERRING DIAG: G20 (ICD-10-CM) - Parkinson's disease ([place])  ? ?THERAPY DIAG:  ?Unsteadiness on feet ? ?Other abnormalities of gait and mobility ? ?Abnormal posture ? ?PERTINENT HISTORY: HLD, cellulitis, lymphedema, low back pain and hearing loss. ? ?PRECAUTIONS: Fall ? ?SUBJECTIVE: HEP is going about the same, no new changes  ? ?PAIN:  ?Are you having pain? No ? ? ?TODAY'S TREATMENT:  ?Gait  Training  ?The following treadmill training was completed for aerobic/neural priming, endurance, and gait speed.  ?- Warmup: 2:00 up to 2.0 mph ?- HIIT: 5:00 30sec ON/OFF alternating ball kicks and normal walking at 2.0 mph   ?-Cooldown: 2:00 from 2.0 to 1.0 mph  ? ?Gait pattern: step through pattern, Right foot flat, Left foot flat, festinating, and trunk flexed ?Distance walked: 230' ?Assistive device utilized: Environmental consultant - 4 wheeled ?Level of assistance: SBA ?Comments: Pt ambulated 2 laps around gym w/rollator to trial prior to walking outside. Min verbal cues for upright posture, management of brakes and maintaining safe distance to device. Pt demonstrated good management of AD and increased gait speed compared to RW. No LOB or shuffling noted.  ? ?Gait pattern: step through pattern, Right steppage, Left steppage, festinating, trunk flexed, and poor foot clearance- Left ?Distance walked: >300' on outdoor sidewalk  ?Assistive device utilized: Environmental consultant - 4 wheeled ?Level of assistance: SBA ?Comments: Practiced ambulating on uneven sidewalk to imitate pt's home environment. Pt demonstrates good trunk control w/rollator and demonstrated ability to lock, unlock and sit on rollator safely. No cues provided throughout. Minor festination noted around turns which pt quickly self-corrected   ? ? ?NMR  ?Walking w/scarf throw for increased arm swing, step length and reciprocal gait sequencing. Pt ambulated 50' throwing w/RUE and 72' w/LUE. Pt  demonstrated significant difficulty sequencing the arm swing/scarf throw and contralateral step, mod-max verbal and visual cues provided for sequencing. Noted pt frequently stepping w/incorrect LE when throwing w/LUE and taking multiple shuffling steps instead of large steps throughout. Min guard throughout for safety.  ? ? ? ?PATIENT EDUCATION: ?Education details: Trying rollator at home   ?Person educated: Patient ?Education method: Explanation  ?Education comprehension: verbalized  understanding ? ? ?HOME EXERCISE PROGRAM: ?Access Code: [email or web address] ?URL: [email or web address] ?Date: 01/26/2022 ?Prepared by: Mickie Bail Markiya Keefe ? ?Exercises ?Wall push up with strong push away - 1 x daily - 7 x weekly - 3 sets - 10 reps ?Quadruped Thoracic Rotation - Reach Under - 1 x daily - 7 x weekly - 3 sets - 10 reps ?Half-Kneeling Forward Lean - 1 x daily - 7 x weekly - 3 sets - 10 reps ?Weighted Ab Twist - 1 x daily - 7 x weekly - 3 sets - 10 reps ?Quadruped rock back - 1 x daily - 7 x weekly - 3 sets - 10 reps ?Modified plank jacks with wall - 1 x daily - 7 x weekly - 3 sets - 10 reps ? ? ? ? PT Short Term Goals - 01/06/22 0806   ? ?  ? PT SHORT TERM GOAL #1  ? Title Pt will be independent with initial HEP with wife's supervision in order to build upon functional gains made in therapy. ALL STGS DUE 02/03/22   ? Time 4   ? Period Weeks   ? Status Achieved   ? Target Date 02/03/22   ?  ? PT SHORT TERM GOAL #2  ? Title Pt will maintain gait speed of 2.8 ft/sec with LRAD with supervision in order to demo improved community ambulation.   ? Baseline 2.77 ft/sec with no AD - min guard; 3.3 ft/s without RW and 3.23 ft/s with AD  ? Time 4   ? Period Weeks   ? Status Achieved   ?  ? PT SHORT TERM GOAL #3  ? Title Pt will improve 5x sit <> stand with no UE support to 19 seconds or less in order to demo improved functional strength/decr fall risk.   ? Baseline 22.97 seconds, 12.5s on 3/21    ? Time 4   ? Period Weeks   ? Status Achieved   ?  ? PT SHORT TERM GOAL #4  ? Title Pt will ambulate at least 230' over level indoor surfaces with LRAD with supervision for improved safety with mobility.   ? Baseline no AD- needs min guard and min A for balance at times; no AD, CGA   ? Time 4   ? Period Weeks   ? Status Acheived   ?  ? PT SHORT TERM GOAL #5  ? Title Pt and pt's wife will verbalize understanding of fall prevention in the home.   ? Time 4   ? Period Weeks   ? Status Achieved   ? ?  ?  ? ?  ? ? ? PT Long Term  Goals - 01/06/22 0818   ? ?  ? PT LONG TERM GOAL #1  ? Title Pt will be independent with final HEP with wife's supervision in order to build upon functional gains made in therapy. ALL LTGS DUE 03/03/22   ? Time 8   ? Period Weeks   ? Status New   ? Target Date 03/03/22   ?  ? PT LONG TERM GOAL #2  ? Title Pt will  improve miniBEST to at least a 18/28 in order to demo decr fall risk.   ? Baseline 12/28   ? Time 8   ? Period Weeks   ? Status New   ?  ? PT LONG TERM GOAL #3  ? Title Pt will improve 5x sit <> stand with no UE support to 15 seconds or less in order to demo improved functional strength/decr fall risk.   ? Baseline 22.97 seconds   ? Time 8   ? Period Weeks   ? Status New   ?  ? PT LONG TERM GOAL #4  ? Title Pt will ambulate at least 400' over outdoor paved surfaces with LRAD with mod I in order to demo improved community mobility.   ? Time 8   ? Period Weeks   ? Status New   ?  ? PT LONG TERM GOAL #5  ? Title Pt will verbalize understanding of local Parkinson's disease resources,   ? Time 8   ? Period Weeks   ? Status New   ?  ? Additional Long Term Goals  ? Additional Long Term Goals Yes   ?  ? PT LONG TERM GOAL #6  ? Title Pt will recover posterior and anterior balance in push and release test in 2 steps independently, for improved balance recovery   ? Time 8   ? Period Weeks   ? Status New   ? ?  ?  ? ?  ? ? ? Plan - 01/26/22 1458   ? ? Clinical Impression Statement Emphasis of skilled PT session on gait training w/rollator on even and uneven surfaces and improved arm swing/ reciprocal coordination. Pt demonstrates good control of rollator, especially outdoors on unlevel sidewalk. Pt encouraged to try rollator at home for improved community mobility and balance. Pt continues to demonstrate difficulty with UE/LE coordination, especially on L side, requiring mod-max cues for assistance. Continue POC.   ? Personal Factors and Comorbidities Age;Fitness;Past/Current Experience;Comorbidity 1;Time since onset of  injury/illness/exacerbation   ? Comorbidities Cellulitis   ? Examination-Activity Limitations Bend;Carry;Reach Overhead;Locomotion Level;Lift;Stairs;Squat;Stand;Transfers   ? Examination-Participation Restrict

## 2022-02-15 NOTE — Therapy (Signed)
[facility] ?[place], [place], [address] ?Phone: [phone]   Fax:  [phone] ? ?Occupational Therapy Treatment ? ?Patient Details  ?Name: [name] ?MRN: [id number] ?Date of Birth: May 03, 1946 ?Referring Provider (OT): Dr. Rexene Alberts ? ? ?Encounter Date: 02/14/2022 ? ? OT End of Session - 02/14/22 1336   ? ? Visit Number 10   ? Number of Visits 25   ? Date for OT Re-Evaluation 03/31/22   ? Authorization Type [employer]   ? Authorization Time Period 60 days- POC written for 12 weeks anticiapte d/c following 6-8 weeks   ? Authorization - Visit Number 10   ? Authorization - Number of Visits 10   ? Progress Note Due on Visit 10   ? OT Start Time 1320   ? OT Stop Time 1400   ? OT Time Calculation (min) 40 min   ? Activity Tolerance Patient tolerated treatment well   ? Behavior During Therapy [facility] for tasks assessed/performed   ? ?  ?  ? ?  ? ? ?Past Medical History:  ?Diagnosis Date  ? Agatston coronary artery calcium score greater than 400   ? Coronary calcium score of 533. This was 69th percentile for age,  ? Aortic atherosclerosis ([place])   ? Hyperlipidemia LDL goal <70   ? Parkinson disease ([place])   ? ? ?Past Surgical History:  ?Procedure Laterality Date  ? TONSILLECTOMY    ? ? ?There were no vitals filed for this visit. ? ? Subjective Assessment - 02/14/22 1320   ? ? Subjective  Denies pain   ? Pertinent History PD diganosed in 2019   cellulitis, lymphedema, low back pain, hyperlipidemia,   ? Currently in Pain? No/denies   ? ?  ?  ? ?  ? ? ? ? ? ? ? ? ? ? ? ? ? ?Treatment: Dynamic step and reach to each side to flip large playing cards  with left and right UE's, min v.c  ? ? ? ? ? ? ? ? ? OT Education - 02/14/22 1337   ? ? Education Details Therapist performed education regarding modified quadraped PWR! rock, twist and step, pt returned demosntration with min v.c, handout issued. Education provided regarding preventing future PD complications,  community resources, then handwriting strategies review.   ? Person(s) Educated Patient   ? Methods Explanation;Demonstration;Verbal cues;Handout   ? Comprehension Verbalized understanding;Returned demonstration;Verbal cues required   ? ?  ?  ? ?  ? ? ? OT Short Term Goals - 02/14/22 1332   ? ?  ? OT SHORT TERM GOAL #1  ? Title I with PD specific HEP   ? Time 4   ? Period Weeks   ? Status Achieved   ? Target Date 02/03/22   ?  ? OT SHORT TERM GOAL #2  ? Title Pt will verbalize understanding of adapted strategies to maximize safety and I with ADLs/ IADLs .   ? Time 4   ? Period Weeks   ? Status On-going   needs reinforcement  ?  ? OT SHORT TERM GOAL #3  ? Title Pt will demonstrate improved fine motor coordination for ADLs as evidenced by decreasing 9 hole peg test score for RUE by 3 secs   ? Baseline R 38.25, L 33.75   ? Time 4   ? Period Weeks   ? Status Achieved   01/31/22:  R-27.62sec, L-29.53sec  ?  ? OT SHORT TERM GOAL #4  ?  Title Pt will demonstrated improved LUE functional use as evidenced by increasing box/ blocks by 3 blocks   ? Baseline R 45, L 39   ? Time 4   ? Period Weeks   ? Status Achieved   01/31/22:  R-46 blocks, L-43 blocks  ?  ? OT SHORT TERM GOAL #5  ? Title Pt will verbalize understanding of ways to keep thinking skills sharp.   ? Time 4   ? Period Weeks   ? Status Achieved   ? ?  ?  ? ?  ? ? ? ? OT Long Term Goals - 02/14/22 1333   ? ?  ? OT LONG TERM GOAL #1  ? Title Pt will verbalize understanding of ways to prevent future PD related complications and PD community resources.   ? Time 12   ? Period Weeks   ? Status On-going   ? Target Date 03/31/22   ?  ? OT LONG TERM GOAL #2  ? Title Pt will demonstrate improved ease with feeding as evidenced by decreasing PPT#2 (self feeding) by 3 secs   ? Baseline 14.19 secs   ? Time 12   ? Period Weeks   ? Status On-going   ?  ? OT LONG TERM GOAL #3  ? Title Pt will retrieve an item at 130 shoulder flexion and -20 elbow extension with RUE.   ? Baseline 125,  -25   ? Time 12   ? Period Weeks   ? Status On-going   ?  ? OT LONG TERM GOAL #4  ? Title Pt will demonstrate ability to write a short paragraph with 100% legibility and no significant decrease in letter size.   ? Time 12   ? Period Weeks   ? Status Achieved   ? ?  ?  ? ?  ? ? ? ? ? ? ? ? Plan - 02/15/22 1521   ? ? Clinical Impression Statement For the reporting period of 01/06/22-02/14/22 pt demonstrates progress toward goals. Pt has met 4/5 short term goals and he demonstrates progress towards remaining goals.Pt demonstrates the following deficits: decreased coordination  abnormal posture, decreased balance, decreased functional mobility, bradykinesia which impedes performance of ADLS/IADLs. Pt can benefit from continued skilled occupational therapy to address these deficits.   ? OT Occupational Profile and History Detailed Assessment- Review of Records and additional review of physical, cognitive, psychosocial history related to current functional performance   ? Occupational performance deficits (Please refer to evaluation for details): ADL's;IADL's;Leisure;Play;Social Participation   ? Body Structure / Function / Physical Skills ADL;Balance;Endurance;Mobility;Strength;Flexibility;UE functional use;FMC;Gait;Coordination;ROM;GMC;Decreased knowledge of precautions;Decreased knowledge of use of DME;IADL;Dexterity   ? Rehab Potential Good   ? Clinical Decision Making Limited treatment options, no task modification necessary   ? Comorbidities Affecting Occupational Performance: May have comorbidities impacting occupational performance   ? Modification or Assistance to Complete Evaluation  No modification of tasks or assist necessary to complete eval   ? OT Frequency 2x / week   plus eval, POC written for 12 weeks anticipate d/c after 6-8 weeks  ? OT Duration 12 weeks   ? OT Treatment/Interventions Self-care/ADL training;Therapeutic exercise;Functional Mobility Training;Balance training;Aquatic  Therapy;Ultrasound;Neuromuscular education;Splinting;Manual Therapy;Therapeutic activities;DME and/or AE instruction;Cognitive remediation/compensation;Gait Training;Moist Heat;Fluidtherapy;Paraffin;Cryotherapy;Passive range of motion;Patient/family education   ? Plan continue ADL strategies, consider PWR! moves modified quadraped, dynamic functional reaching   ? Consulted and Agree with Plan of Care Patient   ? ?  ?  ? ?  ? ? ?Patient will benefit  from skilled therapeutic intervention in order to improve the following deficits and impairments:   ?Body Structure / Function / Physical Skills: ADL, Balance, Endurance, Mobility, Strength, Flexibility, UE functional use, FMC, Gait, Coordination, ROM, GMC, Decreased knowledge of precautions, Decreased knowledge of use of DME, IADL, Dexterity ?  ?  ? ? ?Visit Diagnosis: ?Abnormal posture ? ?Other lack of coordination ? ?Other symptoms and signs involving the nervous system ? ?Other abnormalities of gait and mobility ? ?Unsteadiness on feet ? ? ? ?Problem List ?Patient Active Problem List  ? Diagnosis Date Noted  ? 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 Aortic atherosclerosis ([place]) 02/01/2022  ? Irregular heart rate 01/12/2022  ? OSA (obstructive sleep apnea) 01/12/2022  ? Parkinson's disease ([place]) 11/25/2020  ? Edema of right lower leg 11/25/2020  ? Hyperlipidemia LDL goal <70 11/25/2020  ? 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 Hearing loss 11/25/2020  ? Pityriasis rosea 11/25/2020  ? ? ?Plez Belton, OT ?02/15/2022, 3:26 PM ? ?[place] ?[facility] ?[place], [place], [address] ?Phone: [phone]   Fax:  [phone] ? ?Name: [name] ?MRN: [id number] ?Date of Birth: 05-08-1946 ? ?

## 2022-02-16 ENCOUNTER — Ambulatory Visit: Payer: Medicare HMO | Admitting: Speech Pathology

## 2022-02-16 ENCOUNTER — Ambulatory Visit: Payer: Medicare HMO | Admitting: Physical Therapy

## 2022-02-16 ENCOUNTER — Ambulatory Visit: Payer: Medicare HMO | Admitting: Occupational Therapy

## 2022-02-16 ENCOUNTER — Encounter: Payer: Self-pay | Admitting: Occupational Therapy

## 2022-02-16 DIAGNOSIS — R293 Abnormal posture: Secondary | ICD-10-CM | POA: Diagnosis not present

## 2022-02-16 DIAGNOSIS — R2689 Other abnormalities of gait and mobility: Secondary | ICD-10-CM

## 2022-02-16 DIAGNOSIS — R29818 Other symptoms and signs involving the nervous system: Secondary | ICD-10-CM | POA: Diagnosis not present

## 2022-02-16 DIAGNOSIS — R278 Other lack of coordination: Secondary | ICD-10-CM

## 2022-02-16 DIAGNOSIS — M6281 Muscle weakness (generalized): Secondary | ICD-10-CM | POA: Diagnosis not present

## 2022-02-16 DIAGNOSIS — R471 Dysarthria and anarthria: Secondary | ICD-10-CM | POA: Diagnosis not present

## 2022-02-16 DIAGNOSIS — R2681 Unsteadiness on feet: Secondary | ICD-10-CM

## 2022-02-16 NOTE — Therapy (Signed)
[place] ?[facility] ?[place], [place], [address] ?Phone: [phone]   Fax:  [phone] ? ?Speech Language Pathology Treatment ? ?Patient Details  ?Name: [name] ?MRN: [id number] ?Date of Birth: 01-26-1946 ?Referring Provider (SLP): Dr. Rexene Alberts ? ? ?Encounter Date: 02/16/2022 ? ? End of Session - 02/16/22 1220   ? ? Visit Number 12   ? Number of Visits 25   ? Date for SLP Re-Evaluation 04/07/22   ? SLP Start Time 1230   ? SLP Stop Time  1315   ? SLP Time Calculation (min) 45 min   ? Activity Tolerance Patient tolerated treatment well   ? ?  ?  ? ?  ? ? ?Past Medical History:  ?Diagnosis Date  ? Agatston coronary artery calcium score greater than 400   ? Coronary calcium score of 533. This was 69th percentile for age,  ? Aortic atherosclerosis ([place])   ? Hyperlipidemia LDL goal <70   ? Parkinson disease ([place])   ? ? ?Past Surgical History:  ?Procedure Laterality Date  ? TONSILLECTOMY    ? ? ?There were no vitals filed for this visit. ? ? Subjective Assessment - 02/16/22 1221   ? ? Subjective "My exercises have been going well"   ? Currently in Pain? No/denies   ? ?  ?  ? ?  ? ? ? ? ? ? ? ? ADULT SLP TREATMENT - 02/16/22 0001   ? ?  ? General Information  ? Behavior/Cognition Alert;Cooperative;Pleasant mood   ?  ? Treatment Provided  ? Treatment provided Cognitive-Linquistic   ?  ? Cognitive-Linquistic Treatment  ? 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 Skilled Treatment Led pt through speak out lesson 23 this date. Rare min-A required during structured tasks in meeting dB goals. Averages as follows: 84 dB loud ahs, 74 dB reading, 69 dB cognitive task. During conversational speech sample, pt averaging 66 dB with uusal min-A with initial instruction to use intentional speech. Continues to present with frequent throat clears, x16 observed across 45 minute session. Usual verbal cues to use alternative technique to mitigate throat clears.   ?  ?  Assessment / Recommendations / Plan  ? Plan Continue with current plan of care   ?  ? Progression Toward Goals  ? Progression toward goals Progressing toward goals   ? ?  ?  ? ?  ? ? ? ? ? SLP Short Term Goals - 02/16/22 1220   ? ?  ? SLP SHORT TERM GOAL #1  ? Title pt will produce loud "ah" averaging 90 dB over 5 trials with rare min A across 2 sessions   ? Baseline 01/24/22; 01/31/22   ? Status Achieved   ?  ? SLP SHORT TERM GOAL #2  ? Title pt will average 70-72 dB across 5 minute conversational sample   ? Baseline 02/02/22 increased average to high 60s with usual min A   ? Status Partially Met   ?  ? SLP SHORT TERM GOAL #3  ? Title pt with average 70-72 dB at sentence level 18/20 sentences over 2 sessions   ? Baseline 01/24/22; 01/31/22   ? Status Achieved   ?  ? SLP SHORT TERM GOAL #4  ? Title pt will have 10 or less throat clears over 45 minute session   ? Baseline 01/24/22; 02/02/22   ? Status Achieved   ? ?  ?  ? ?  ? ? ? SLP Long Term  Goals - 02/16/22 1221   ? ?  ? SLP LONG TERM GOAL #1  ? Title pt will average 90 dB with x5 loud "ah" mod I over 3 sessions   ? Time 6   ? Period Weeks   ? Status On-going   ?  ? SLP LONG TERM GOAL #2  ? Title pt will average 70-72 dB over 20 minute conversation with rare min A   ? Time 6   ? Period Weeks   ? Status On-going   ?  ? SLP LONG TERM GOAL #3  ? Title pt/spouse report 50% decrease in requests for repetition subjectively at home   ? Time 6   ? Period Weeks   ? Status On-going   ?  ? SLP LONG TERM GOAL #4  ? Title pt/spouse will report compliance with vocal hygiene recommendations over 1 week period   ? Time 6   ? Period Weeks   ? Status On-going   ? ?  ?  ? ?  ? ? ? Plan - 02/16/22 1220   ? ? Clinical Impression Statement [name] has Parkinson's and presents with reduced vocal intensity/volume and hoarseness during speech.  Pt demonstrating improvements in vocal intensity and vocal quality during structured speech tasks in therapy, requires mod A to sustain during  spontanous productions. Pt's spontaneous speech averages in low 60s dB. 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   ? Speech Therapy Frequency 2x / week   ? Duration 12 weeks   ? Treatment/Interventions Functional tasks;Cueing hierarchy;Environmental controls;Patient/family education;SLP instruction and feedback;Internal/external aids;Compensatory strategies;Compensatory techniques   ? Potential to Achieve Goals Good   ? Potential Considerations Medical prognosis   ? SLP Home Exercise Plan loud "ah"; oral reading, vocal glides   ? Consulted and Agree with Plan of Care Patient;Family member/caregiver   ? Family Member Consulted wife, [name]   ? ?  ?  ? ?  ? ? ?Patient will benefit from skilled therapeutic intervention in order to improve the following deficits and impairments:   ?Dysarthria ? ? ? ?Problem List ?Patient Active Problem List  ? Diagnosis Date Noted  ? 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 Aortic atherosclerosis ([place]) 02/01/2022  ? Irregular heart rate 01/12/2022  ? OSA (obstructive sleep apnea) 01/12/2022  ? Parkinson's disease ([place]) 11/25/2020  ? Edema of right lower leg 11/25/2020  ? Hyperlipidemia LDL goal <70 11/25/2020  ? 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 Hearing loss 11/25/2020  ? Pityriasis rosea 11/25/2020  ? ? ?Su Monks, CF-SLP ?02/16/2022, 1:08 PM ? ?[place] ?[facility] ?[place], [place], [address] ?Phone: [phone]   Fax:  [phone] ? ? ?Name: [name] ?MRN: [id number] ?Date of Birth: 22-Apr-1946 ? ?

## 2022-02-16 NOTE — Therapy (Signed)
?OUTPATIENT PHYSICAL THERAPY TREATMENT NOTE ? ? ?Patient Name: [name] ?MRN: [id number] ?DOB:Oct 01, 1946, 76 y.o., male ?Today's Date: 02/16/2022 ? ?PCP: Lesleigh Noe, MD ?REFERRING PROVIDER: Star Age, MD  ? ? PT End of Session - 02/16/22 1403   ? ? Visit Number 13   ? Number of Visits 17   Plus eval  ? Date for PT Re-Evaluation 03/30/22   ? Authorization Type [employer]   ? Authorization Time Period 01/05/22 - 03/30/22   ? Progress Note Due on Visit 10   ? PT Start Time 1400   ? PT Stop Time 0037   ? PT Time Calculation (min) 47 min   ? Equipment Utilized During Treatment Gait belt   ? Activity Tolerance Patient tolerated treatment well   ? Behavior During Therapy [facility] for tasks assessed/performed   ? ?  ?  ? ?  ? ? ? ? ? ?Past Medical History:  ?Diagnosis Date  ? Agatston coronary artery calcium score greater than 400   ? Coronary calcium score of 533. This was 69th percentile for age,  ? Aortic atherosclerosis ([place])   ? Hyperlipidemia LDL goal <70   ? Parkinson disease ([place])   ? ?Past Surgical History:  ?Procedure Laterality Date  ? TONSILLECTOMY    ? ?Patient Active Problem List  ? Diagnosis Date Noted  ? 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 Aortic atherosclerosis ([place]) 02/01/2022  ? Irregular heart rate 01/12/2022  ? OSA (obstructive sleep apnea) 01/12/2022  ? Parkinson's disease ([place]) 11/25/2020  ? Edema of right lower leg 11/25/2020  ? Hyperlipidemia LDL goal <70 11/25/2020  ? 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 Hearing loss 11/25/2020  ? Pityriasis rosea 11/25/2020  ? ? ?REFERRING DIAG: G20 (ICD-10-CM) - Parkinson's disease ([place])  ? ?THERAPY DIAG:  ?Abnormal posture ? ?Other abnormalities of gait and mobility ? ?Unsteadiness on feet ? ?PERTINENT HISTORY: HLD, cellulitis, lymphedema, low back pain and hearing loss. ? ?PRECAUTIONS: Fall ? ?SUBJECTIVE: Pt reports his sleeping continues to be affected by the CPAP mask. Exercises are going well, still challenging.  Pt ambulated into clinic with rollator  ? ?PAIN:  ?Are you having pain? No ? ? ?TODAY'S TREATMENT:  ?Gait Training  ?The following treadmill training was completed for aerobic/neural priming, endurance, and gait speed.  ?- Warmup: 2:00 up to 2.0 mph ?- HIIT: 4:00 30sec ON/OFF alternating ball kicks and normal walking at 2.0 mph   ?-Cooldown: 2:00 from 2.0 to 1.0 mph  ? -Noted scissoring of RLE throughout  ? ?Ther Ex ?On mat table for improved bilat hip strength, posterior chain and core stability. Added to HEP (see bolded below): ?-Quadruped fire hydrants, 2x10 per side w/2s isometric hold  ?-Dead bugs, x10 per side. Max visual cues for proper sequencing  ?-Supermans, x20 with 2s isometric hold. Pt unable to extend BUEs off mat, min cues to maintain neutral neck position  ? ?Gait Training ?Gait pattern: step through pattern, decreased step length- Right, scissoring, trunk flexed, and poor foot clearance- Right ?Distance walked: Various clinic distances  ?Assistive device utilized: Environmental consultant - 4 wheeled ?Level of assistance: Modified independence ?Comments: Adjusted pt's rollator to taller height and adjusted L brake.Noted mild scissoring of RLE throughout session, min cues to push RLE out  ?  ? ?PATIENT EDUCATION: ?Education details: Updated HEP  ?Person educated: Patient ?Education method: Explanation  ?Education comprehension: verbalized understanding ? ? ?HOME EXERCISE PROGRAM: ?Access Code: [email or web address] ?URL: [email or web address] ?Date: 02/16/2022 ?  Prepared by: Mickie Bail Angelisse Riso ? ?Exercises ?- Wall push up with strong push away  - 1 x daily - 7 x weekly - 3 sets - 10 reps ?- Quadruped Thoracic Rotation - Reach Under  - 1 x daily - 7 x weekly - 3 sets - 10 reps ?- Half-Kneeling Forward Lean  - 1 x daily - 7 x weekly - 3 sets - 10 reps ?- Quadruped rock back  - 1 x daily - 7 x weekly - 3 sets - 10 reps ?- Dead Bug  - 1 x daily - 7 x weekly - 3 sets - 10 reps ?- Quadruped Ambulance person  - 1 x daily - 7 x  weekly - 3 sets - 10 reps ?- Modified Superman on Table  - 1 x daily - 7 x weekly - 3 sets - 10 reps - 2second hold ? ? ? ? PT Short Term Goals - 01/06/22 0806   ? ?  ? PT SHORT TERM GOAL #1  ? Title Pt will be independent with initial HEP with wife's supervision in order to build upon functional gains made in therapy. ALL STGS DUE 02/03/22   ? Time 4   ? Period Weeks   ? Status Achieved   ? Target Date 02/03/22   ?  ? PT SHORT TERM GOAL #2  ? Title Pt will maintain gait speed of 2.8 ft/sec with LRAD with supervision in order to demo improved community ambulation.   ? Baseline 2.77 ft/sec with no AD - min guard; 3.3 ft/s without RW and 3.23 ft/s with AD  ? Time 4   ? Period Weeks   ? Status Achieved   ?  ? PT SHORT TERM GOAL #3  ? Title Pt will improve 5x sit <> stand with no UE support to 19 seconds or less in order to demo improved functional strength/decr fall risk.   ? Baseline 22.97 seconds, 12.5s on 3/21    ? Time 4   ? Period Weeks   ? Status Achieved   ?  ? PT SHORT TERM GOAL #4  ? Title Pt will ambulate at least 230' over level indoor surfaces with LRAD with supervision for improved safety with mobility.   ? Baseline no AD- needs min guard and min A for balance at times; no AD, CGA   ? Time 4   ? Period Weeks   ? Status Acheived   ?  ? PT SHORT TERM GOAL #5  ? Title Pt and pt's wife will verbalize understanding of fall prevention in the home.   ? Time 4   ? Period Weeks   ? Status Achieved   ? ?  ?  ? ?  ? ? ? PT Long Term Goals - 01/06/22 0818   ? ?  ? PT LONG TERM GOAL #1  ? Title Pt will be independent with final HEP with wife's supervision in order to build upon functional gains made in therapy. ALL LTGS DUE 03/03/22   ? Time 8   ? Period Weeks   ? Status New   ? Target Date 03/03/22   ?  ? PT LONG TERM GOAL #2  ? Title Pt will improve miniBEST to at least a 18/28 in order to demo decr fall risk.   ? Baseline 12/28   ? Time 8   ? Period Weeks   ? Status New   ?  ? PT LONG TERM GOAL #3  ? Title Pt will  improve 5x sit <> stand with no UE support to 15 seconds or less in order to demo improved functional strength/decr fall risk.   ? Baseline 22.97 seconds   ? Time 8   ? Period Weeks   ? Status New   ?  ? PT LONG TERM GOAL #4  ? Title Pt will ambulate at least 400' over outdoor paved surfaces with LRAD with mod I in order to demo improved community mobility.   ? Time 8   ? Period Weeks   ? Status New   ?  ? PT LONG TERM GOAL #5  ? Title Pt will verbalize understanding of local Parkinson's disease resources,   ? Time 8   ? Period Weeks   ? Status New   ?  ? Additional Long Term Goals  ? Additional Long Term Goals Yes   ?  ? PT LONG TERM GOAL #6  ? Title Pt will recover posterior and anterior balance in push and release test in 2 steps independently, for improved balance recovery   ? Time 8   ? Period Weeks   ? Status New   ? ?  ?  ? ?  ? ? ? Plan - 01/26/22 1458   ? ? Clinical Impression Statement Emphasis of skilled PT session on improved step length, UE/LE coordination and R hip strengthening. Noted pt scissoring RLE while on treadmill, which reduces when walking on ground. Updated HEP to include more coordination tasks and strengthen hip abductors for reduction of scissoring with gait. Adjusted pt's rollator to proper height. Continue POC.   ? Personal Factors and Comorbidities Age;Fitness;Past/Current Experience;Comorbidity 1;Time since onset of injury/illness/exacerbation   ? Comorbidities Cellulitis   ? Examination-Activity Limitations Bend;Carry;Reach Overhead;Locomotion Level;Lift;Stairs;Squat;Stand;Transfers   ? Examination-Participation Restrictions Community Activity   ? Stability/Clinical Decision Making Evolving/Moderate complexity   ? Rehab Potential Good   ? PT Frequency 2x / week   ? PT Duration 12 weeks   ? PT Treatment/Interventions ADLs/Self Care Home Management;Gait training;DME Instruction;Stair training;Functional mobility training;Therapeutic activities;Therapeutic exercise;Balance  training;Neuromuscular re-education;Cognitive remediation;Patient/family education;Energy conservation;Vestibular   ? PT Next Visit Plan How is new HEP? TM training, boxing progression, DL progression, rebounder stepping,

## 2022-02-16 NOTE — Therapy (Signed)
[place] ?[address] ?[place], [place], [address] ?Phone: [phone]   Fax:  [phone] ? ?Occupational Therapy Treatment ? ?Patient Details  ?Name: [name] ?MRN: [id number] ?Date of Birth: 10-31-1946 ?Referring Provider (OT): Dr. Rexene Alberts ? ? ?Encounter Date: 02/16/2022 ? ? OT End of Session - 02/16/22 1546   ? ? Visit Number 11   ? Number of Visits 25   ? Date for OT Re-Evaluation 03/31/22   ? Authorization Type [employer]   ? Authorization Time Period 60 days- POC written for 12 weeks anticiapte d/c following 6-8 weeks   ? Authorization - Visit Number 11   ? Authorization - Number of Visits 10   ? Progress Note Due on Visit 20   ? OT Start Time 1320   ? OT Stop Time 1400   ? OT Time Calculation (min) 40 min   ? Activity Tolerance Patient tolerated treatment well   ? Behavior During Therapy [facility] for tasks assessed/performed   ? ?  ?  ? ?  ? ? ?Past Medical History:  ?Diagnosis Date  ? Agatston coronary artery calcium score greater than 400   ? Coronary calcium score of 533. This was 69th percentile for age,  ? Aortic atherosclerosis ([place])   ? Hyperlipidemia LDL goal <70   ? Parkinson disease ([place])   ? ? ?Past Surgical History:  ?Procedure Laterality Date  ? TONSILLECTOMY    ? ? ?There were no vitals filed for this visit. ? ? Subjective Assessment - 02/16/22 1326   ? ? Subjective  Denies pain   ? Pertinent History PD diganosed in 2019   cellulitis, lymphedema, low back pain, hyperlipidemia,   ? Patient Stated Goals mobility, maintain independence   ? Currently in Pain? No/denies   ? ?  ?  ? ?  ? ? ? ? ? ? ? ? ? ? ?Treatment: closed chain shoulder flexion and chest press in supine, min v.c ?Seated dynamic functional reaching with trunk rotation, min v.c for upright posture ?Closed chain shoulder flexion, reach for floor and overhead, min v.c  ?Seated with bilateral UE;'s in external rotation, min v.c , low range shoulder extension in seated   ? ? ? ? ? ? ? ? ? OT Education - 02/16/22 1326   ? ? Education Details PWR! moves basic 4 in supine, min v.c / tactile cues initally , pt was shown how to use for bed mobility  ? Person(s) Educated Patient   ? Methods Explanation;Demonstration;Verbal cues;Tactile cues   ? Comprehension Verbalized understanding;Returned demonstration;Verbal cues required   ? ?  ?  ? ?  ? ? ? OT Short Term Goals - 02/16/22 1345   ? ?  ? OT SHORT TERM GOAL #1  ? Title I with PD specific HEP   ? Time 4   ? Period Weeks   ? Status Achieved   ? Target Date 02/03/22   ?  ? OT SHORT TERM GOAL #2  ? Title Pt will verbalize understanding of adapted strategies to maximize safety and I with ADLs/ IADLs .   ? Time 4   ? Period Weeks   ? Status On-going   needs reinforcement  ?  ? OT SHORT TERM GOAL #3  ? Title Pt will demonstrate improved fine motor coordination for ADLs as evidenced by decreasing 9 hole peg test score for RUE by 3 secs   ? Baseline R 38.25, L 33.75   ? Time 4   ?  Period Weeks   ? Status Achieved   01/31/22:  R-27.62sec, L-29.53sec  ?  ? OT SHORT TERM GOAL #4  ? Title Pt will demonstrated improved LUE functional use as evidenced by increasing box/ blocks by 3 blocks   ? Baseline R 45, L 39   ? Time 4   ? Period Weeks   ? Status Achieved   01/31/22:  R-46 blocks, L-43 blocks  ?  ? OT SHORT TERM GOAL #5  ? Title Pt will verbalize understanding of ways to keep thinking skills sharp.   ? Time 4   ? Period Weeks   ? Status Achieved   ? ?  ?  ? ?  ? ? ? ? OT Long Term Goals - 02/16/22 1334   ? ?  ? OT LONG TERM GOAL #1  ? Title Pt will verbalize understanding of ways to prevent future PD related complications and PD community resources.   ? Time 12   ? Period Weeks   ? Status Achieved   ? Target Date 03/31/22   ?  ? OT LONG TERM GOAL #2  ? Title Pt will demonstrate improved ease with feeding as evidenced by decreasing PPT#2 (self feeding) by 3 secs   ? Baseline 14.19 secs   ? Time 12   ? Period Weeks   ? Status On-going   ?  ? OT LONG  TERM GOAL #3  ? Title Pt will retrieve an item at 130 shoulder flexion and -20 elbow extension with RUE.   ? Baseline 125, -25   ? Time 12   ? Period Weeks   ? Status Achieved   130,-10  ?  ? OT LONG TERM GOAL #4  ? Title Pt will demonstrate ability to write a short paragraph with 100% legibility and no significant decrease in letter size.   ? Time 12   ? Period Weeks   ? Status Achieved   ? ?  ?  ? ?  ? ? ? ? ? ? ? ? Plan - 02/16/22 1330   ? ? Clinical Impression Statement Pt is progressing towards goals. He demonstrates understanding of supine PWR! moves folllowing review.   ? OT Occupational Profile and History Detailed Assessment- Review of Records and additional review of physical, cognitive, psychosocial history related to current functional performance   ? Occupational performance deficits (Please refer to evaluation for details): ADL's;IADL's;Leisure;Play;Social Participation   ? Body Structure / Function / Physical Skills ADL;Balance;Endurance;Mobility;Strength;Flexibility;UE functional use;FMC;Gait;Coordination;ROM;GMC;Decreased knowledge of precautions;Decreased knowledge of use of DME;IADL;Dexterity   ? Rehab Potential Good   ? Clinical Decision Making Limited treatment options, no task modification necessary   ? Comorbidities Affecting Occupational Performance: May have comorbidities impacting occupational performance   ? Modification or Assistance to Complete Evaluation  No modification of tasks or assist necessary to complete eval   ? OT Frequency 2x / week   ? OT Duration 12 weeks   ? OT Treatment/Interventions Self-care/ADL training;Therapeutic exercise;Functional Mobility Training;Balance training;Aquatic Therapy;Ultrasound;Neuromuscular education;Splinting;Manual Therapy;Therapeutic activities;DME and/or AE instruction;Cognitive remediation/compensation;Gait Training;Moist Heat;Fluidtherapy;Paraffin;Cryotherapy;Passive range of motion;Patient/family education   ? Plan ADL strategies, dynamic  reaching   ? ?  ?  ? ?  ? ? ?Patient will benefit from skilled therapeutic intervention in order to improve the following deficits and impairments:   ?Body Structure / Function / Physical Skills: ADL, Balance, Endurance, Mobility, Strength, Flexibility, UE functional use, FMC, Gait, Coordination, ROM, GMC, Decreased knowledge of precautions, Decreased knowledge of use of DME, IADL, Dexterity ?  ?  ? ? ?  Visit Diagnosis: ?Other lack of coordination ? ?Other symptoms and signs involving the nervous system ? ?Other abnormalities of gait and mobility ? ?Unsteadiness on feet ? ?Abnormal posture ? ? ? ?Problem List ?Patient Active Problem List  ? Diagnosis Date Noted  ? 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 Aortic atherosclerosis ([place]) 02/01/2022  ? Irregular heart rate 01/12/2022  ? OSA (obstructive sleep apnea) 01/12/2022  ? Parkinson's disease ([place]) 11/25/2020  ? Edema of right lower leg 11/25/2020  ? Hyperlipidemia LDL goal <70 11/25/2020  ? 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 Hearing loss 11/25/2020  ? Pityriasis rosea 11/25/2020  ? ? ?Chihiro Frey, OT ?02/16/2022, 5:26 PM ? ?[place] ?[address] ?[place], [place], [address] ?Phone: [phone]   Fax:  [phone] ? ?Name: [name] ?MRN: [id number] ?Date of Birth: Jun 20, 1946 ? ?

## 2022-02-21 ENCOUNTER — Ambulatory Visit: Payer: Medicare HMO | Admitting: Physical Therapy

## 2022-02-21 ENCOUNTER — Ambulatory Visit: Payer: Medicare HMO | Admitting: Speech Pathology

## 2022-02-21 ENCOUNTER — Encounter: Payer: Self-pay | Admitting: Occupational Therapy

## 2022-02-21 ENCOUNTER — Ambulatory Visit: Payer: Medicare HMO | Admitting: Occupational Therapy

## 2022-02-21 DIAGNOSIS — R293 Abnormal posture: Secondary | ICD-10-CM

## 2022-02-21 DIAGNOSIS — R278 Other lack of coordination: Secondary | ICD-10-CM | POA: Diagnosis not present

## 2022-02-21 DIAGNOSIS — R29818 Other symptoms and signs involving the nervous system: Secondary | ICD-10-CM | POA: Diagnosis not present

## 2022-02-21 DIAGNOSIS — M6281 Muscle weakness (generalized): Secondary | ICD-10-CM | POA: Diagnosis not present

## 2022-02-21 DIAGNOSIS — R2681 Unsteadiness on feet: Secondary | ICD-10-CM | POA: Diagnosis not present

## 2022-02-21 DIAGNOSIS — R2689 Other abnormalities of gait and mobility: Secondary | ICD-10-CM

## 2022-02-21 DIAGNOSIS — R471 Dysarthria and anarthria: Secondary | ICD-10-CM | POA: Diagnosis not present

## 2022-02-21 NOTE — Therapy (Signed)
[place] ?[place] ?[place], [place], [address] ?Phone: [phone]   Fax:  [phone] ? ?Speech Language Pathology Treatment ? ?Patient Details  ?Name: [name] ?MRN: [id number] ?Date of Birth: [birth year] ?Referring Provider (SLP): Dr. Rexene Alberts ? ? ?Encounter Date: 02/21/2022 ? ? End of Session - 02/21/22 1402   ? ? Visit Number 13   ? Number of Visits 25   ? Date for SLP Re-Evaluation 04/07/22   ? SLP Start Time 2353   pt required break in between back to back tx sessions  ? SLP Stop Time  1445   ? SLP Time Calculation (min) 40 min   ? Activity Tolerance Patient tolerated treatment well   ? ?  ?  ? ?  ? ? ?Past Medical History:  ?Diagnosis Date  ? Agatston coronary artery calcium score greater than 400   ? Coronary calcium score of 533. This was 69th percentile for age,  ? Aortic atherosclerosis ([place])   ? Hyperlipidemia LDL goal <70   ? Parkinson disease ([place])   ? ? ?Past Surgical History:  ?Procedure Laterality Date  ? TONSILLECTOMY    ? ? ?There were no vitals filed for this visit. ? ? ? ? ? ? ? ? ? ? ADULT SLP TREATMENT - 02/21/22 1408   ? ?  ? General Information  ? Behavior/Cognition Alert;Cooperative;Pleasant mood   ?  ? Treatment Provided  ? Treatment provided Cognitive-Linquistic   ?  ? Cognitive-Linquistic Treatment  ? 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 Skilled Treatment Led pt through Speak Out! lesson 25. ST provided rare min-A throughout exercises to obtain listed average dBs. Averages for structured excersies this date as follows: loud ah: 82 dB; reading (paragraphs) 74 dB, cognitive exercie 69 dB. During conversational speech sample, pt averages 62 dB. Usual min-A required to increase vocal intensity. Overall, ST notes less throat clears this date, improved vocal quality with reduced hoarsness during structured exercies.   ?  ? Assessment / Recommendations / Plan  ? Plan Continue with current plan of care   ?  ? Progression  Toward Goals  ? Progression toward goals Progressing toward goals   ? ?  ?  ? ?  ? ? ? ? ? ? SLP Short Term Goals - 02/21/22 1404   ? ?  ? SLP SHORT TERM GOAL #1  ? Title pt will produce loud "ah" averaging 90 dB over 5 trials with rare min A across 2 sessions   ? Baseline 01/24/22; 01/31/22   ? Status Achieved   ?  ? SLP SHORT TERM GOAL #2  ? Title pt will average 70-72 dB across 5 minute conversational sample   ? Baseline 02/02/22 increased average to high 60s with usual min A   ? Status Partially Met   ?  ? SLP SHORT TERM GOAL #3  ? Title pt with average 70-72 dB at sentence level 18/20 sentences over 2 sessions   ? Baseline 01/24/22; 01/31/22   ? Status Achieved   ?  ? SLP SHORT TERM GOAL #4  ? Title pt will have 10 or less throat clears over 45 minute session   ? Baseline 01/24/22; 02/02/22   ? Status Achieved   ? ?  ?  ? ?  ? ? ? SLP Long Term Goals - 02/21/22 1404   ? ?  ? SLP LONG TERM GOAL #1  ? Title pt will average 90 dB with x5 loud "  ah" mod I over 3 sessions   ? Time 5   ? Period Weeks   ? Status On-going   ?  ? SLP LONG TERM GOAL #2  ? Title pt will average 70-72 dB over 20 minute conversation with rare min A   ? Time 5   ? Period Weeks   ? Status On-going   ?  ? SLP LONG TERM GOAL #3  ? Title pt/spouse report 50% decrease in requests for repetition subjectively at home   ? Time 5   ? Period Weeks   ? Status On-going   ?  ? SLP LONG TERM GOAL #4  ? Title pt/spouse will report compliance with vocal hygiene recommendations over 1 week period   ? Time 5   ? Period Weeks   ? Status On-going   ? ?  ?  ? ?  ? ? ? Plan - 02/21/22 1404   ? ? Clinical Impression Statement [name] has Parkinson's and presents with reduced, yet improving, vocal intensity/volume and hoarseness during speech.  Pt demonstrating improvements in vocal intensity and vocal quality during structured speech tasks in therapy, requires mod A to sustain during spontanous productions. Pt's spontaneous speech averages in low 60s dB. 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   ? Speech Therapy Frequency 2x / week   ? Duration 12 weeks   ? Treatment/Interventions Functional tasks;Cueing hierarchy;Environmental controls;Patient/family education;SLP instruction and feedback;Internal/external aids;Compensatory strategies;Compensatory techniques   ? Potential to Achieve Goals Good   ? Potential Considerations Medical prognosis   ? SLP Home Exercise Plan Speak Out! exercises   ? Consulted and Agree with Plan of Care Patient;Family member/caregiver   ? Family Member Consulted wife, [name]   ? ?  ?  ? ?  ? ? ?Patient will benefit from skilled therapeutic intervention in order to improve the following deficits and impairments:   ?Dysarthria ? ? ? ?Problem List ?Patient Active Problem List  ? Diagnosis Date Noted  ? 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 Aortic atherosclerosis ([place]) 02/01/2022  ? Irregular heart rate 01/12/2022  ? OSA (obstructive sleep apnea) 01/12/2022  ? Parkinson's disease ([place]) 11/25/2020  ? Edema of right lower leg 11/25/2020  ? Hyperlipidemia LDL goal <70 11/25/2020  ? 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 Hearing loss 11/25/2020  ? Pityriasis rosea 11/25/2020  ? ? ?Su Monks, CF-SLP ?02/21/2022, 2:32 PM ? ?[place] ?[place] ?[place], [place], [address] ?Phone: [phone]   Fax:  [phone] ? ? ?Name: [name] ?MRN: [id number] ?Date of Birth: Jan 07, 1946 ? ?

## 2022-02-21 NOTE — Therapy (Signed)
?OUTPATIENT PHYSICAL THERAPY TREATMENT NOTE ? ? ?Patient Name: [name] ?MRN: [id number] ?DOB:[birth year], 76 y.o., male ?Today's Date: 02/21/2022 ? ?PCP: Lesleigh Noe, MD ?REFERRING PROVIDER: Star Age, MD  ? ? PT End of Session - 02/21/22 1226   ? ? Visit Number 14   ? Number of Visits 17   Plus eval  ? Date for PT Re-Evaluation 03/30/22   ? Authorization Type [employer]   ? Authorization Time Period 01/05/22 - 03/30/22   ? Progress Note Due on Visit 10   ? PT Start Time 1225   ? PT Stop Time 1314   ? PT Time Calculation (min) 49 min   ? Equipment Utilized During Treatment Gait belt   ? Activity Tolerance Patient tolerated treatment well   ? Behavior During Therapy [facility] for tasks assessed/performed   ? ?  ?  ? ?  ? ? ? ? ? ? ?Past Medical History:  ?Diagnosis Date  ? Agatston coronary artery calcium score greater than 400   ? Coronary calcium score of 533. This was 69th percentile for age,  ? Aortic atherosclerosis ([place])   ? Hyperlipidemia LDL goal <70   ? Parkinson disease ([place])   ? ?Past Surgical History:  ?Procedure Laterality Date  ? TONSILLECTOMY    ? ?Patient Active Problem List  ? Diagnosis Date Noted  ? 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 Aortic atherosclerosis ([place]) 02/01/2022  ? Irregular heart rate 01/12/2022  ? OSA (obstructive sleep apnea) 01/12/2022  ? Parkinson's disease ([place]) 11/25/2020  ? Edema of right lower leg 11/25/2020  ? Hyperlipidemia LDL goal <70 11/25/2020  ? 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 Hearing loss 11/25/2020  ? Pityriasis rosea 11/25/2020  ? ? ?REFERRING DIAG: G20 (ICD-10-CM) - Parkinson's disease ([place])  ? ?THERAPY DIAG:  ?Abnormal posture ? ?Unsteadiness on feet ? ?Other abnormalities of gait and mobility ? ?PERTINENT HISTORY: HLD, cellulitis, lymphedema, low back pain and hearing loss. ? ?PRECAUTIONS: Fall ? ?SUBJECTIVE: Pt reports rollator does well on sidewalk, no new changes.  ? ?PAIN:  ?Are you having pain? No ? ? ?TODAY'S  TREATMENT:  ?Gait Training  ?The following treadmill training was completed for aerobic/neural priming, endurance, and gait speed.  ?- Warmup: 3:00 up to 1.8 mph ?- HIIT: 5:00 30sec ON/OFF alternating ball kicks and normal walking at 1.8 mph w/1% incline  ?-Cooldown: 2:00 at 1.8 mph ? ?NMR ?At rebounder for improved stepping strategy, dynamic standing balance, dual-tasking and thoracic rotation:  ?-Standing lateral to rebounder, forward alt. Lunge w/2kg slam ball throw and catch, x10 per side with rebounder to L and R side. Noted increased step length/clearance bilaterally, improved stability, and ability to self-correct posture. Pt demonstrates increased difficulty when throwing when rotated to L side > R side. Min guard throughout for safety.  ?-Standing in front of rebounder, alt fwd lunge w/OH slam ball throw and catch for improved posture, anterior weight shift and stepping strategy. Progressed to adding cog dual-task (naming foods in alphabetical order) and noted decrease in lunge length, OH reach and velocity of throw. Min guard throughout for safety.  ? ? ?PATIENT EDUCATION: ?Education details: Updated HEP, POC moving forward  ?Person educated: Patient ?Education method: Explanation  ?Education comprehension: verbalized understanding ? ? ?HOME EXERCISE PROGRAM: ?Access Code: [email or web address] ?URL: [email or web address] ?Date: 02/16/2022 ?Prepared by: Mickie Bail Orley Lawry ? ?Exercises ?- Wall push up with strong push away  - 1 x daily - 7 x weekly - 3  sets - 10 reps ?- Quadruped Thoracic Rotation - Reach Under  - 1 x daily - 7 x weekly - 3 sets - 10 reps ?- Half-Kneeling Forward Lean  - 1 x daily - 7 x weekly - 3 sets - 10 reps ?- Quadruped rock back  - 1 x daily - 7 x weekly - 3 sets - 10 reps ?- Dead Bug  - 1 x daily - 7 x weekly - 3 sets - 10 reps ?- Quadruped Ambulance person  - 1 x daily - 7 x weekly - 3 sets - 10 reps ?- Modified Superman on Table  - 1 x daily - 7 x weekly - 3 sets - 10 reps - 2second  hold ? ? ? ? PT Short Term Goals - 01/06/22 0806   ? ?  ? PT SHORT TERM GOAL #1  ? Title Pt will be independent with initial HEP with wife's supervision in order to build upon functional gains made in therapy. ALL STGS DUE 02/03/22   ? Time 4   ? Period Weeks   ? Status Achieved   ? Target Date 02/03/22   ?  ? PT SHORT TERM GOAL #2  ? Title Pt will maintain gait speed of 2.8 ft/sec with LRAD with supervision in order to demo improved community ambulation.   ? Baseline 2.77 ft/sec with no AD - min guard; 3.3 ft/s without RW and 3.23 ft/s with AD  ? Time 4   ? Period Weeks   ? Status Achieved   ?  ? PT SHORT TERM GOAL #3  ? Title Pt will improve 5x sit <> stand with no UE support to 19 seconds or less in order to demo improved functional strength/decr fall risk.   ? Baseline 22.97 seconds, 12.5s on 3/21    ? Time 4   ? Period Weeks   ? Status Achieved   ?  ? PT SHORT TERM GOAL #4  ? Title Pt will ambulate at least 230' over level indoor surfaces with LRAD with supervision for improved safety with mobility.   ? Baseline no AD- needs min guard and min A for balance at times; no AD, CGA   ? Time 4   ? Period Weeks   ? Status Acheived   ?  ? PT SHORT TERM GOAL #5  ? Title Pt and pt's wife will verbalize understanding of fall prevention in the home.   ? Time 4   ? Period Weeks   ? Status Achieved   ? ?  ?  ? ?  ? ? ? PT Long Term Goals - 01/06/22 0818   ? ?  ? PT LONG TERM GOAL #1  ? Title Pt will be independent with final HEP with wife's supervision in order to build upon functional gains made in therapy. ALL LTGS DUE 03/03/22   ? Time 8   ? Period Weeks   ? Status New   ? Target Date 03/03/22   ?  ? PT LONG TERM GOAL #2  ? Title Pt will improve miniBEST to at least a 18/28 in order to demo decr fall risk.   ? Baseline 12/28   ? Time 8   ? Period Weeks   ? Status New   ?  ? PT LONG TERM GOAL #3  ? Title Pt will improve 5x sit <> stand with no UE support to 15 seconds or less in order to demo improved functional  strength/decr fall risk.   ?  Baseline 22.97 seconds   ? Time 8   ? Period Weeks   ? Status New   ?  ? PT LONG TERM GOAL #4  ? Title Pt will ambulate at least 400' over outdoor paved surfaces with LRAD with mod I in order to demo improved community mobility.   ? Time 8   ? Period Weeks   ? Status New   ?  ? PT LONG TERM GOAL #5  ? Title Pt will verbalize understanding of local Parkinson's disease resources,   ? Time 8   ? Period Weeks   ? Status New   ?  ? Additional Long Term Goals  ? Additional Long Term Goals Yes   ?  ? PT LONG TERM GOAL #6  ? Title Pt will recover posterior and anterior balance in push and release test in 2 steps independently, for improved balance recovery   ? Time 8   ? Period Weeks   ? Status New   ? ?  ?  ? ?  ? ? ? Plan - 01/26/22 1458   ? ? Clinical Impression Statement Emphasis of skilled PT session on stepping strategy, dual-tasking and increased step length. Pt able to clear bilateral feet on treadmill during ball kicks while on incline. Pt has significantly improved his stepping strategies, particularly in anterior and lateral directions. Pt continues to exhibit difficulty with dual-tasks but is able to maintain balance with challenging tasks. Discussed potential to add more PT visits. Continue POC.   ? Personal Factors and Comorbidities Age;Fitness;Past/Current Experience;Comorbidity 1;Time since onset of injury/illness/exacerbation   ? Comorbidities Cellulitis   ? Examination-Activity Limitations Bend;Carry;Reach Overhead;Locomotion Level;Lift;Stairs;Squat;Stand;Transfers   ? Examination-Participation Restrictions Community Activity   ? Stability/Clinical Decision Making Evolving/Moderate complexity   ? Rehab Potential Good   ? PT Frequency 2x / week   ? PT Duration 12 weeks   ? PT Treatment/Interventions ADLs/Self Care Home Management;Gait training;DME Instruction;Stair training;Functional mobility training;Therapeutic activities;Therapeutic exercise;Balance training;Neuromuscular  re-education;Cognitive remediation;Patient/family education;Energy conservation;Vestibular   ? PT Next Visit Plan How is new HEP? Add more visits? TM training, boxing progression, DL progression, rebounder stepping, cloc

## 2022-02-21 NOTE — Therapy (Signed)
[facility] ?[place], [place], [address] ?Phone: [phone]   Fax:  [phone] ? ?Occupational Therapy Treatment ? ?Patient Details  ?Name: [name] ?MRN: [id number] ?Date of Birth: 21-Aug-1946 ?Referring Provider (OT): Dr. Rexene Alberts ? ? ?Encounter Date: 02/21/2022 ? ? ? ? ?Past Medical History:  ?Diagnosis Date  ? Agatston coronary artery calcium score greater than 400   ? Coronary calcium score of 533. This was 69th percentile for age,  ? Aortic atherosclerosis ([place])   ? Hyperlipidemia LDL goal <70   ? Parkinson disease ([place])   ? ? ?Past Surgical History:  ?Procedure Laterality Date  ? TONSILLECTOMY    ? ? ?There were no vitals filed for this visit. ? ? ? ? ? ? ? ? ? ? ? ?Treatment: Reviewed PWR! Moves standing at countertop (rock, twist and step), min v.c for amplitude ?Dynamic functional reaching with left and right UE's in seated and standing, min v.c for posture and amplitude. ?Arm bike 6 mins level 1 for conditioning. ? ? ? ? ? ? ? ? ? ? ? ? ? ? ? OT Short Term Goals - 02/23/22 1243   ? ?  ? OT SHORT TERM GOAL #1  ? Title I with PD specific HEP   ? Time 4   ? Period Weeks   ? Status Achieved   ? Target Date 02/03/22   ?  ? OT SHORT TERM GOAL #2  ? Title Pt will verbalize understanding of adapted strategies to maximize safety and I with ADLs/ IADLs .   ? Time 4   ? Period Weeks   ? Status On-going   needs reinforcement  ?  ? OT SHORT TERM GOAL #3  ? Title Pt will demonstrate improved fine motor coordination for ADLs as evidenced by decreasing 9 hole peg test score for RUE by 3 secs   ? Baseline R 38.25, L 33.75   ? Time 4   ? Period Weeks   ? Status Achieved   01/31/22:  R-27.62sec, L-29.53sec  ?  ? OT SHORT TERM GOAL #4  ? Title Pt will demonstrated improved LUE functional use as evidenced by increasing box/ blocks by 3 blocks   ? Baseline R 45, L 39   ? Time 4   ? Period Weeks   ? Status Achieved   01/31/22:  R-46 blocks, L-43 blocks  ?   ? OT SHORT TERM GOAL #5  ? Title Pt will verbalize understanding of ways to keep thinking skills sharp.   ? Time 4   ? Period Weeks   ? Status Achieved   ? ?  ?  ? ?  ? ? ? ? OT Long Term Goals - 02/23/22 1242   ? ?  ? OT LONG TERM GOAL #1  ? Title Pt will verbalize understanding of ways to prevent future PD related complications and PD community resources.   ? Time 12   ? Period Weeks   ? Status Achieved   ? Target Date 03/31/22   ?  ? OT LONG TERM GOAL #2  ? Title Pt will demonstrate improved ease with feeding as evidenced by decreasing PPT#2 (self feeding) by 3 secs   ? Baseline 14.19 secs   ? Time 12   ? Period Weeks   ? Status On-going   ?  ? OT LONG TERM GOAL #3  ? Title Pt will retrieve an item at 130 shoulder flexion and -20 elbow  extension with RUE.   ? Baseline 125, -25   ? Time 12   ? Period Weeks   ? Status Achieved   130,-10  ?  ? OT LONG TERM GOAL #4  ? Title Pt will demonstrate ability to write a short paragraph with 100% legibility and no significant decrease in letter size.   ? Time 12   ? Period Weeks   ? Status Achieved   ? ?  ?  ? ?  ? ? ? ? ? ? ? ? ? ? ?Patient will benefit from skilled therapeutic intervention in order to improve the following deficits and impairments:   ?Body Structure / Function / Physical Skills: ADL, Balance, Endurance, Mobility, Strength, Flexibility, UE functional use, FMC, Gait, Coordination, ROM, GMC, Decreased knowledge of precautions, Decreased knowledge of use of DME, IADL, Dexterity ?  ?  ? ? ?Visit Diagnosis: ?Abnormal posture ? ?Unsteadiness on feet ? ?Other abnormalities of gait and mobility ? ?Other lack of coordination ? ?Other symptoms and signs involving the nervous system ? ? ? ?Problem List ?Patient Active Problem List  ? Diagnosis Date Noted  ? 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 Aortic atherosclerosis ([place]) 02/01/2022  ? Irregular heart rate 01/12/2022  ? OSA (obstructive sleep apnea) 01/12/2022  ? Parkinson's disease  ([place]) 11/25/2020  ? Edema of right lower leg 11/25/2020  ? Hyperlipidemia LDL goal <70 11/25/2020  ? 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 Hearing loss 11/25/2020  ? Pityriasis rosea 11/25/2020  ? ? ?Lutie Pickler, OT ?02/23/2022, 12:43 PM ? ?[facility] ?[place], [place], [address] ?Phone: [phone]   Fax:  [phone] ? ?Name: [name] ?MRN: [id number] ?Date of Birth: 08/10/1946 ? ?

## 2022-02-23 ENCOUNTER — Ambulatory Visit: Payer: Medicare HMO | Admitting: Physical Therapy

## 2022-02-23 ENCOUNTER — Ambulatory Visit: Payer: Medicare HMO | Admitting: Occupational Therapy

## 2022-02-23 ENCOUNTER — Telehealth: Payer: Self-pay

## 2022-02-23 ENCOUNTER — Ambulatory Visit: Payer: Medicare HMO | Admitting: Speech Pathology

## 2022-02-23 DIAGNOSIS — R471 Dysarthria and anarthria: Secondary | ICD-10-CM | POA: Diagnosis not present

## 2022-02-23 DIAGNOSIS — R293 Abnormal posture: Secondary | ICD-10-CM

## 2022-02-23 DIAGNOSIS — I499 Cardiac arrhythmia, unspecified: Secondary | ICD-10-CM | POA: Diagnosis not present

## 2022-02-23 DIAGNOSIS — R278 Other lack of coordination: Secondary | ICD-10-CM | POA: Diagnosis not present

## 2022-02-23 DIAGNOSIS — Z8249 Family history of ischemic heart disease and other diseases of the circulatory system: Secondary | ICD-10-CM | POA: Diagnosis not present

## 2022-02-23 DIAGNOSIS — R29818 Other symptoms and signs involving the nervous system: Secondary | ICD-10-CM

## 2022-02-23 DIAGNOSIS — R2689 Other abnormalities of gait and mobility: Secondary | ICD-10-CM

## 2022-02-23 DIAGNOSIS — R2681 Unsteadiness on feet: Secondary | ICD-10-CM | POA: Diagnosis not present

## 2022-02-23 DIAGNOSIS — M6281 Muscle weakness (generalized): Secondary | ICD-10-CM | POA: Diagnosis not present

## 2022-02-23 MED ORDER — METOPROLOL SUCCINATE ER 25 MG PO TB24
25.0000 mg | ORAL_TABLET | Freq: Every day | ORAL | 3 refills | Status: DC
Start: 2022-02-23 — End: 2023-02-05

## 2022-02-23 NOTE — Therapy (Signed)
[facility] ?[address] ?[place], [place], [address] ?Phone: [phone]   Fax:  [phone] ? ?Occupational Therapy Treatment ? ?Patient Details  ?Name: [name] ?MRN: [id number] ?Date of Birth: Jan 29, 1946 ?Referring Provider (OT): Dr. Rexene Alberts ? ? ?Encounter Date: 02/23/2022 ? ? OT End of Session - 02/23/22 1536   ? ? Visit Number 13   ? Number of Visits 25   ? Date for OT Re-Evaluation 03/31/22   ? Authorization Type [employer]   ? Authorization Time Period 60 days- POC written for 12 weeks anticiapte d/c following 6-8 weeks   ? Authorization - Visit Number 13   ? Progress Note Due on Visit 20   ? OT Start Time 1535   ? OT Stop Time 1615   ? OT Time Calculation (min) 40 min   ? ?  ?  ? ?  ? ? ?Past Medical History:  ?Diagnosis Date  ? Agatston coronary artery calcium score greater than 400   ? Coronary calcium score of 533. This was 69th percentile for age,  ? Aortic atherosclerosis ([place])   ? Hyperlipidemia LDL goal <70   ? Parkinson disease ([place])   ? ? ?Past Surgical History:  ?Procedure Laterality Date  ? TONSILLECTOMY    ? ? ?There were no vitals filed for this visit. ? ? Subjective Assessment - 02/23/22 1540   ? ? Subjective  Pt denies   ? Pertinent History PD diganosed in 2019   cellulitis, lymphedema, low back pain, hyperlipidemia,   ? Patient Stated Goals mobility, maintain independence   ? Currently in Pain? No/denies   ? ?  ?  ? ?  ? ? ? ? ? ? ? ? ? ? ?Treatment: Closed chain shoulder flexion, diagonals in supine, min v.c ? ?Prone for PWR! Step, rock and twist, min facilitation/ v.c ?Issued PWR ! Twist in prone / sidelying for Home ? ?Arm bike x 5 mins level 1 for conditioning, pt maintained >40 rpm  ?Fine motor coordination placing and removing grooved pegs from pegboard with in hand manipulation, min difficulty. ?Rotating 2 golf balls in hand each direction, min difficulty, v.c  ? ? ? ? ? ? ? ? ? ? ? ? ? ? OT Short Term Goals - 02/23/22 1243    ? ?  ? OT SHORT TERM GOAL #1  ? Title I with PD specific HEP   ? Time 4   ? Period Weeks   ? Status Achieved   ? Target Date 02/03/22   ?  ? OT SHORT TERM GOAL #2  ? Title Pt will verbalize understanding of adapted strategies to maximize safety and I with ADLs/ IADLs .   ? Time 4   ? Period Weeks   ? Status On-going   needs reinforcement  ?  ? OT SHORT TERM GOAL #3  ? Title Pt will demonstrate improved fine motor coordination for ADLs as evidenced by decreasing 9 hole peg test score for RUE by 3 secs   ? Baseline R 38.25, L 33.75   ? Time 4   ? Period Weeks   ? Status Achieved   01/31/22:  R-27.62sec, L-29.53sec  ?  ? OT SHORT TERM GOAL #4  ? Title Pt will demonstrated improved LUE functional use as evidenced by increasing box/ blocks by 3 blocks   ? Baseline R 45, L 39   ? Time 4   ? Period Weeks   ? Status Achieved  01/31/22:  R-46 blocks, L-43 blocks  ?  ? OT SHORT TERM GOAL #5  ? Title Pt will verbalize understanding of ways to keep thinking skills sharp.   ? Time 4   ? Period Weeks   ? Status Achieved   ? ?  ?  ? ?  ? ? ? ? OT Long Term Goals - 02/23/22 1242   ? ?  ? OT LONG TERM GOAL #1  ? Title Pt will verbalize understanding of ways to prevent future PD related complications and PD community resources.   ? Time 12   ? Period Weeks   ? Status Achieved   ? Target Date 03/31/22   ?  ? OT LONG TERM GOAL #2  ? Title Pt will demonstrate improved ease with feeding as evidenced by decreasing PPT#2 (self feeding) by 3 secs   ? Baseline 14.19 secs   ? Time 12   ? Period Weeks   ? Status On-going   ?  ? OT LONG TERM GOAL #3  ? Title Pt will retrieve an item at 130 shoulder flexion and -20 elbow extension with RUE.   ? Baseline 125, -25   ? Time 12   ? Period Weeks   ? Status Achieved   130,-10  ?  ? OT LONG TERM GOAL #4  ? Title Pt will demonstrate ability to write a short paragraph with 100% legibility and no significant decrease in letter size.   ? Time 12   ? Period Weeks   ? Status Achieved   ? ?  ?  ? ?   ? ? ? ? ? ? ? ? Plan - 02/23/22 1542   ? ? Clinical Impression Statement Pt is progressing towards goals. He demonstrates improved posture and performance of functional activities.   ? OT Occupational Profile and History Detailed Assessment- Review of Records and additional review of physical, cognitive, psychosocial history related to current functional performance   ? Occupational performance deficits (Please refer to evaluation for details): ADL's;IADL's;Leisure;Play;Social Participation   ? Body Structure / Function / Physical Skills ADL;Balance;Endurance;Mobility;Strength;Flexibility;UE functional use;FMC;Gait;Coordination;ROM;GMC;Decreased knowledge of precautions;Decreased knowledge of use of DME;IADL;Dexterity   ? Rehab Potential Good   ? Clinical Decision Making Limited treatment options, no task modification necessary   ? Comorbidities Affecting Occupational Performance: May have comorbidities impacting occupational performance   ? Modification or Assistance to Complete Evaluation  No modification of tasks or assist necessary to complete eval   ? OT Frequency 2x / week   ? OT Duration 12 weeks   ? OT Treatment/Interventions Self-care/ADL training;Therapeutic exercise;Functional Mobility Training;Balance training;Aquatic Therapy;Ultrasound;Neuromuscular education;Splinting;Manual Therapy;Therapeutic activities;DME and/or AE instruction;Cognitive remediation/compensation;Gait Training;Moist Heat;Fluidtherapy;Paraffin;Cryotherapy;Passive range of motion;Patient/family education   ? Plan start checking goals, anticipate d/c next week, schedule screens   ? Consulted and Agree with Plan of Care Patient   ? ?  ?  ? ?  ? ? ?Patient will benefit from skilled therapeutic intervention in order to improve the following deficits and impairments:   ?Body Structure / Function / Physical Skills: ADL, Balance, Endurance, Mobility, Strength, Flexibility, UE functional use, FMC, Gait, Coordination, ROM, GMC, Decreased  knowledge of precautions, Decreased knowledge of use of DME, IADL, Dexterity ?  ?  ? ? ?Visit Diagnosis: ?Abnormal posture ? ?Other abnormalities of gait and mobility ? ?Other lack of coordination ? ?Other symptoms and signs involving the nervous system ? ? ? ?Problem List ?Patient Active Problem List  ? Diagnosis Date Noted  ? Agatston coronary artery  calcium score greater than 400 02/02/2022  ? Aortic atherosclerosis ([place]) 02/01/2022  ? Irregular heart rate 01/12/2022  ? OSA (obstructive sleep apnea) 01/12/2022  ? Parkinson's disease ([place]) 11/25/2020  ? Edema of right lower leg 11/25/2020  ? Hyperlipidemia LDL goal <70 11/25/2020  ? 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 Hearing loss 11/25/2020  ? Pityriasis rosea 11/25/2020  ? ? ?Lilyan Prete, OT ?02/23/2022, 3:44 PM ? ?[facility] ?[address] ?[place], [place], [address] ?Phone: [phone]   Fax:  [phone] ? ?Name: SHANT HENCE ?MRN: [id number] ?Date of Birth: 1946/03/14 ? ?

## 2022-02-23 NOTE — Telephone Encounter (Signed)
The patient's wife has been notified of the result and verbalized understanding.  All questions (if any) were answered. ?Antonieta Iba, RN 02/23/2022 1:48 PM  ?Rx has been sent in. Follow up has been scheduled.  ?

## 2022-02-23 NOTE — Therapy (Signed)
?OUTPATIENT PHYSICAL THERAPY TREATMENT NOTE ? ? ?Patient Name: [name] ?MRN: [id number] ?DOB:15-May-1946, 76 y.o., male ?Today's Date: 02/23/2022 ? ?PCP: Lesleigh Noe, MD ?REFERRING PROVIDER: Star Age, MD  ? ? PT End of Session - 02/23/22 1450   ? ? Visit Number 15   ? Number of Visits 25   Plus eval  ? Date for PT Re-Evaluation 03/30/22   ? Authorization Type [employer]   ? Authorization Time Period 01/05/22 - 03/30/22; [place] Approved 16 PT visits from 02/20/2022-05/15/2022   ? Progress Note Due on Visit 10   ? PT Start Time 0093   ? PT Stop Time 1531   ? PT Time Calculation (min) 44 min   ? Equipment Utilized During Treatment Gait belt   ? Activity Tolerance Patient tolerated treatment well   ? Behavior During Therapy [facility] for tasks assessed/performed   ? ?  ?  ? ?  ? ? ? ? ? ? ?Past Medical History:  ?Diagnosis Date  ? Agatston coronary artery calcium score greater than 400   ? Coronary calcium score of 533. This was 69th percentile for age,  ? Aortic atherosclerosis ([place])   ? Hyperlipidemia LDL goal <70   ? Parkinson disease ([place])   ? ?Past Surgical History:  ?Procedure Laterality Date  ? TONSILLECTOMY    ? ?Patient Active Problem List  ? Diagnosis Date Noted  ? 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 Aortic atherosclerosis ([place]) 02/01/2022  ? Irregular heart rate 01/12/2022  ? OSA (obstructive sleep apnea) 01/12/2022  ? Parkinson's disease ([place]) 11/25/2020  ? Edema of right lower leg 11/25/2020  ? Hyperlipidemia LDL goal <70 11/25/2020  ? 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 Hearing loss 11/25/2020  ? Pityriasis rosea 11/25/2020  ? ? ?REFERRING DIAG: G20 (ICD-10-CM) - Parkinson's disease ([place])  ? ?THERAPY DIAG:  ?Unsteadiness on feet ? ?Abnormal posture ? ?Other abnormalities of gait and mobility ? ?PERTINENT HISTORY: HLD, cellulitis, lymphedema, low back pain and hearing loss. ? ?PRECAUTIONS: Fall ? ?SUBJECTIVE: Pt reports things are going well, no new changes.  Still having difficulty w/CPAP mask affecting sleep.  ? ?PAIN:  ?Are you having pain? No ? ? ?TODAY'S TREATMENT:  ?Self-care/home management  ?Discussed POC moving forward and pt decided to add 8 more PT appointments. Scheduled PT appointments with pt and wife.  ? ?Gait Training  ?The following treadmill training was completed for aerobic/neural priming, endurance, and gait speed.  ?- Warmup: 3:00 up to 2.0 mph ?- HIIT: 4:00 30sec ON/OFF alternating ball kicks and normal walking at 2.0 mph w/2% incline  ?-Cooldown: 2:00 at 2.0 mph ? ?NMR ?Alt fwd lunge w/OH slam ball and catch x115' w/2KG yellow ball for improved postural control, anticipatory stepping and high amplitude anterior weight shift. Min guard throughout for anterior LOB which pt able to self-correct. Min cues for improved step length and upright posture, as pt began to festinate due to forward lean. Pt able to stop and reset throughout to maintain balance and posture.  ? ? ?PATIENT EDUCATION: ?Education details: Updated HEP, POC moving forward  ?Person educated: Patient ?Education method: Explanation  ?Education comprehension: verbalized understanding ? ? ?HOME EXERCISE PROGRAM: ?Access Code: [email or web address] ?URL: [email or web address] ?Date: 02/16/2022 ?Prepared by: Mickie Bail Ansen Sayegh ? ?Exercises ?- Wall push up with strong push away  - 1 x daily - 7 x weekly - 3 sets - 10 reps ?- Quadruped Thoracic Rotation - Reach Under  - 1 x daily -  7 x weekly - 3 sets - 10 reps ?- Half-Kneeling Forward Lean  - 1 x daily - 7 x weekly - 3 sets - 10 reps ?- Quadruped rock back  - 1 x daily - 7 x weekly - 3 sets - 10 reps ?- Dead Bug  - 1 x daily - 7 x weekly - 3 sets - 10 reps ?- Quadruped Ambulance person  - 1 x daily - 7 x weekly - 3 sets - 10 reps ?- Modified Superman on Table  - 1 x daily - 7 x weekly - 3 sets - 10 reps - 2second hold ? ? ? ? PT Short Term Goals - 01/06/22 0806   ? ?  ? PT SHORT TERM GOAL #1  ? Title Pt will be independent with initial HEP with  wife's supervision in order to build upon functional gains made in therapy. ALL STGS DUE 02/03/22   ? Time 4   ? Period Weeks   ? Status Achieved   ? Target Date 02/03/22   ?  ? PT SHORT TERM GOAL #2  ? Title Pt will maintain gait speed of 2.8 ft/sec with LRAD with supervision in order to demo improved community ambulation.   ? Baseline 2.77 ft/sec with no AD - min guard; 3.3 ft/s without RW and 3.23 ft/s with AD  ? Time 4   ? Period Weeks   ? Status Achieved   ?  ? PT SHORT TERM GOAL #3  ? Title Pt will improve 5x sit <> stand with no UE support to 19 seconds or less in order to demo improved functional strength/decr fall risk.   ? Baseline 22.97 seconds, 12.5s on 3/21    ? Time 4   ? Period Weeks   ? Status Achieved   ?  ? PT SHORT TERM GOAL #4  ? Title Pt will ambulate at least 230' over level indoor surfaces with LRAD with supervision for improved safety with mobility.   ? Baseline no AD- needs min guard and min A for balance at times; no AD, CGA   ? Time 4   ? Period Weeks   ? Status Acheived   ?  ? PT SHORT TERM GOAL #5  ? Title Pt and pt's wife will verbalize understanding of fall prevention in the home.   ? Time 4   ? Period Weeks   ? Status Achieved   ? ?  ?  ? ?  ? ? ? PT Long Term Goals - 01/06/22 0818   ? ?  ? PT LONG TERM GOAL #1  ? Title Pt will be independent with final HEP with wife's supervision in order to build upon functional gains made in therapy. ALL LTGS DUE 03/03/22   ? Time 8   ? Period Weeks   ? Status New   ? Target Date 03/03/22   ?  ? PT LONG TERM GOAL #2  ? Title Pt will improve miniBEST to at least a 18/28 in order to demo decr fall risk.   ? Baseline 12/28   ? Time 8   ? Period Weeks   ? Status New   ?  ? PT LONG TERM GOAL #3  ? Title Pt will improve 5x sit <> stand with no UE support to 15 seconds or less in order to demo improved functional strength/decr fall risk.   ? Baseline 22.97 seconds   ? Time 8   ? Period Weeks   ?  Status New   ?  ? PT LONG TERM GOAL #4  ? Title Pt will  ambulate at least 400' over outdoor paved surfaces with LRAD with mod I in order to demo improved community mobility.   ? Time 8   ? Period Weeks   ? Status New   ?  ? PT LONG TERM GOAL #5  ? Title Pt will verbalize understanding of local Parkinson's disease resources,   ? Time 8   ? Period Weeks   ? Status New   ?  ? Additional Long Term Goals  ? Additional Long Term Goals Yes   ?  ? PT LONG TERM GOAL #6  ? Title Pt will recover posterior and anterior balance in push and release test in 2 steps independently, for improved balance recovery   ? Time 8   ? Period Weeks   ? Status New   ? ?  ?  ? ?  ? ? ? Plan - 01/26/22 1458   ? ? Clinical Impression Statement Emphasis of skilled PT session on gait training,  dual-tasking and increased step length. Pt able to clear bilateral feet on treadmill during ball kicks while on steeper incline but demonstrated increased forward lean w/incline. Pt continues to demonstrate difficulty w/postural control and dual-tasking but improves quality of movement with added practice. Pt easily frustrated with performance but responds well to encouragement. Continue POC.    ? Personal Factors and Comorbidities Age;Fitness;Past/Current Experience;Comorbidity 1;Time since onset of injury/illness/exacerbation   ? Comorbidities Cellulitis   ? Examination-Activity Limitations Bend;Carry;Reach Overhead;Locomotion Level;Lift;Stairs;Squat;Stand;Transfers   ? Examination-Participation Restrictions Community Activity   ? Stability/Clinical Decision Making Evolving/Moderate complexity   ? Rehab Potential Good   ? PT Frequency 2x / week   ? PT Duration 12 weeks   ? PT Treatment/Interventions ADLs/Self Care Home Management;Gait training;DME Instruction;Stair training;Functional mobility training;Therapeutic activities;Therapeutic exercise;Balance training;Neuromuscular re-education;Cognitive remediation;Patient/family education;Energy conservation;Vestibular   ? PT Next Visit Plan How is new HEP? TM  training, boxing progression, DL progression, rebounder stepping, clock drill, boom whackers, walk outside, plyometrics (squat/star jumps), alt lunges w/slam ball retro w/dual-task, bird dogs w/dual task.  ? PT Home

## 2022-02-23 NOTE — Therapy (Signed)
[facility] ?[place] ?[place], [place], [address] ?Phone: [phone]   Fax:  [phone] ? ?Speech Language Pathology Treatment ? ?Patient Details  ?Name: [name] ?MRN: [id number] ?Date of Birth: February 19, 1946 ?Referring Provider (SLP): Dr. Rexene Alberts ? ? ?Encounter Date: 02/23/2022 ? ? End of Session - 02/23/22 1346   ? ? Visit Number 14   ? Number of Visits 25   ? Date for SLP Re-Evaluation 04/07/22   ? SLP Start Time 1400   ? SLP Stop Time  1443   ? SLP Time Calculation (min) 43 min   ? Activity Tolerance Patient tolerated treatment well   ? ?  ?  ? ?  ? ? ?Past Medical History:  ?Diagnosis Date  ? Agatston coronary artery calcium score greater than 400   ? Coronary calcium score of 533. This was 69th percentile for age,  ? Aortic atherosclerosis ([place])   ? Hyperlipidemia LDL goal <70   ? Parkinson disease ([place])   ? ? ?Past Surgical History:  ?Procedure Laterality Date  ? TONSILLECTOMY    ? ? ?There were no vitals filed for this visit. ? ? Subjective Assessment - 02/23/22 1348   ? ? Subjective "It's a beautiful day."   ? Currently in Pain? No/denies   ? ?  ?  ? ?  ? ? ? ? ? ? ? ? ADULT SLP TREATMENT - 02/23/22 0001   ? ?  ? General Information  ? Behavior/Cognition Alert;Cooperative;Pleasant mood   ?  ? Treatment Provided  ? Treatment provided Cognitive-Linquistic   ?  ? Cognitive-Linquistic Treatment  ? 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 Skilled Treatment Target increasing vocal intensity during unstructed speech tasks this date. Following initial ST instruction for calibrating target volume, pt able to demonstrate intermittant success in unstructured conversation with occasional min-A, achieves average of 65 dB. Increasing awareness observed with pt self ID decrease in intensity 50% of occurances. ST turns on radio to cue pt to speak over and pt demosntrates increased vocal intensity of 68 dB with rare min-A.   ?  ? Assessment /  Recommendations / Plan  ? Plan Continue with current plan of care   ?  ? Progression Toward Goals  ? Progression toward goals Progressing toward goals   ? ?  ?  ? ?  ? ? ? ? ? SLP Short Term Goals - 02/23/22 1347   ? ?  ? SLP SHORT TERM GOAL #1  ? Title pt will produce loud "ah" averaging 90 dB over 5 trials with rare min A across 2 sessions   ? Baseline 01/24/22; 01/31/22   ? Status Achieved   ?  ? SLP SHORT TERM GOAL #2  ? Title pt will average 70-72 dB across 5 minute conversational sample   ? Baseline 02/02/22 increased average to high 60s with usual min A   ? Status Partially Met   ?  ? SLP SHORT TERM GOAL #3  ? Title pt with average 70-72 dB at sentence level 18/20 sentences over 2 sessions   ? Baseline 01/24/22; 01/31/22   ? Status Achieved   ?  ? SLP SHORT TERM GOAL #4  ? Title pt will have 10 or less throat clears over 45 minute session   ? Baseline 01/24/22; 02/02/22   ? Status Achieved   ? ?  ?  ? ?  ? ? ? SLP Long Term Goals - 02/23/22 1347   ? ?  ?  SLP LONG TERM GOAL #1  ? Title pt will average 90 dB with x5 loud "ah" mod I over 3 sessions   ? Time 5   ? Period Weeks   ? Status On-going   ?  ? SLP LONG TERM GOAL #2  ? Title pt will average 70-72 dB over 20 minute conversation with rare min A   ? Time 5   ? Period Weeks   ? Status On-going   ?  ? SLP LONG TERM GOAL #3  ? Title pt/spouse report 50% decrease in requests for repetition subjectively at home   ? Time 5   ? Period Weeks   ? Status On-going   ?  ? SLP LONG TERM GOAL #4  ? Title pt/spouse will report compliance with vocal hygiene recommendations over 1 week period   ? Time 5   ? Period Weeks   ? Status On-going   ? ?  ?  ? ?  ? ? ? Plan - 02/23/22 1346   ? ? Clinical Impression Statement [name] presents with reduced, yet improving, vocal intensity/volume and hoarseness during speech 2/2 Parkinson's.  Pt demonstrating improvements in vocal intensity and vocal quality during structured speech tasks in therapy, requires usual mod A to sustain during  spontanous productions. Pt's spontaneous speech averages in low 60s dB. 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   ? Speech Therapy Frequency 2x / week   ? Duration 12 weeks   ? Treatment/Interventions Functional tasks;Cueing hierarchy;Environmental controls;Patient/family education;SLP instruction and feedback;Internal/external aids;Compensatory strategies;Compensatory techniques   ? Potential to Achieve Goals Good   ? Potential Considerations Medical prognosis   ? SLP Home Exercise Plan Speak Out! exercises   ? Consulted and Agree with Plan of Care Patient;Family member/caregiver   ? Family Member Consulted wife, Michelle   ? ?  ?  ? ?  ? ? ?Patient will benefit from skilled therapeutic intervention in order to improve the following deficits and impairments:   ?Dysarthria ? ? ? ?Problem List ?Patient Active Problem List  ? Diagnosis Date Noted  ? 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 Aortic atherosclerosis ([facility]) 02/01/2022  ? Irregular heart rate 01/12/2022  ? OSA (obstructive sleep apnea) 01/12/2022  ? Parkinson's disease ([facility]) 11/25/2020  ? Edema of right lower leg 11/25/2020  ? Hyperlipidemia LDL goal <70 11/25/2020  ? 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 Hearing loss 11/25/2020  ? Pityriasis rosea 11/25/2020  ? ? ?Jessica L Thomas, CCC-SLP ?02/23/2022, 3:34 PM ? ?[facility] ?[address] ?[place], [place], [address] ?Phone: [phone]   Fax:  [phone] ? ? ?Name: [name] ?MRN: [id number] ?Date of Birth: 10/27/1946 ? ?

## 2022-02-23 NOTE — Telephone Encounter (Signed)
-----   Message from Sueanne Margarita, MD sent at 02/23/2022  1:19 PM EDT ----- ?Heart monitor showed NSR with episodes of atrial tachycardia up to 46 seconds in duration.  Please start Toprol XL '25mg'$  daily and followup in 4 weeks ?

## 2022-02-28 ENCOUNTER — Ambulatory Visit: Payer: Medicare HMO | Admitting: Occupational Therapy

## 2022-02-28 ENCOUNTER — Ambulatory Visit: Payer: Medicare HMO | Admitting: Physical Therapy

## 2022-02-28 ENCOUNTER — Ambulatory Visit: Payer: Medicare HMO | Admitting: Speech Pathology

## 2022-02-28 DIAGNOSIS — M6281 Muscle weakness (generalized): Secondary | ICD-10-CM | POA: Diagnosis not present

## 2022-02-28 DIAGNOSIS — R278 Other lack of coordination: Secondary | ICD-10-CM

## 2022-02-28 DIAGNOSIS — R293 Abnormal posture: Secondary | ICD-10-CM | POA: Diagnosis not present

## 2022-02-28 DIAGNOSIS — R471 Dysarthria and anarthria: Secondary | ICD-10-CM | POA: Diagnosis not present

## 2022-02-28 DIAGNOSIS — R2681 Unsteadiness on feet: Secondary | ICD-10-CM

## 2022-02-28 DIAGNOSIS — R2689 Other abnormalities of gait and mobility: Secondary | ICD-10-CM

## 2022-02-28 DIAGNOSIS — R29818 Other symptoms and signs involving the nervous system: Secondary | ICD-10-CM

## 2022-02-28 NOTE — Therapy (Signed)
?[facility] ?[address], [place], [address] ?Phone: [phone]   Fax:  [phone] ? ?Occupational Therapy Treatment ? ?Patient Details  ?Name: [name] ?MRN: [id number] ?Date of Birth: [birth year] ?Referring Provider (OT): Dr. Rexene Alberts ? ? ?Encounter Date: 02/28/2022 ? ? OT End of Session - 02/28/22 1349   ? ? Visit Number 14   ? Number of Visits 25   ? Date for OT Re-Evaluation 03/31/22   ? Authorization Type [employer]   ? Authorization Time Period 60 days- POC written for 12 weeks anticiapte d/c following 6-8 weeks   ? Authorization - Visit Number 14   ? Progress Note Due on Visit 20   ? ?  ?  ? ?  ? ? ?Past Medical History:  ?Diagnosis Date  ? Agatston coronary artery calcium score greater than 400   ? Coronary calcium score of 533. This was 69th percentile for age,  ? Aortic atherosclerosis ([place])   ? Hyperlipidemia LDL goal <70   ? Parkinson disease ([place])   ? ? ?Past Surgical History:  ?Procedure Laterality Date  ? TONSILLECTOMY    ? ? ?There were no vitals filed for this visit. ? ? Subjective Assessment - 02/28/22 1357   ? ? Subjective  Pt denies   ? Pertinent History PD diganosed in 2019   cellulitis, lymphedema, low back pain, hyperlipidemia,   ? Patient Stated Goals mobility, maintain independence   ? Currently in Pain? No/denies   ? ?  ?  ? ?  ? ? ? ? ? ? ? ? ? ?Treatment: Arm bike x 6 mins level 1 for conditioning, pt maintained > 40 rpm ?Dynamic stepping to follow directions , min v.c for amplitude ?Purdue pegboard to place pegs with bilateral UE's simultaneously, for increased fine motor coordination ? ? ? ? ? ? ? ? ? ? ? ? ? OT Education - 02/28/22 1357   ? ? Education Details PWR! moves basic 4 in seated,  followed by PWR! up, rock and step in standing, min v.c for amplitude   ? Person(s) Educated Patient   ? Methods Explanation;Demonstration;Verbal cues;Tactile cues   ? ?  ?  ? ?  ? ? ? OT Short Term Goals - 02/23/22 1604   ? ?  ?  OT SHORT TERM GOAL #1  ? Title I with PD specific HEP   ? Time 4   ? Period Weeks   ? Status Achieved   ? Target Date 02/03/22   ?  ? OT SHORT TERM GOAL #2  ? Title Pt will verbalize understanding of adapted strategies to maximize safety and I with ADLs/ IADLs .   ? Time 4   ? Period Weeks   ? Status On-going   needs reinforcement  ?  ? OT SHORT TERM GOAL #3  ? Title Pt will demonstrate improved fine motor coordination for ADLs as evidenced by decreasing 9 hole peg test score for RUE by 3 secs   ? Baseline R 38.25, L 33.75   ? Time 4   ? Period Weeks   ? Status Achieved   01/31/22:  R-27.62sec, L-29.53sec  ?  ? OT SHORT TERM GOAL #4  ? Title Pt will demonstrated improved LUE functional use as evidenced by increasing box/ blocks by 3 blocks   ? Baseline R 45, L 39   ? Time 4   ? Period Weeks   ? Status Achieved   01/31/22:  R-46  blocks, L-43 blocks  ?  ? OT SHORT TERM GOAL #5  ? Title Pt will verbalize understanding of ways to keep thinking skills sharp.   ? Time 4   ? Period Weeks   ? Status Achieved   ? ?  ?  ? ?  ? ? ? ? OT Long Term Goals - 02/23/22 1604   ? ?  ? OT LONG TERM GOAL #1  ? Title Pt will verbalize understanding of ways to prevent future PD related complications and PD community resources.   ? Time 12   ? Period Weeks   ? Status Achieved   ? Target Date 03/31/22   ?  ? OT LONG TERM GOAL #2  ? Title Pt will demonstrate improved ease with feeding as evidenced by decreasing PPT#2 (self feeding) by 3 secs   ? Baseline 14.19 secs   ? Time 12   ? Period Weeks   ? Status On-going   ?  ? OT LONG TERM GOAL #3  ? Title Pt will retrieve an item at 130 shoulder flexion and -20 elbow extension with RUE.   ? Baseline 125, -25   ? Time 12   ? Period Weeks   ? Status Achieved   130,-10  ?  ? OT LONG TERM GOAL #4  ? Title Pt will demonstrate ability to write a short paragraph with 100% legibility and no significant decrease in letter size.   ? Time 12   ? Period Weeks   ? Status Achieved   ? ?  ?  ? ?   ? ? ? ? ? ? ? ? ? ?Patient will benefit from skilled therapeutic intervention in order to improve the following deficits and impairments:   ?  ?  ?  ? ? ?Visit Diagnosis: ?Other abnormalities of gait and mobility ? ?Muscle weakness (generalized) ? ?Abnormal posture ? ?Other lack of coordination ? ?Other symptoms and signs involving the nervous system ? ?Unsteadiness on feet ? ? ? ?Problem List ?Patient Active Problem List  ? Diagnosis Date Noted  ? 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 Aortic atherosclerosis ([place]) 02/01/2022  ? Irregular heart rate 01/12/2022  ? OSA (obstructive sleep apnea) 01/12/2022  ? Parkinson's disease ([place]) 11/25/2020  ? Edema of right lower leg 11/25/2020  ? Hyperlipidemia LDL goal <70 11/25/2020  ? 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 Hearing loss 11/25/2020  ? Pityriasis rosea 11/25/2020  ? ? ?Raistlin Gum, OT ?02/28/2022, 1:59 PM ? ?[facility] ?[place] ?[place], [place], [address] ?Phone: [phone]   Fax:  [phone] ? ?Name: SUMEET GETER ?MRN: [id number] ?Date of Birth: 1946-08-31 ? ?

## 2022-02-28 NOTE — Therapy (Signed)
?OUTPATIENT PHYSICAL THERAPY TREATMENT NOTE ? ? ?Patient Name: [name] ?MRN: [id number] ?DOB:August 02, 1946, 76 y.o., male ?Today's Date: 02/28/2022 ? ?PCP: Lesleigh Noe, MD ?REFERRING PROVIDER: Star Age, MD  ? ? PT End of Session - 02/28/22 1235   ? ? Visit Number 16   ? Number of Visits 25   Plus eval  ? Date for PT Re-Evaluation 03/30/22   ? Authorization Type [employer]   ? Authorization Time Period 01/05/22 - 03/30/22; [employer] Approved 16 PT visits from 02/20/2022-05/15/2022   ? Progress Note Due on Visit 20   ? PT Start Time 1232   ? PT Stop Time 1318   ? PT Time Calculation (min) 46 min   ? Equipment Utilized During Treatment Gait belt   ? Activity Tolerance Patient tolerated treatment well   ? Behavior During Therapy [facility] for tasks assessed/performed   ? ?  ?  ? ?  ? ? ? ? ? ? ? ?Past Medical History:  ?Diagnosis Date  ? Agatston coronary artery calcium score greater than 400   ? Coronary calcium score of 533. This was 69th percentile for age,  ? Aortic atherosclerosis ([place])   ? Hyperlipidemia LDL goal <70   ? Parkinson disease ([place])   ? ?Past Surgical History:  ?Procedure Laterality Date  ? TONSILLECTOMY    ? ?Patient Active Problem List  ? Diagnosis Date Noted  ? 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 Aortic atherosclerosis ([place]) 02/01/2022  ? Irregular heart rate 01/12/2022  ? OSA (obstructive sleep apnea) 01/12/2022  ? Parkinson's disease ([place]) 11/25/2020  ? Edema of right lower leg 11/25/2020  ? Hyperlipidemia LDL goal <70 11/25/2020  ? 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 Hearing loss 11/25/2020  ? Pityriasis rosea 11/25/2020  ? ? ?REFERRING DIAG: G20 (ICD-10-CM) - Parkinson's disease ([place])  ? ?THERAPY DIAG:  ?Unsteadiness on feet ? ?Other abnormalities of gait and mobility ? ?Muscle weakness (generalized) ? ?PERTINENT HISTORY: HLD, cellulitis, lymphedema, low back pain and hearing loss. ? ?PRECAUTIONS: Fall ? ?SUBJECTIVE: Pt reports things are going well, no  new changes. Still having difficulty w/CPAP mask affecting sleep. Floor exercises still very tough.  ? ?PAIN:  ?Are you having pain? No ? ? ?TODAY'S TREATMENT:  ?Gait Training  ?The following treadmill training was completed for aerobic/neural priming, endurance, and gait speed.  ?- Warmup: 2:00 up to 2.0 mph ?- HIIT: 5:00 30sec ON/OFF alternating 2.0 / 2.9 mph   ?-Cool down: 2:00 at 1.8 mph  ? ? ?NMR  ? [facility] PT Assessment - 02/28/22 1252   ? ?  ? Mini-BESTest  ? Sit To Stand Normal: Comes to stand without use of hands and stabilizes independently.   ? Rise to Toes Moderate: Heels up, but not full range (smaller than when holding hands), OR noticeable instability for 3 s.   ? Stand on one leg (left) Moderate: < 20 s   1s  ? Stand on one leg (right) Moderate: < 20 s   1s  ? Stand on one leg - lowest score 1   ? Compensatory Stepping Correction - Forward Normal: Recovers independently with a single, large step (second realignement is allowed).   ? Compensatory Stepping Correction - Backward Moderate: More than one step is required to recover equilibrium   ? Compensatory Stepping Correction - Left Lateral Moderate: Several steps to recover equilibrium   ? Compensatory Stepping Correction - Right Lateral Moderate: Several steps to recover equilibrium   ?  Stepping Corredtion Lateral - lowest score 1   ? Stance - Feet together, eyes open, firm surface  Normal: 30s   ? Stance - Feet together, eyes closed, foam surface  Moderate: < 30s   13.4s  ? Incline - Eyes Closed Moderate: Stands independently < 30s OR aligns with surface   Forward lean and kyphotic posture  ? Change in Gait Speed Normal: Significantly changes walkling speed without imbalance   ? Walk with head turns - Horizontal Normal: performs head turns with no change in gait speed and good balance   ? Walk with pivot turns Moderate:Turns with feet close SLOW (>4 steps) with good balance.   ? Step over obstacles Normal: Able to step over box with minimal change of  gait speed and with good balance.   ? Timed UP & GO with Dual Task Normal: No noticeable change in sitting, standing or walking while backward counting when compared to TUG without   ? Mini-BEST total score 21   ?  ? Timed Up and Go Test  ? Normal TUG (seconds) 9.41   no AD, S*  ? Cognitive TUG (seconds) 10.2   no AD, S*  ? ?  ?  ? ?  ? ? ?PATIENT EDUCATION: ?Education details: MiniBest results interpretation, POC moving forward, updating HEP next session  ?Person educated: Patient ?Education method: Explanation  ?Education comprehension: verbalized understanding ? ? ?HOME EXERCISE PROGRAM: ?Access Code: [email or web address] ?URL: [email or web address] ?Date: 02/16/2022 ?Prepared by: Mickie Bail Beau [place] ? ?Exercises ?- Wall push up with strong push away  - 1 x daily - 7 x weekly - 3 sets - 10 reps ?- Quadruped Thoracic Rotation - Reach Under  - 1 x daily - 7 x weekly - 3 sets - 10 reps ?- Half-Kneeling Forward Lean  - 1 x daily - 7 x weekly - 3 sets - 10 reps ?- Quadruped rock back  - 1 x daily - 7 x weekly - 3 sets - 10 reps ?- Dead Bug  - 1 x daily - 7 x weekly - 3 sets - 10 reps ?- Quadruped Ambulance person  - 1 x daily - 7 x weekly - 3 sets - 10 reps ?- Modified Superman on Table  - 1 x daily - 7 x weekly - 3 sets - 10 reps - 2second hold ? ? ? ? PT Short Term Goals - 01/06/22 0806   ? ?  ? PT SHORT TERM GOAL #1  ? Title Pt will be independent with initial HEP with wife's supervision in order to build upon functional gains made in therapy. ALL STGS DUE 02/03/22   ? Time 4   ? Period Weeks   ? Status Achieved   ? Target Date 02/03/22   ?  ? PT SHORT TERM GOAL #2  ? Title Pt will maintain gait speed of 2.8 ft/sec with LRAD with supervision in order to demo improved community ambulation.   ? Baseline 2.77 ft/sec with no AD - min guard; 3.3 ft/s without RW and 3.23 ft/s with AD  ? Time 4   ? Period Weeks   ? Status Achieved   ?  ? PT SHORT TERM GOAL #3  ? Title Pt will improve 5x sit <> stand with no UE support to 19  seconds or less in order to demo improved functional strength/decr fall risk.   ? Baseline 22.97 seconds, 12.5s on 3/21    ? Time 4   ? Period Weeks   ? Status  Achieved   ?  ? PT SHORT TERM GOAL #4  ? Title Pt will ambulate at least 230' over level indoor surfaces with LRAD with supervision for improved safety with mobility.   ? Baseline no AD- needs min guard and min A for balance at times; no AD, CGA   ? Time 4   ? Period Weeks   ? Status Acheived   ?  ? PT SHORT TERM GOAL #5  ? Title Pt and pt's wife will verbalize understanding of fall prevention in the home.   ? Time 4   ? Period Weeks   ? Status Achieved   ? ?  ?  ? ?  ? ? ? PT Long Term Goals - 01/06/22 0818   ? ?  ? PT LONG TERM GOAL #1  ? Title Pt will be independent with final HEP with wife's supervision in order to build upon functional gains made in therapy. ALL LTGS DUE 03/03/22   ? Time 8   ? Period Weeks   ? Status New   ? Target Date 03/03/22   ?  ? PT LONG TERM GOAL #2  ? Title Pt will improve miniBEST to at least a 18/28 in order to demo decr fall risk.   ? Baseline 12/28; 21/28   ? Time 8   ? Period Weeks   ? Status MET   ?  ? PT LONG TERM GOAL #3  ? Title Pt will improve 5x sit <> stand with no UE support to 15 seconds or less in order to demo improved functional strength/decr fall risk.   ? Baseline 22.97 seconds   ? Time 8   ? Period Weeks   ? Status New   ?  ? PT LONG TERM GOAL #4  ? Title Pt will ambulate at least 400' over outdoor paved surfaces with LRAD with mod I in order to demo improved community mobility.   ? Time 8   ? Period Weeks   ? Status New   ?  ? PT LONG TERM GOAL #5  ? Title Pt will verbalize understanding of local Parkinson's disease resources,   ? Time 8   ? Period Weeks   ? Status New   ?  ? Additional Long Term Goals  ? Additional Long Term Goals Yes   ?  ? PT LONG TERM GOAL #6  ? Title Pt will recover posterior and anterior balance in push and release test in 2 steps independently, for improved balance recovery   ? Time 8    ? Period Weeks   ? Status MET   ? ?  ?  ? ?  ? ? ? Plan - 01/26/22 1458   ? ? Clinical Impression Statement Emphasis of skilled PT session on speed training on treadmill and assessment of LTGs. Pt improv

## 2022-02-28 NOTE — Therapy (Signed)
[facility] ?[place] ?[place], [place], [address] ?Phone: [phone]   Fax:  [phone] ? ?Speech Language Pathology Treatment ? ?Patient Details  ?Name: [name] ?MRN: [id number] ?Date of Birth: [birth year] ?Referring Provider (SLP): Dr. Rexene Alberts ? ? ?Encounter Date: 02/28/2022 ? ? End of Session - 02/28/22 1226   ? ? Visit Number 15   ? Number of Visits 25   ? Date for SLP Re-Evaluation 04/07/22   ? SLP Start Time 1400   ? SLP Stop Time  1445   ? SLP Time Calculation (min) 45 min   ? Activity Tolerance Patient tolerated treatment well   ? ?  ?  ? ?  ? ? ?Past Medical History:  ?Diagnosis Date  ? Agatston coronary artery calcium score greater than 400   ? Coronary calcium score of 533. This was 69th percentile for age,  ? Aortic atherosclerosis ([place])   ? Hyperlipidemia LDL goal <70   ? Parkinson disease ([place])   ? ? ?Past Surgical History:  ?Procedure Laterality Date  ? TONSILLECTOMY    ? ? ?There were no vitals filed for this visit. ? ? Subjective Assessment - 02/28/22 1412   ? ? Subjective "I brought my didgeridoo"   ? Patient is accompained by: Family member   ? Currently in Pain? No/denies   ? ?  ?  ? ?  ? ? ? ? ? ? ? ? ADULT SLP TREATMENT - 02/28/22 1414   ? ?  ? General Information  ? Behavior/Cognition Alert;Cooperative;Pleasant mood   ?  ? Treatment Provided  ? Treatment provided Cognitive-Linquistic   ?  ? Cognitive-Linquistic Treatment  ? 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 Skilled Treatment Target improving vocal quality and intensity across conversational speech sample. Use of visual reinforcement to increase accuracy of hitting dB target of 70 dB. Occasional mod-A over 10 minute conversational sample. Assisted pt in generating locations/situations in which pt should be mindful of using intentional voice. Pt generates phone conversations, occasionally when in loud places (like bingo hall) or restaurants. Frequent throat clears  observed. Pt reports post nasal drip causing hoarseness.   ?  ? Assessment / Recommendations / Plan  ? Plan Continue with current plan of care   ?  ? Progression Toward Goals  ? Progression toward goals Progressing toward goals   ? ?  ?  ? ?  ? ? ? SLP Education - 02/28/22 1228   ? ? Education Details Using Intentional Speech; vocal hygiene recs   ? Person(s) Educated Patient   ? Methods Explanation;Demonstration;Handout   ? Comprehension Verbalized understanding;Returned demonstration;Need further instruction   ? ?  ?  ? ?  ? ? ? SLP Short Term Goals - 02/28/22 1227   ? ?  ? SLP SHORT TERM GOAL #1  ? Title pt will produce loud "ah" averaging 90 dB over 5 trials with rare min A across 2 sessions   ? Baseline 01/24/22; 01/31/22   ? Status Achieved   ?  ? SLP SHORT TERM GOAL #2  ? Title pt will average 70-72 dB across 5 minute conversational sample   ? Baseline 02/02/22 increased average to high 60s with usual min A   ? Status Partially Met   ?  ? SLP SHORT TERM GOAL #3  ? Title pt with average 70-72 dB at sentence level 18/20 sentences over 2 sessions   ? Baseline 01/24/22; 01/31/22   ? Status Achieved   ?  ?  SLP SHORT TERM GOAL #4  ? Title pt will have 10 or less throat clears over 45 minute session   ? Baseline 01/24/22; 02/02/22   ? Status Achieved   ? ?  ?  ? ?  ? ? ? SLP Long Term Goals - 02/28/22 1227   ? ?  ? SLP LONG TERM GOAL #1  ? Title pt will average 90 dB with x5 loud "ah" mod I over 3 sessions   ? Time 4   ? Period Weeks   ? Status On-going   ?  ? SLP LONG TERM GOAL #2  ? Title pt will average 70-72 dB over 20 minute conversation with rare min A   ? Time 4   ? Period Weeks   ? Status On-going   ?  ? SLP LONG TERM GOAL #3  ? Title pt/spouse report 50% decrease in requests for repetition subjectively at home   ? Time 4   ? Period Weeks   ? Status On-going   ?  ? SLP LONG TERM GOAL #4  ? Title pt/spouse will report compliance with vocal hygiene recommendations over 1 week period   ? Time 4   ? Period Weeks   ?  Status On-going   ? ?  ?  ? ?  ? ? ? ? ?Patient will benefit from skilled therapeutic intervention in order to improve the following deficits and impairments:   ?Dysarthria ? ? ? ?Problem List ?Patient Active Problem List  ? Diagnosis Date Noted  ? 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 Aortic atherosclerosis ([place]) 02/01/2022  ? Irregular heart rate 01/12/2022  ? OSA (obstructive sleep apnea) 01/12/2022  ? Parkinson's disease ([place]) 11/25/2020  ? Edema of right lower leg 11/25/2020  ? Hyperlipidemia LDL goal <70 11/25/2020  ? 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 Hearing loss 11/25/2020  ? Pityriasis rosea 11/25/2020  ? ? ?Su Monks, CCC-SLP ?02/28/2022, 2:40 PM ? ?[facility] ?[address] ?[place], [place], [address] ?Phone: [phone]   Fax:  [phone] ? ? ?Name: [name] ?MRN: [id number] ?Date of Birth: Mar 22, 1946 ? ?

## 2022-03-02 ENCOUNTER — Ambulatory Visit: Payer: Medicare HMO | Admitting: Physical Therapy

## 2022-03-02 ENCOUNTER — Ambulatory Visit: Payer: Medicare HMO | Admitting: Speech Pathology

## 2022-03-02 ENCOUNTER — Encounter: Payer: Self-pay | Admitting: Physical Therapy

## 2022-03-02 ENCOUNTER — Encounter: Payer: Self-pay | Admitting: Occupational Therapy

## 2022-03-02 ENCOUNTER — Ambulatory Visit: Payer: Medicare HMO | Admitting: Occupational Therapy

## 2022-03-02 DIAGNOSIS — R278 Other lack of coordination: Secondary | ICD-10-CM

## 2022-03-02 DIAGNOSIS — M6281 Muscle weakness (generalized): Secondary | ICD-10-CM | POA: Diagnosis not present

## 2022-03-02 DIAGNOSIS — R2681 Unsteadiness on feet: Secondary | ICD-10-CM | POA: Diagnosis not present

## 2022-03-02 DIAGNOSIS — R293 Abnormal posture: Secondary | ICD-10-CM

## 2022-03-02 DIAGNOSIS — R471 Dysarthria and anarthria: Secondary | ICD-10-CM | POA: Diagnosis not present

## 2022-03-02 DIAGNOSIS — R2689 Other abnormalities of gait and mobility: Secondary | ICD-10-CM | POA: Diagnosis not present

## 2022-03-02 DIAGNOSIS — R29818 Other symptoms and signs involving the nervous system: Secondary | ICD-10-CM

## 2022-03-02 NOTE — Therapy (Signed)
[facility] ?[address] ?, [place], [address] ?Phone: [phone]   Fax:  [phone] ? ?Speech Language Pathology Treatment ? ?Patient Details  ?Name: [name] ?MRN: [id number] ?Date of Birth: 07/18/1946 ?Referring Provider (SLP): Dr. Athar ? ? ?Encounter Date: 03/02/2022 ? ? End of Session - 03/02/22 1226   ? ? Visit Number 16   ? Number of Visits 25   ? Date for SLP Re-Evaluation 04/07/22   ? SLP Start Time 1400   ? SLP Stop Time  1438   ? SLP Time Calculation (min) 38 min   ? Activity Tolerance Patient tolerated treatment well   ? ?  ?  ? ?  ? ? ?Past Medical History:  ?Diagnosis Date  ? Agatston coronary artery calcium score greater than 400   ? Coronary calcium score of 533. This was 69th percentile for age,  ? Aortic atherosclerosis (HCC)   ? Hyperlipidemia LDL goal <70   ? Parkinson disease (HCC)   ? ? ?Past Surgical History:  ?Procedure Laterality Date  ? TONSILLECTOMY    ? ? ?There were no vitals filed for this visit. ? ? Subjective Assessment - 03/02/22 1352   ? ? Subjective "I'm ok"   ? Currently in Pain? No/denies   ? ?  ?  ? ?  ? ? ? ? ? ? ? ? ADULT SLP TREATMENT - 03/02/22 1228   ? ?  ? General Information  ? Behavior/Cognition Alert;Cooperative;Pleasant mood   ?  ? Treatment Provided  ? Treatment provided Cognitive-Linquistic   ?  ? Cognitive-Linquistic Treatment  ? 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 Skilled Treatment Reviewed HEP recommendations as pt is d/c this date. Target carryover of intentional speech at conversational level. Pt requires occasional min-A to meet targets in unstructred conversation. Averages 69 dB. Complex cogntitive task, able to avhieve target 70-72 dB with rare min-A. Averages 72 dB. Re-education provided on throat clearing cessation. Pt continues to demonstrate habitual throat clearing. Does have increased awareness. Pt verbalizes "being mindful" as helpful  cornerstone to focus his HEP on upon d/c.   ?  ? Assessment / Recommendations / Plan  ? Plan Discharge SLP treatment due to (comment)   patient please with current function  ?  ? Progression Toward Goals  ? Progression toward goals Goals partially met, education completed, patient discharged from SLP   ? ?  ?  ? ?  ? ? ? ? ? SLP Short Term Goals - 03/02/22 1227   ? ?  ? SLP SHORT TERM GOAL #1  ? Title pt will produce loud "ah" averaging 90 dB over 5 trials with rare min A across 2 sessions   ? Baseline 01/24/22; 01/31/22   ? Status Achieved   ?  ? SLP SHORT TERM GOAL #2  ? Title pt will average 70-72 dB across 5 minute conversational sample   ? Baseline 02/02/22 increased average to high 60s with usual min A   ? Status Partially Met   ?  ? SLP SHORT TERM GOAL #3  ? Title pt with average 70-72 dB at sentence level 18/20 sentences over 2 sessions   ? Baseline 01/24/22; 01/31/22   ? Status Achieved   ?  ? SLP SHORT TERM GOAL #4  ? Title pt will have 10 or less throat clears over 45 minute session   ? Baseline 01/24/22; 02/02/22   ? Status Achieved   ? ?  ?  ? ?  ? ? ?   SLP Long Term Goals - 03/02/22 1227   ? ?  ? SLP LONG TERM GOAL #1  ? Title pt will average 90 dB with x5 loud "ah" mod I over 3 sessions   ? Baseline 02-28-22, 03-02-2022   ? Period Weeks   ? Status Achieved   ?  ? SLP LONG TERM GOAL #2  ? Title pt will average 70-72 dB over 20 minute conversation with rare min A   ? Baseline 03-02-22   ? Status Partially Met   ?  ? SLP LONG TERM GOAL #3  ? Title pt/spouse report 50% decrease in requests for repetition subjectively at home   ? Baseline 03-02-22   ? Status Achieved   ?  ? SLP LONG TERM GOAL #4  ? Title pt/spouse will report compliance with vocal hygiene recommendations over 1 week period   ? Baseline 02-28-22, 03-02-22   ? Status Partially Met   ? ?  ?  ? ?  ? ? ? ? ?Patient will benefit from skilled therapeutic intervention in order to improve the following deficits and impairments:   ?Dysarthria ? ? ? ?Problem  List ?Patient Active Problem List  ? Diagnosis Date Noted  ? 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 Aortic atherosclerosis (HCC) 02/01/2022  ? Irregular heart rate 01/12/2022  ? OSA (obstructive sleep apnea) 01/12/2022  ? Parkinson's disease (HCC) 11/25/2020  ? Edema of right lower leg 11/25/2020  ? Hyperlipidemia LDL goal <70 11/25/2020  ? 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 Hearing loss 11/25/2020  ? Pityriasis rosea 11/25/2020  ? ? ?Jessica L Thomas, CCC-SLP ?03/02/2022, 2:40 PM ? ?[facility] ?[address] ?[place], [place], [address] ?Phone: [phone]   Fax:  [phone] ? ? ?Name: [name] ?MRN: [id number] ?Date of Birth: 01/29/1946 ? ?

## 2022-03-02 NOTE — Therapy (Signed)
?OUTPATIENT PHYSICAL THERAPY TREATMENT NOTE ? ? ?Patient Name: [name] ?MRN: [id number] ?DOB:[birth year], 76 y.o., male ?Today's Date: 03/02/2022 ? ?PCP: Lesleigh Noe, MD ?REFERRING PROVIDER: Star Age, MD  ? ? PT End of Session - 03/02/22 1235   ? ? Visit Number 17   ? Number of Visits 25   Plus eval  ? Date for PT Re-Evaluation 03/30/22   ? Authorization Type [employer]   ? Authorization Time Period 01/05/22 - 03/30/22; [employer] Approved 16 PT visits from 02/20/2022-05/15/2022   ? Progress Note Due on Visit 20   ? PT Start Time 1233   ? PT Stop Time 1313   ? PT Time Calculation (min) 40 min   ? Equipment Utilized During Treatment Gait belt   ? Activity Tolerance Patient tolerated treatment well   ? Behavior During Therapy [facility] for tasks assessed/performed   ? ?  ?  ? ?  ? ? ? ? ? ? ? ?Past Medical History:  ?Diagnosis Date  ? Agatston coronary artery calcium score greater than 400   ? Coronary calcium score of 533. This was 69th percentile for age,  ? Aortic atherosclerosis ([place])   ? Hyperlipidemia LDL goal <70   ? Parkinson disease ([place])   ? ?Past Surgical History:  ?Procedure Laterality Date  ? TONSILLECTOMY    ? ?Patient Active Problem List  ? Diagnosis Date Noted  ? 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 Aortic atherosclerosis ([place]) 02/01/2022  ? Irregular heart rate 01/12/2022  ? OSA (obstructive sleep apnea) 01/12/2022  ? Parkinson's disease ([place]) 11/25/2020  ? Edema of right lower leg 11/25/2020  ? Hyperlipidemia LDL goal <70 11/25/2020  ? 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 Hearing loss 11/25/2020  ? Pityriasis rosea 11/25/2020  ? ? ?REFERRING DIAG: G20 (ICD-10-CM) - Parkinson's disease ([place])  ? ?THERAPY DIAG:  ?Other abnormalities of gait and mobility ? ?Muscle weakness (generalized) ? ?Abnormal posture ? ?PERTINENT HISTORY: HLD, cellulitis, lymphedema, low back pain and hearing loss. ? ?PRECAUTIONS: Fall ? ?SUBJECTIVE: No changes since he was last here   ? ?PAIN:  ?Are you having pain? No ? ? ?TODAY'S TREATMENT:  ? ?5x sit <> stand with no UE support: 11.63 seconds ? ? ?GAIT: ?Gait pattern: step through pattern, decreased stride length, decreased ankle dorsiflexion- Right, decreased ankle dorsiflexion- Left, and trunk flexed ?Distance walked: 500' ?Assistive device utilized: Environmental consultant - 4 wheeled ?Level of assistance: SBA ?Comments: outdoors on paved surface w/ rollator. Cues for posture, keeping feet under the seat for incr stride length and staying closer to rollator with tall posture. Cued to gently squeeze brakes when going down inclines.  ? ?Lowered pt's rollator one notch as it was a little high and pt was demonstrating incr shoulder elevation and incr elbow flexion.  ? ? Cues to turn fully with rollator before sitting down on mat table.  ? ?NMR: ?-in corner on single pillows and feet together, EO: 2 x 5 reps head turns , 2 x 5 reps head nods. Pt more challenged by head turns and almost losing balance anteriorly. Cues for posture.  ? ?Access Code: [email or web address] ?URL: [email or web address] ?Date: 03/02/2022 ?Prepared by: Janann August ? ?Exercises ?- Wall push up with strong push away  - 1 x daily - 7 x weekly - 3 sets - 10 reps ?- Quadruped Thoracic Rotation - Reach Under  - 1 x daily - 7 x weekly - 3 sets - 10 reps ?- Half-Kneeling  Forward Lean  - 1 x daily - 7 x weekly - 3 sets - 10 reps ?- Quadruped rock back  - 1 x daily - 7 x weekly - 3 sets - 10 reps ?- Dead Bug  - 1 x daily - 7 x weekly - 3 sets - 10 reps ?- Quadruped Ambulance person  - 1 x daily - 7 x weekly - 3 sets - 10 reps ?- Modified Superman on Table  - 1 x daily - 7 x weekly - 3 sets - 10 reps - 2second hold ? ?New additions on 03/02/22: ?- Correct Standing Posture  - 1 x daily - 5 x weekly - 30 hold - against the wall  with trying to bring shoulders back, pt reporting feeling good with this stretch, performed 3 sets of 30 seconds.  ?- Standing Marching  - 1 x daily - 5 x weekly - 1-2 sets  - 10 reps - with single UE support, cues for posture and incr ROM esp with RLE  ?- Standing Balance with Eyes Closed on Foam  - 1 x daily - 5 x weekly - 3 sets - 30 hold - with feet apart on single pillow. Educated to make sure that pt's wife helped him with set up for this exercise.  ? ?PATIENT EDUCATION: ?Education details: Progress towards goals, new goals going forwards in therapy, updates to pt's HEP for standing balance.  ?Person educated: Patient ?Education method: Explanation  ?Education comprehension: verbalized understanding ? ? ?HOME EXERCISE PROGRAM: ?Access Code: [email or web address] ?URL: [email or web address] ?Date: 03/02/2022 ?Prepared by: Janann August ? ?Exercises ?- Wall push up with strong push away  - 1 x daily - 7 x weekly - 3 sets - 10 reps ?- Quadruped Thoracic Rotation - Reach Under  - 1 x daily - 7 x weekly - 3 sets - 10 reps ?- Half-Kneeling Forward Lean  - 1 x daily - 7 x weekly - 3 sets - 10 reps ?- Quadruped rock back  - 1 x daily - 7 x weekly - 3 sets - 10 reps ?- Dead Bug  - 1 x daily - 7 x weekly - 3 sets - 10 reps ?- Quadruped Ambulance person  - 1 x daily - 7 x weekly - 3 sets - 10 reps ?- Modified Superman on Table  - 1 x daily - 7 x weekly - 3 sets - 10 reps - 2second hold ?- Correct Standing Posture  - 1 x daily - 5 x weekly - 30 hold ?- Standing Marching  - 1 x daily - 5 x weekly - 1-2 sets - 10 reps ?- Standing Balance with Eyes Closed on Foam  - 1 x daily - 5 x weekly - 3 sets - 30 hold ? ? ? ? PT Short Term Goals - 01/06/22 0806   ? ?  ? PT SHORT TERM GOAL #1  ? Title Pt will be independent with initial HEP with wife's supervision in order to build upon functional gains made in therapy. ALL STGS DUE 02/03/22   ? Time 4   ? Period Weeks   ? Status Achieved   ? Target Date 02/03/22   ?  ? PT SHORT TERM GOAL #2  ? Title Pt will maintain gait speed of 2.8 ft/sec with LRAD with supervision in order to demo improved community ambulation.   ? Baseline 2.77 ft/sec with no AD - min  guard; 3.3 ft/s without RW and 3.23 ft/s with AD  ? Time  4   ? Period Weeks   ? Status Achieved   ?  ? PT SHORT TERM GOAL #3  ? Title Pt will improve 5x sit <> stand with no UE support to 19 seconds or less in order to demo improved functional strength/decr fall risk.   ? Baseline 22.97 seconds, 12.5s on 3/21    ? Time 4   ? Period Weeks   ? Status Achieved   ?  ? PT SHORT TERM GOAL #4  ? Title Pt will ambulate at least 230' over level indoor surfaces with LRAD with supervision for improved safety with mobility.   ? Baseline no AD- needs min guard and min A for balance at times; no AD, CGA   ? Time 4   ? Period Weeks   ? Status Acheived   ?  ? PT SHORT TERM GOAL #5  ? Title Pt and pt's wife will verbalize understanding of fall prevention in the home.   ? Time 4   ? Period Weeks   ? Status Achieved   ? ?  ?  ? ?  ? ? ? PT Long Term Goals - 01/06/22 0818   ? ?  ? PT LONG TERM GOAL #1  ? Title Pt will be independent with final HEP with wife's supervision in order to build upon functional gains made in therapy. ALL LTGS DUE 03/03/22   ? Time 8   ? Period Weeks   ? Status ON GOING  ? Target Date 03/03/22   ?  ? PT LONG TERM GOAL #2  ? Title Pt will improve miniBEST to at least a 18/28 in order to demo decr fall risk.   ? Baseline 12/28; 21/28   ? Time 8   ? Period Weeks   ? Status MET   ?  ? PT LONG TERM GOAL #3  ? Title Pt will improve 5x sit <> stand with no UE support to 15 seconds or less in order to demo improved functional strength/decr fall risk.   ? Baseline 22.97 seconds; 11.63 seconds on 03/02/22  ? Time 8   ? Period Weeks   ? Status MET  ?  ? PT LONG TERM GOAL #4  ? Title Pt will ambulate at least 400' over outdoor paved surfaces with LRAD with mod I in order to demo improved community mobility.   ? Time 8   ? Period Weeks   ? Status  PARTIALLY MET  ?  ? PT LONG TERM GOAL #5  ? Title Pt will verbalize understanding of local Parkinson's disease resources,   ? Time 8   ? Period Weeks   ? Status MET  ?  ?  Additional Long Term Goals  ? Additional Long Term Goals Yes   ?  ? PT LONG TERM GOAL #6  ? Title Pt will recover posterior and anterior balance in push and release test in 2 steps independently, for improved balance r

## 2022-03-02 NOTE — Therapy (Signed)
[facility] ?[place], [place], [address] ?Phone: [phone]   Fax:  [phone] ? ?Occupational Therapy Treatment ? ?Patient Details  ?Name: [name] ?MRN: [id number] ?Date of Birth: 22-Jan-1946 ?Referring Provider (OT): Dr. Rexene Alberts ? ? ?Encounter Date: 03/02/2022 ? ? OT End of Session - 03/02/22 1308   ? ? Visit Number 15   ? Number of Visits 25   ? Date for OT Re-Evaluation 03/31/22   ? Authorization Type [employer]   ? Authorization Time Period 60 days- POC written for 12 weeks anticiapte d/c following 6-8 weeks   ? Authorization - Visit Number 15   ? Authorization - Number of Visits 20   ? Progress Note Due on Visit 20   ? OT Start Time 1320   ? OT Stop Time 1400   ? OT Time Calculation (min) 40 min   ? Activity Tolerance Patient tolerated treatment well   ? Behavior During Therapy [facility] for tasks assessed/performed   ? ?  ?  ? ?  ? ? ?Past Medical History:  ?Diagnosis Date  ? Agatston coronary artery calcium score greater than 400   ? Coronary calcium score of 533. This was 69th percentile for age,  ? Aortic atherosclerosis ([place])   ? Hyperlipidemia LDL goal <70   ? Parkinson disease ([place])   ? ? ?Past Surgical History:  ?Procedure Laterality Date  ? TONSILLECTOMY    ? ? ?There were no vitals filed for this visit. ? ? Subjective Assessment - 03/02/22 1308   ? ? Subjective  Pt denies   ? Pertinent History PD diganosed in 2019   cellulitis, lymphedema, low back pain, hyperlipidemia,   ? Patient Stated Goals mobility, maintain independence   ? Currently in Pain? No/denies   ? ?  ?  ? ?  ? ? ? ?PWR! Moves (rock, twist, step) in modified quadruped x 15-20 each with min cues For incr movement amplitude, particularly with step and rock and for technique. ? ?PWR! Moves (basic 4) in supine x 15-20 each with min cues For incr movement amplitude (step out and separate hip lifts for PWR! Step) ? ?PWR! Moves (up, rock, step) in standing (at counter) x 15-20  each with min cues For incr movement amplitude, particularly with step. ? ?Educated pt in strategies for picking items off floor (and practiced/pt returned demo) as well as vacuuming-type tasks.  Educated pt in strategies for turning with use of large amplitude to decr risk for future complications. ? ?Checked remaining goals and discussed progress.  Reviewed follow-up recommendations as well as indications for earlier return to therapy.  Reviewed large amplitude movement strategies for ADLs/IADLs. ? ? ? ? ? ? ? ? OT Short Term Goals - 03/02/22 1353   ? ?  ? OT SHORT TERM GOAL #1  ? Title I with PD specific HEP   ? Time 4   ? Period Weeks   ? Status Achieved   ? Target Date 02/03/22   ?  ? OT SHORT TERM GOAL #2  ? Title Pt will verbalize understanding of adapted strategies to maximize safety and I with ADLs/ IADLs .   ? Time 4   ? Period Weeks   ? Status Achieved   needs reinforcement  ?  ? OT SHORT TERM GOAL #3  ? Title Pt will demonstrate improved fine motor coordination for ADLs as evidenced by decreasing 9 hole peg test score for RUE by 3 secs   ?  Baseline R 38.25, L 33.75   ? Time 4   ? Period Weeks   ? Status Achieved   01/31/22:  R-27.62sec, L-29.53sec  ?  ? OT SHORT TERM GOAL #4  ? Title Pt will demonstrated improved LUE functional use as evidenced by increasing box/ blocks by 3 blocks   ? Baseline R 45, L 39   ? Time 4   ? Period Weeks   ? Status Achieved   01/31/22:  R-46 blocks, L-43 blocks  ?  ? OT SHORT TERM GOAL #5  ? Title Pt will verbalize understanding of ways to keep thinking skills sharp.   ? Time 4   ? Period Weeks   ? Status Achieved   ? ?  ?  ? ?  ? ? ? ? OT Long Term Goals - 03/02/22 1355   ? ?  ? OT LONG TERM GOAL #1  ? Title Pt will verbalize understanding of ways to prevent future PD related complications and PD community resources.   ? Time 12   ? Period Weeks   ? Status Achieved   ? Target Date 03/31/22   ?  ? OT LONG TERM GOAL #2  ? Title Pt will demonstrate improved ease with feeding as  evidenced by decreasing PPT#2 (self feeding) by 3 secs   ? Baseline 14.19 secs   ? Time 12   ? Period Weeks   ? Status Achieved   03/02/22:  11.18sec  ?  ? OT LONG TERM GOAL #3  ? Title Pt will retrieve an item at 130 shoulder flexion and -20 elbow extension with RUE.   ? Baseline 125, -25   ? Time 12   ? Period Weeks   ? Status Achieved   130,-10  ?  ? OT LONG TERM GOAL #4  ? Title Pt will demonstrate ability to write a short paragraph with 100% legibility and no significant decrease in letter size.   ? Time 12   ? Period Weeks   ? Status Achieved   ? ?  ?  ? ?  ? ? ? ? ? ? ? ? Plan - 03/02/22 1309   ? ? Clinical Impression Statement Pt demonstrates improved posture and performance of functional activities.  All goals met.   ? OT Occupational Profile and History Detailed Assessment- Review of Records and additional review of physical, cognitive, psychosocial history related to current functional performance   ? Occupational performance deficits (Please refer to evaluation for details): ADL's;IADL's;Leisure;Play;Social Participation   ? Body Structure / Function / Physical Skills ADL;Balance;Endurance;Mobility;Strength;Flexibility;UE functional use;FMC;Gait;Coordination;ROM;GMC;Decreased knowledge of precautions;Decreased knowledge of use of DME;IADL;Dexterity   ? Rehab Potential Good   ? Clinical Decision Making Limited treatment options, no task modification necessary   ? Comorbidities Affecting Occupational Performance: May have comorbidities impacting occupational performance   ? Modification or Assistance to Complete Evaluation  No modification of tasks or assist necessary to complete eval   ? OT Frequency 2x / week   ? OT Duration 12 weeks   ? OT Treatment/Interventions Self-care/ADL training;Therapeutic exercise;Functional Mobility Training;Balance training;Aquatic Therapy;Ultrasound;Neuromuscular education;Splinting;Manual Therapy;Therapeutic activities;DME and/or AE instruction;Cognitive  remediation/compensation;Gait Training;Moist Heat;Fluidtherapy;Paraffin;Cryotherapy;Passive range of motion;Patient/family education   ? Plan d/c OT, schedule screens in approx 6-7 months   ? Consulted and Agree with Plan of Care Patient   ? ?  ?  ? ?  ? ? ?Patient will benefit from skilled therapeutic intervention in order to improve the following deficits and impairments:   ?Body Structure /  Function / Physical Skills: ADL, Balance, Endurance, Mobility, Strength, Flexibility, UE functional use, FMC, Gait, Coordination, ROM, GMC, Decreased knowledge of precautions, Decreased knowledge of use of DME, IADL, Dexterity ?  ?  ? ? ?Visit Diagnosis: ?Other lack of coordination ? ?Abnormal posture ? ?Other symptoms and signs involving the nervous system ? ?Unsteadiness on feet ? ?Other abnormalities of gait and mobility ? ? ? ?Problem List ?Patient Active Problem List  ? Diagnosis Date Noted  ? 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 Aortic atherosclerosis ([place]) 02/01/2022  ? Irregular heart rate 01/12/2022  ? OSA (obstructive sleep apnea) 01/12/2022  ? Parkinson's disease ([place]) 11/25/2020  ? Edema of right lower leg 11/25/2020  ? Hyperlipidemia LDL goal <70 11/25/2020  ? 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 Hearing loss 11/25/2020  ? Pityriasis rosea 11/25/2020  ? ? ? ?OCCUPATIONAL THERAPY DISCHARGE SUMMARY ? ?Visits from Start of Care: 15 ? ?Current functional level related to goals / functional outcomes: ?See above ?  ?Remaining deficits: ?Bradykinesia, rigidity, decr coordination, abnormal posture, decr balance/functional mobility ?  ?Education / Equipment: ?Pt was instructed in the following:  PD-specific HEP, adaptive strategies for ADLs/IADLs, ways to prevent future complications, community resources, keeping thinking skills sharp.  Pt verbalized understanding of all education provided.  ? ?Plan: ?Patient agrees to discharge.  Patient goals were met. Patient is being discharged due to  being pleased with the current functional level.  Pt would benefit from occupational therapy screen in approx 6-8 months to assess for need for further therapy/functional changes due to progressive nature of diagnosis.

## 2022-03-02 NOTE — Therapy (Deleted)
[facility] ?[place], [place], [address] ?Phone: [phone]   Fax:  [phone] ? ?Speech Language Pathology Treatment ? ?Patient Details  ?Name: [name] ?MRN: [id number] ?Date of Birth: Mar 13, 1946 ?Referring Provider (SLP): Dr. Rexene Alberts ? ? ?Encounter Date: 03/02/2022 ? ? End of Session - 03/02/22 1226   ? ? Visit Number 16   ? Number of Visits 25   ? Date for SLP Re-Evaluation 04/07/22   ? SLP Start Time 1400   ? SLP Stop Time  1440   ? SLP Time Calculation (min) 40 min   ? Activity Tolerance Patient tolerated treatment well   ? ?  ?  ? ?  ? ? ?Past Medical History:  ?Diagnosis Date  ? Agatston coronary artery calcium score greater than 400   ? Coronary calcium score of 533. This was 69th percentile for age,  ? Aortic atherosclerosis ([place])   ? Hyperlipidemia LDL goal <70   ? Parkinson disease ([place])   ? ? ?Past Surgical History:  ?Procedure Laterality Date  ? TONSILLECTOMY    ? ? ?There were no vitals filed for this visit. ? ? ? ? ? ? ? ? ? ADULT SLP TREATMENT - 03/02/22 1228   ? ?  ? General Information  ? Behavior/Cognition Alert;Cooperative;Pleasant mood   ?  ? Treatment Provided  ? Treatment provided Cognitive-Linquistic   ?  ? Cognitive-Linquistic Treatment  ? 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  ? Assessment / Recommendations / Plan  ? Plan Discharge SLP treatment due to (comment)   patient please with current function  ?  ? Progression Toward Goals  ? Progression toward goals Goals met, education completed, patient discharged from SLP   ? ?  ?  ? ?  ? ? ? ? ? SLP Short Term Goals - 03/02/22 1227   ? ?  ? SLP SHORT TERM GOAL #1  ? Title pt will produce loud "ah" averaging 90 dB over 5 trials with rare min A across 2 sessions   ? Baseline 01/24/22; 01/31/22   ? Status Achieved   ?  ? SLP SHORT TERM GOAL #2  ? Title pt will average 70-72 dB across 5 minute conversational sample   ? Baseline  02/02/22 increased average to high 60s with usual min A   ? Status Partially Met   ?  ? SLP SHORT TERM GOAL #3  ? Title pt with average 70-72 dB at sentence level 18/20 sentences over 2 sessions   ? Baseline 01/24/22; 01/31/22   ? Status Achieved   ?  ? SLP SHORT TERM GOAL #4  ? Title pt will have 10 or less throat clears over 45 minute session   ? Baseline 01/24/22; 02/02/22   ? Status Achieved   ? ?  ?  ? ?  ? ? ? SLP Long Term Goals - 03/02/22 1227   ? ?  ? SLP LONG TERM GOAL #1  ? Title pt will average 90 dB with x5 loud "ah" mod I over 3 sessions   ? Baseline 02-28-22, 03-02-2022   ? Period Weeks   ? Status Achieved   ?  ? SLP LONG TERM GOAL #2  ? Title pt will average 70-72 dB over 20 minute conversation with rare min A   ? Baseline 03-02-22   ? Status Partially Met   ?  ? SLP LONG TERM GOAL #3  ? Title pt/spouse report 50% decrease  in requests for repetition subjectively at home   ? Baseline 03-02-22   ? Status Achieved   ?  ? SLP LONG TERM GOAL #4  ? Title pt/spouse will report compliance with vocal hygiene recommendations over 1 week period   ? Baseline 02-28-22, 03-02-22   ? Status Partially Met   ? ?  ?  ? ?  ? ? ? ? ?Patient will benefit from skilled therapeutic intervention in order to improve the following deficits and impairments:   ?Dysarthria ? ? ? ?Problem List ?Patient Active Problem List  ? Diagnosis Date Noted  ? 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 Aortic atherosclerosis ([place]) 02/01/2022  ? Irregular heart rate 01/12/2022  ? OSA (obstructive sleep apnea) 01/12/2022  ? Parkinson's disease ([place]) 11/25/2020  ? Edema of right lower leg 11/25/2020  ? Hyperlipidemia LDL goal <70 11/25/2020  ? 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 Hearing loss 11/25/2020  ? Pityriasis rosea 11/25/2020  ? ? ?Su Monks, CCC-SLP ?03/02/2022, 1:48 PM ? ?[facility] ?[place], [place], [address] ?Phone: [phone]   Fax:   [phone] ? ? ?Name: [name] ?MRN: [id number] ?Date of Birth: July 23, 1946 ? ?

## 2022-03-07 ENCOUNTER — Ambulatory Visit: Payer: Medicare HMO | Admitting: Physical Therapy

## 2022-03-07 DIAGNOSIS — R2681 Unsteadiness on feet: Secondary | ICD-10-CM | POA: Diagnosis not present

## 2022-03-07 DIAGNOSIS — R2689 Other abnormalities of gait and mobility: Secondary | ICD-10-CM | POA: Diagnosis not present

## 2022-03-07 DIAGNOSIS — M6281 Muscle weakness (generalized): Secondary | ICD-10-CM

## 2022-03-07 DIAGNOSIS — R471 Dysarthria and anarthria: Secondary | ICD-10-CM | POA: Diagnosis not present

## 2022-03-07 DIAGNOSIS — R29818 Other symptoms and signs involving the nervous system: Secondary | ICD-10-CM | POA: Diagnosis not present

## 2022-03-07 DIAGNOSIS — R293 Abnormal posture: Secondary | ICD-10-CM | POA: Diagnosis not present

## 2022-03-07 DIAGNOSIS — R278 Other lack of coordination: Secondary | ICD-10-CM | POA: Diagnosis not present

## 2022-03-07 NOTE — Therapy (Signed)
?OUTPATIENT PHYSICAL THERAPY TREATMENT NOTE ? ? ?Patient Name: [name] ?MRN: [id number] ?DOB:Jan 01, 1946, 76 y.o., male ?Today's Date: 03/07/2022 ? ?PCP: Lesleigh Noe, MD ?REFERRING PROVIDER: Star Age, MD  ? ? PT End of Session - 03/07/22 1234   ? ? Visit Number 18   ? Number of Visits 25   Plus eval  ? Date for PT Re-Evaluation 03/30/22   ? Authorization Type [employer] Medicare   ? Authorization Time Period 01/05/22 - 03/30/22; [employer] Approved 16 PT visits from 02/20/2022-05/15/2022   ? Progress Note Due on Visit 20   ? PT Start Time 1231   ? PT Stop Time 4287   ? PT Time Calculation (min) 50 min   ? Equipment Utilized During Treatment Gait belt   ? Activity Tolerance Patient tolerated treatment well   ? Behavior During Therapy [facility] for tasks assessed/performed   ? ?  ?  ? ?  ? ? ? ? ? ? ? ?Past Medical History:  ?Diagnosis Date  ? Agatston coronary artery calcium score greater than 400   ? Coronary calcium score of 533. This was 69th percentile for age,  ? Aortic atherosclerosis ([place])   ? Hyperlipidemia LDL goal <70   ? Parkinson disease ([place])   ? ?Past Surgical History:  ?Procedure Laterality Date  ? TONSILLECTOMY    ? ?Patient Active Problem List  ? Diagnosis Date Noted  ? 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 Aortic atherosclerosis ([place]) 02/01/2022  ? Irregular heart rate 01/12/2022  ? OSA (obstructive sleep apnea) 01/12/2022  ? Parkinson's disease ([place]) 11/25/2020  ? Edema of right lower leg 11/25/2020  ? Hyperlipidemia LDL goal <70 11/25/2020  ? 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 Hearing loss 11/25/2020  ? Pityriasis rosea 11/25/2020  ? ? ?REFERRING DIAG: G20 (ICD-10-CM) - Parkinson's disease ([place])  ? ?THERAPY DIAG:  ?Unsteadiness on feet ? ?Other abnormalities of gait and mobility ? ?Muscle weakness (generalized) ? ?PERTINENT HISTORY: HLD, cellulitis, lymphedema, low back pain and hearing loss. ? ?PRECAUTIONS: Fall ? ?SUBJECTIVE: No changes since he was last here   ? ?PAIN:  ?Are you having pain? No ? ? ?TODAY'S TREATMENT:  ? ?Gait Training  ?The following treadmill training was completed for aerobic/neural priming, endurance, and gait speed.  ?- Warmup: 2:00 up to 2.2 mph ?- HIIT: 5:00 30sec ON/OFF alternating 2.2 / 2.9 mph   ?- Cool down: 90 sec at 1.8 mph ? ?Self-care/home management ?Discussed bathroom modifications to enhance safety w/shower transfer at home. Pt unable to install grab bar in walk-in shower due to configuration of shower, wife present to show photos of shower and part of session spent brainstorming ideas for pt to be able to have UE support on solid surface while exiting shower. Encouraged pt and wife to consider using ADA guidelines to reconfigure shower, as this is a project they have been discussing for while.  ? ?Reviewed updates to HEP and POC moving forward.  ? ? ?NMR ?Standing in front of wall, wall-facing squats w/bilat OH reach to facilitate upright posture and anterior chest stretch. Placed chair behind pt for safety. Pt able to perform 5 reps prior to needing rest break. Added to HEP. ? ? ?PATIENT EDUCATION: ?Education details: Updates to HEP, bathroom modifications  ?Person educated: Patient ?Education method: Explanation  ?Education comprehension: verbalized understanding ? ? ?HOME EXERCISE PROGRAM: ?Access Code: [email or web address] ?URL: [email or web address] ?Date: 03/02/2022 ?Prepared by: Janann August ? ?Exercises ?- Wall push up with  strong push away  - 1 x daily - 7 x weekly - 3 sets - 10 reps ?- Quadruped Thoracic Rotation - Reach Under  - 1 x daily - 7 x weekly - 3 sets - 10 reps ?- Half-Kneeling Forward Lean  - 1 x daily - 7 x weekly - 3 sets - 10 reps ?- Quadruped rock back  - 1 x daily - 7 x weekly - 3 sets - 10 reps ?- Dead Bug  - 1 x daily - 7 x weekly - 3 sets - 10 reps ?- Quadruped Ambulance person  - 1 x daily - 7 x weekly - 3 sets - 10 reps ?- Modified Superman on Table  - 1 x daily - 7 x weekly - 3 sets - 10 reps - 2second  hold ?- Correct Standing Posture  - 1 x daily - 5 x weekly - 30 hold ?- Standing Marching  - 1 x daily - 5 x weekly - 1-2 sets - 10 reps ?- Standing Balance with Eyes Closed on Foam  - 1 x daily - 5 x weekly - 3 sets - 30 hold ?- Wall-facing squats - 1 x daily - 5 x weekly - 1-2 sets - 10 reps ? ? ? PT Short Term Goals - 01/06/22 0806   ? ?  ? PT SHORT TERM GOAL #1  ? Title Pt will be independent with initial HEP with wife's supervision in order to build upon functional gains made in therapy. ALL STGS DUE 02/03/22   ? Time 4   ? Period Weeks   ? Status Achieved   ? Target Date 02/03/22   ?  ? PT SHORT TERM GOAL #2  ? Title Pt will maintain gait speed of 2.8 ft/sec with LRAD with supervision in order to demo improved community ambulation.   ? Baseline 2.77 ft/sec with no AD - min guard; 3.3 ft/s without RW and 3.23 ft/s with AD  ? Time 4   ? Period Weeks   ? Status Achieved   ?  ? PT SHORT TERM GOAL #3  ? Title Pt will improve 5x sit <> stand with no UE support to 19 seconds or less in order to demo improved functional strength/decr fall risk.   ? Baseline 22.97 seconds, 12.5s on 3/21    ? Time 4   ? Period Weeks   ? Status Achieved   ?  ? PT SHORT TERM GOAL #4  ? Title Pt will ambulate at least 230' over level indoor surfaces with LRAD with supervision for improved safety with mobility.   ? Baseline no AD- needs min guard and min A for balance at times; no AD, CGA   ? Time 4   ? Period Weeks   ? Status Acheived   ?  ? PT SHORT TERM GOAL #5  ? Title Pt and pt's wife will verbalize understanding of fall prevention in the home.   ? Time 4   ? Period Weeks   ? Status Achieved   ? ?  ?  ? ?  ? ? ? PT Long Term Goals - 01/06/22 0818   ? ?  ? PT LONG TERM GOAL #1  ? Title Pt will be independent with final HEP with wife's supervision in order to build upon functional gains made in therapy. ALL LTGS DUE 03/03/22   ? Time 8   ? Period Weeks   ? Status ON GOING  ? Target Date 03/03/22   ?  ?  PT LONG TERM GOAL #2  ? Title Pt will  improve miniBEST to at least a 18/28 in order to demo decr fall risk.   ? Baseline 12/28; 21/28   ? Time 8   ? Period Weeks   ? Status MET   ?  ? PT LONG TERM GOAL #3  ? Title Pt will improve 5x sit <> stand with no UE support to 15 seconds or less in order to demo improved functional strength/decr fall risk.   ? Baseline 22.97 seconds; 11.63 seconds on 03/02/22  ? Time 8   ? Period Weeks   ? Status MET  ?  ? PT LONG TERM GOAL #4  ? Title Pt will ambulate at least 400' over outdoor paved surfaces with LRAD with mod I in order to demo improved community mobility.   ? Time 8   ? Period Weeks   ? Status  PARTIALLY MET  ?  ? PT LONG TERM GOAL #5  ? Title Pt will verbalize understanding of local Parkinson's disease resources,   ? Time 8   ? Period Weeks   ? Status MET  ?  ? Additional Long Term Goals  ? Additional Long Term Goals Yes   ?  ? PT LONG TERM GOAL #6  ? Title Pt will recover posterior and anterior balance in push and release test in 2 steps independently, for improved balance recovery   ? Time 8   ? Period Weeks   ? Status MET   ? ?  ?  ? ?  ? ?Updated LTGs going forwards for 12 week POC: ? PT Long Term Goals - 03/02/22 1431   ? ?  ? PT LONG TERM GOAL #1  ? Title Pt will be independent with final HEP with wife's supervision in order to build upon functional gains made in therapy. ALL LTGS DUE 03/30/22   ? Time 12   ? Period Weeks   ? Status On-going   ? Target Date 03/30/22   ?  ? PT LONG TERM GOAL #2  ? Title Pt will improve SLS time to at least 4-5 seconds bilat for improved balance.   ? Baseline 1 second   ? Time 12   ? Period Weeks   ? Status New   ?  ? PT LONG TERM GOAL #3  ? Title Pt will hold condition 4 of mCTSIB for at least 20 seconds for improved vestibular input for balance.   ? Baseline 13.4 seconds   ? Time 12   ? Period Weeks   ? Status New   ?  ? PT LONG TERM GOAL #4  ? Title Pt will ambulate at least 600' over outdoor paved surfaces with LRAD with mod I in order to demo improved community  mobility.   ? Time 12   ? Period Weeks   ? Status Revised   ?  ? PT LONG TERM GOAL #5  ? Title Pt will perform floor transfers with SBA for improved balance recovery.   ? Time 12   ? Period Weeks   ? Status N

## 2022-03-08 ENCOUNTER — Ambulatory Visit: Payer: Medicare HMO | Admitting: Physical Therapy

## 2022-03-08 DIAGNOSIS — M6281 Muscle weakness (generalized): Secondary | ICD-10-CM

## 2022-03-08 DIAGNOSIS — R2681 Unsteadiness on feet: Secondary | ICD-10-CM

## 2022-03-08 DIAGNOSIS — R278 Other lack of coordination: Secondary | ICD-10-CM | POA: Diagnosis not present

## 2022-03-08 DIAGNOSIS — R293 Abnormal posture: Secondary | ICD-10-CM | POA: Diagnosis not present

## 2022-03-08 DIAGNOSIS — R2689 Other abnormalities of gait and mobility: Secondary | ICD-10-CM | POA: Diagnosis not present

## 2022-03-08 DIAGNOSIS — R471 Dysarthria and anarthria: Secondary | ICD-10-CM | POA: Diagnosis not present

## 2022-03-08 DIAGNOSIS — R29818 Other symptoms and signs involving the nervous system: Secondary | ICD-10-CM | POA: Diagnosis not present

## 2022-03-08 NOTE — Therapy (Signed)
?OUTPATIENT PHYSICAL THERAPY TREATMENT NOTE ? ? ?Patient Name: [name] ?MRN: [id number] ?DOB:January 13, 1946, 76 y.o., male ?Today's Date: 03/08/2022 ? ?PCP: Lesleigh Noe, MD ?REFERRING PROVIDER: Star Age, MD  ? ? PT End of Session - 03/08/22 1448   ? ? Visit Number 19   ? Number of Visits 25   Plus eval  ? Date for PT Re-Evaluation 03/30/22   ? Authorization Type [employer] Medicare   ? Authorization Time Period 01/05/22 - 03/30/22; [employer] Approved 16 PT visits from 02/20/2022-05/15/2022   ? Progress Note Due on Visit 20   ? PT Start Time 1448   ? PT Stop Time 3794   ? PT Time Calculation (min) 44 min   ? Equipment Utilized During Treatment Gait belt   ? Activity Tolerance Patient tolerated treatment well   ? Behavior During Therapy [facility] for tasks assessed/performed   ? ?  ?  ? ?  ? ? ? ? ? ? ? ?Past Medical History:  ?Diagnosis Date  ? Agatston coronary artery calcium score greater than 400   ? Coronary calcium score of 533. This was 69th percentile for age,  ? Aortic atherosclerosis ([place])   ? Hyperlipidemia LDL goal <70   ? Parkinson disease ([place])   ? ?Past Surgical History:  ?Procedure Laterality Date  ? TONSILLECTOMY    ? ?Patient Active Problem List  ? Diagnosis Date Noted  ? 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 Aortic atherosclerosis ([place]) 02/01/2022  ? Irregular heart rate 01/12/2022  ? OSA (obstructive sleep apnea) 01/12/2022  ? Parkinson's disease ([place]) 11/25/2020  ? Edema of right lower leg 11/25/2020  ? Hyperlipidemia LDL goal <70 11/25/2020  ? 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 Hearing loss 11/25/2020  ? Pityriasis rosea 11/25/2020  ? ? ?REFERRING DIAG: G20 (ICD-10-CM) - Parkinson's disease ([place])  ? ?THERAPY DIAG:  ?Unsteadiness on feet ? ?Other abnormalities of gait and mobility ? ?Muscle weakness (generalized) ? ?PERTINENT HISTORY: HLD, cellulitis, lymphedema, low back pain and hearing loss. ? ?PRECAUTIONS: Fall ? ?SUBJECTIVE: No changes since he was last here.  Figured out he can get an attachment to make his CPAP more comfortable to wear.  ? ?PAIN:  ?Are you having pain? No ? ? ?TODAY'S TREATMENT:  ? ?Gait Training  ?The following treadmill training was completed for aerobic/neural priming, endurance, step clearance and gait speed.  ?- Warmup: 2:00 up to 2.2 mph ?- HIIT: 5:00 30sec ON/OFF alternating 0% and 4% incline ?- Cool down: 90 sec at 1.8 mph ? ? ?NMR  ?Practiced transfer from mat to floor w/S* and the following were performed on floor mat for improved posterior chain strength, postural control and BLE eccentric control:  ?-Modified squat w/BUE abduction at top of movement, x15. Noted pt unable to push buttocks towards heels and had difficulty maintaining balance without UE support  ?-Reverse hamstring curls, x10 ?Tall kneel > stand w/S*, notable difficulty bringing LLE forward to stand but pt able to perform slowly.  ? ?Using low mat table, plank walk outs in modified plantigrade position, x8 per side for improved core stability and BLE dissociation. Pt required min multimodal cues to maintain plank position and had notable difficulty moving RLE compared to LLE.  ? ?Self-care/home management ?Discussed therapist recommendations to current shower arrangement. Therapist recommends pt inquire about placing shower door hinges on opposite side so door swings open away form wall and installing grab bars on wall. Therapist does not recommend installing grab  rail on floor adjacent to shower due to potentially needing to remove shower doors in future and thus needing to remove grab rail. Recommending at this time, pt park rollator beside shower and use handle to assist getting into/out of shower. Pt verbalized understanding and will contact plumber to see if shower door hinges can be changed.  ? ? ?PATIENT EDUCATION: ?Education details: See self-care section  ?Person educated: Patient ?Education method: Explanation  ?Education comprehension: verbalized  understanding ? ? ?HOME EXERCISE PROGRAM: ?Access Code: [email or web address] ?URL: [email or web address] ?Date: 03/02/2022 ?Prepared by: Janann August ? ?Exercises ?- Wall push up with strong push away  - 1 x daily - 7 x weekly - 3 sets - 10 reps ?- Quadruped Thoracic Rotation - Reach Under  - 1 x daily - 7 x weekly - 3 sets - 10 reps ?- Half-Kneeling Forward Lean  - 1 x daily - 7 x weekly - 3 sets - 10 reps ?- Quadruped rock back  - 1 x daily - 7 x weekly - 3 sets - 10 reps ?- Dead Bug  - 1 x daily - 7 x weekly - 3 sets - 10 reps ?- Quadruped Ambulance person  - 1 x daily - 7 x weekly - 3 sets - 10 reps ?- Modified Superman on Table  - 1 x daily - 7 x weekly - 3 sets - 10 reps - 2second hold ?- Correct Standing Posture  - 1 x daily - 5 x weekly - 30 hold ?- Standing Marching  - 1 x daily - 5 x weekly - 1-2 sets - 10 reps ?- Standing Balance with Eyes Closed on Foam  - 1 x daily - 5 x weekly - 3 sets - 30 hold ?- Wall-facing squats - 1 x daily - 5 x weekly - 1-2 sets - 10 reps ? ? ? PT Short Term Goals - 01/06/22 0806   ? ?  ? PT SHORT TERM GOAL #1  ? Title Pt will be independent with initial HEP with wife's supervision in order to build upon functional gains made in therapy. ALL STGS DUE 02/03/22   ? Time 4   ? Period Weeks   ? Status Achieved   ? Target Date 02/03/22   ?  ? PT SHORT TERM GOAL #2  ? Title Pt will maintain gait speed of 2.8 ft/sec with LRAD with supervision in order to demo improved community ambulation.   ? Baseline 2.77 ft/sec with no AD - min guard; 3.3 ft/s without RW and 3.23 ft/s with AD  ? Time 4   ? Period Weeks   ? Status Achieved   ?  ? PT SHORT TERM GOAL #3  ? Title Pt will improve 5x sit <> stand with no UE support to 19 seconds or less in order to demo improved functional strength/decr fall risk.   ? Baseline 22.97 seconds, 12.5s on 3/21    ? Time 4   ? Period Weeks   ? Status Achieved   ?  ? PT SHORT TERM GOAL #4  ? Title Pt will ambulate at least 230' over level indoor surfaces with  LRAD with supervision for improved safety with mobility.   ? Baseline no AD- needs min guard and min A for balance at times; no AD, CGA   ? Time 4   ? Period Weeks   ? Status Acheived   ?  ? PT SHORT TERM GOAL #5  ? Title Pt and pt's wife will verbalize  understanding of fall prevention in the home.   ? Time 4   ? Period Weeks   ? Status Achieved   ? ?  ?  ? ?  ? ? ? PT Long Term Goals - 01/06/22 0818   ? ?  ? PT LONG TERM GOAL #1  ? Title Pt will be independent with final HEP with wife's supervision in order to build upon functional gains made in therapy. ALL LTGS DUE 03/03/22   ? Time 8   ? Period Weeks   ? Status ON GOING  ? Target Date 03/03/22   ?  ? PT LONG TERM GOAL #2  ? Title Pt will improve miniBEST to at least a 18/28 in order to demo decr fall risk.   ? Baseline 12/28; 21/28   ? Time 8   ? Period Weeks   ? Status MET   ?  ? PT LONG TERM GOAL #3  ? Title Pt will improve 5x sit <> stand with no UE support to 15 seconds or less in order to demo improved functional strength/decr fall risk.   ? Baseline 22.97 seconds; 11.63 seconds on 03/02/22  ? Time 8   ? Period Weeks   ? Status MET  ?  ? PT LONG TERM GOAL #4  ? Title Pt will ambulate at least 400' over outdoor paved surfaces with LRAD with mod I in order to demo improved community mobility.   ? Time 8   ? Period Weeks   ? Status  PARTIALLY MET  ?  ? PT LONG TERM GOAL #5  ? Title Pt will verbalize understanding of local Parkinson's disease resources,   ? Time 8   ? Period Weeks   ? Status MET  ?  ? Additional Long Term Goals  ? Additional Long Term Goals Yes   ?  ? PT LONG TERM GOAL #6  ? Title Pt will recover posterior and anterior balance in push and release test in 2 steps independently, for improved balance recovery   ? Time 8   ? Period Weeks   ? Status MET   ? ?  ?  ? ?  ? ?Updated LTGs going forwards for 12 week POC: ? PT Long Term Goals - 03/02/22 1431   ? ?  ? PT LONG TERM GOAL #1  ? Title Pt will be independent with final HEP with wife's supervision  in order to build upon functional gains made in therapy. ALL LTGS DUE 03/30/22   ? Time 12   ? Period Weeks   ? Status On-going   ? Target Date 03/30/22   ?  ? PT LONG TERM GOAL #2  ? Title Pt will improve SLS time to at leas

## 2022-03-13 ENCOUNTER — Ambulatory Visit: Payer: Medicare HMO | Admitting: Neurology

## 2022-03-13 ENCOUNTER — Encounter: Payer: Self-pay | Admitting: Neurology

## 2022-03-13 VITALS — BP 107/64 | HR 67 | Ht 75.0 in | Wt 250.4 lb

## 2022-03-13 DIAGNOSIS — Z9989 Dependence on other enabling machines and devices: Secondary | ICD-10-CM

## 2022-03-13 DIAGNOSIS — G20 Parkinson's disease: Secondary | ICD-10-CM

## 2022-03-13 DIAGNOSIS — G4733 Obstructive sleep apnea (adult) (pediatric): Secondary | ICD-10-CM

## 2022-03-13 MED ORDER — CARBIDOPA-LEVODOPA 25-100 MG PO TABS: 1.0000 | ORAL_TABLET | Freq: Three times a day (TID) | ORAL | 3 refills | Status: DC

## 2022-03-13 NOTE — Patient Instructions (Signed)
Please continue using your autoPAP regularly. While your insurance requires that you use PAP at least 4 hours each night on 70% of the nights, I recommend, that you not skip any nights and use it throughout the night if you can. Getting used to PAP and staying with the treatment long term does take time and patience and discipline. Untreated obstructive sleep apnea when it is moderate to severe can have an adverse impact on cardiovascular health and raise her risk for heart disease, arrhythmias, hypertension, congestive heart failure, stroke and diabetes. Untreated obstructive sleep apnea causes sleep disruption, nonrestorative sleep, and sleep deprivation. This can have an impact on your day to day functioning and cause daytime sleepiness and impairment of cognitive function, memory loss, mood disturbance, and problems focussing. Using PAP regularly can improve these symptoms. ? ?Constipation can be a big problem in Parkinson's disease. It is important to be proactive: this includes ensuring adequate water intake and mobilization (walking around), utilizing stool softeners as needed, an over-the-counter laxative as needed up to daily if needed, adding a probiotic in pill form or in the form of yogurt can help as well. Sometimes, using a suppository or enema becomes necessary.  ? ?Please continue your parkinson's meds at the same doses and times.  ?Follow up to see the NP in about 3 months.  ? ?We will increase your pressure settings.  ? ?

## 2022-03-13 NOTE — Progress Notes (Signed)
Subjective:  ?  ?Patient ID: [name] is a 76 y.o. male. ? ?HPI ? ? ? ?Interim history:  ? ?[name] is a 76 year old right-handed gentleman with an underlying medical history of parkinsonism, cellulitis, lymphedema, low back pain, hyperlipidemia, and mild obesity, who presents for follow up consultation of his sleep apnea, after starting autoPAP. I last saw him on 12/29/2021, at which he felt stable from the PD standpoint.  ?His AutoPap set up date was 02/07/22.  ? ?Today, 03/13/2022: I reviewed his AutoPap compliance data from 02/07/2022 through 03/08/2022, which is a total of 30 days, during which time he used his machine every night with percent use days greater than 4 hours at 97%, indicating excellent compliance with an average usage of 5 hours and 28 minutes, residual AHI elevated at 21.3/h, mostly obstructive in nature, central apnea index of 2.9/h, pressure of 5 to 11 cm with EPR of 2.  Average pressure for the 95th percentile at 10.8 cm, maximum 11 cm, leak on the higher side with the 95th percentile at 25.9 L/min.  He is still adjusting to treatment.  He reports feeling a little better, wife still is worried about his daytime somnolence.  He is using an AirTouch fullface mask but it does pinch his nasal bridge.  He is looking into getting a gecko nasal gel for from [facility].  Would be willing to increase his pressure setting.  The pressure does not bother him.  He does have dry mouth.  He uses the humidifier in the machine consistently. ?He reports feeling fairly stable. He finished outpt OT and ST through neuro rehab and has ongoing PT. He tries to hydrate well with water. ?He exercises on a regular basis and uses a stationary bike.  He has constipation and uses prunes and a laxative prn. No recent falls.  ?  ?The patient's allergies, current medications, family history, past medical history, past social history, past surgical history and problem list were reviewed and updated as appropriate.  ?   ?Previously:  ? ? ?I first met him at the request of his primary care physician on 02/07/2021, at which time he reported parkinsonian symptoms dating back to late 2018.  He was on Sinemet 1 pill 3 times daily and pramipexole 1 mg 3 times daily.  He was advised to continue with his medication regimen.  He was sleepy during the day when pursuing sleep testing.  He had a baseline sleep study on 02/21/2021 which indicated overall mild obstructive sleep apnea with an AHI of 5.6/h, REM AHI was 30.4/h, supine AHI 10.5/h, average oxygen saturation 91% with a nadir of 83%.  He did have significant PLM's without significant arousals however.  He had frequent PACs.  He was advised to start AutoPap therapy to see if his sleepiness would improve. ?  ?  ?02/07/21: (He) reports symptoms of Parkinson's disease dating back to late 2018 or early 2019.  He was diagnosed with Parkinson's disease in or around May 2019.  Initially, symptoms consisted of changes in his voice, changes noted in his facial expression, posture and gait changes.  He noticed a hand tremor, it was bilateral as far as he and his wife recall.  Symptoms were not necessarily worse on one side.  He is right-handed and noticed his tremor more on the right.  His mother had a diagnosis of essential tremor.  She lived to be 40, father died at 21.  Father had prostate cancer.  Patient has had changes in  his posture over time and gait difficulty, has had some falls, last fall about a month or 2 ago.  He had significant daytime somnolence as part of his initial presentation but no evidence of dream enactment disorder.  ?He has never had a sleep study.  He does snore but snoring is better when he sleeps elevated with head of bed.  They have purchased an adjustable bed. ?He is retired, he is a Therapist, nutritional, worked as a Water quality scientist in college.  They moved to [facility].  They moved to [place] in November 2021.  They have a daughter in [place].  Sister also  lives close by.  He smokes cigars occasionally.  He drinks alcohol in the form of beer, 1 or 2/day on average. ?  ? He previously followed with a neurologist in [place].  I reviewed your office note from 11/25/2020.  He has been on Sinemet 25-100 mg strength 1 pill 3 times daily and Mirapex 1 mg strength 1 pill 3 times daily. I have reviewed records from his physician, Dr. Aniceto Boss.  His last visit was 07/21/2020, prior to that he was seen on 03/18/2020, at which time his Mirapex was increased from 0.5 mg 3 times daily to 1 mg 3 times daily.  He had a visit on 11/18/2019.  At his visit on 07/14/2019 his Mirapex was 0.25 mg 3 times daily and he was on Sinemet 25-100 mg strength 1 pill 3 times daily.  He was started on Azilect on 11/26/2018.  This was discontinued in May 2020 due to cost.  In October 2019 he was started on Requip and Rytary.  Rytary was discontinued due to cost.  He had side effects of drowsiness when he was on Requip.  He had a DaTscan which was positive for asymmetrically diminished uptake. ?He had an EEG on 08/24/2020 and I reviewed the report: Impression: Normal EEG during wakefulness.  He had a nuclear medicine DaTscan in [place], [place] on 07/23/2018 and I reviewed the results: Impression: #1 abnormal study. ?2.  Asymmetrically diminished putamen activity compatible with Parkinson's disease or related syndrome. ?  ?He denies any significant orthostatic lightheadedness.  His wife has noticed a change in his posture.  He had physical therapy when in [place] which was helpful.  He tries to stay active, he is using a stationary bike.  He has occasional constipation. ?  ?He has had significant weight loss in the recent past.  He has not had any specific work-up for weight loss.  His wife does report that he eats slowly which is not a new trait. ? ? ?His Past Medical History Is Significant For: ?Past Medical History:  ?Diagnosis Date  ? Agatston coronary artery calcium score greater than 400    ? Coronary calcium score of 533. This was 69th percentile for age,  ? Aortic atherosclerosis ([place])   ? Hyperlipidemia LDL goal <70   ? Parkinson disease [facility])   ? ? ?His Past Surgical History Is Significant For: ?Past Surgical History:  ?Procedure Laterality Date  ? TONSILLECTOMY    ? ? ?His Family History Is Significant For: ?Family History  ?Problem Relation Age of Onset  ? Arthritis Mother   ? Hearing loss Mother   ? Hypertension Mother   ? Prostate cancer Father   ? Hearing loss Father   ? Heart attack Father 46  ? Hyperlipidemia Father   ? Hypertension Sister   ? Heart disease Maternal Grandmother   ? Hypertension Maternal Grandmother   ?  Arthritis Maternal Grandmother   ? Diabetes Daughter   ? Sleep apnea Neg Hx   ? ? ?His Social History Is Significant For: ?Social History  ? ?Socioeconomic History  ? Marital status: Married  ?  Spouse name: [name]  ? Number of children: 1  ? Years of education: master's degree  ? Highest education level: Not on file  ?Occupational History  ? Not on file  ?Tobacco Use  ? Smoking status: Some Days  ?  Types: Pipe  ? Smokeless tobacco: Never  ? Tobacco comments:  ?  EVERY SIX MONTHS   ?Vaping Use  ? Vaping Use: Never used  ?Substance and Sexual Activity  ? Alcohol use: Not Currently  ?  Alcohol/week: 7.0 standard drinks  ?  Types: 7 Cans of beer per week  ? Drug use: Never  ? Sexual activity: Yes  ?  Birth control/protection: Post-menopausal  ?Other Topics Concern  ? Not on file  ?Social History Narrative  ? 11/25/20  ? From: [place], but from this area, moved back 2022  ? Living: with wife, [name] [phone])  ? Work: retired Librarian, academic, played piano  ?   ? Family: Cristen in [place], 2 granddaughters   ?   ? Enjoys: piano, read, crosswords  ?   ? Exercise: planning to go to the [facility]  ? Diet: good, yogurt, cereal, big meal at lunchtime, light dinner  ?   ? Safety  ? Seat belts: Yes   ? Guns: no  ? Safe in relationships: Yes   ? ?Social  Determinants of Health  ? ?Financial Resource Strain: Not on file  ?Food Insecurity: Not on file  ?Transportation Needs: Not on file  ?Physical Activity: Not on file  ?Stress: Not on file  ?Social Connections:

## 2022-03-14 ENCOUNTER — Ambulatory Visit: Payer: Medicare HMO | Attending: Family Medicine | Admitting: Physical Therapy

## 2022-03-14 DIAGNOSIS — R2689 Other abnormalities of gait and mobility: Secondary | ICD-10-CM | POA: Insufficient documentation

## 2022-03-14 DIAGNOSIS — R293 Abnormal posture: Secondary | ICD-10-CM | POA: Insufficient documentation

## 2022-03-14 DIAGNOSIS — R2681 Unsteadiness on feet: Secondary | ICD-10-CM | POA: Diagnosis not present

## 2022-03-14 DIAGNOSIS — M6281 Muscle weakness (generalized): Secondary | ICD-10-CM | POA: Diagnosis not present

## 2022-03-14 NOTE — Therapy (Signed)
?OUTPATIENT PHYSICAL THERAPY TREATMENT NOTE - 20TH VISIT PROGRESS NOTE ? ? ?Patient Name: [name] ?MRN: [id number] ?DOB:August 02, 1946, 76 y.o., male ?Today's Date: 03/14/2022 ? ?PCP: Lesleigh Noe, MD ?REFERRING PROVIDER: Star Age, MD  ? ?Physical Therapy Progress Note ?  ?Dates of Reporting Period:01/05/22 - 03/14/22 ? ?See Note below for Objective Data and Assessment of Progress/Goals. ? ?Thank you for the referral of this patient. ?Francena Hanly, PT, DPT  ? ? PT End of Session - 03/14/22 1106   ? ? Visit Number 20   ? Number of Visits 25   Plus eval  ? Date for PT Re-Evaluation 03/30/22   ? Authorization Type [employer]   ? Authorization Time Period 01/05/22 - 03/30/22; [facility] Approved 16 PT visits from 02/20/2022-05/15/2022   ? Progress Note Due on Visit 20   ? PT Start Time 1104   ? PT Stop Time 1145   ? PT Time Calculation (min) 41 min   ? Equipment Utilized During Treatment --   ? Activity Tolerance Patient tolerated treatment well   ? Behavior During Therapy [facility] for tasks assessed/performed   ? ?  ?  ? ?  ? ? ? ? ? ? ? ? ?Past Medical History:  ?Diagnosis Date  ? Agatston coronary artery calcium score greater than 400   ? Coronary calcium score of 533. This was 69th percentile for age,  ? Aortic atherosclerosis ([place])   ? Hyperlipidemia LDL goal <70   ? Parkinson disease ([place])   ? ?Past Surgical History:  ?Procedure Laterality Date  ? TONSILLECTOMY    ? ?Patient Active Problem List  ? Diagnosis Date Noted  ? 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 Aortic atherosclerosis ([place]) 02/01/2022  ? Irregular heart rate 01/12/2022  ? OSA (obstructive sleep apnea) 01/12/2022  ? Parkinson's disease ([place]) 11/25/2020  ? Edema of right lower leg 11/25/2020  ? Hyperlipidemia LDL goal <70 11/25/2020  ? 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 Hearing loss 11/25/2020  ? Pityriasis rosea 11/25/2020  ? ? ?REFERRING DIAG: G20 (ICD-10-CM) - Parkinson's disease ([place])  ? ?THERAPY DIAG:   ?Unsteadiness on feet ? ?Other abnormalities of gait and mobility ? ?Muscle weakness (generalized) ? ?PERTINENT HISTORY: HLD, cellulitis, lymphedema, low back pain and hearing loss. ? ?PRECAUTIONS: Fall ? ?SUBJECTIVE: No changes since he was last here. Had CPAP appointment yesterday for mask adjustment.  ? ?PAIN:  ?Are you having pain? No - R ankle is making it's presence known but no pain to report ? ? ?TODAY'S TREATMENT:  ? ?Gait Training  ?The following treadmill training was completed for aerobic/neural priming, endurance, step clearance/amplitude and gait speed.  ?- Warmup: 2:00 up to 2.1 mph ?- HIIT: 5:00 30sec ON/OFF alternating 2.1 mph and 2.9 mph. Min cues to maintain upright posture as pt tends to increase forward lean w/fatigue and take smaller, quick steps rather than large, high amplitude steps w/increased speed  ?- Cool down: 90 sec at 1.8 mph ? ?NMR  ?Standing alt. march w/5# KB OH hold for improved single leg stance, postural control and core stability, x6 per side w/CGA for safety. Pt unable to maintain full extension of KB overhead ad assumed forward flexed posture, making it difficult to stand on one leg >1s. Regressed to performing in front of wall and cued pt to maintain contact with wall throughout movement to facilitate trunk extension and upright posture. Pt performed x5 per side against wall and demonstrated improved step clearance  and slight increase in single leg stance time. Added to HEP (see bolded below) for continued single leg stance practice and postural control.  ? ?Reviewed squat while facing wall and added to HEP (see bolded below and pt instructions) for continued posterior chain strength for postural control. Pt performed x8 reps w/S* of therapist, min cues to avoid contact with wall. ? ? ?PATIENT EDUCATION: ?Education details: Updates to HEP and progress towards LTGs  ?Person educated: Patient ?Education method: Explanation and handout  ?Education comprehension: verbalized  understanding ? ? ?HOME EXERCISE PROGRAM: ?Access Code: [email or web address] ?URL: [email or web address] ?Date: 03/14/2022 ?Prepared by: Mickie Bail Gertie Broerman ? ?Exercises ?- Wall push up with strong push away  - 1 x daily - 7 x weekly - 3 sets - 10 reps ?- Quadruped Thoracic Rotation - Reach Under  - 1 x daily - 7 x weekly - 3 sets - 10 reps ?- Half-Kneeling Forward Lean  - 1 x daily - 7 x weekly - 3 sets - 10 reps ?- Quadruped rock back  - 1 x daily - 7 x weekly - 3 sets - 10 reps ?- Dead Bug  - 1 x daily - 7 x weekly - 3 sets - 10 reps ?- Quadruped Ambulance person  - 1 x daily - 7 x weekly - 3 sets - 10 reps ?- Modified Superman on Table  - 1 x daily - 7 x weekly - 3 sets - 10 reps - 2second hold ?- Correct Standing Posture  - 1 x daily - 5 x weekly - 30 hold ?- Standing Balance with Eyes Closed on Foam  - 1 x daily - 5 x weekly - 3 sets - 30 hold ?- Standing march with overhead hold   - 1 x daily - 7 x weekly - 3 sets - 10 reps ?- Wall-facing squats - 1 x daily - 5 x weekly - 1-2 sets - 10 reps ? ? ? PT Short Term Goals - 01/06/22 0806   ? ?  ? PT SHORT TERM GOAL #1  ? Title Pt will be independent with initial HEP with wife's supervision in order to build upon functional gains made in therapy. ALL STGS DUE 02/03/22   ? Time 4   ? Period Weeks   ? Status Achieved   ? Target Date 02/03/22   ?  ? PT SHORT TERM GOAL #2  ? Title Pt will maintain gait speed of 2.8 ft/sec with LRAD with supervision in order to demo improved community ambulation.   ? Baseline 2.77 ft/sec with no AD - min guard; 3.3 ft/s without RW and 3.23 ft/s with AD  ? Time 4   ? Period Weeks   ? Status Achieved   ?  ? PT SHORT TERM GOAL #3  ? Title Pt will improve 5x sit <> stand with no UE support to 19 seconds or less in order to demo improved functional strength/decr fall risk.   ? Baseline 22.97 seconds, 12.5s on 3/21    ? Time 4   ? Period Weeks   ? Status Achieved   ?  ? PT SHORT TERM GOAL #4  ? Title Pt will ambulate at least 230' over level indoor  surfaces with LRAD with supervision for improved safety with mobility.   ? Baseline no AD- needs min guard and min A for balance at times; no AD, CGA   ? Time 4   ? Period Weeks   ? Status Acheived   ?  ?  PT SHORT TERM GOAL #5  ? Title Pt and pt's wife will verbalize understanding of fall prevention in the home.   ? Time 4   ? Period Weeks   ? Status Achieved   ? ?  ?  ? ?  ? ? ? PT Long Term Goals - 01/06/22 0818   ? ?  ? PT LONG TERM GOAL #1  ? Title Pt will be independent with final HEP with wife's supervision in order to build upon functional gains made in therapy. ALL LTGS DUE 03/03/22   ? Time 8   ? Period Weeks   ? Status ON GOING  ? Target Date 03/03/22   ?  ? PT LONG TERM GOAL #2  ? Title Pt will improve miniBEST to at least a 18/28 in order to demo decr fall risk.   ? Baseline 12/28; 21/28   ? Time 8   ? Period Weeks   ? Status MET   ?  ? PT LONG TERM GOAL #3  ? Title Pt will improve 5x sit <> stand with no UE support to 15 seconds or less in order to demo improved functional strength/decr fall risk.   ? Baseline 22.97 seconds; 11.63 seconds on 03/02/22  ? Time 8   ? Period Weeks   ? Status MET  ?  ? PT LONG TERM GOAL #4  ? Title Pt will ambulate at least 400' over outdoor paved surfaces with LRAD with mod I in order to demo improved community mobility.   ? Time 8   ? Period Weeks   ? Status  PARTIALLY MET  ?  ? PT LONG TERM GOAL #5  ? Title Pt will verbalize understanding of local Parkinson's disease resources,   ? Time 8   ? Period Weeks   ? Status MET  ?  ? Additional Long Term Goals  ? Additional Long Term Goals Yes   ?  ? PT LONG TERM GOAL #6  ? Title Pt will recover posterior and anterior balance in push and release test in 2 steps independently, for improved balance recovery   ? Time 8   ? Period Weeks   ? Status MET   ? ?  ?  ? ?  ? ?Updated LTGs going forwards for 12 week POC: ? PT Long Term Goals - 03/02/22 1431   ? ?  ? PT LONG TERM GOAL #1  ? Title Pt will be independent with final HEP with  wife's supervision in order to build upon functional gains made in therapy. ALL LTGS DUE 03/30/22   ? Time 12   ? Period Weeks   ? Status On-going   ? Target Date 03/30/22   ?  ? PT LONG TERM GOAL #2  ? Title Pt will impr

## 2022-03-14 NOTE — Patient Instructions (Signed)
? ?  Added to HEP - wall facing squat w/chair behind him for safety  ?

## 2022-03-17 ENCOUNTER — Ambulatory Visit: Payer: Medicare HMO | Admitting: Physical Therapy

## 2022-03-17 DIAGNOSIS — M6281 Muscle weakness (generalized): Secondary | ICD-10-CM

## 2022-03-17 DIAGNOSIS — R2689 Other abnormalities of gait and mobility: Secondary | ICD-10-CM

## 2022-03-17 DIAGNOSIS — R293 Abnormal posture: Secondary | ICD-10-CM | POA: Diagnosis not present

## 2022-03-17 DIAGNOSIS — R2681 Unsteadiness on feet: Secondary | ICD-10-CM

## 2022-03-17 NOTE — Therapy (Signed)
?OUTPATIENT PHYSICAL THERAPY TREATMENT NOTE  ? ? ?Patient Name: [name] ?MRN: [id number] ?DOB:07-Oct-1946, 76 y.o., male ?Today's Date: 03/17/2022 ? ?PCP: Lesleigh Noe, MD ?REFERRING PROVIDER: Star Age, MD  ? ? ? ? PT End of Session - 03/17/22 1104   ? ? Visit Number 21   ? Number of Visits 25   Plus eval  ? Date for PT Re-Evaluation 03/30/22   ? Authorization Type [employer]   ? Authorization Time Period 01/05/22 - 03/30/22; [employer] Approved 16 PT visits from 02/20/2022-05/15/2022   ? Progress Note Due on Visit 20   ? PT Start Time 1103   ? PT Stop Time 8563   ? PT Time Calculation (min) 39 min   ? Equipment Utilized During Treatment Gait belt   ? Activity Tolerance Patient tolerated treatment well   ? Behavior During Therapy [facility] for tasks assessed/performed   ? ?  ?  ? ?  ? ? ? ? ? ? ? ? ? ?Past Medical History:  ?Diagnosis Date  ? Agatston coronary artery calcium score greater than 400   ? Coronary calcium score of 533. This was 69th percentile for age,  ? Aortic atherosclerosis ([place])   ? Hyperlipidemia LDL goal <70   ? Parkinson disease ([place])   ? ?Past Surgical History:  ?Procedure Laterality Date  ? TONSILLECTOMY    ? ?Patient Active Problem List  ? Diagnosis Date Noted  ? 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 Aortic atherosclerosis ([place]) 02/01/2022  ? Irregular heart rate 01/12/2022  ? OSA (obstructive sleep apnea) 01/12/2022  ? Parkinson's disease ([place]) 11/25/2020  ? Edema of right lower leg 11/25/2020  ? Hyperlipidemia LDL goal <70 11/25/2020  ? 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 Hearing loss 11/25/2020  ? Pityriasis rosea 11/25/2020  ? ? ?REFERRING DIAG: G20 (ICD-10-CM) - Parkinson's disease ([place])  ? ?THERAPY DIAG:  ?Unsteadiness on feet ? ?Other abnormalities of gait and mobility ? ?Muscle weakness (generalized) ? ?PERTINENT HISTORY: HLD, cellulitis, lymphedema, low back pain and hearing loss. ? ?PRECAUTIONS: Fall ? ?SUBJECTIVE: No new changes. Everything is  going well. R ankle is feeling better today.  ? ?PAIN:  ?Are you having pain? No - R ankle is making it's presence known but no pain to report ? ? ?TODAY'S TREATMENT:  ? ?Gait Training  ?The following treadmill training was completed for aerobic/neural priming, endurance, step clearance/amplitude and gait speed.  ?- Warmup: 2:00 up to 2.1 mph ?- HIIT: 4:00 30sec ON/OFF alternating 2.1 mph and 3.0 mph. Regressed to 2.9 mph due to pt's inability to maintain upright posture w/increase in speed. Noted decreased step length of LLE>RLE. Therapist accidentally hit emergency stop button on round 2, requiring min A to prevent anterior LOB into TM. ?- Cool down: 90 sec at 1.8 mph ? ?Gait pattern: step through pattern, decreased step length- Left, decreased stance time- Right, decreased stride length, decreased hip/knee flexion- Right, decreased ankle dorsiflexion- Right, and trunk flexed ?Distance walked: Various clinic distances  ?Assistive device utilized: Environmental consultant - 4 wheeled ?Level of assistance: Modified independence ?Comments: Pt demonstrates good use of RW and self-corrects forward lean intermittently without need for cues  ? ? ?NMR  ?In // bars for improved ankle strategy, single leg stability, vestibular input and LE coordination (min guard throughout): ?-Standing on rockerboard w/EO and intermittent UE support w/emphasis on maintaining balance in the middle. Noted pt frequently leaning posteriorly but was able to maintain balance without UE  support 20-30s at a time  ?-Progressed to [facility] and pt unable to maintain balance >5s without UE support due to significant anterior lean. Educated pt on the 3 components of balance and importance of ankle strategy, as pt relies heavily on hip strategy.  ?-On balance beam:  ?-Fwd/retro tandem walking x3 each direction w/intermittent UE support. Noted difficulty properly placing feet on beam, more so in retro direction and commonly losing balance to L side  ?-Lateral stepping x3 each  direction with intermittent UE support. Noted pt dragging feet across beam, min cues for increased step clearance.  ?-Progressed to adding cones alongside beam and having pt tap cones w/each foot and then side step for improved single leg stability and coordination, x2 down and back. Pt unable to perform task without UE support and demonstrated exaggerated forward lean throughout. Pt attempted to tap x2 without UE support and knocked cone over.  ? ?PATIENT EDUCATION: ?Education details: Continuing HEP, practicing gait with and without rollator  ?Person educated: Patient ?Education method: Explanation  ?Education comprehension: verbalized understanding ? ? ?HOME EXERCISE PROGRAM: ?Access Code: [email or web address] ?URL: [email or web address] ?Date: 03/14/2022 ?Prepared by: Mickie Bail Taishaun Levels ? ?Exercises ?- Wall push up with strong push away  - 1 x daily - 7 x weekly - 3 sets - 10 reps ?- Quadruped Thoracic Rotation - Reach Under  - 1 x daily - 7 x weekly - 3 sets - 10 reps ?- Half-Kneeling Forward Lean  - 1 x daily - 7 x weekly - 3 sets - 10 reps ?- Quadruped rock back  - 1 x daily - 7 x weekly - 3 sets - 10 reps ?- Dead Bug  - 1 x daily - 7 x weekly - 3 sets - 10 reps ?- Quadruped Ambulance person  - 1 x daily - 7 x weekly - 3 sets - 10 reps ?- Modified Superman on Table  - 1 x daily - 7 x weekly - 3 sets - 10 reps - 2second hold ?- Correct Standing Posture  - 1 x daily - 5 x weekly - 30 hold ?- Standing Balance with Eyes Closed on Foam  - 1 x daily - 5 x weekly - 3 sets - 30 hold ?- Standing march with overhead hold   - 1 x daily - 7 x weekly - 3 sets - 10 reps ?- Wall-facing squats - 1 x daily - 5 x weekly - 1-2 sets - 10 reps ? ? ? PT Short Term Goals - 01/06/22 0806   ? ?  ? PT SHORT TERM GOAL #1  ? Title Pt will be independent with initial HEP with wife's supervision in order to build upon functional gains made in therapy. ALL STGS DUE 02/03/22   ? Time 4   ? Period Weeks   ? Status Achieved   ? Target Date 02/03/22    ?  ? PT SHORT TERM GOAL #2  ? Title Pt will maintain gait speed of 2.8 ft/sec with LRAD with supervision in order to demo improved community ambulation.   ? Baseline 2.77 ft/sec with no AD - min guard; 3.3 ft/s without RW and 3.23 ft/s with AD  ? Time 4   ? Period Weeks   ? Status Achieved   ?  ? PT SHORT TERM GOAL #3  ? Title Pt will improve 5x sit <> stand with no UE support to 19 seconds or less in order to demo improved functional strength/decr fall risk.   ? Baseline 22.97  seconds, 12.5s on 3/21    ? Time 4   ? Period Weeks   ? Status Achieved   ?  ? PT SHORT TERM GOAL #4  ? Title Pt will ambulate at least 230' over level indoor surfaces with LRAD with supervision for improved safety with mobility.   ? Baseline no AD- needs min guard and min A for balance at times; no AD, CGA   ? Time 4   ? Period Weeks   ? Status Acheived   ?  ? PT SHORT TERM GOAL #5  ? Title Pt and pt's wife will verbalize understanding of fall prevention in the home.   ? Time 4   ? Period Weeks   ? Status Achieved   ? ?  ?  ? ?  ? ? ? PT Long Term Goals - 01/06/22 0818   ? ?  ? PT LONG TERM GOAL #1  ? Title Pt will be independent with final HEP with wife's supervision in order to build upon functional gains made in therapy. ALL LTGS DUE 03/03/22   ? Time 8   ? Period Weeks   ? Status ON GOING  ? Target Date 03/03/22   ?  ? PT LONG TERM GOAL #2  ? Title Pt will improve miniBEST to at least a 18/28 in order to demo decr fall risk.   ? Baseline 12/28; 21/28   ? Time 8   ? Period Weeks   ? Status MET   ?  ? PT LONG TERM GOAL #3  ? Title Pt will improve 5x sit <> stand with no UE support to 15 seconds or less in order to demo improved functional strength/decr fall risk.   ? Baseline 22.97 seconds; 11.63 seconds on 03/02/22  ? Time 8   ? Period Weeks   ? Status MET  ?  ? PT LONG TERM GOAL #4  ? Title Pt will ambulate at least 400' over outdoor paved surfaces with LRAD with mod I in order to demo improved community mobility.   ? Time 8   ? Period  Weeks   ? Status  PARTIALLY MET  ?  ? PT LONG TERM GOAL #5  ? Title Pt will verbalize understanding of local Parkinson's disease resources,   ? Time 8   ? Period Weeks   ? Status MET  ?  ? Additional L

## 2022-03-21 ENCOUNTER — Ambulatory Visit: Payer: Medicare HMO | Admitting: Physical Therapy

## 2022-03-21 DIAGNOSIS — R293 Abnormal posture: Secondary | ICD-10-CM | POA: Diagnosis not present

## 2022-03-21 DIAGNOSIS — R2681 Unsteadiness on feet: Secondary | ICD-10-CM

## 2022-03-21 DIAGNOSIS — R2689 Other abnormalities of gait and mobility: Secondary | ICD-10-CM | POA: Diagnosis not present

## 2022-03-21 DIAGNOSIS — M6281 Muscle weakness (generalized): Secondary | ICD-10-CM | POA: Diagnosis not present

## 2022-03-21 NOTE — Therapy (Signed)
?OUTPATIENT PHYSICAL THERAPY TREATMENT NOTE  ? ? ?Patient Name: [name] ?MRN: [id number] ?DOB:06-14-1946, 75 y.o., male ?Today's Date: 03/21/2022 ? ?PCP: Lesleigh Noe, MD ?REFERRING PROVIDER: Star Age, MD  ? ? ? ? PT End of Session - 03/21/22 1309   ? ? Visit Number 22   ? Number of Visits 25   Plus eval  ? Date for PT Re-Evaluation 03/30/22   ? Authorization Type [employer] Medicare   ? Authorization Time Period 01/05/22 - 03/30/22; [employer] Approved 16 PT visits from 02/20/2022-05/15/2022   ? Progress Note Due on Visit 20   ? PT Start Time 6599   ? PT Stop Time 1355   ? PT Time Calculation (min) 48 min   ? Equipment Utilized During Treatment --   ? Activity Tolerance Patient tolerated treatment well   ? Behavior During Therapy [facility] for tasks assessed/performed   ? ?  ?  ? ?  ? ? ? ? ? ? ? ? ? ? ?Past Medical History:  ?Diagnosis Date  ? Agatston coronary artery calcium score greater than 400   ? Coronary calcium score of 533. This was 69th percentile for age,  ? Aortic atherosclerosis ([place])   ? Hyperlipidemia LDL goal <70   ? Parkinson disease ([place])   ? ?Past Surgical History:  ?Procedure Laterality Date  ? TONSILLECTOMY    ? ?Patient Active Problem List  ? Diagnosis Date Noted  ? 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 Aortic atherosclerosis ([place]) 02/01/2022  ? Irregular heart rate 01/12/2022  ? OSA (obstructive sleep apnea) 01/12/2022  ? Parkinson's disease ([place]) 11/25/2020  ? Edema of right lower leg 11/25/2020  ? Hyperlipidemia LDL goal <70 11/25/2020  ? 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 Hearing loss 11/25/2020  ? Pityriasis rosea 11/25/2020  ? ? ?REFERRING DIAG: G20 (ICD-10-CM) - Parkinson's disease ([place])  ? ?THERAPY DIAG:  ?Unsteadiness on feet ? ?Other abnormalities of gait and mobility ? ?Muscle weakness (generalized) ? ?PERTINENT HISTORY: HLD, cellulitis, lymphedema, low back pain and hearing loss. ? ?PRECAUTIONS: Fall ? ?SUBJECTIVE: Reports he had to ascend/descend  >60 steps this weekend that he was not expecting and caused his R ankle pain to flare up. Has been taking Aleve to assist with pain. No other changes  ? ?PAIN:  ?Are you having pain? No - R ankle is making it's presence known but no pain to report ? ? ?TODAY'S TREATMENT:  ?Ther Ex ?SciFit multi-peak hills level 14 for 9 minutes using BUE/BLEs for neural priming for reciprocal movement and dynamic cardiovascular warmup. ? ? ?STAIRS: ?Level of Assistance: Modified independence ?Stair Negotiation Technique: Step to Pattern ?Alternating Pattern  with Bilateral Rails ?Number of Stairs: 4 and 4  ?Height of Stairs: 6"  ?Comments: Practiced ascending/descending w/alternating pattern mod I and then educated pt on proper sequencing w/step-to pattern if R ankle begins to hurt. Pt able to demonstrate and teach back step-to pattern mod I w/rails.  ? ?NMR  ?Alt. Step taps to 6" step without UE support for improved single leg stability, LE coordination and strength. Pt performed x10 per side w/CGA, noted increased forward lean when tapping w/RLE and as pt fatigued. Pt able to self-correct posture throughout without cues. Min cues for "light stepping" to facilitate weight bearing on stance leg. Progressed to tapping bottom step then next step for prolonged single leg stance and improved hip flexion required. Pt w/significant difficulty tapping w/RLE and frequently unable to maintain upright posture,  leaning forward into rails.  ? ?Self-care/home management  ?Discussed options for exercise post-DC, including going back to the [facility]. Educated pt on benefits of SciFit on PD (emphasis on BDNF) and safe machines for pt to use. Pt verbalized understanding. Encouraged pt to perform ankle ABCs (not added to HEP) for ankle pain modulation and improved ROM.  ? ? ?PATIENT EDUCATION: ?Education details: Practicing toe taps w/step at home, see-self-care section ?Person educated: Patient ?Education method: Explanation  ?Education comprehension:  verbalized understanding ? ? ?HOME EXERCISE PROGRAM: ?Access Code: [email or web address] ?URL: [email or web address] ?Date: 03/14/2022 ?Prepared by: Mickie Bail Analiya Porco ? ?Exercises ?- Wall push up with strong push away  - 1 x daily - 7 x weekly - 3 sets - 10 reps ?- Quadruped Thoracic Rotation - Reach Under  - 1 x daily - 7 x weekly - 3 sets - 10 reps ?- Half-Kneeling Forward Lean  - 1 x daily - 7 x weekly - 3 sets - 10 reps ?- Quadruped rock back  - 1 x daily - 7 x weekly - 3 sets - 10 reps ?- Dead Bug  - 1 x daily - 7 x weekly - 3 sets - 10 reps ?- Quadruped Ambulance person  - 1 x daily - 7 x weekly - 3 sets - 10 reps ?- Modified Superman on Table  - 1 x daily - 7 x weekly - 3 sets - 10 reps - 2second hold ?- Correct Standing Posture  - 1 x daily - 5 x weekly - 30 hold ?- Standing Balance with Eyes Closed on Foam  - 1 x daily - 5 x weekly - 3 sets - 30 hold ?- Standing march with overhead hold   - 1 x daily - 7 x weekly - 3 sets - 10 reps ?- Wall-facing squats - 1 x daily - 5 x weekly - 1-2 sets - 10 reps ? ? ? PT Short Term Goals - 01/06/22 0806   ? ?  ? PT SHORT TERM GOAL #1  ? Title Pt will be independent with initial HEP with wife's supervision in order to build upon functional gains made in therapy. ALL STGS DUE 02/03/22   ? Time 4   ? Period Weeks   ? Status Achieved   ? Target Date 02/03/22   ?  ? PT SHORT TERM GOAL #2  ? Title Pt will maintain gait speed of 2.8 ft/sec with LRAD with supervision in order to demo improved community ambulation.   ? Baseline 2.77 ft/sec with no AD - min guard; 3.3 ft/s without RW and 3.23 ft/s with AD  ? Time 4   ? Period Weeks   ? Status Achieved   ?  ? PT SHORT TERM GOAL #3  ? Title Pt will improve 5x sit <> stand with no UE support to 19 seconds or less in order to demo improved functional strength/decr fall risk.   ? Baseline 22.97 seconds, 12.5s on 3/21    ? Time 4   ? Period Weeks   ? Status Achieved   ?  ? PT SHORT TERM GOAL #4  ? Title Pt will ambulate at least 230' over  level indoor surfaces with LRAD with supervision for improved safety with mobility.   ? Baseline no AD- needs min guard and min A for balance at times; no AD, CGA   ? Time 4   ? Period Weeks   ? Status Acheived   ?  ? PT SHORT TERM GOAL #5  ?  Title Pt and pt's wife will verbalize understanding of fall prevention in the home.   ? Time 4   ? Period Weeks   ? Status Achieved   ? ?  ?  ? ?  ? ? ? PT Long Term Goals - 01/06/22 0818   ? ?  ? PT LONG TERM GOAL #1  ? Title Pt will be independent with final HEP with wife's supervision in order to build upon functional gains made in therapy. ALL LTGS DUE 03/03/22   ? Time 8   ? Period Weeks   ? Status ON GOING  ? Target Date 03/03/22   ?  ? PT LONG TERM GOAL #2  ? Title Pt will improve miniBEST to at least a 18/28 in order to demo decr fall risk.   ? Baseline 12/28; 21/28   ? Time 8   ? Period Weeks   ? Status MET   ?  ? PT LONG TERM GOAL #3  ? Title Pt will improve 5x sit <> stand with no UE support to 15 seconds or less in order to demo improved functional strength/decr fall risk.   ? Baseline 22.97 seconds; 11.63 seconds on 03/02/22  ? Time 8   ? Period Weeks   ? Status MET  ?  ? PT LONG TERM GOAL #4  ? Title Pt will ambulate at least 400' over outdoor paved surfaces with LRAD with mod I in order to demo improved community mobility.   ? Time 8   ? Period Weeks   ? Status  PARTIALLY MET  ?  ? PT LONG TERM GOAL #5  ? Title Pt will verbalize understanding of local Parkinson's disease resources,   ? Time 8   ? Period Weeks   ? Status MET  ?  ? Additional Long Term Goals  ? Additional Long Term Goals Yes   ?  ? PT LONG TERM GOAL #6  ? Title Pt will recover posterior and anterior balance in push and release test in 2 steps independently, for improved balance recovery   ? Time 8   ? Period Weeks   ? Status MET   ? ?  ?  ? ?  ? ?Updated LTGs going forwards for 12 week POC: ? PT Long Term Goals - 03/02/22 1431   ? ?  ? PT LONG TERM GOAL #1  ? Title Pt will be independent with final  HEP with wife's supervision in order to build upon functional gains made in therapy. ALL LTGS DUE 03/30/22   ? Time 12   ? Period Weeks   ? Status On-going   ? Target Date 03/30/22   ?  ? PT LONG TERM GOAL #2

## 2022-03-23 ENCOUNTER — Ambulatory Visit: Payer: Medicare HMO | Admitting: Physical Therapy

## 2022-03-23 DIAGNOSIS — M6281 Muscle weakness (generalized): Secondary | ICD-10-CM

## 2022-03-23 DIAGNOSIS — R2689 Other abnormalities of gait and mobility: Secondary | ICD-10-CM | POA: Diagnosis not present

## 2022-03-23 DIAGNOSIS — R2681 Unsteadiness on feet: Secondary | ICD-10-CM | POA: Diagnosis not present

## 2022-03-23 DIAGNOSIS — R293 Abnormal posture: Secondary | ICD-10-CM | POA: Diagnosis not present

## 2022-03-23 NOTE — Therapy (Signed)
?OUTPATIENT PHYSICAL THERAPY TREATMENT NOTE  ? ? ?Patient Name: [name] ?MRN: [id number] ?DOB:[birth year], 76 y.o., male ?Today's Date: 03/23/2022 ? ?PCP: Lesleigh Noe, MD ?REFERRING PROVIDER: Star Age, MD  ? ? ? ? PT End of Session - 03/23/22 1319   ? ? Visit Number 23   ? Number of Visits 25   Plus eval  ? Date for PT Re-Evaluation 03/30/22   ? Authorization Type [employer]   ? Authorization Time Period 01/05/22 - 03/30/22; [employer] Approved 16 PT visits from 02/20/2022-05/15/2022   ? Progress Note Due on Visit 20   ? PT Start Time 0254   ? PT Stop Time 1405   ? PT Time Calculation (min) 48 min   ? Equipment Utilized During Treatment Gait belt   ? Activity Tolerance Patient tolerated treatment well   ? Behavior During Therapy [facility] for tasks assessed/performed   ? ?  ?  ? ?  ? ? ? ? ? ? ? ? ? ? ? ?Past Medical History:  ?Diagnosis Date  ? Agatston coronary artery calcium score greater than 400   ? Coronary calcium score of 533. This was 69th percentile for age,  ? Aortic atherosclerosis ([place])   ? Hyperlipidemia LDL goal <70   ? Parkinson disease ([place])   ? ?Past Surgical History:  ?Procedure Laterality Date  ? TONSILLECTOMY    ? ?Patient Active Problem List  ? Diagnosis Date Noted  ? 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 Aortic atherosclerosis ([place]) 02/01/2022  ? Irregular heart rate 01/12/2022  ? OSA (obstructive sleep apnea) 01/12/2022  ? Parkinson's disease ([place]) 11/25/2020  ? Edema of right lower leg 11/25/2020  ? Hyperlipidemia LDL goal <70 11/25/2020  ? 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 Hearing loss 11/25/2020  ? Pityriasis rosea 11/25/2020  ? ? ?REFERRING DIAG: G20 (ICD-10-CM) - Parkinson's disease ([place])  ? ?THERAPY DIAG:  ?Unsteadiness on feet ? ?Other abnormalities of gait and mobility ? ?Muscle weakness (generalized) ? ?PERTINENT HISTORY: HLD, cellulitis, lymphedema, low back pain and hearing loss. ? ?PRECAUTIONS: Fall ? ?SUBJECTIVE: Reports he had to  ascend/descend >60 steps this weekend that he was not expecting and caused his R ankle pain to flare up. Has been taking Aleve to assist with pain. No other changes  ? ?PAIN:  ?Are you having pain? No - R ankle is making it's presence known but no pain to report ? ? ?TODAY'S TREATMENT:  ?Ther Ex ?Added to HEP (see bolded below) for improved R ankle ROM and pain modulation to decrease interference w/gait kinematics. Pt able to demonstrate each well mod I.  ?  ?Gait Training  ?The following treadmill training was completed for aerobic/neural priming, endurance, and gait speed.  ?- Warmup: 2:00 up to 2.1 mph ?- HIIT: 5:00 30sec ON/OFF alternating 2.1 / 2.9 mph   ?-Cool down : 90 sec at 1.8 mph ? ?NMR  ?Boxing w/therapist and PT tech for UE/LE coordination, postural control, dual-tasking and stepping strategy:  ?-Forward step w/contralateral cross punch x10 per side. Pt demonstrated ipsilateral step and punch x3 times when stepping w/RLE, min cues to correct. One significant anterior LOB when punching w/RUE requiring mod A for steadying assist.  ?-Added cog-motor dual task (naming composers when stepping w/LLE and musical pieces w/RLE) and noted regression to ipsilateral step/punch, requiring min cues to correct after 3 incorrect punches. Pt required extra time for initiation w/dual-task.  ? ? ?PATIENT EDUCATION: ?Education details: Updates to  HEP, plan for next week  ?Person educated: Patient ?Education method: Explanation  ?Education comprehension: verbalized understanding ? ? ?HOME EXERCISE PROGRAM: ?Access Code: [email or web address] ?URL: [email or web address] ?Date: 03/14/2022 ?Prepared by: Mickie Bail Alexianna Nachreiner ? ?Exercises ?- Wall push up with strong push away  - 1 x daily - 7 x weekly - 3 sets - 10 reps ?- Quadruped Thoracic Rotation - Reach Under  - 1 x daily - 7 x weekly - 3 sets - 10 reps ?- Half-Kneeling Forward Lean  - 1 x daily - 7 x weekly - 3 sets - 10 reps ?- Quadruped rock back  - 1 x daily - 7 x weekly - 3  sets - 10 reps ?- Dead Bug  - 1 x daily - 7 x weekly - 3 sets - 10 reps ?- Quadruped Ambulance person  - 1 x daily - 7 x weekly - 3 sets - 10 reps ?- Modified Superman on Table  - 1 x daily - 7 x weekly - 3 sets - 10 reps - 2second hold ?- Correct Standing Posture  - 1 x daily - 5 x weekly - 30 hold ?- Standing Balance with Eyes Closed on Foam  - 1 x daily - 5 x weekly - 3 sets - 30 hold ?- Standing march with overhead hold   - 1 x daily - 7 x weekly - 3 sets - 10 reps ?- Wall-facing squats - 1 x daily - 5 x weekly - 1-2 sets - 10 reps ? ?Added on 03/23/22: ?- Seated Ankle Alphabet  - 1 x daily - 7 x weekly - 3 sets - 10 reps ?- Ankle Inversion with Resistance  - 1 x daily - 7 x weekly - 3 sets - 10 reps ?- Ankle Eversion with Resistance  - 1 x daily - 7 x weekly - 3 sets - 10 reps ?- Ankle Dorsiflexion with Resistance  - 1 x daily - 7 x weekly - 3 sets - 10 reps ?- Ankle and Toe Plantarflexion with Resistance  - 1 x daily - 7 x weekly - 3 sets - 10 reps ?- Calf stretch  - 1 x daily - 7 x weekly - 3 sets - 10 reps ? ? ? PT Short Term Goals - 01/06/22 0806   ? ?  ? PT SHORT TERM GOAL #1  ? Title Pt will be independent with initial HEP with wife's supervision in order to build upon functional gains made in therapy. ALL STGS DUE 02/03/22   ? Time 4   ? Period Weeks   ? Status Achieved   ? Target Date 02/03/22   ?  ? PT SHORT TERM GOAL #2  ? Title Pt will maintain gait speed of 2.8 ft/sec with LRAD with supervision in order to demo improved community ambulation.   ? Baseline 2.77 ft/sec with no AD - min guard; 3.3 ft/s without RW and 3.23 ft/s with AD  ? Time 4   ? Period Weeks   ? Status Achieved   ?  ? PT SHORT TERM GOAL #3  ? Title Pt will improve 5x sit <> stand with no UE support to 19 seconds or less in order to demo improved functional strength/decr fall risk.   ? Baseline 22.97 seconds, 12.5s on 3/21    ? Time 4   ? Period Weeks   ? Status Achieved   ?  ? PT SHORT TERM GOAL #4  ? Title Pt will ambulate at least 230'  over  level indoor surfaces with LRAD with supervision for improved safety with mobility.   ? Baseline no AD- needs min guard and min A for balance at times; no AD, CGA   ? Time 4   ? Period Weeks   ? Status Acheived   ?  ? PT SHORT TERM GOAL #5  ? Title Pt and pt's wife will verbalize understanding of fall prevention in the home.   ? Time 4   ? Period Weeks   ? Status Achieved   ? ?  ?  ? ?  ? ? ? PT Long Term Goals - 01/06/22 0818   ? ?  ? PT LONG TERM GOAL #1  ? Title Pt will be independent with final HEP with wife's supervision in order to build upon functional gains made in therapy. ALL LTGS DUE 03/03/22   ? Time 8   ? Period Weeks   ? Status ON GOING  ? Target Date 03/03/22   ?  ? PT LONG TERM GOAL #2  ? Title Pt will improve miniBEST to at least a 18/28 in order to demo decr fall risk.   ? Baseline 12/28; 21/28   ? Time 8   ? Period Weeks   ? Status MET   ?  ? PT LONG TERM GOAL #3  ? Title Pt will improve 5x sit <> stand with no UE support to 15 seconds or less in order to demo improved functional strength/decr fall risk.   ? Baseline 22.97 seconds; 11.63 seconds on 03/02/22  ? Time 8   ? Period Weeks   ? Status MET  ?  ? PT LONG TERM GOAL #4  ? Title Pt will ambulate at least 400' over outdoor paved surfaces with LRAD with mod I in order to demo improved community mobility.   ? Time 8   ? Period Weeks   ? Status  PARTIALLY MET  ?  ? PT LONG TERM GOAL #5  ? Title Pt will verbalize understanding of local Parkinson's disease resources,   ? Time 8   ? Period Weeks   ? Status MET  ?  ? Additional Long Term Goals  ? Additional Long Term Goals Yes   ?  ? PT LONG TERM GOAL #6  ? Title Pt will recover posterior and anterior balance in push and release test in 2 steps independently, for improved balance recovery   ? Time 8   ? Period Weeks   ? Status MET   ? ?  ?  ? ?  ? ?Updated LTGs going forwards for 12 week POC: ? PT Long Term Goals - 03/02/22 1431   ? ?  ? PT LONG TERM GOAL #1  ? Title Pt will be independent with  final HEP with wife's supervision in order to build upon functional gains made in therapy. ALL LTGS DUE 03/30/22   ? Time 12   ? Period Weeks   ? Status On-going   ? Target Date 03/30/22   ?  ? PT LONG TERM GO

## 2022-03-27 ENCOUNTER — Ambulatory Visit: Payer: Medicare HMO | Admitting: Physical Therapy

## 2022-03-27 ENCOUNTER — Other Ambulatory Visit: Payer: Medicare HMO | Admitting: *Deleted

## 2022-03-27 ENCOUNTER — Telehealth: Payer: Self-pay | Admitting: Family Medicine

## 2022-03-27 DIAGNOSIS — I700 Atherosclerosis of aorta: Secondary | ICD-10-CM | POA: Diagnosis not present

## 2022-03-27 DIAGNOSIS — R931 Abnormal findings on diagnostic imaging of heart and coronary circulation: Secondary | ICD-10-CM

## 2022-03-27 DIAGNOSIS — R2681 Unsteadiness on feet: Secondary | ICD-10-CM

## 2022-03-27 DIAGNOSIS — R2689 Other abnormalities of gait and mobility: Secondary | ICD-10-CM

## 2022-03-27 DIAGNOSIS — R293 Abnormal posture: Secondary | ICD-10-CM | POA: Diagnosis not present

## 2022-03-27 DIAGNOSIS — M6281 Muscle weakness (generalized): Secondary | ICD-10-CM | POA: Diagnosis not present

## 2022-03-27 LAB — LIPID PANEL
Chol/HDL Ratio: 1.8 ratio (ref 0.0–5.0)
Cholesterol, Total: 103 mg/dL (ref 100–199)
HDL: 57 mg/dL (ref >39)
LDL Chol Calc (NIH): 34 mg/dL (ref 0–99)
Triglycerides: 47 mg/dL (ref 0–149)
VLDL Cholesterol Cal: 12 mg/dL (ref 5–40)

## 2022-03-27 LAB — ALT: ALT: 8 IU/L (ref 0–44)

## 2022-03-27 NOTE — Telephone Encounter (Signed)
?  Left message for patient to call back and schedule Medicare Annual Wellness Visit (AWV) to be completed by video or phone. ? ?No hx of AWV eligible for AWVI per [employer] as of 10/14/19 ? ?Please schedule at anytime with  ?[facility]  ? ?45 Minutes appointment  ? ?Any questions, please call me at [phone]   ?

## 2022-03-27 NOTE — Therapy (Signed)
?OUTPATIENT PHYSICAL THERAPY TREATMENT NOTE  ? ? ?Patient Name: [name] ?MRN: [id number] ?DOB:[birth year], 76 y.o., male ?Today's Date: 03/27/2022 ? ?PCP: Lesleigh Noe, MD ?REFERRING PROVIDER: Star Age, MD  ? ? ? ? PT End of Session - 03/27/22 1308   ? ? Visit Number 24   ? Number of Visits 25   Plus eval  ? Date for PT Re-Evaluation 03/30/22   ? Authorization Type [employer]   ? Authorization Time Period 01/05/22 - 03/30/22; [employer] Approved 16 PT visits from 02/20/2022-05/15/2022   ? Progress Note Due on Visit 20   ? PT Start Time 4098   ? PT Stop Time 1191   ? PT Time Calculation (min) 45 min   ? Equipment Utilized During Treatment --   ? Activity Tolerance Patient tolerated treatment well   ? Behavior During Therapy [facility] for tasks assessed/performed   ? ?  ?  ? ?  ? ? ? ? ? ? ? ? ? ? ? ? ?Past Medical History:  ?Diagnosis Date  ? Agatston coronary artery calcium score greater than 400   ? Coronary calcium score of 533. This was 69th percentile for age,  ? Aortic atherosclerosis ([place])   ? Hyperlipidemia LDL goal <70   ? Parkinson disease ([place])   ? ?Past Surgical History:  ?Procedure Laterality Date  ? TONSILLECTOMY    ? ?Patient Active Problem List  ? Diagnosis Date Noted  ? 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 Aortic atherosclerosis ([place]) 02/01/2022  ? Irregular heart rate 01/12/2022  ? OSA (obstructive sleep apnea) 01/12/2022  ? Parkinson's disease ([place]) 11/25/2020  ? Edema of right lower leg 11/25/2020  ? Hyperlipidemia LDL goal <70 11/25/2020  ? 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 Hearing loss 11/25/2020  ? Pityriasis rosea 11/25/2020  ? ? ?REFERRING DIAG: G20 (ICD-10-CM) - Parkinson's disease ([place])  ? ?THERAPY DIAG:  ?Other abnormalities of gait and mobility ? ?Unsteadiness on feet ? ?Abnormal posture ? ?PERTINENT HISTORY: HLD, cellulitis, lymphedema, low back pain and hearing loss. ? ?PRECAUTIONS: Fall ? ?SUBJECTIVE: Reports he had to ascend/descend >60 steps  this weekend that he was not expecting and caused his R ankle pain to flare up. Has been taking Aleve to assist with pain. No other changes  ? ?PAIN:  ?Are you having pain? No - R ankle is making it's presence known but no pain to report ? ? ?TODAY'S TREATMENT:   ?Gait Training  ?The following treadmill training was completed for aerobic/neural priming, endurance, and gait speed.  ?- Warmup: 2:00 up to 2.1 mph ?- HIIT: 5:00 30sec ON/OFF alternating 2.1 / 2.9 mph   ?-Cool down : 90 sec at 1.8 mph ? ?Gait pattern: step through pattern, decreased stride length, decreased hip/knee flexion- Right, decreased hip/knee flexion- Left, decreased ankle dorsiflexion- Right, decreased ankle dorsiflexion- Left, trunk flexed, poor foot clearance- Right, and poor foot clearance- Left ?Distance walked: >1,000' outside around building  ?Assistive device utilized: Environmental consultant - 4 wheeled ?Level of assistance: Modified independence ?Comments: Pt demonstrated adequate step clearance bilaterally and good postural control on inclines/declines throughout. Pt maintained cadence throughout.  ? ? ?Ther Act  ? [facility] PT Assessment - 03/27/22 1335   ? ?  ? Transfers  ? Five time sit to stand comments  14.07s without UE support   ?  ? Ambulation/Gait  ? Gait velocity 32.8' over 8.37s = 3.9 ft/s without AD   ?  ? Timed Up  and Go Test  ? Normal TUG (seconds) 8.8   no AD  ? Cognitive TUG (seconds) 8.75   no AD  ? ?  ?  ? ?  ? ?  ?MCTSIB condition 4: 25.10s  ? ? ?PATIENT EDUCATION: ?Education details: Goal outcomes interpretation, plan for last visit  ?Person educated: Patient ?Education method: Explanation  ?Education comprehension: verbalized understanding ? ? ?HOME EXERCISE PROGRAM: ?Access Code: [email or web address] ?URL: [email or web address] ?Date: 03/14/2022 ?Prepared by: Mickie Bail Amariya Liskey ? ?Exercises ?- Wall push up with strong push away  - 1 x daily - 7 x weekly - 3 sets - 10 reps ?- Quadruped Thoracic Rotation - Reach Under  - 1 x daily - 7 x  weekly - 3 sets - 10 reps ?- Half-Kneeling Forward Lean  - 1 x daily - 7 x weekly - 3 sets - 10 reps ?- Quadruped rock back  - 1 x daily - 7 x weekly - 3 sets - 10 reps ?- Dead Bug  - 1 x daily - 7 x weekly - 3 sets - 10 reps ?- Quadruped Ambulance person  - 1 x daily - 7 x weekly - 3 sets - 10 reps ?- Modified Superman on Table  - 1 x daily - 7 x weekly - 3 sets - 10 reps - 2second hold ?- Correct Standing Posture  - 1 x daily - 5 x weekly - 30 hold ?- Standing Balance with Eyes Closed on Foam  - 1 x daily - 5 x weekly - 3 sets - 30 hold ?- Standing march with overhead hold   - 1 x daily - 7 x weekly - 3 sets - 10 reps ?- Wall-facing squats - 1 x daily - 5 x weekly - 1-2 sets - 10 reps ? ?Added on 03/23/22: ?- Seated Ankle Alphabet  - 1 x daily - 7 x weekly - 3 sets - 10 reps ?- Ankle Inversion with Resistance  - 1 x daily - 7 x weekly - 3 sets - 10 reps ?- Ankle Eversion with Resistance  - 1 x daily - 7 x weekly - 3 sets - 10 reps ?- Ankle Dorsiflexion with Resistance  - 1 x daily - 7 x weekly - 3 sets - 10 reps ?- Ankle and Toe Plantarflexion with Resistance  - 1 x daily - 7 x weekly - 3 sets - 10 reps ?- Calf stretch  - 1 x daily - 7 x weekly - 3 sets - 10 reps ? ? ? PT Short Term Goals - 01/06/22 0806   ? ?  ? PT SHORT TERM GOAL #1  ? Title Pt will be independent with initial HEP with wife's supervision in order to build upon functional gains made in therapy. ALL STGS DUE 02/03/22   ? Time 4   ? Period Weeks   ? Status Achieved   ? Target Date 02/03/22   ?  ? PT SHORT TERM GOAL #2  ? Title Pt will maintain gait speed of 2.8 ft/sec with LRAD with supervision in order to demo improved community ambulation.   ? Baseline 2.77 ft/sec with no AD - min guard; 3.3 ft/s without RW and 3.23 ft/s with AD  ? Time 4   ? Period Weeks   ? Status Achieved   ?  ? PT SHORT TERM GOAL #3  ? Title Pt will improve 5x sit <> stand with no UE support to 19 seconds or less in order to demo improved functional strength/decr  fall risk.   ?  Baseline 22.97 seconds, 12.5s on 3/21    ? Time 4   ? Period Weeks   ? Status Achieved   ?  ? PT SHORT TERM GOAL #4  ? Title Pt will ambulate at least 230' over level indoor surfaces with LRAD with supervision for improved safety with mobility.   ? Baseline no AD- needs min guard and min A for balance at times; no AD, CGA   ? Time 4   ? Period Weeks   ? Status Acheived   ?  ? PT SHORT TERM GOAL #5  ? Title Pt and pt's wife will verbalize understanding of fall prevention in the home.   ? Time 4   ? Period Weeks   ? Status Achieved   ? ?  ?  ? ?  ? ? ? PT Long Term Goals - 01/06/22 0818   ? ?  ? PT LONG TERM GOAL #1  ? Title Pt will be independent with final HEP with wife's supervision in order to build upon functional gains made in therapy. ALL LTGS DUE 03/03/22   ? Time 8   ? Period Weeks   ? Status ON GOING  ? Target Date 03/03/22   ?  ? PT LONG TERM GOAL #2  ? Title Pt will improve miniBEST to at least a 18/28 in order to demo decr fall risk.   ? Baseline 12/28; 21/28   ? Time 8   ? Period Weeks   ? Status MET   ?  ? PT LONG TERM GOAL #3  ? Title Pt will improve 5x sit <> stand with no UE support to 15 seconds or less in order to demo improved functional strength/decr fall risk.   ? Baseline 22.97 seconds; 11.63 seconds on 03/02/22  ? Time 8   ? Period Weeks   ? Status MET  ?  ? PT LONG TERM GOAL #4  ? Title Pt will ambulate at least 400' over outdoor paved surfaces with LRAD with mod I in order to demo improved community mobility.   ? Time 8   ? Period Weeks   ? Status  PARTIALLY MET  ?  ? PT LONG TERM GOAL #5  ? Title Pt will verbalize understanding of local Parkinson's disease resources,   ? Time 8   ? Period Weeks   ? Status MET  ?  ? Additional Long Term Goals  ? Additional Long Term Goals Yes   ?  ? PT LONG TERM GOAL #6  ? Title Pt will recover posterior and anterior balance in push and release test in 2 steps independently, for improved balance recovery   ? Time 8   ? Period Weeks   ? Status MET   ? ?  ?  ? ?   ? ?Updated LTGs going forwards for 12 week POC: ? PT Long Term Goals - 03/02/22 1431   ? ?  ? PT LONG TERM GOAL #1  ? Title Pt will be independent with final HEP with wife's supervision in order to build

## 2022-03-28 ENCOUNTER — Ambulatory Visit: Payer: Self-pay | Admitting: Physical Therapy

## 2022-03-29 ENCOUNTER — Ambulatory Visit: Payer: Medicare HMO | Admitting: Physical Therapy

## 2022-03-29 DIAGNOSIS — R293 Abnormal posture: Secondary | ICD-10-CM | POA: Diagnosis not present

## 2022-03-29 DIAGNOSIS — R2689 Other abnormalities of gait and mobility: Secondary | ICD-10-CM | POA: Diagnosis not present

## 2022-03-29 DIAGNOSIS — M6281 Muscle weakness (generalized): Secondary | ICD-10-CM | POA: Diagnosis not present

## 2022-03-29 DIAGNOSIS — R2681 Unsteadiness on feet: Secondary | ICD-10-CM | POA: Diagnosis not present

## 2022-03-29 NOTE — Therapy (Signed)
OUTPATIENT PHYSICAL THERAPY TREATMENT NOTE- DISCHARGE SUMMARY   Patient Name: [name] MRN: [id number] DOB:Sep 27, 1946, 76 y.o., male Today's Date: 03/30/2022  PCP: Lesleigh Noe, MD REFERRING PROVIDER: Star Age, MD   PHYSICAL THERAPY DISCHARGE SUMMARY  Visits from Start of Care: 25  Current functional level related to goals / functional outcomes: See below    Remaining deficits: Gait abnormalities 2/2 PD, abnormal posture    Education / Equipment: Extensive HEP, community PD resources   Patient agrees to discharge. Patient goals were met. Patient is being discharged due to meeting the stated rehab goals.      PT End of Session - 03/29/22 1325     Visit Number 25    Number of Visits 25   Plus eval   Date for PT Re-Evaluation 03/30/22    Authorization Type [employer]    Authorization Time Period 01/05/22 - 03/30/22; [employer] Approved 16 PT visits from 02/20/2022-05/15/2022    Progress Note Due on Visit 20    PT Start Time 1319    PT Stop Time 1400    PT Time Calculation (min) 41 min    Equipment Utilized During Treatment Gait belt    Activity Tolerance Patient tolerated treatment well    Behavior During Therapy WFL for tasks assessed/performed                       Past Medical History:  Diagnosis Date   Agatston coronary artery calcium score greater than 400    Coronary calcium score of 533. 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  PRECAUTIONS: Fall  SUBJECTIVE: States his R ankle is feeling better and he has no new changes to report. Feels ready to DC from PT today   PAIN:  Are you having pain? No   TODAY'S TREATMENT:   Gait Training  The following treadmill training was completed for aerobic/neural priming, endurance, and gait speed.  - Warmup: 2:00 up to 2.1 mph. Noted good step clearance, symmetric step length and reduced scissoring of RLE.  - HIIT: 3:00 30sec ON/OFF alternating 2.1 / 3.0 mph. Pt jogged w/speed of 3.0 and maintained soft steps throughout.  -Cool down : 90 sec at 1.8 mph  Ther Act   [facility] PT Assessment - 03/29/22 1337       Transfers   Five time sit to stand comments  12.6s without UE support             NMR Practiced floor transfer w/S* and min cues for sequencing. Pt able to teach back proper sequencing while performing. Noted good tall > half  kneel technique and strength.   Sled pushes (Natalie sitting in [place]) x115' for anterior weight shift, PF push-off strength and high amplitude movement. Stedy veers very strongly to L side but pt able to correct well, CGA throughout.    PATIENT EDUCATION: Education details: Emergency planning/management officer for PD, finalizing HEP, scheduling 83moscreen  Person educated: Patient Education method: Explanation  Education comprehension: verbalized understanding   HOME EXERCISE PROGRAM: Access Code: [email or web address] URL: [email or web address] Date: 03/14/2022 Prepared by: JMickie BailPlaster  Exercises - Wall push up with strong push away  - 1 x daily - 7 x weekly - 3 sets - 10 reps - Quadruped Thoracic Rotation -  Reach Under  - 1 x daily - 7 x weekly - 3 sets - 10 reps - Half-Kneeling Forward Lean  - 1 x daily - 7 x weekly - 3 sets - 10 reps - Quadruped rock back  - 1 x daily - 7 x weekly - 3 sets - 10 reps - Dead Bug  - 1 x daily - 7 x weekly - 3 sets - 10 reps - Quadruped Fire Hydrant  - 1 x daily - 7 x weekly - 3 sets - 10 reps - Modified Superman on Table  - 1 x daily - 7 x weekly - 3 sets - 10 reps - 2second hold - Correct Standing Posture  - 1 x daily - 5 x weekly - 30 hold - Standing Balance with Eyes Closed on Foam  - 1 x daily - 5 x weekly - 3 sets - 30 hold - Standing march with overhead hold   - 1 x daily - 7 x weekly - 3 sets - 10 reps - Wall-facing squats - 1 x daily - 5 x weekly - 1-2 sets - 10 reps  Added on 03/23/22: - Seated Ankle Alphabet  - 1 x daily - 7 x weekly - 3 sets - 10 reps - Ankle Inversion with Resistance  - 1 x daily - 7 x weekly - 3 sets - 10 reps - Ankle Eversion with Resistance  - 1 x daily - 7 x weekly - 3 sets - 10 reps - Ankle Dorsiflexion with Resistance  - 1 x daily - 7 x weekly - 3 sets - 10 reps - Ankle and Toe Plantarflexion with Resistance  - 1 x daily - 7 x weekly - 3 sets - 10 reps - Calf stretch  - 1 x daily - 7 x weekly - 3 sets - 10 reps    PT Short Term Goals - 01/06/22 04098      PT SHORT TERM GOAL #1   Title Pt will be independent with initial HEP with wife's supervision in order to build upon functional gains made in therapy. ALL STGS DUE 02/03/22    Time 4    Period Weeks    Status Achieved    Target Date 02/03/22      PT SHORT TERM GOAL #2   Title Pt will maintain gait speed of 2.8 ft/sec with LRAD with supervision in order to demo improved community ambulation.    Baseline 2.77 ft/sec with no AD - min guard; 3.3 ft/s without RW and 3.23 ft/s with AD   Time 4    Period Weeks    Status Achieved      PT SHORT TERM GOAL #3   Title Pt will improve 5x sit <> stand with no UE support to 19 seconds or less in order to demo improved  functional  strength/decr fall risk.    Baseline 22.97 seconds, 12.5s on 3/21     Time 4    Period Weeks    Status Achieved      PT SHORT TERM GOAL #4   Title Pt will ambulate at least 230' over level indoor surfaces with LRAD with supervision for improved safety with mobility.    Baseline no AD- needs min guard and min A for balance at times; no AD, CGA    Time 4    Period Weeks    Status Acheived      PT SHORT TERM GOAL #5   Title Pt and pt's wife will verbalize understanding of fall prevention in the home.    Time 4    Period Weeks    Status Achieved              PT Long Term Goals - 01/06/22 0818       PT LONG TERM GOAL #1   Title Pt will be independent with final HEP with wife's supervision in order to build upon functional gains made in therapy. ALL LTGS DUE 03/03/22    Time 8    Period Weeks    Status ON GOING   Target Date 03/03/22      PT LONG TERM GOAL #2   Title Pt will improve miniBEST to at least a 18/28 in order to demo decr fall risk.    Baseline 12/28; 21/28    Time 8    Period Weeks    Status MET      PT LONG TERM GOAL #3   Title Pt will improve 5x sit <> stand with no UE support to 15 seconds or less in order to demo improved functional strength/decr fall risk.    Baseline 22.97 seconds; 11.63 seconds on 03/02/22   Time 8    Period Weeks    Status MET     PT LONG TERM GOAL #4   Title Pt will ambulate at least 400' over outdoor paved surfaces with LRAD with mod I in order to demo improved community mobility.    Time 8    Period Weeks    Status  PARTIALLY MET     PT LONG TERM GOAL #5   Title Pt will verbalize understanding of local Parkinson's disease resources,    Time 8    Period Weeks    Status MET     Additional Long Term Goals   Additional Long Term Goals Yes      PT LONG TERM GOAL #6   Title Pt will recover posterior and anterior balance in push and release test in 2 steps independently, for improved balance recovery    Time 8    Period Weeks     Status MET            Updated LTGs going forwards for 12 week POC:  PT Long Term Goals - 03/02/22 1431       PT LONG TERM GOAL #1   Title Pt will be independent with final HEP with wife's supervision in order to build upon functional gains made in therapy. ALL LTGS DUE 03/30/22    Time 12    Period Weeks    Status MET   Target Date 03/30/22      PT LONG TERM GOAL #2   Title Pt will improve SLS time to at least 4-5 seconds bilat for improved balance.    Baseline 1 second; 1 second on  RLE, 2 seconds on LLE    Time 12    Period Weeks    Status NOT MET       PT LONG TERM GOAL #3   Title Pt will hold condition 4 of mCTSIB for at least 20 seconds for improved vestibular input for balance.    Baseline 13.4 seconds; 25.10s on 03/27/22   Time 12    Period Weeks    Status MET      PT LONG TERM GOAL #4   Title Pt will ambulate at least 600' over outdoor paved surfaces with LRAD with mod I in order to demo improved community mobility.    Time 12    Period Weeks    Status MET      PT LONG TERM GOAL #5   Title Pt will perform floor transfers with SBA for improved balance recovery.    Time 12    Period Weeks    Status MET              Plan - 01/26/22 1458     Clinical Impression Statement Emphasis of skilled PT session on LTG assessment and finalizing DC from PT. Pt met 4 of 5 LTGs, improving his safety w/floor transfers and balance on uneven surfaces. Pt did not achieve his SLS goal but did improve his stance time on LLE. Pt demonstrated ability to jog during treadmill training and has significantly improved his balance, gait and posture since eval. Pt very motivated to continue strength training/exercising post DC. Informed pt  on how to obtain new referral for PT within 3motime frame if mobility/balance changes. Pt verbalized understanding.    Personal Factors and Comorbidities Age;Fitness;Past/Current Experience;Comorbidity 1;Time since onset of  injury/illness/exacerbation    Comorbidities Cellulitis    Examination-Activity Limitations Bend;Carry;Reach Overhead;Locomotion Level;Lift;Stairs;Squat;Stand;Transfers    Examination-Participation Restrictions Community Activity    Stability/Clinical Decision Making Evolving/Moderate complexity    Rehab Potential Good    PT Frequency 2x / week    PT Duration 12 weeks    PT Treatment/Interventions ADLs/Self Care Home Management;Gait training;DME Instruction;Stair training;Functional mobility training;Therapeutic activities;Therapeutic exercise;Balance training;Neuromuscular re-education;Cognitive remediation;Patient/family education;Energy conservation;Vestibular    PT Home Exercise Plan [email or web address]    Consulted and Agree with Plan of Care Patient;Family member/caregiver    Family Member Consulted Wife, MKyra MangesPlaster, PT, DPT 03/30/2022, 9:28 AM

## 2022-04-05 ENCOUNTER — Encounter: Payer: Self-pay | Admitting: Physician Assistant

## 2022-04-05 ENCOUNTER — Ambulatory Visit: Payer: Medicare HMO | Admitting: Physician Assistant

## 2022-04-05 VITALS — BP 110/60 | HR 62 | Ht 75.0 in | Wt 244.2 lb

## 2022-04-05 DIAGNOSIS — Z8249 Family history of ischemic heart disease and other diseases of the circulatory system: Secondary | ICD-10-CM | POA: Diagnosis not present

## 2022-04-05 DIAGNOSIS — I700 Atherosclerosis of aorta: Secondary | ICD-10-CM

## 2022-04-05 DIAGNOSIS — I471 Supraventricular tachycardia: Secondary | ICD-10-CM

## 2022-04-05 DIAGNOSIS — Z9989 Dependence on other enabling machines and devices: Secondary | ICD-10-CM | POA: Diagnosis not present

## 2022-04-05 DIAGNOSIS — G4733 Obstructive sleep apnea (adult) (pediatric): Secondary | ICD-10-CM

## 2022-04-05 NOTE — Patient Instructions (Signed)
Medication Instructions:  Your physician recommends that you continue on your current medications as directed. Please refer to the Current Medication list given to you today.  *If you need a refill on your cardiac medications before your next appointment, please call your pharmacy*   Lab Work: NONE If you have labs (blood work) drawn today and your tests are completely normal, you will receive your results only by: MyChart Message (if you have MyChart) OR A paper copy in the mail If you have any lab test that is abnormal or we need to change your treatment, we will call you to review the results.   Testing/Procedures: NONE   Follow-Up: At [facility], you and your health needs are our priority.  As part of our continuing mission to provide you with exceptional heart care, we have created designated Provider Care Teams.  These Care Teams include your primary Cardiologist (physician) and Advanced Practice Providers (APPs -  Physician Assistants and Nurse Practitioners) who all work together to provide you with the care you need, when you need it.  We recommend signing up for the patient portal called "MyChart".  Sign up information is provided on this After Visit Summary.  MyChart is used to connect with patients for Virtual Visits (Telemedicine).  Patients are able to view lab/test results, encounter notes, upcoming appointments, etc.  Non-urgent messages can be sent to your provider as well.   To learn more about what you can do with MyChart, go to [email or web address].    Your next appointment:   1 year(s)  The format for your next appointment:   In Person  Provider:   Traci Turner, MD    Important Information About Sugar       

## 2022-04-05 NOTE — Progress Notes (Signed)
Cardiology Office Note:    Date:  04/05/2022   ID:  Donald, Bradley July 20, 1946, MRN [id number]  PCP:  Lesleigh Noe, MD  [facility] Cardiologist:  Fransico Him, MD  [place] Electrophysiologist:  None   Chief Complaint: follow up   History of Present Illness:    [name] is a 76 y.o. male with a hx of hyperlipidemia, Parkinson's disease, atrial tachycardia and obstructive sleep apnea seen for follow up.   Establish care with Dr. Radford Pax March 2023 for abnormal EKG during sleep study.  Patient had irregular heartbeat.  Patient has a family history of CAD.  CT cardiac Scoring 02/01/22 1. Coronary calcium score of 533. This was 69th percentile for age, gender, and race matched controls. 2.  Aortic atherosclerosis.  Monitor showed NSR with episodes of atrial tachycardia up to 46 seconds in duration.  started Toprol XL '25mg'$  daily.  Here today for follow up.  No complaints.  He is taking his medication.  Using CPAP mostly which is managed by neurology.  Denies chest pain, shortness of breath, orthopnea, PND, syncope, lower extremity edema or melena. Past Medical History:  Diagnosis Date   Agatston coronary artery calcium score greater than 400    Coronary calcium score of 533. This was 69th percentile for age,   Aortic atherosclerosis ([place])    Hyperlipidemia LDL goal <70    Parkinson disease ([place])     Past Surgical History:  Procedure Laterality Date   TONSILLECTOMY      Current Medications: Current Meds  Medication Sig   ASPIRIN 81 PO Take by mouth daily.   carbidopa-levodopa (SINEMET IR) 25-100 MG tablet Take 1 tablet by mouth 3 (three) times daily.   cetirizine (ZYRTEC ALLERGY) 10 MG tablet Take 1 tablet (10 mg total) by mouth daily.   Coenzyme Q10 (COQ10 PO) Take by mouth. 1 per day   desonide (DESOWEN) 0.05 % cream Apply topically 2 (two) times daily.   fenofibrate 160 MG tablet Take 1 tablet by mouth once daily   furosemide (LASIX) 10 MG/ML solution  Take 10 mg by mouth daily.   glucosamine-chondroitin 500-400 MG tablet Take 1 tablet by mouth daily.   metoprolol succinate (TOPROL XL) 25 MG 24 hr tablet Take 1 tablet (25 mg total) by mouth daily.   Multiple Vitamins-Minerals (MULTIVITAMIN WITH MINERALS) tablet Take 1 tablet by mouth daily.   mupirocin ointment (BACTROBAN) 2 % Apply topically as needed.   pramipexole (MIRAPEX) 1 MG tablet Take 1 tablet (1 mg total) by mouth in the morning, at noon, and at bedtime.   rosuvastatin (CRESTOR) 10 MG tablet Take 1 tablet (10 mg total) by mouth daily.     Allergies:   Patient has no known allergies.   Social History   Socioeconomic History   Marital status: Married    Spouse name: [name]   Number of children: 1   Years of education: master's degree   Highest education level: Not on file  Occupational History   Not on file  Tobacco Use   Smoking status: Some Days    Types: Pipe   Smokeless tobacco: Never   Tobacco comments:    EVERY SIX MONTHS   Vaping Use   Vaping Use: Never used  Substance and Sexual Activity   Alcohol use: Not Currently    Alcohol/week: 7.0 standard drinks    Types: 7 Cans of beer per week   Drug use: Never   Sexual activity: Yes  Birth control/protection: Post-menopausal  Other Topics Concern   Not on file  Social History Narrative   11/25/20   From: [place], but from this area, moved back 2022   Living: with wife, [name] [phone])   Work: retired Copywriter, advertising for [employer], [employer], played piano      Family: Cristen in [place], 2 granddaughters       Enjoys: piano, read, crosswords      Exercise: planning to go to the [employer]   Diet: good, yogurt, cereal, big meal at lunchtime, light dinner      Safety   Seat belts: Yes    Guns: no   Safe in relationships: Yes    Social Determinants of Radio broadcast assistant Strain: Not on file  Food Insecurity: Not on file  Transportation Needs: Not on file  Physical Activity: Not on  file  Stress: Not on file  Social Connections: Not on file     Family History: The patient's family history includes Arthritis in his maternal grandmother and mother; Diabetes in his daughter; Hearing loss in his father and mother; Heart attack (age of onset: 21) in his father; Heart disease in his maternal grandmother; Hyperlipidemia in his father; Hypertension in his maternal grandmother, mother, and sister; Prostate cancer in his father. There is no history of Sleep apnea.    ROS:   Please see the history of present illness.    All other systems reviewed and are negative.   EKGs/Labs/Other Studies Reviewed:    The following studies were reviewed today:  Monitor 02/2022 Predominant rhythm was normal sinus rhythm with average heart rate 76bpm and ranged from 34 to 136bpm. Occasional PACs and nonsustained atrial tachycardia with longest episode lasting 46.7sec and fastest episdoe 179bpm. Isolated PVC and ventricular couplets     Patch Wear Time:  13 days and 22 hours (2023-03-25T11:07:11-398 to 2023-04-08T09:59:18-0400)   Patient had a min HR of 34 bpm, max HR of 179 bpm, and avg HR of 76 bpm. Predominant underlying rhythm was Sinus Rhythm. First Degree AV Block was present. 31 Supraventricular Tachycardia runs occurred, the run with the fastest interval lasting 8 beats  with a max rate of 179 bpm, the longest lasting 46.7 secs with an avg rate of 99 bpm. Isolated SVEs were rare (<1.0%), SVE Couplets were rare (<1.0%), and SVE Triplets were rare (<1.0%). Isolated VEs were rare (<1.0%), VE Couplets were rare (<1.0%), and  no VE Triplets were present.    EKG:  EKG is not ordered today.   Recent Labs: 03/27/2022: ALT 8  Recent Lipid Panel    Component Value Date/Time   CHOL 103 03/27/2022 0808   TRIG 47 03/27/2022 0808   HDL 57 03/27/2022 0808   CHOLHDL 1.8 03/27/2022 0808   CHOLHDL 3 11/25/2020 1046   VLDL 17.0 11/25/2020 1046   LDLCALC 34 03/27/2022 0808    Physical Exam:     VS:  BP 110/60   Pulse 62   Ht '6\' 3"'$  (1.905 m)   Wt 244 lb 3.2 oz (110.8 kg)   SpO2 97%   BMI 30.52 kg/m     Wt Readings from Last 3 Encounters:  04/05/22 244 lb 3.2 oz (110.8 kg)  03/13/22 250 lb 6.4 oz (113.6 kg)  02/01/22 243 lb (110.2 kg)     GEN:  Well nourished, well developed in no acute distress HEENT: Normal NECK: No JVD; No carotid bruits LYMPHATICS: No lymphadenopathy CARDIAC: RRR, no murmurs, rubs, gallops RESPIRATORY:  Clear to  auscultation without rales, wheezing or rhonchi  ABDOMEN: Soft, non-tender, non-distended MUSCULOSKELETAL:  No edema; No deformity  SKIN: Warm and dry NEUROLOGIC:  Alert and oriented x 3 PSYCHIATRIC:  Normal affect   ASSESSMENT AND PLAN:    Atrial tachycardia Initially found to have irregular heart rate on sleep study.  Monitor showed atrial tachycardia.  He was asymptomatic. Continue Toprol-XL 25 mg daily.  Hopefully this will improve with initiation of beta-blocker and using CPAP.  2.  Aortic atherosclerosis -No anginal symptoms.  Continue aspirin and statin.  3.  Obstructive sleep apnea -Compliant with CPAP.  Managed by neurology.  Medication Adjustments/Labs and Tests Ordered: Current medicines are reviewed at length with the patient today.  Concerns regarding medicines are outlined above.  No orders of the defined types were placed in this encounter.  No orders of the defined types were placed in this encounter.   Patient Instructions  Medication Instructions:  Your physician recommends that you continue on your current medications as directed. Please refer to the Current Medication list given to you today.  *If you need a refill on your cardiac medications before your next appointment, please call your pharmacy*   Lab Work: NONE If you have labs (blood work) drawn today and your tests are completely normal, you will receive your results only by: [place] (if you have MyChart) OR A paper copy in the mail If  you have any lab test that is abnormal or we need to change your treatment, we will call you to review the results.   Testing/Procedures: NONE   Follow-Up: At [facility], you and your health needs are our priority.  As part of our continuing mission to provide you with exceptional heart care, we have created designated Provider Care Teams.  These Care Teams include your primary Cardiologist (physician) and Advanced Practice Providers (APPs -  Physician Assistants and Nurse Practitioners) who all work together to provide you with the care you need, when you need it.  We recommend signing up for the patient portal called "MyChart".  Sign up information is provided on this After Visit Summary.  MyChart is used to connect with patients for Virtual Visits (Telemedicine).  Patients are able to view lab/test results, encounter notes, upcoming appointments, etc.  Non-urgent messages can be sent to your provider as well.   To learn more about what you can do with MyChart, go to [email or web address].    Your next appointment:   1 year(s)  The format for your next appointment:   In Person  Provider:   Fransico Him, MD {   Important Information About Sugar         Jarrett Soho, [place]  04/05/2022 10:43 AM    [place]

## 2022-04-19 ENCOUNTER — Encounter: Payer: Self-pay | Admitting: Family Medicine

## 2022-04-19 ENCOUNTER — Ambulatory Visit (INDEPENDENT_AMBULATORY_CARE_PROVIDER_SITE_OTHER): Payer: Medicare HMO | Admitting: Family Medicine

## 2022-04-19 VITALS — BP 100/50 | HR 80 | Temp 97.9°F | Ht 71.0 in | Wt 240.5 lb

## 2022-04-19 DIAGNOSIS — Z0000 Encounter for general adult medical examination without abnormal findings: Secondary | ICD-10-CM | POA: Diagnosis not present

## 2022-04-19 DIAGNOSIS — E785 Hyperlipidemia, unspecified: Secondary | ICD-10-CM | POA: Diagnosis not present

## 2022-04-19 DIAGNOSIS — Z125 Encounter for screening for malignant neoplasm of prostate: Secondary | ICD-10-CM | POA: Diagnosis not present

## 2022-04-19 LAB — COMPREHENSIVE METABOLIC PANEL
ALT: 11 U/L (ref 0–53)
AST: 21 U/L (ref 0–37)
Albumin: 4 g/dL (ref 3.5–5.2)
Alkaline Phosphatase: 24 U/L — ABNORMAL LOW (ref 39–117)
BUN: 17 mg/dL (ref 6–23)
CO2: 26 mEq/L (ref 19–32)
Calcium: 9.6 mg/dL (ref 8.4–10.5)
Chloride: 108 mEq/L (ref 96–112)
Creatinine, Ser: 0.8 mg/dL (ref 0.40–1.50)
GFR: 87.46 mL/min (ref >60.00)
Glucose, Bld: 91 mg/dL (ref 70–99)
Potassium: 4.1 mEq/L (ref 3.5–5.1)
Sodium: 141 mEq/L (ref 135–145)
Total Bilirubin: 0.5 mg/dL (ref 0.2–1.2)
Total Protein: 6.6 g/dL (ref 6.0–8.3)

## 2022-04-19 LAB — PSA, MEDICARE: PSA: 0.23 ng/ml (ref 0.10–4.00)

## 2022-04-19 NOTE — Patient Instructions (Signed)
Check with insurance on hepatitis C screening

## 2022-04-19 NOTE — Progress Notes (Signed)
Subjective:   [name] is a 76 y.o. male who presents for Medicare Annual/Subsequent preventive examination.  Review of Systems    Review of Systems  Constitutional:  Negative for chills and fever.  HENT:  Negative for congestion and sore throat.   Eyes:  Negative for blurred vision and double vision.  Respiratory:  Negative for shortness of breath.   Cardiovascular:  Negative for chest pain.  Gastrointestinal:  Negative for heartburn, nausea and vomiting.  Genitourinary: Negative.   Musculoskeletal: Negative.  Negative for myalgias.  Skin:  Negative for rash.  Neurological:  Negative for dizziness and headaches.  Endo/Heme/Allergies:  Does not bruise/bleed easily.  Psychiatric/Behavioral:  Negative for depression. The patient is not nervous/anxious.    Cardiac Risk Factors include: advanced age (>67mn, >>24women);hypertension;smoking/ tobacco exposure     Objective:    Today's Vitals   04/19/22 0846  BP: (!) 100/50  Pulse: 80  Temp: 97.9 F (36.6 C)  TempSrc: Temporal  SpO2: 96%  Weight: 240 lb 8 oz (109.1 kg)  Height: '5\' 11"'$  (1.803 m)   Body mass index is 33.54 kg/m.     04/19/2022    8:59 AM 01/05/2022   11:06 AM  Advanced Directives  Does Patient Have a Medical Advance Directive? No No  Would patient like information on creating a medical advance directive? Yes (MAU/Ambulatory/Procedural Areas - Information given) Yes (MAU/Ambulatory/Procedural Areas - Information given)    Current Medications (verified) Outpatient Encounter Medications as of 04/19/2022  Medication Sig   ASPIRIN 81 PO Take by mouth daily.   carbidopa-levodopa (SINEMET IR) 25-100 MG tablet Take 1 tablet by mouth 3 (three) times daily.   cetirizine (ZYRTEC ALLERGY) 10 MG tablet Take 1 tablet (10 mg total) by mouth daily.   Coenzyme Q10 (COQ10 PO) Take by mouth. 1 per day   desonide (DESOWEN) 0.05 % cream Apply topically 2 (two) times daily.   fenofibrate 160 MG tablet Take 1 tablet by  mouth once daily   furosemide (LASIX) 10 MG/ML solution Take 10 mg by mouth daily.   glucosamine-chondroitin 500-400 MG tablet Take 1 tablet by mouth daily.   metoprolol succinate (TOPROL XL) 25 MG 24 hr tablet Take 1 tablet (25 mg total) by mouth daily.   Multiple Vitamins-Minerals (MULTIVITAMIN WITH MINERALS) tablet Take 1 tablet by mouth daily.   mupirocin ointment (BACTROBAN) 2 % Apply topically as needed.   pramipexole (MIRAPEX) 1 MG tablet Take 1 tablet (1 mg total) by mouth in the morning, at noon, and at bedtime.   rosuvastatin (CRESTOR) 10 MG tablet Take 1 tablet (10 mg total) by mouth daily.   [DISCONTINUED] aspirin 81 MG chewable tablet Chew 81 mg by mouth daily.   No facility-administered encounter medications on file as of 04/19/2022.    Allergies (verified) Patient has no known allergies.   History: Past Medical History:  Diagnosis Date   Agatston coronary artery calcium score greater than 400    Coronary calcium score of 533. This was 69th percentile for age,   [place] ([place]    Hyperlipidemia LDL goal <70    Parkinson disease ([place]    Past Surgical History:  Procedure Laterality Date   TONSILLECTOMY     Family History  Problem Relation Age of Onset   Arthritis Mother    Hearing loss Mother    Hypertension Mother    Prostate cancer Father    Hearing loss Father    Heart attack Father 878  Hyperlipidemia Father  Hypertension Sister    Heart disease Maternal Grandmother    Hypertension Maternal Grandmother    Arthritis Maternal Grandmother    Diabetes Daughter    Sleep apnea Neg Hx    Social History   Socioeconomic History   Marital status: Married    Spouse name: [name]   Number of children: 1   Years of education: master's degree   Highest education level: Not on file  Occupational History   Not on file  Tobacco Use   Smoking status: Some Days    Types: Pipe   Smokeless tobacco: Never   Tobacco comments:    EVERY SIX MONTHS    Vaping Use   Vaping Use: Never used  Substance and Sexual Activity   Alcohol use: Not Currently    Alcohol/week: 7.0 standard drinks    Types: 7 Cans of beer per week   Drug use: Never   Sexual activity: Yes    Birth control/protection: Post-menopausal  Other Topics Concern   Not on file  Social History Narrative   11/25/20   From: [place], but from this area, moved back 2022   Living: with wife, [name] (1980)   Work: retired Copywriter, advertising for [employer], [employer] Service, played piano      Family: Copy in [place], 2 granddaughters       Enjoys: piano, read, crosswords      Exercise: planning to go to the [employer]   Diet: good, yogurt, cereal, big meal at lunchtime, light dinner      Safety   Seat belts: Yes    Guns: no   Safe in relationships: Yes    Social Determinants of Radio broadcast assistant Strain: Not on file  Food Insecurity: Not on file  Transportation Needs: Not on file  Physical Activity: Not on file  Stress: Not on file  Social Connections: Not on file    Tobacco Counseling Ready to quit: Not Answered Counseling given: Not Answered Tobacco comments: EVERY SIX MONTHS    Clinical Intake:  Pre-visit preparation completed: No  Pain : No/denies pain     BMI - recorded: 33.54 Nutritional Status: BMI > 30  Obese Nutritional Risks: None Diabetes: No  How often do you need to have someone help you when you read instructions, pamphlets, or other written materials from your doctor or pharmacy?: 1 - Never What is the last grade level you completed in school?: post graduate  Diabetic?no  Interpreter Needed?: No      Activities of Daily Living    04/19/2022    9:00 AM  In your present state of health, do you have any difficulty performing the following activities:  Hearing? 0  Vision? 0  Difficulty concentrating or making decisions? 0  Walking or climbing stairs? 1  Comment climbing stairs - one level home  Dressing or bathing?  0  Doing errands, shopping? 0  Preparing Food and eating ? N  Using the Toilet? N  In the past six months, have you accidently leaked urine? Y  Do you have problems with loss of bowel control? N  Managing your Medications? N  Managing your Finances? N  Housekeeping or managing your Housekeeping? N    Patient Care Team: Lesleigh Noe, MD as PCP - General (Family Medicine) Sueanne Margarita, MD as PCP - Cardiology (Cardiology) Star Age, MD as Attending Physician (Neurology) Arville Care, MD as Referring Physician (Dermatology)  Indicate any recent Medical Services you may have received from other than  [facility] providers in the past year (date may be approximate).     Assessment:   This is a routine wellness examination for Render.  Hearing/Vision screen Vision Screening - Comments:: Last eye exam end of 2022 at [facility]   Dietary issues and exercise activities discussed: Current Exercise Habits: Home exercise routine, Type of exercise: strength training/weights;Other - see comments (PT and bicycle daily), Time (Minutes): 60, Frequency (Times/Week): 7, Weekly Exercise (Minutes/Week): 420, Intensity: Moderate, Exercise limited by: neurologic condition(s);orthopedic condition(s)   Goals Addressed             This Visit's Progress    Patient Stated       Continue to work on walking      Depression Screen    04/19/2022    9:32 AM 04/19/2022    9:00 AM 11/25/2020   10:42 AM  PHQ 2/9 Scores  PHQ - 2 Score 0 0 0    Fall Risk    01/12/2022    9:56 AM 11/25/2020   10:02 AM  [place] in the past year? 0 0  Number falls in past yr: 0 0  Risk for fall due to : Impaired balance/gait     FALL RISK PREVENTION PERTAINING TO THE HOME:  Any stairs in or around the home? No  If so, are there any without handrails?  N/a Home free of loose throw rugs in walkways, pet beds, electrical cords, etc? Yes  Adequate lighting in your home to reduce risk of falls? Yes    ASSISTIVE DEVICES UTILIZED TO PREVENT FALLS:  Life alert? No  Use of a cane, walker or w/c? Yes  Grab bars in the bathroom? No  Shower chair or bench in shower? Yes  Elevated toilet seat or a handicapped toilet? Yes   Cognitive Function:      Mini-Cog - 04/19/22 0903     Normal clock drawing test? yes    How many words correct? 3               Immunizations Immunization History  Administered Date(s) Administered   Fluad Quad(high Dose 65+) 08/07/2020, 09/10/2021   Moderna Covid-19 Vaccine Bivalent Booster 69yr & up 09/25/2021   Moderna Sars-Covid-2 Vaccination 12/03/2019, 01/06/2020, 09/04/2020   Pneumococcal Conjugate-13 08/28/2017   Pneumococcal Polysaccharide-23 11/09/2018   Td 10/22/2003   Tdap 06/27/2019   Zoster Recombinat (Shingrix) 09/14/2018, 04/30/2019    TDAP status: Up to date  Flu Vaccine status: Up to date  Pneumococcal vaccine status: Up to date  Covid-19 vaccine status: Completed vaccines  Qualifies for Shingles Vaccine? Yes   Zostavax completed Yes   Shingrix Completed?: Yes  Screening Tests Health Maintenance  Topic Date Due   Hepatitis C Screening  Never done   INFLUENZA VACCINE  06/13/2022   COLONOSCOPY (Pts 45-432yrInsurance coverage will need to be confirmed)  11/05/2023   TETANUS/TDAP  06/26/2029   Pneumonia Vaccine 6559Years old  Completed   COVID-19 Vaccine  Completed   Zoster Vaccines- Shingrix  Completed   HPV VACCINES  Aged Out    Health Maintenance  Health Maintenance Due  Topic Date Due   Hepatitis C Screening  Never done    Colorectal cancer screening: Type of screening: Colonoscopy. Completed 2019. Repeat every 5 years  Lung Cancer Screening: (Low Dose CT Chest recommended if Age 671-80ears, 30 pack-year currently smoking OR have quit w/in 15years.) does not qualify.   Lung Cancer Screening Referral: n/a  Social History  Tobacco Use  Smoking Status Some Days   Types: Pipe  Smokeless Tobacco Never   Tobacco Comments   EVERY SIX MONTHS      Additional Screening:  Hepatitis C Screening: does qualify; Completed not sure if they got this done  Vision Screening: Recommended annual ophthalmology exams for early detection of glaucoma and other disorders of the eye. Is the patient up to date with their annual eye exam?  Yes    Dental Screening: Recommended annual dental exams for proper oral hygiene  Community Resource Referral / Chronic Care Management: CRR required this visit?  No   CCM required this visit?  No      Plan:     I have personally reviewed and noted the following in the patient's chart:   Medical and social history Use of alcohol, tobacco or illicit drugs  Current medications and supplements including opioid prescriptions. Patient is not currently taking opioid prescriptions. Functional ability and status Nutritional status Physical activity Advanced directives List of other physicians Hospitalizations, surgeries, and ER visits in previous 12 months Vitals Screenings to include cognitive, depression, and falls Referrals and appointments  In addition, I have reviewed and discussed with patient certain preventive protocols, quality metrics, and best practice recommendations. A written personalized care plan for preventive services as well as general preventive health recommendations were provided to patient.     Lesleigh Noe, MD   04/19/2022

## 2022-05-04 ENCOUNTER — Other Ambulatory Visit: Payer: Self-pay

## 2022-05-04 NOTE — Telephone Encounter (Signed)
Patient was given by walk in clinic in the past wanted to know if Dr. Einar Pheasant would refill for her.

## 2022-05-05 MED ORDER — CETIRIZINE HCL 10 MG PO TABS
10.0000 mg | ORAL_TABLET | Freq: Every day | ORAL | 0 refills | Status: DC
Start: 2022-05-05 — End: 2022-11-21

## 2022-05-08 ENCOUNTER — Other Ambulatory Visit: Payer: Self-pay | Admitting: Family Medicine

## 2022-06-22 NOTE — Progress Notes (Signed)
[facility] [address] [place]. [place] [address] [phone]       OFFICE FOLLOW UP NOTE  [name] Date of Birth:  Feb 16, 1946 Medical Record Number:  [id number]   Reason for visit: Initial CPAP follow-up    SUBJECTIVE:   CHIEF COMPLAINT:  Chief Complaint  Patient presents with   Follow-up    RM 3 with spouse [name] Pt is well, having issues with CPAP functioning correctly.  Parkinson's has been fairly stable.     HPI:   Update 06/22/2022 JM: Patient returns for follow-up visit after prior visit with Dr. Rexene Bradley 3 months ago for CPAP management and Parkinson's disease.  Accompanied by his wife, [name]  In regards to CPAP, pressure adjustments made at prior visit due to elevated AHI.  Review of compliance report from the past 30 days shows 30 out of 30 usage days with 30 days greater than 4 hours for 100% compliance.  Average usage 5 hours and 19 minutes.  Residual AHI 4.0 on AutoPap settings 7-13 with EPR level 2.  Pressure in the 95th percentile 11.  Leaks in the 95th percentile 16.6.  He is concerned that he has not been getting the reports on his phone each day. Wife notes that she will wake up and hear mask leaking, usually his machine will say if mask needs to be adjusted or not on the display but it has not been recently doing this. He was concerned that we may not be getting the reports. He has tried to reach out to DME [employer] [facility], they keep getting told they will look into this issue but nothing yet been resolved, this has been going on for other 1 month.  He otherwise is tolerating well. He does still have daytime somnolence. ESS 8/24.    In regards to Parkinson's, he has been stable.  Remains on Sinemet 25-'100mg'$  1 tab 3 times daily and pramipexole 1 mg 3 times daily.  Completed PT back in May and continue to do exercises at home.   Continued gait impairment but believes this has been stable although per wife, slightly declined in regards  to not being as stable when making turns. Use of rollator walker, has difficulty ambulating with device. Questions use of cane (StrongArm comfort cane). He is interested in restarting PT. Constipation persists, currently using Miralax every other day.  Continued daytime somnolence as well as insomnia, unchanged since prior visit. Wife requesting renewal of handicap placard.         History provided for reference purposes only Update 03/13/2022 Dr. Rexene Bradley: I reviewed his AutoPap compliance data from 02/07/2022 through 03/08/2022, which is a total of 30 days, during which time he used his machine every night with percent use days greater than 4 hours at 97%, indicating excellent compliance with an average usage of 5 hours and 28 minutes, residual AHI elevated at 21.3/h, mostly obstructive in nature, central apnea index of 2.9/h, pressure of 5 to 11 cm with EPR of 2.  Average pressure for the 95th percentile at 10.8 cm, maximum 11 cm, leak on the higher side with the 95th percentile at 25.9 L/min.  He is still adjusting to treatment.  He reports feeling a little better, wife still is worried about his daytime somnolence.  He is using an AirTouch fullface mask but it does pinch his nasal bridge.  He is looking into getting a gecko nasal gel for from [facility].  Would be willing to increase his pressure setting.  The pressure does not bother him.  He does have dry mouth.  He uses the humidifier in the machine consistently. He reports feeling fairly stable. He finished outpt OT and ST through neuro rehab and has ongoing PT. He tries to hydrate well with water. He exercises on a regular basis and uses a stationary bike.  He has constipation and uses prunes and a laxative prn. No recent falls.     ROS:   14 system review of systems performed and negative with exception of those listed in HPI  PMH:  Past Medical History:  Diagnosis Date   Agatston coronary artery calcium score greater than 400    Coronary  calcium score of 533. This was 69th percentile for age,   Aortic atherosclerosis ([place])    Hyperlipidemia LDL goal <70    Parkinson disease ([place])     PSH:  Past Surgical History:  Procedure Laterality Date   TONSILLECTOMY      Social History:  Social History   Socioeconomic History   Marital status: Married    Spouse name: [name]   Number of children: 1   Years of education: master's degree   Highest education level: Not on file  Occupational History   Not on file  Tobacco Use   Smoking status: Some Days    Types: Pipe   Smokeless tobacco: Never   Tobacco comments:    EVERY SIX MONTHS   Vaping Use   Vaping Use: Never used  Substance and Sexual Activity   Alcohol use: Not Currently    Alcohol/week: 7.0 standard drinks of alcohol    Types: 7 Cans of beer per week   Drug use: Never   Sexual activity: Yes    Birth control/protection: Post-menopausal  Other Topics Concern   Not on file  Social History Narrative   11/25/20   From: [place], but from this area, moved back 2022   Living: with wife, [name] (1980)   Work: retired Copywriter, advertising for [employer], [employer] Service, played piano      Family: [name] in [place], 2 granddaughters       Enjoys: piano, read, crosswords      Exercise: planning to go to the [employer]   Diet: good, yogurt, cereal, big meal at lunchtime, light dinner      Safety   Seat belts: Yes    Guns: no   Safe in relationships: Yes    Social Determinants of Radio broadcast assistant Strain: Not on file  Food Insecurity: Not on file  Transportation Needs: Not on file  Physical Activity: Not on file  Stress: Not on file  Social Connections: Not on file  Intimate Partner Violence: Not on file    Family History:  Family History  Problem Relation Age of Onset   Arthritis Mother    Hearing loss Mother    Hypertension Mother    Prostate cancer Father    Hearing loss Father    Heart attack Father 59   Hyperlipidemia Father     Hypertension Sister    Heart disease Maternal Grandmother    Hypertension Maternal Grandmother    Arthritis Maternal Grandmother    Diabetes Daughter    Sleep apnea Neg Hx     Medications:   Current Outpatient Medications on File Prior to Visit  Medication Sig Dispense Refill   ASPIRIN 81 PO Take by mouth daily.     carbidopa-levodopa (SINEMET IR) 25-100 MG tablet Take 1 tablet by mouth 3 (three) times  daily. 270 tablet 3   cetirizine (ZYRTEC ALLERGY) 10 MG tablet Take 1 tablet (10 mg total) by mouth daily. 90 tablet 0   Coenzyme Q10 (COQ10 PO) Take by mouth. 1 per day     desonide (DESOWEN) 0.05 % cream Apply topically 2 (two) times daily. 30 g 0   fenofibrate 160 MG tablet Take 1 tablet by mouth once daily 90 tablet 3   furosemide (LASIX) 10 MG/ML solution Take 10 mg by mouth daily.     glucosamine-chondroitin 500-400 MG tablet Take 1 tablet by mouth daily.     metoprolol succinate (TOPROL XL) 25 MG 24 hr tablet Take 1 tablet (25 mg total) by mouth daily. 90 tablet 3   Multiple Vitamins-Minerals (MULTIVITAMIN WITH MINERALS) tablet Take 1 tablet by mouth daily.     mupirocin ointment (BACTROBAN) 2 % Apply topically as needed.     pramipexole (MIRAPEX) 1 MG tablet Take 1 tablet (1 mg total) by mouth in the morning, at noon, and at bedtime. 270 tablet 3   rosuvastatin (CRESTOR) 10 MG tablet Take 1 tablet (10 mg total) by mouth daily. 90 tablet 3   No current facility-administered medications on file prior to visit.    Allergies:  No Known Allergies    OBJECTIVE:  Physical Exam  Vitals:   06/26/22 1112  BP: 113/73  Pulse: 68  Weight: 243 lb (110.2 kg)  Height: 6' (1.829 m)   Body mass index is 32.96 kg/m. No results found.   General: well developed, well nourished, very pleasant elderly Caucasian male,  seated, in no evident distress HEENT: head normocephalic and atraumatic.  Moderate facial masking Cardiovascular: regular rate and rhythm, no murmurs Musculoskeletal:  no deformity Skin:  no rash/petichiae Vascular:  Normal pulses all extremities   Neurologic Exam Mental Status: Awake and fully alert.  Moderate hypophonia, no significant dysarthria.  Oriented to place and time. Recent and remote memory intact. Attention span, concentration and fund of knowledge appropriate. Mood and affect appropriate.  Cranial Nerves: Pupils equal, briskly reactive to light. Extraocular movements full without nystagmus. Visual fields full to confrontation. Hearing intact. Facial sensation intact. Face, tongue, palate moves normally and symmetrically.  Motor: Normal strength in all tested extremity muscles except mild increased tone LUE, unable to appreciate evidence of resting or action tremor. Mild cogwheel rigidity bilaterally.  Sensory.: intact to touch , pinprick , position and vibratory sensation.  Coordination: Rapid alternating movements mildly impaired L>R. Finger-to-nose and heel-to-shin performed accurately bilaterally. Gait and Station: Arises from chair without difficulty. Stance is hunched. Gait demonstrates normal stride length with use of rollator walker.  Reflexes: 1+ and symmetric. Toes downgoing.         ASSESSMENT/PLAN: RAYSON RANDO is a 76 y.o. year old male      OSA on CPAP : Compliance report shows satisfactory usage with optimal residual AHI.  Reassured we are able to see downloads, will further look into issue with his app and home downloads with [employer]. Discussed continued nightly usage with ensuring greater than 4 hours nightly for optimal benefit and per insurance purposes.  Continue to follow with DME company for any needed supplies or CPAP related concerns  Parkinson's disease:  Continue sinemet 1 tab 3 times daily Continue pramipexole 1 tab 3 times daily Referral placed to restart PT at neuro rehab Use of AD at all times for fall prevention Trial use of melatonin at night to help with insomnia, discussed good sleep hygiene  Will  discuss  other options with Dr. Rexene Bradley in regards to insomnia and daytime sleepiness  Use of miralax daily to help with constipation, once more regular, can use every other day or every few days until normal bowel movements achieved     Follow up in 6 months or call earlier if needed   CC:  PCP: Lesleigh Noe, MD    I spent 34 minutes of face-to-face and non-face-to-face time with patient and wife.  This included previsit chart review, lab review, study review, order entry, electronic health record documentation, patient and wife education regarding sleep apnea with review and discussion of compliance report, parkinsons disease and current treatment plan and possible future treatment plan and answered all other questions to patient and wife's satisfaction   Frann Rider, AGNP-BC  [facility] [address] [place], [place] [address]  Phone [phone] Fax [phone] Note: This document was prepared with digital dictation and possible smart phrase technology. Any transcriptional errors that result from this process are unintentional.

## 2022-06-23 DIAGNOSIS — H25043 Posterior subcapsular polar age-related cataract, bilateral: Secondary | ICD-10-CM | POA: Diagnosis not present

## 2022-06-23 DIAGNOSIS — H5213 Myopia, bilateral: Secondary | ICD-10-CM | POA: Diagnosis not present

## 2022-06-23 DIAGNOSIS — H43813 Vitreous degeneration, bilateral: Secondary | ICD-10-CM | POA: Diagnosis not present

## 2022-06-23 DIAGNOSIS — H2513 Age-related nuclear cataract, bilateral: Secondary | ICD-10-CM | POA: Diagnosis not present

## 2022-06-26 ENCOUNTER — Ambulatory Visit: Payer: Medicare HMO | Admitting: Adult Health

## 2022-06-26 ENCOUNTER — Encounter: Payer: Self-pay | Admitting: Adult Health

## 2022-06-26 VITALS — BP 113/73 | HR 68 | Ht 72.0 in | Wt 243.0 lb

## 2022-06-26 DIAGNOSIS — G20 Parkinson's disease: Secondary | ICD-10-CM

## 2022-06-26 DIAGNOSIS — R2681 Unsteadiness on feet: Secondary | ICD-10-CM | POA: Diagnosis not present

## 2022-06-26 DIAGNOSIS — G20A1 Parkinson's disease without dyskinesia, without mention of fluctuations: Secondary | ICD-10-CM

## 2022-06-26 DIAGNOSIS — G4733 Obstructive sleep apnea (adult) (pediatric): Secondary | ICD-10-CM

## 2022-06-26 DIAGNOSIS — Z9989 Dependence on other enabling machines and devices: Secondary | ICD-10-CM

## 2022-06-26 NOTE — Patient Instructions (Signed)
No changes today -continue current treatment plan  Referral was placed to neuro rehab PT - if you do not hear from them by next week, please call them to schedule  Will follow up regarding connection issues with your CPAP machine      Followup in the future with me in 6 months or call earlier if needed       Thank you for coming to see [place] at [facility]. I hope we have been able to provide you high quality care today.  You may receive a patient satisfaction survey over the next few weeks. We would appreciate your feedback and comments so that we may continue to improve ourselves and the health of our patients.

## 2022-06-28 ENCOUNTER — Ambulatory Visit: Payer: Medicare HMO | Admitting: Neurology

## 2022-07-05 NOTE — Progress Notes (Signed)
-----   Message -----  From: Kary Kos, CMA  Sent: 06/26/2022  12:42 PM EDT  To: Orrin Brigham Hepler  Subject: CPAP                                          Hi  I received a message from provider stating "Can we try to reach out to [employer] - patient having issues with his machine and getting downloads to app as well as different features on machine no working correctly. He has tried to contact DME multiple times but has not had any luck with this issue being resolved. Can we reach out to them to follow up on this issue? Thank you"  Can someone reach out to him and help him pease. Thank you!  DOB 16-Jun-2046       Received 07/05/22 Hepler, Alma Friendly, Verdene Lennert, CMA I have reached out to the [facility] location to give patient a call today.

## 2022-07-07 ENCOUNTER — Telehealth (INDEPENDENT_AMBULATORY_CARE_PROVIDER_SITE_OTHER): Payer: Medicare HMO | Admitting: Family Medicine

## 2022-07-07 ENCOUNTER — Encounter: Payer: Self-pay | Admitting: Family Medicine

## 2022-07-07 VITALS — Temp 99.0°F | Ht 72.0 in | Wt 240.0 lb

## 2022-07-07 DIAGNOSIS — U071 COVID-19: Secondary | ICD-10-CM | POA: Diagnosis not present

## 2022-07-07 MED ORDER — MOLNUPIRAVIR EUA 200MG CAPSULE: 4.0000 | ORAL_CAPSULE | Freq: Two times a day (BID) | ORAL | 0 refills | Status: AC

## 2022-07-07 NOTE — Assessment & Plan Note (Signed)
COVID19  Infection < 5 days from onset of symptoms in 4X vaccinated overweight individual with history of  obacco abuse, Parkinson's disease, aortic atherosclerosis and obstructive sleep apnea  No clear sign of bacterial infection at this time.   No SOB.  No red flags/need for ER visit or in-person exam at respiratory clinic at this time..    Pt higher risk for COVID complications given obacco abuse, Parkinson's disease, aortic atherosclerosis and obstructive sleep apnea . GFR 87  and but he has  medication contraindications to Paxlovid.  Start molnupiravir 5 day course. Reviewed course of medication and side effect profile with patient in detail.   Symptomatic care with mucinex and cough suppressant at night. If SOB begins symptoms worsening.. have low threshold for in-person exam, if severe shortness of breath ER visit recommended.  Can monitor Oxygen saturation at home with home monitor if able to obtain.  Go to ER if O2 sat < 90% on room air.   Reviewed home care and provided information through [facility].  Recommended quarantine 5 days isolation recommended. Return to work day 6 and wear mask for 4 more days to complete 10 days. Provided info about prevention of spread of COVID 19.

## 2022-07-07 NOTE — Patient Instructions (Signed)
Person Under Monitoring Name: [name]  Location: [address] [place] [address]   Infection Prevention Recommendations for Individuals Confirmed to have, or Being Evaluated for, 2019 Novel Coronavirus (COVID-19) Infection Who Receive Care at Home  Individuals who are confirmed to have, or are being evaluated for, COVID-19 should follow the prevention steps below until a healthcare provider or local or state health department says they can return to normal activities.  Stay home except to get medical care You should restrict activities outside your home, except for getting medical care. Do not go to work, school, or public areas, and do not use public transportation or taxis.  Call ahead before visiting your doctor Before your medical appointment, call the healthcare provider and tell them that you have, or are being evaluated for, COVID-19 infection. This will help the healthcare provider's office take steps to keep other people from getting infected. Ask your healthcare provider to call the local or state health department.  Monitor your symptoms Seek prompt medical attention if your illness is worsening (e.g., difficulty breathing). Before going to your medical appointment, call the healthcare provider and tell them that you have, or are being evaluated for, COVID-19 infection. Ask your healthcare provider to call the local or state health department.  Wear a facemask You should wear a facemask that covers your nose and mouth when you are in the same room with other people and when you visit a healthcare provider. People who live with or visit you should also wear a facemask while they are in the same room with you.  Separate yourself from other people in your home As much as possible, you should stay in a different room from other people in your home. Also, you should use a separate bathroom, if available.  Avoid sharing household items You should  not share dishes, drinking glasses, cups, eating utensils, towels, bedding, or other items with other people in your home. After using these items, you should wash them thoroughly with soap and water.  Cover your coughs and sneezes Cover your mouth and nose with a tissue when you cough or sneeze, or you can cough or sneeze into your sleeve. Throw used tissues in a lined trash can, and immediately wash your hands with soap and water for at least 20 seconds or use an alcohol-based hand rub.  Wash your [facility] your hands often and thoroughly with soap and water for at least 20 seconds. You can use an alcohol-based hand sanitizer if soap and water are not available and if your hands are not visibly dirty. Avoid touching your eyes, nose, and mouth with unwashed hands.   Prevention Steps for Caregivers and Household Members of Individuals Confirmed to have, or Being Evaluated for, COVID-19 Infection Being Cared for in the Home  If you live with, or provide care at home for, a person confirmed to have, or being evaluated for, COVID-19 infection please follow these guidelines to prevent infection:  Follow healthcare provider's instructions Make sure that you understand and can help the patient follow any healthcare provider instructions for all care.  Provide for the patient's basic needs You should help the patient with basic needs in the home and provide support for getting groceries, prescriptions, and other personal needs.  Monitor the patient's symptoms If they are getting sicker, call his or her medical provider and tell them that the patient has, or is being evaluated for, COVID-19 infection. This will help the healthcare provider's  office take steps to keep other people from getting infected. Ask the healthcare provider to call the local or state health department.  Limit the number of people who have contact with the patient If possible, have only one caregiver for the  patient. Other household members should stay in another home or place of residence. If this is not possible, they should stay in another room, or be separated from the patient as much as possible. Use a separate bathroom, if available. Restrict visitors who do not have an essential need to be in the home.  Keep older adults, very young children, and other sick people away from the patient Keep older adults, very young children, and those who have compromised immune systems or chronic health conditions away from the patient. This includes people with chronic heart, lung, or kidney conditions, diabetes, and cancer.  Ensure good ventilation Make sure that shared spaces in the home have good air flow, such as from an air conditioner or an opened window, weather permitting.  Wash your hands often Wash your hands often and thoroughly with soap and water for at least 20 seconds. You can use an alcohol based hand sanitizer if soap and water are not available and if your hands are not visibly dirty. Avoid touching your eyes, nose, and mouth with unwashed hands. Use disposable paper towels to dry your hands. If not available, use dedicated cloth towels and replace them when they become wet.  Wear a facemask and gloves Wear a disposable facemask at all times in the room and gloves when you touch or have contact with the patient's blood, body fluids, and/or secretions or excretions, such as sweat, saliva, sputum, nasal mucus, vomit, urine, or feces.  Ensure the mask fits over your nose and mouth tightly, and do not touch it during use. Throw out disposable facemasks and gloves after using them. Do not reuse. Wash your hands immediately after removing your facemask and gloves. If your personal clothing becomes contaminated, carefully remove clothing and launder. Wash your hands after handling contaminated clothing. Place all used disposable facemasks, gloves, and other waste in a lined container before  disposing them with other household waste. Remove gloves and wash your hands immediately after handling these items.  Do not share dishes, glasses, or other household items with the patient Avoid sharing household items. You should not share dishes, drinking glasses, cups, eating utensils, towels, bedding, or other items with a patient who is confirmed to have, or being evaluated for, COVID-19 infection. After the person uses these items, you should wash them thoroughly with soap and water.  Wash laundry thoroughly Immediately remove and wash clothes or bedding that have blood, body fluids, and/or secretions or excretions, such as sweat, saliva, sputum, nasal mucus, vomit, urine, or feces, on them. Wear gloves when handling laundry from the patient. Read and follow directions on labels of laundry or clothing items and detergent. In general, wash and dry with the warmest temperatures recommended on the label.  Clean all areas the individual has used often Clean all touchable surfaces, such as counters, tabletops, doorknobs, bathroom fixtures, toilets, phones, keyboards, tablets, and bedside tables, every day. Also, clean any surfaces that may have blood, body fluids, and/or secretions or excretions on them. Wear gloves when cleaning surfaces the patient has come in contact with. Use a diluted bleach solution (e.g., dilute bleach with 1 part bleach and 10 parts water) or a household disinfectant with a label that says EPA-registered for coronaviruses. To make a  bleach solution at home, add 1 tablespoon of bleach to 1 quart (4 cups) of water. For a larger supply, add  cup of bleach to 1 gallon (16 cups) of water. Read labels of cleaning products and follow recommendations provided on product labels. Labels contain instructions for safe and effective use of the cleaning product including precautions you should take when applying the product, such as wearing gloves or eye protection and making sure you  have good ventilation during use of the product. Remove gloves and wash hands immediately after cleaning.  Monitor yourself for signs and symptoms of illness Caregivers and household members are considered close contacts, should monitor their health, and will be asked to limit movement outside of the home to the extent possible. Follow the monitoring steps for close contacts listed on the symptom monitoring form.   ? If you have additional questions, contact your local health department or call the epidemiologist on call at [phone] (available 24/7). ? This guidance is subject to change. For the most up-to-date guidance from [facility], please refer to their website: [email or web address]

## 2022-07-07 NOTE — Progress Notes (Signed)
VIRTUAL VISIT A virtual visit is felt to be most appropriate for this patient at this time.   I connected with the patient on 07/07/22 at  3:40 PM EDT by virtual telehealth platform and verified that I am speaking with the correct person using two identifiers.   I discussed the limitations, risks, security and privacy concerns of performing an evaluation and management service by  virtual telehealth platform and the availability of in person appointments. I also discussed with the patient that there may be a patient responsible charge related to this service. The patient expressed understanding and agreed to proceed.  Patient location: Home Provider Location: [place] Participants: Eliezer Lofts and Delman Kitten   Chief Complaint  Patient presents with   Covid Positive    Positive home test this morning Symptoms started yesterday   Cough   Fatigue        Nasal Congestion         History of Present Illness: 76 year old male patient of Dr. Verda Cumins with history of tobacco abuse, Parkinson's disease, aortic atherosclerosis and obstructive sleep apnea presents with positive COVID test.  Date of onset: In the last 24 hours  Symptoms started with dry cough, extreme fatigue and nasal congestion.  Progressed to   worsening productive cough, temperature 99 F. Mild ST, minimal face pain.  Having body ache, significant, weakness.   Mild SOB and wheeze.   He has been using Zyrtec, benadryl.  Cough kept him up at night.    COVID 19 screen COVID testing: Positive testing today July 07, 2022 COVID vaccine: COVID-vaccine x4 COVID exposure: No recent travel or known exposure to [place]  The importance of social distancing was discussed today.    Review of Systems  Constitutional:  Positive for malaise/fatigue. Negative for chills and fever.  HENT:  Positive for congestion and sore throat. Negative for ear pain.   Eyes:  Negative for pain and redness.  Respiratory:   Positive for cough and shortness of breath.   Cardiovascular:  Negative for chest pain, palpitations and leg swelling.  Gastrointestinal:  Negative for abdominal pain, blood in stool, constipation, diarrhea, nausea and vomiting.  Genitourinary:  Negative for dysuria.  Musculoskeletal:  Positive for myalgias. Negative for falls.  Skin:  Negative for rash.  Neurological:  Negative for dizziness.  Psychiatric/Behavioral:  Negative for depression. The patient is not nervous/anxious.       Past Medical History:  Diagnosis Date   [place] coronary artery calcium score greater than 400    Coronary calcium score of 533. This was 69th percentile for age,   Aortic atherosclerosis ([place])    Hyperlipidemia LDL goal <70    Parkinson disease ([place])     reports that he has been smoking pipe. He has never used smokeless tobacco. He reports that he does not currently use alcohol after a past usage of about 7.0 standard drinks of alcohol per week. He reports that he does not use drugs.   Current Outpatient Medications:    ASPIRIN 81 PO, Take by mouth daily., Disp: , Rfl:    carbidopa-levodopa (SINEMET IR) 25-100 MG tablet, Take 1 tablet by mouth 3 (three) times daily., Disp: 270 tablet, Rfl: 3   cetirizine (ZYRTEC ALLERGY) 10 MG tablet, Take 1 tablet (10 mg total) by mouth daily., Disp: 90 tablet, Rfl: 0   Coenzyme Q10 (COQ10 PO), Take by mouth. 1 per day, Disp: , Rfl:    desonide (DESOWEN) 0.05 % cream, Apply topically 2 (two)  times daily., Disp: 30 g, Rfl: 0   fenofibrate 160 MG tablet, Take 1 tablet by mouth once daily, Disp: 90 tablet, Rfl: 3   furosemide (LASIX) 10 MG/ML solution, Take 10 mg by mouth daily., Disp: , Rfl:    glucosamine-chondroitin 500-400 MG tablet, Take 1 tablet by mouth daily., Disp: , Rfl:    metoprolol succinate (TOPROL XL) 25 MG 24 hr tablet, Take 1 tablet (25 mg total) by mouth daily., Disp: 90 tablet, Rfl: 3   Multiple Vitamins-Minerals (MULTIVITAMIN WITH MINERALS) tablet, Take  1 tablet by mouth daily., Disp: , Rfl:    mupirocin ointment (BACTROBAN) 2 %, Apply topically as needed., Disp: , Rfl:    pramipexole (MIRAPEX) 1 MG tablet, Take 1 tablet (1 mg total) by mouth in the morning, at noon, and at bedtime., Disp: 270 tablet, Rfl: 3   rosuvastatin (CRESTOR) 10 MG tablet, Take 1 tablet (10 mg total) by mouth daily., Disp: 90 tablet, Rfl: 3   Observations/Objective: Temperature 99 F (37.2 C), temperature source Oral, height 6' (1.829 m), weight 240 lb (108.9 kg).  Physical Exam  Physical Exam Constitutional:      General: The patient is not in acute distress.Speaking in complete sentences Pulmonary:     Effort: Pulmonary effort is normal. No respiratory distress.  Neurological:     Mental Status: The patient is alert and oriented to person, place, and time.  Psychiatric:        Mood and Affect: Mood normal.        Behavior: Behavior normal.   Assessment and Plan    I discussed the assessment and treatment plan with the patient. The patient was provided an opportunity to ask questions and all were answered. The patient agreed with the plan and demonstrated an understanding of the instructions.   The patient was advised to call back or seek an in-person evaluation if the symptoms worsen or if the condition fails to improve as anticipated.    Problem List Items Addressed This Visit     COVID-19 - Primary    COVID19  Infection < 5 days from onset of symptoms in 4X vaccinated overweight individual with history of  obacco abuse, Parkinson's disease, aortic atherosclerosis and obstructive sleep apnea  No clear sign of bacterial infection at this time.   No SOB.  No red flags/need for ER visit or in-person exam at respiratory clinic at this time..    Pt higher risk for COVID complications given obacco abuse, Parkinson's disease, aortic atherosclerosis and obstructive sleep apnea . GFR 87  and but he has  medication contraindications to Paxlovid.  Start  molnupiravir 5 day course. Reviewed course of medication and side effect profile with patient in detail.   Symptomatic care with mucinex and cough suppressant at night. If SOB begins symptoms worsening.. have low threshold for in-person exam, if severe shortness of breath ER visit recommended.  Can monitor Oxygen saturation at home with home monitor if able to obtain.  Go to ER if O2 sat < 90% on room air.   Reviewed home care and provided information through [facility].  Recommended quarantine 5 days isolation recommended. Return to work day 6 and wear mask for 4 more days to complete 10 days. Provided info about prevention of spread of COVID 19.       Relevant Medications   molnupiravir EUA (LAGEVRIO) 200 mg CAPS capsule       Eliezer Lofts, MD

## 2022-07-11 ENCOUNTER — Ambulatory Visit: Payer: Medicare HMO | Admitting: Physical Therapy

## 2022-07-19 ENCOUNTER — Encounter: Payer: Self-pay | Admitting: Family Medicine

## 2022-07-19 ENCOUNTER — Ambulatory Visit (INDEPENDENT_AMBULATORY_CARE_PROVIDER_SITE_OTHER): Payer: Medicare HMO | Admitting: Family Medicine

## 2022-07-19 VITALS — BP 118/76 | HR 85 | Temp 98.6°F | Ht 72.0 in | Wt 240.0 lb

## 2022-07-19 DIAGNOSIS — G20 Parkinson's disease: Secondary | ICD-10-CM | POA: Diagnosis not present

## 2022-07-19 DIAGNOSIS — Z7189 Other specified counseling: Secondary | ICD-10-CM | POA: Insufficient documentation

## 2022-07-19 NOTE — Assessment & Plan Note (Signed)
Following with neurology, appreciate support.  Continue Sinemet 25-100 mg tid.  Continue physical therapy.

## 2022-07-19 NOTE — Assessment & Plan Note (Signed)
Reviewed advanced directive questionnaire.  They will complete and return to the office.  Does not desire life-prolonging intervention if no chance for recovery.

## 2022-07-19 NOTE — Progress Notes (Signed)
   Subjective:     [name] is a 76 y.o. male presenting for Follow-up     HPI  #post covid - day 12 - cough improved - feeling better - took medication and symptoms improved - energy is back  Parkinson's disease - saw neurology and had a good visit - restarting physical therapy to work on balance and mobility - using the walker more often - will see them next week  Review of Systems   Social History   Tobacco Use  Smoking Status Some Days   Types: Pipe  Smokeless Tobacco Never  Tobacco Comments   EVERY SIX MONTHS         Objective:    BP Readings from Last 3 Encounters:  07/19/22 118/76  06/26/22 113/73  04/19/22 (!) 100/50   Wt Readings from Last 3 Encounters:  07/19/22 240 lb (108.9 kg)  07/07/22 240 lb (108.9 kg)  06/26/22 243 lb (110.2 kg)    BP 118/76   Pulse 85   Temp 98.6 F (37 C) (Oral)   Ht 6' (1.829 m)   Wt 240 lb (108.9 kg)   SpO2 97%   BMI 32.55 kg/m    Physical Exam Constitutional:      Appearance: Normal appearance. He is not ill-appearing or diaphoretic.  HENT:     Right Ear: External ear normal.     Left Ear: External ear normal.     Nose: Nose normal.  Eyes:     General: No scleral icterus.    Extraocular Movements: Extraocular movements intact.     Conjunctiva/sclera: Conjunctivae normal.  Cardiovascular:     Rate and Rhythm: Normal rate.  Pulmonary:     Effort: Pulmonary effort is normal.  Musculoskeletal:     Cervical back: Neck supple.  Skin:    General: Skin is warm and dry.  Neurological:     Mental Status: He is alert. Mental status is at baseline.  Psychiatric:        Mood and Affect: Mood normal.           Assessment & Plan:   Problem List Items Addressed This Visit       Nervous and Auditory   Parkinson's disease ([place]) - Primary    Following with neurology, appreciate support.  Continue Sinemet 25-100 mg tid.  Continue physical therapy.        Other   Counseling regarding advanced  directives and goals of care    Reviewed advanced directive questionnaire.  They will complete and return to the office.  Does not desire life-prolonging intervention if no chance for recovery.        Return in about 4 months (around 11/18/2022) for [facility] with Dr. Silvio Pate or Dr. Diona Browner.  Lesleigh Noe, MD

## 2022-07-25 ENCOUNTER — Encounter: Payer: Self-pay | Admitting: Physical Therapy

## 2022-07-25 ENCOUNTER — Ambulatory Visit: Payer: Medicare HMO | Attending: Family Medicine | Admitting: Physical Therapy

## 2022-07-25 VITALS — BP 117/68 | HR 73

## 2022-07-25 DIAGNOSIS — M6281 Muscle weakness (generalized): Secondary | ICD-10-CM | POA: Diagnosis not present

## 2022-07-25 DIAGNOSIS — R2681 Unsteadiness on feet: Secondary | ICD-10-CM | POA: Diagnosis not present

## 2022-07-25 DIAGNOSIS — G20 Parkinson's disease: Secondary | ICD-10-CM | POA: Diagnosis not present

## 2022-07-25 DIAGNOSIS — R293 Abnormal posture: Secondary | ICD-10-CM | POA: Diagnosis not present

## 2022-07-25 DIAGNOSIS — R2689 Other abnormalities of gait and mobility: Secondary | ICD-10-CM | POA: Insufficient documentation

## 2022-07-25 NOTE — Therapy (Signed)
OUTPATIENT PHYSICAL THERAPY NEURO EVALUATION   Patient Name: [name] MRN: [id number] DOB:Feb 12, 1946, 76 y.o., male Today's Date: 07/25/2022   PCP: Lesleigh Noe, MD REFERRING PROVIDER: Frann Rider, NP   PT End of Session - 07/25/22 1201     Visit Number 1    Number of Visits 13    Date for PT Re-Evaluation 09/23/22    Authorization Type [employer] - Needs Auth    PT Start Time 1100    PT Stop Time 1143    PT Time Calculation (min) 43 min    Equipment Utilized During Treatment Gait belt    Activity Tolerance Patient tolerated treatment well    Behavior During Therapy WFL for tasks assessed/performed             Past Medical History:  Diagnosis Date   Agatston coronary artery calcium score greater than 400    Coronary calcium score of 533. This was 69th percentile for age,   Aortic atherosclerosis ([place])    Hyperlipidemia LDL goal <70    Parkinson disease [facility])    Past Surgical History:  Procedure Laterality Date   TONSILLECTOMY     Patient Active Problem List   Diagnosis Date Noted   Counseling regarding advanced directives and goals of care 07/19/2022   COVID-19 07/07/2022   Agatston coronary artery calcium score greater than 400 02/02/2022   Aortic atherosclerosis ([place]) 02/01/2022   Irregular heart rate 01/12/2022   OSA (obstructive sleep apnea) 01/12/2022   Parkinson's disease ([place]) 11/25/2020   Edema of right lower leg 11/25/2020   Hyperlipidemia LDL goal <70 11/25/2020   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   Hearing loss 11/25/2020   Pityriasis rosea 11/25/2020    ONSET DATE: 06/26/2022  REFERRING DIAG: G20 (ICD-10-CM) - Parkinson's disease ([place]) R26.81 (ICD-10-CM) - Gait instability    THERAPY DIAG:  Other abnormalities of gait and mobility  Unsteadiness on feet  Abnormal posture  Muscle weakness (generalized)  Rationale for Evaluation and Treatment Rehabilitation  SUBJECTIVE:                                                                                                                                                                                               SUBJECTIVE STATEMENT: Had COVID at the end of August, feeling ok now. During the first 3 or 4 days, was pretty bad until he got the medicine. Has been doing better with his CPAP, feels like his sleep is getting somewhat better. Doesn't feel as easy on his feet, esp getting up after sitting for a long period of time. Asking about using a  using a Strong Arm comfort cane around the house (does not own one, but planning on ordering one for trial in PT). Using rollator all the time now. No falls since he was last here. Balancing on the pillows is still a challenge at home. Reports exercises are going well at home. Still riding the bike 25-30 minutes a day.   Pt accompanied by: self - family member in lobby.   PERTINENT HISTORY: PD (dx 2019), HLD, cellulitis, lymphedema, low back pain, hearing loss    PAIN:  Are you having pain? No  PRECAUTIONS: Fall  FALLS: Has patient fallen in last 6 months? No  LIVING ENVIRONMENT: Lives with: lives with their spouse Lives in: House/apartment Stairs: No, only 1 step to enter in the front.  Has following equipment at home: Gilford Rile - 2 wheeled, Environmental consultant - 4 wheeled, and Walking Stick  PLOF: Independent with community mobility with device  PATIENT GOALS Wants to improve stability, not feel like he is gonna shift, work on balance with EC, wants to be able to get up from the floor, trying a cane.   OBJECTIVE:   COGNITION: Overall cognitive status: Within functional limits for tasks assessed   SENSATION: WFL  COORDINATION: Heel to Shin: Slightly bradykinetic     POSTURE: rounded shoulders, forward head, increased thoracic kyphosis, and flexed trunk    LOWER EXTREMITY MMT:    MMT Right Eval Left Eval  Hip flexion 4+/5 4+/5  Hip extension    Hip abduction 5/5 5/5  Hip adduction 5/5 5/5  Hip internal  rotation    Hip external rotation    Knee flexion 5/5 5/5  Knee extension 5/5 5/5  Ankle dorsiflexion 5/5 5/5  Ankle plantarflexion    Ankle inversion    Ankle eversion    (Blank rows = not tested)  All tested in sitting.   BED MOBILITY:  Pt reports no difficulty getting in and out of the bed.   TRANSFERS: Assistive device utilized: None, rollator locked in front of pt  Sit to stand: SBA Stand to sit: SBA Incr forward flexed posture in standing.   GAIT: Gait pattern: step through pattern, decreased stride length, Right foot flat, Left foot flat, and trunk flexed Distance walked: Clinic distances.  Assistive device utilized: Walker - 4 wheeled Level of assistance: SBA Pt pushes rollator too far anteriorly during gait.   FUNCTIONAL TESTs:  5 times sit to stand: 15.31 seconds performed with no UE support from chair  10 meter walk test: 13.56 seconds with rollator = 2.41 ft/sec     M-CTSIB  Condition 1: Firm Surface, EO 30 Sec, Normal Sway   Condition 2: Firm Surface, EC 24 Sec, Mild sway with incr forward flexed posture   Condition 3: Foam Surface, EO 30 Sec,Mild sway with incr forward flexed posture   Condition 4: Foam Surface, EC 10  Sec, Incr postural sway (from previous D/C from PT in May was at 25 seconds)       [facility] PT Assessment - 07/25/22 1203       Standardized Balance Assessment   Standardized Balance Assessment Berg Balance Test      Berg Balance Test   Sit to Stand Able to stand without using hands and stabilize independently    Standing Unsupported Able to stand safely 2 minutes    Sitting with Back Unsupported but Feet Supported on Floor or Stool Able to sit safely and securely 2 minutes    Stand to Sit Sits safely with minimal  use of hands    Standing Unsupported with Eyes Closed Able to stand 10 seconds with supervision    Standing Unsupported with Feet Together Able to place feet together independently and stand 1 minute safely      Timed Up and  Go Test   TUG Normal TUG;Cognitive TUG    Normal TUG (seconds) 15.66   with rollator, 13.53 sconds no AD, min guard while turning   Cognitive TUG (seconds) 18.59   with rollator, counting backwards by 3s           Will finish BERG at next session.    TODAY'S TREATMENT:  N/A during eval.    PATIENT EDUCATION: Education details: Clinical findings, POC, areas to work on in therapy.  Person educated: Patient Education method: Explanation Education comprehension: verbalized understanding   HOME EXERCISE PROGRAM: [id number]- from previous bout of PT, will update and review as needed.     GOALS: Goals reviewed with patient? Yes  SHORT TERM GOALS: Target date: 08/15/2022  Pt will finish assessment of BERG with LTG written. Baseline: Goal status: INITIAL  2.  Pt will perform condition 2 of mCTSIB for at least 30 seconds in order to demo improved balance. Baseline: 24 seconds  Goal status: INITIAL  3.  Pt will improve gait speed with rollator  to at least 2.7 ft/sec in order to demo improved community mobility.   Baseline: 2.41 ft/sec Goal status: INITIAL  4.  Pt will improve ambulate at least 230' with LRAD (some type of cane) with supervision in order to demo improved household mobility.   Baseline:  Goal status: INITIAL    LONG TERM GOALS: Target date: 09/05/2022   Pt will be independent with final HEP with wife's supervision in order to build upon functional gains made in therapy.   Baseline:  Goal status: INITIAL  2.   Pt will perform floor transfers with SBA for improved balance recovery.  Baseline:  Goal status: INITIAL  3.  BERG goal to be written for improved static balance. Baseline:  Goal status: INITIAL  4.  Pt will hold condition 4 of mCTSIB for at least 20 seconds for improved vestibular input for balance Baseline: 10 seconds  Goal status: INITIAL  5.  Pt will perform TUG with LRAD vs. No AD in 13.5 seconds or less in order to demo decr fall  risk. Baseline:  Goal status: INITIAL  6.  Pt will perform cog TUG with LRAD vs. No AD in 14 seconds or less in order to demo improved dual tasking. Baseline: 18.59 seconds  Goal status: INITIAL  ASSESSMENT:  CLINICAL IMPRESSION: Patient is a 76 y.o. male who is well known to this clinic was seen today for physical therapy evaluation and treatment for PD and gait instability.  The following deficits were present during the exam: imbalance, gait abnormalities, postural impairments, decr functional strength/mobility, impaired timing and coordination, bradykinesia, impaired ability with dual tasking. Since pt was discharged in May 2023, pt has had a decline in measures of 5x sit <> stand, gait speed, TUG, and mCTSIB, indicating incr risk for falls and decr vestibular input for balance. Pt currently using a rollator at all times for balance and is inquiring about use of a cane for household distances.  Pt would benefit from skilled PT to address these impairments and functional limitations to maximize functional mobility independence   OBJECTIVE IMPAIRMENTS Abnormal gait, decreased activity tolerance, decreased balance, decreased coordination, decreased knowledge of use of DME, decreased mobility, difficulty  walking, decreased strength, impaired flexibility, and postural dysfunction.   ACTIVITY LIMITATIONS bending, squatting, stairs, transfers, and locomotion level  PARTICIPATION LIMITATIONS: community activity  PERSONAL FACTORS Age, Past/current experiences, Time since onset of injury/illness/exacerbation, and 3+ comorbidities: PD (dx 2019), HLD, cellulitis, lymphedema, low back pain, hearing loss   are also affecting patient's functional outcome.   REHAB POTENTIAL: Good  CLINICAL DECISION MAKING: Stable/uncomplicated  EVALUATION COMPLEXITY: Low  PLAN: PT FREQUENCY: 2x/week  PT DURATION: 8 weeks  PLANNED INTERVENTIONS: Therapeutic exercises, Therapeutic activity, Neuromuscular  re-education, Balance training, Gait training, Patient/Family education, Self Care, Stair training, Vestibular training, DME instructions, and Re-evaluation  PLAN FOR NEXT SESSION: finish BERG and write goal. Review and update HEP as needed. Balance with eyes closed, unlevel surfaces. Try gait with cane.    Arliss Journey, PT, DPT  07/25/2022, 12:01 PM

## 2022-07-28 ENCOUNTER — Ambulatory Visit: Payer: Medicare HMO | Admitting: Physical Therapy

## 2022-07-28 DIAGNOSIS — R293 Abnormal posture: Secondary | ICD-10-CM | POA: Diagnosis not present

## 2022-07-28 DIAGNOSIS — R2681 Unsteadiness on feet: Secondary | ICD-10-CM | POA: Diagnosis not present

## 2022-07-28 DIAGNOSIS — M6281 Muscle weakness (generalized): Secondary | ICD-10-CM | POA: Diagnosis not present

## 2022-07-28 DIAGNOSIS — R2689 Other abnormalities of gait and mobility: Secondary | ICD-10-CM

## 2022-07-28 DIAGNOSIS — G20 Parkinson's disease: Secondary | ICD-10-CM | POA: Diagnosis not present

## 2022-07-28 NOTE — Therapy (Signed)
OUTPATIENT PHYSICAL THERAPY NEURO TREATMENT   Patient Name: [name] MRN: [id number] DOB:[birth year], 76 y.o., male Today's Date: 07/28/2022   PCP: Lesleigh Noe, MD REFERRING PROVIDER: Frann Rider, NP   PT End of Session - 07/28/22 0841     Visit Number 2    Number of Visits 13    Date for PT Re-Evaluation 09/23/22    Authorization Type [employer] - Needs Auth    PT Start Time 0840    PT Stop Time 0923    PT Time Calculation (min) 43 min    Equipment Utilized During Treatment Gait belt    Activity Tolerance Patient tolerated treatment well    Behavior During Therapy WFL for tasks assessed/performed              Past Medical History:  Diagnosis Date   Agatston coronary artery calcium score greater than 400    Coronary calcium score of 533. This was 69th percentile for age,   Aortic atherosclerosis ([place])    Hyperlipidemia LDL goal <70    Parkinson disease ([place])    Past Surgical History:  Procedure Laterality Date   TONSILLECTOMY     Patient Active Problem List   Diagnosis Date Noted   Counseling regarding advanced directives and goals of care 07/19/2022   COVID-19 07/07/2022   Agatston coronary artery calcium score greater than 400 02/02/2022   Aortic atherosclerosis ([place]) 02/01/2022   Irregular heart rate 01/12/2022   OSA (obstructive sleep apnea) 01/12/2022   Parkinson's disease ([place]) 11/25/2020   Edema of right lower leg 11/25/2020   Hyperlipidemia LDL goal <70 11/25/2020   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   Hearing loss 11/25/2020   Pityriasis rosea 11/25/2020    ONSET DATE: 06/26/2022  REFERRING DIAG: G20 (ICD-10-CM) - Parkinson's disease ([place]) R26.81 (ICD-10-CM) - Gait instability    THERAPY DIAG:  Unsteadiness on feet  Other abnormalities of gait and mobility  Abnormal posture  Rationale for Evaluation and Treatment Rehabilitation  SUBJECTIVE:                                                                                                                                                                                               SUBJECTIVE STATEMENT: Pt reports if he sits for too long, his RLE goes numb and he has had to sat a lot when since he had Covid. Just now getting back into riding his bike. Still having difficulty getting on/off floor. No new changes   Pt accompanied by: self - family member in lobby.   PERTINENT HISTORY: PD (dx 2019), HLD, cellulitis, lymphedema, low back  pain, hearing loss    PAIN:  Are you having pain? Yes: NPRS scale: 1/10 Pain location: R wrist Pain description: Achy/sore  PRECAUTIONS: Fall  FALLS: Has patient fallen in last 6 months? No  PLOF: Independent with community mobility with device  PATIENT GOALS Wants to improve stability, not feel like he is gonna shift, work on balance with EC, wants to be able to get up from the floor, trying a cane.   OBJECTIVE:   POSTURE: rounded shoulders, forward head, increased thoracic kyphosis, and flexed trunk    TODAY'S TREATMENT:  Therapeutic Activity    [facility] PT Assessment - 07/28/22 0847       Berg Balance Test   Sit to Stand Able to stand without using hands and stabilize independently    Standing Unsupported Able to stand safely 2 minutes    Sitting with Back Unsupported but Feet Supported on Floor or Stool Able to sit safely and securely 2 minutes    Stand to Sit Sits safely with minimal use of hands    Transfers Able to transfer safely, minor use of hands    Standing Unsupported with Eyes Closed Able to stand 10 seconds with supervision    Standing Unsupported with Feet Together Able to place feet together independently and stand 1 minute safely    From Standing, Reach Forward with Outstretched Arm Can reach forward >12 cm safely (5")    From Standing Position, Pick up Object from Floor Able to pick up shoe, needs supervision    From Standing Position, Turn to Look Behind Over each Shoulder Looks behind one side  only/other side shows less weight shift   R>L   Turn 360 Degrees Able to turn 360 degrees safely but slowly   4.97s to R   Standing Unsupported, Alternately Place Feet on Step/Stool Able to complete >2 steps/needs minimal assist    Standing Unsupported, One Foot in Front Able to take small step independently and hold 30 seconds    Standing on One Leg Tries to lift leg/unable to hold 3 seconds but remains standing independently    Total Score 42    Berg comment: Significant fall risk            NMR  Added to HEP from previous bout of therapy for improved vestibular input w/balance and narrow BOS:  - Fwd and retro heel to toe walking in // bars, down and back x2. Pt unable to perform without BUE support. Noted increased reliance on hip strategy for balance correction throughout  - Vertical Head Nods on Airex w/o UE support, 2x60s. Noted posterior sway w/cervical extension and anterior sway w/cervical flexion. Increased difficulty w/cervical flexion > extension throughout.   In // bars for improved single leg stability and ankle strategy: -Standing marches on airex w/BUE support. Min cues to slow down and hold position for 1-2s, but pt unable to perform. Noted increased difficulty standing on RLE > LLE   -Static hold standing on one leg on Airex w/BUE, 2x20-30s per side. Noted adduction of propped leg on stance leg and lateral lean to L side throughout. Increased A/P sway when standing on RLE.   GAIT: Gait pattern: step through pattern, decreased stride length, Right foot flat, Left foot flat, and trunk flexed Distance walked: Clinic distances.  Assistive device utilized: Walker - 4 wheeled Level of assistance: SBA Pt pushes rollator too far anteriorly during gait despite cues.    PATIENT EDUCATION: Education details: Updates to HEP, Berg outcome  Person  educated: Patient Education method: Explanation Education comprehension: verbalized understanding   HOME EXERCISE  PROGRAM: [id number]    GOALS: Goals reviewed with patient? Yes  SHORT TERM GOALS: Target date: 08/15/2022  Pt will finish assessment of BERG with LTG written. Baseline: Goal status: MET  2.  Pt will perform condition 2 of mCTSIB for at least 30 seconds in order to demo improved balance. Baseline: 24 seconds  Goal status: INITIAL  3.  Pt will improve gait speed with rollator  to at least 2.7 ft/sec in order to demo improved community mobility.   Baseline: 2.41 ft/sec Goal status: INITIAL  4.  Pt will improve ambulate at least 230' with LRAD (some type of cane) with supervision in order to demo improved household mobility.   Baseline:  Goal status: INITIAL    LONG TERM GOALS: Target date: 09/05/2022   Pt will be independent with final HEP with wife's supervision in order to build upon functional gains made in therapy.   Baseline:  Goal status: INITIAL  2.   Pt will perform floor transfers with SBA for improved balance recovery.  Baseline:  Goal status: INITIAL  3.  Pt will improve Berg score to 48/56 for decreased fall risk  Baseline: 42/56 Goal status: REVISED  4.  Pt will hold condition 4 of mCTSIB for at least 20 seconds for improved vestibular input for balance Baseline: 10 seconds  Goal status: INITIAL  5.  Pt will perform TUG with LRAD vs. No AD in 13.5 seconds or less in order to demo decr fall risk. Baseline:  Goal status: INITIAL  6.  Pt will perform cog TUG with LRAD vs. No AD in 14 seconds or less in order to demo improved dual tasking. Baseline: 18.59 seconds  Goal status: INITIAL  ASSESSMENT:  CLINICAL IMPRESSION: Emphasis of skilled PT session on finishing balance assessment and updating HEP from prior bout of therapy to integrate more standing balance and vestibular input. Pt scored a 42/56, indicative of significant fall risk. Pt demonstrated the greatest difficulty with single leg stability tasks, turns and standing w/EC. Added exercises to HEP  for balance and encouraged pt to build up his tolerance to exercise slowly following tough bout w/Covid in August. Continue POC.    OBJECTIVE IMPAIRMENTS Abnormal gait, decreased activity tolerance, decreased balance, decreased coordination, decreased knowledge of use of DME, decreased mobility, difficulty walking, decreased strength, impaired flexibility, and postural dysfunction.   ACTIVITY LIMITATIONS bending, squatting, stairs, transfers, and locomotion level  PARTICIPATION LIMITATIONS: community activity  PERSONAL FACTORS Age, Past/current experiences, Time since onset of injury/illness/exacerbation, and 3+ comorbidities: PD (dx 2019), HLD, cellulitis, lymphedema, low back pain, hearing loss   are also affecting patient's functional outcome.   REHAB POTENTIAL: Good  CLINICAL DECISION MAKING: Stable/uncomplicated  EVALUATION COMPLEXITY: Low  PLAN: PT FREQUENCY: 2x/week  PT DURATION: 8 weeks  PLANNED INTERVENTIONS: Therapeutic exercises, Therapeutic activity, Neuromuscular re-education, Balance training, Gait training, Patient/Family education, Self Care, Stair training, Vestibular training, DME instructions, and Re-evaluation  PLAN FOR NEXT SESSION: Review and update HEP as needed. Balance with eyes closed, unlevel surfaces. Try gait with cane (he will get it the 20th). Postural control    Cruzita Lederer Emery Dupuy, PT, DPT  07/28/2022, 9:32 AM

## 2022-08-02 ENCOUNTER — Ambulatory Visit: Payer: Medicare HMO | Admitting: Physical Therapy

## 2022-08-02 DIAGNOSIS — R293 Abnormal posture: Secondary | ICD-10-CM

## 2022-08-02 DIAGNOSIS — G20 Parkinson's disease: Secondary | ICD-10-CM | POA: Diagnosis not present

## 2022-08-02 DIAGNOSIS — R2681 Unsteadiness on feet: Secondary | ICD-10-CM

## 2022-08-02 DIAGNOSIS — R2689 Other abnormalities of gait and mobility: Secondary | ICD-10-CM | POA: Diagnosis not present

## 2022-08-02 DIAGNOSIS — M6281 Muscle weakness (generalized): Secondary | ICD-10-CM | POA: Diagnosis not present

## 2022-08-02 NOTE — Therapy (Signed)
OUTPATIENT PHYSICAL THERAPY NEURO TREATMENT   Patient Name: [name] MRN: [id number] DOB:November 06, 1946, 76 y.o., male Today's Date: 08/02/2022   PCP: Lesleigh Noe, MD REFERRING PROVIDER: Frann Rider, NP   PT End of Session - 08/02/22 0933     Visit Number 3    Number of Visits 13    Date for PT Re-Evaluation 09/23/22    Authorization Type [employer] - Needs Auth    PT Start Time [phone]    PT Stop Time 1015    PT Time Calculation (min) 44 min    Equipment Utilized During Treatment Gait belt    Activity Tolerance Patient tolerated treatment well    Behavior During Therapy WFL for tasks assessed/performed               Past Medical History:  Diagnosis Date   Agatston coronary artery calcium score greater than 400    Coronary calcium score of 533. This was 69th percentile for age,   Aortic atherosclerosis ([place])    Hyperlipidemia LDL goal <70    Parkinson disease [facility])    Past Surgical History:  Procedure Laterality Date   TONSILLECTOMY     Patient Active Problem List   Diagnosis Date Noted   Counseling regarding advanced directives and goals of care 07/19/2022   COVID-19 07/07/2022   Agatston coronary artery calcium score greater than 400 02/02/2022   Aortic atherosclerosis ([place]) 02/01/2022   Irregular heart rate 01/12/2022   OSA (obstructive sleep apnea) 01/12/2022   Parkinson's disease ([place]) 11/25/2020   Edema of right lower leg 11/25/2020   Hyperlipidemia LDL goal <70 11/25/2020   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   Hearing loss 11/25/2020   Pityriasis rosea 11/25/2020    ONSET DATE: 06/26/2022  REFERRING DIAG: G20 (ICD-10-CM) - Parkinson's disease ([place]) R26.81 (ICD-10-CM) - Gait instability    THERAPY DIAG:  Unsteadiness on feet  Other abnormalities of gait and mobility  Abnormal posture  Rationale for Evaluation and Treatment Rehabilitation  SUBJECTIVE:                                                                                                                                                                                               SUBJECTIVE STATEMENT: Pt reports his R wrist is still bothering him. Brought his new StrongArm cane to clinic today. No new changes, HEP is going well but it is difficult for him to turn his head.   Pt accompanied by: self - family member in lobby.   PERTINENT HISTORY: PD (dx 2019), HLD, cellulitis, lymphedema, low back pain, hearing loss    PAIN:  Are you having pain? Yes: NPRS scale: 1/10 Pain location: R wrist Pain description: Achy/sore  PRECAUTIONS: Fall  FALLS: Has patient fallen in last 6 months? No  PLOF: Independent with community mobility with device  PATIENT GOALS Wants to improve stability, not feel like he is gonna shift, work on balance with EC, wants to be able to get up from the floor, trying a cane.   OBJECTIVE:   POSTURE: rounded shoulders, forward head, increased thoracic kyphosis, and flexed trunk    TODAY'S TREATMENT:  Gait Training  -Sit <>stands w/strong arm cane, x6 reps to practice proper positioning of cane and hand placement. Adjusted cane to proper height and provided cues to pull cane towards body as pt stands, as he was pushing cane away and causing cane to tip fwd. With cues, pt able to perform and keep base of cane on ground.   Gait pattern:  R shoulder shrug, step through pattern, decreased arm swing- Left, decreased stride length, decreased hip/knee flexion- Right, decreased hip/knee flexion- Left, scissoring, lateral lean- Right, trunk flexed, narrow BOS, and poor foot clearance- Right Distance walked: 115' x2 Assistive device utilized:  StrongArm cane on R side Level of assistance: SBA Comments: Noted minor R truncal lean and shoulder shrug w/use of cane as well as scissoring of RLE throughout. No instability noted and pt able to sequence cane well. Short seated rest break taken in between gait trials due to fatigue   Gait pattern:  step through pattern, decreased step length- Right, decreased step length- Left, decreased hip/knee flexion- Right, decreased hip/knee flexion- Left, scissoring, trunk flexed, and narrow BOS Distance walked: 115' holding 15# Bosu Surge  Assistive device utilized: None Level of assistance: CGA and Min A Comments: Pt required frequent standing rest breaks during lap due to poor posterior chain strength and postural control. Pt frequently losing balance anterolaterally to R side, requiring min A to recover. Pt extremely fatigued following activity    Ther Ex   -Standing shoulder shrugs w/Bosu surge (15#) for improved upper trap strength and postural control. Min cues to maintain extended elbows bilaterally to minimize compensation strategies.  -Standing chin tucks against wall for improved posture and cervical ROM, but pt unable to perform chin tuck against wall and performed cervical extension instead. Regressed to supine chin tucks w/towel roll placed on occiput, x15 reps w/2-3s isometric hold    Added to HEP for improved cervical ROM and upper trap stretch: - Supine Cervical Retraction with Towel  - 1 x daily - 7 x weekly - 3 sets - 10 reps - Seated Cervical Sidebending Stretch  - 1 x daily - 7 x weekly - 1 sets - 4 reps - 30 second hold    PATIENT EDUCATION: Education details: Updates to HEP, proper cane management, performing static stretching after exercising  Person educated: Patient Education method: Explanation Education comprehension: verbalized understanding   HOME EXERCISE PROGRAM: [id number]    GOALS: Goals reviewed with patient? Yes  SHORT TERM GOALS: Target date: 08/15/2022  Pt will finish assessment of BERG with LTG written. Baseline: Goal status: MET  2.  Pt will perform condition 2 of mCTSIB for at least 30 seconds in order to demo improved balance. Baseline: 24 seconds  Goal status: INITIAL  3.  Pt will improve gait speed with rollator  to at least 2.7 ft/sec  in order to demo improved community mobility.   Baseline: 2.41 ft/sec Goal status: INITIAL  4.  Pt will improve ambulate at least 230' with  LRAD (some type of cane) with supervision in order to demo improved household mobility.   Baseline:  Goal status: INITIAL    LONG TERM GOALS: Target date: 09/05/2022   Pt will be independent with final HEP with wife's supervision in order to build upon functional gains made in therapy.   Baseline:  Goal status: INITIAL  2.   Pt will perform floor transfers with SBA for improved balance recovery.  Baseline:  Goal status: INITIAL  3.  Pt will improve Berg score to 48/56 for decreased fall risk  Baseline: 42/56 Goal status: REVISED  4.  Pt will hold condition 4 of mCTSIB for at least 20 seconds for improved vestibular input for balance Baseline: 10 seconds  Goal status: INITIAL  5.  Pt will perform TUG with LRAD vs. No AD in 13.5 seconds or less in order to demo decr fall risk. Baseline:  Goal status: INITIAL  6.  Pt will perform cog TUG with LRAD vs. No AD in 14 seconds or less in order to demo improved dual tasking. Baseline: 18.59 seconds  Goal status: INITIAL  ASSESSMENT:  CLINICAL IMPRESSION: Emphasis of skilled PT session on gait training w/Strong Arm cane, postural control and cervical ROM. Pt able to sequence new cane well but noted poor postural control and significant shoulder shrug on R side. Pt reports he cannot perform his HEP w/head turns well due to stiffness in his neck. Added cervical stretching/ROM to pt's HEP but noted significant tightness in R scalenes/upper trap. Noted during gait training that pt is scissoring RLE more profoundly compared to previous bout of therapy. Continue POC.    OBJECTIVE IMPAIRMENTS Abnormal gait, decreased activity tolerance, decreased balance, decreased coordination, decreased knowledge of use of DME, decreased mobility, difficulty walking, decreased strength, impaired flexibility, and  postural dysfunction.   ACTIVITY LIMITATIONS bending, squatting, stairs, transfers, and locomotion level  PARTICIPATION LIMITATIONS: community activity  PERSONAL FACTORS Age, Past/current experiences, Time since onset of injury/illness/exacerbation, and 3+ comorbidities: PD (dx 2019), HLD, cellulitis, lymphedema, low back pain, hearing loss   are also affecting patient's functional outcome.   REHAB POTENTIAL: Good  CLINICAL DECISION MAKING: Stable/uncomplicated  EVALUATION COMPLEXITY: Low  PLAN: PT FREQUENCY: 2x/week  PT DURATION: 8 weeks  PLANNED INTERVENTIONS: Therapeutic exercises, Therapeutic activity, Neuromuscular re-education, Balance training, Gait training, Patient/Family education, Self Care, Stair training, Vestibular training, DME instructions, and Re-evaluation  PLAN FOR NEXT SESSION: Review and update HEP as needed. Balance with eyes closed, unlevel surfaces. Continue practicing w/cane if he brings it (obstacles, turns, outside). Postural control and cervical ROM. Stepping strategies. Noted pt is scissoring his RLE more w/gait now    Cruzita Lederer Daily Doe, PT, DPT  08/02/2022, 11:58 AM

## 2022-08-04 ENCOUNTER — Ambulatory Visit: Payer: Medicare HMO | Admitting: Physical Therapy

## 2022-08-04 ENCOUNTER — Encounter: Payer: Self-pay | Admitting: Physical Therapy

## 2022-08-04 DIAGNOSIS — M6281 Muscle weakness (generalized): Secondary | ICD-10-CM | POA: Diagnosis not present

## 2022-08-04 DIAGNOSIS — R2681 Unsteadiness on feet: Secondary | ICD-10-CM

## 2022-08-04 DIAGNOSIS — R293 Abnormal posture: Secondary | ICD-10-CM

## 2022-08-04 DIAGNOSIS — G20 Parkinson's disease: Secondary | ICD-10-CM | POA: Diagnosis not present

## 2022-08-04 DIAGNOSIS — R2689 Other abnormalities of gait and mobility: Secondary | ICD-10-CM | POA: Diagnosis not present

## 2022-08-04 NOTE — Therapy (Signed)
OUTPATIENT PHYSICAL THERAPY NEURO TREATMENT   Patient Name: [name] MRN: [id number] DOB:1945/12/07, 76 y.o., male Today's Date: 08/04/2022   PCP: Lesleigh Noe, MD REFERRING PROVIDER: Frann Rider, NP   PT End of Session - 08/04/22 0936     Visit Number 4    Number of Visits 13    Date for PT Re-Evaluation 09/23/22    Authorization Type [employer] - 12 visits approved 07/28/22 - 09/01/22    PT Start Time 0933    PT Stop Time 1014    PT Time Calculation (min) 41 min    Equipment Utilized During Treatment Gait belt    Activity Tolerance Patient tolerated treatment well    Behavior During Therapy WFL for tasks assessed/performed               Past Medical History:  Diagnosis Date   Agatston coronary artery calcium score greater than 400    Coronary calcium score of 533. This was 69th percentile for age,   Aortic atherosclerosis ([place])    Hyperlipidemia LDL goal <70    Parkinson disease [facility])    Past Surgical History:  Procedure Laterality Date   TONSILLECTOMY     Patient Active Problem List   Diagnosis Date Noted   Counseling regarding advanced directives and goals of care 07/19/2022   COVID-19 07/07/2022   Agatston coronary artery calcium score greater than 400 02/02/2022   Aortic atherosclerosis ([place]) 02/01/2022   Irregular heart rate 01/12/2022   OSA (obstructive sleep apnea) 01/12/2022   Parkinson's disease ([place]) 11/25/2020   Edema of right lower leg 11/25/2020   Hyperlipidemia LDL goal <70 11/25/2020   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   Hearing loss 11/25/2020   Pityriasis rosea 11/25/2020    ONSET DATE: 06/26/2022  REFERRING DIAG: G20 (ICD-10-CM) - Parkinson's disease ([place]) R26.81 (ICD-10-CM) - Gait instability    THERAPY DIAG:  Unsteadiness on feet  Other abnormalities of gait and mobility  Abnormal posture  Rationale for Evaluation and Treatment Rehabilitation  SUBJECTIVE:                                                                                                                                                                                               SUBJECTIVE STATEMENT: Didn't bring in his StrongArm cane from home. Has tried using it at home for small distances and reports it went well. No fall.   Pt accompanied by: self - family member in lobby.   PERTINENT HISTORY: PD (dx 2019), HLD, cellulitis, lymphedema, low back pain, hearing loss    PAIN:  Are you having pain? No  PRECAUTIONS: Fall   PATIENT GOALS Wants to improve stability, not feel like he is gonna shift, work on balance with EC, wants to be able to get up from the floor, trying a cane.   OBJECTIVE:   POSTURE: rounded shoulders, forward head, increased thoracic kyphosis, and flexed trunk    TODAY'S TREATMENT:   NMR:  On air ex: -With feet apart: EO 10 reps head turns, 10 reps head nods. EC 3 x 30 seconds. Verbal/tactile cues for posture throughout as pt with incr forward lean, esp with EC tasks.  -With wide BOS, trunk rotations reaching across body and grabbing bean bag outside of BOS and then re-setting with tall posture in midline before tossing into crate, x15 reps each side -Wall bumps into // bars for weight shifting posteriorly and then shifting weight anteriorly for glute activation, cues for bringing hips forward x10 reps  -Alternating lateral stepping strategy x8 reps each leg, incr difficulty with RLE stepping back onto foam, performed without UE support, min guard for balance. Pt with incr forward flexed posture.   On rockerboard: -Weight shifting in A/P direction x15 reps, use of mirror as visual cue for posture, cued for looking straight ahead, pt needing to mainly use fingertip support for balance.  -Keeping board steady, alternating UE lifts over head, x10 reps each side, min guard for balance.   At edge of mat table: 10 reps sit <> stands with black tband around pt's pelvis and 2nd PT providing resistance  posteriorly for pt to perform incr anterior weight shift and hip extension into band in standing, performed with no UE support.    Gait pattern:   , step through pattern, decreased arm swing- Left, decreased stride length, decreased hip/knee flexion- Right, decreased hip/knee flexion- Left, scissoring, lateral lean- Right, trunk flexed, narrow BOS, and poor foot clearance- Right Distance walked: Into and out of clinic.  Assistive device utilized:  Rollator Level of assistance: SBA Comments: Cued to stay closer to rollator and for posture as pt with tendency to push it too far anteriorly. Cued to also keep rollator close by when turning and going to sit down on the mat table.     PATIENT EDUCATION: Education details: Continue HEP, bringing cane in to next session to continue to practice with it (such as obstacles, turns etc).  Person educated: Patient Education method: Explanation Education comprehension: verbalized understanding   HOME EXERCISE PROGRAM: [id number]    GOALS: Goals reviewed with patient? Yes  SHORT TERM GOALS: Target date: 08/15/2022  Pt will finish assessment of BERG with LTG written. Baseline: Goal status: MET  2.  Pt will perform condition 2 of mCTSIB for at least 30 seconds in order to demo improved balance. Baseline: 24 seconds  Goal status: INITIAL  3.  Pt will improve gait speed with rollator  to at least 2.7 ft/sec in order to demo improved community mobility.   Baseline: 2.41 ft/sec Goal status: INITIAL  4.  Pt will improve ambulate at least 230' with LRAD (some type of cane) with supervision in order to demo improved household mobility.   Baseline:  Goal status: INITIAL    LONG TERM GOALS: Target date: 09/05/2022   Pt will be independent with final HEP with wife's supervision in order to build upon functional gains made in therapy.   Baseline:  Goal status: INITIAL  2.   Pt will perform floor transfers with SBA for improved balance  recovery.  Baseline:  Goal status: INITIAL  3.  Pt  will improve Berg score to 48/56 for decreased fall risk  Baseline: 42/56 Goal status: REVISED  4.  Pt will hold condition 4 of mCTSIB for at least 20 seconds for improved vestibular input for balance Baseline: 10 seconds  Goal status: INITIAL  5.  Pt will perform TUG with LRAD vs. No AD in 13.5 seconds or less in order to demo decr fall risk. Baseline:  Goal status: INITIAL  6.  Pt will perform cog TUG with LRAD vs. No AD in 14 seconds or less in order to demo improved dual tasking. Baseline: 18.59 seconds  Goal status: INITIAL  ASSESSMENT:  CLINICAL IMPRESSION: Today's skilled session focused on balance strategies on unlevel surfaces and focusing on posture with balance/sit <> stands. Pt challenged by balance tasks with head motions and EC on foam and pt with tendency to lean too far anteriorly esp with EC tasks. Pt more challenged with foot clearance with RLE during stepping tasks for balance. Will continue to progress towards LTGs.     OBJECTIVE IMPAIRMENTS Abnormal gait, decreased activity tolerance, decreased balance, decreased coordination, decreased knowledge of use of DME, decreased mobility, difficulty walking, decreased strength, impaired flexibility, and postural dysfunction.   ACTIVITY LIMITATIONS bending, squatting, stairs, transfers, and locomotion level  PARTICIPATION LIMITATIONS: community activity  PERSONAL FACTORS Age, Past/current experiences, Time since onset of injury/illness/exacerbation, and 3+ comorbidities: PD (dx 2019), HLD, cellulitis, lymphedema, low back pain, hearing loss   are also affecting patient's functional outcome.   REHAB POTENTIAL: Good  CLINICAL DECISION MAKING: Stable/uncomplicated  EVALUATION COMPLEXITY: Low  PLAN: PT FREQUENCY: 2x/week  PT DURATION: 8 weeks  PLANNED INTERVENTIONS: Therapeutic exercises, Therapeutic activity, Neuromuscular re-education, Balance training, Gait  training, Patient/Family education, Self Care, Stair training, Vestibular training, DME instructions, and Re-evaluation  PLAN FOR NEXT SESSION: Review and update HEP as needed. Balance with eyes closed, unlevel surfaces. Continue practicing w/cane if he brings it (obstacles, turns, outside). Postural control and cervical ROM. Stepping strategies. Noted pt is scissoring his RLE more w/gait now    Arliss Journey, PT, DPT  08/04/2022, 11:39 AM

## 2022-08-07 ENCOUNTER — Ambulatory Visit: Payer: Medicare HMO | Admitting: Physical Therapy

## 2022-08-07 DIAGNOSIS — D2271 Melanocytic nevi of right lower limb, including hip: Secondary | ICD-10-CM | POA: Diagnosis not present

## 2022-08-07 DIAGNOSIS — R2681 Unsteadiness on feet: Secondary | ICD-10-CM

## 2022-08-07 DIAGNOSIS — L218 Other seborrheic dermatitis: Secondary | ICD-10-CM | POA: Diagnosis not present

## 2022-08-07 DIAGNOSIS — R293 Abnormal posture: Secondary | ICD-10-CM | POA: Diagnosis not present

## 2022-08-07 DIAGNOSIS — L304 Erythema intertrigo: Secondary | ICD-10-CM | POA: Diagnosis not present

## 2022-08-07 DIAGNOSIS — M6281 Muscle weakness (generalized): Secondary | ICD-10-CM | POA: Diagnosis not present

## 2022-08-07 DIAGNOSIS — D2261 Melanocytic nevi of right upper limb, including shoulder: Secondary | ICD-10-CM | POA: Diagnosis not present

## 2022-08-07 DIAGNOSIS — G20 Parkinson's disease: Secondary | ICD-10-CM | POA: Diagnosis not present

## 2022-08-07 DIAGNOSIS — R2689 Other abnormalities of gait and mobility: Secondary | ICD-10-CM | POA: Diagnosis not present

## 2022-08-07 DIAGNOSIS — D2262 Melanocytic nevi of left upper limb, including shoulder: Secondary | ICD-10-CM | POA: Diagnosis not present

## 2022-08-07 DIAGNOSIS — D2272 Melanocytic nevi of left lower limb, including hip: Secondary | ICD-10-CM | POA: Diagnosis not present

## 2022-08-07 NOTE — Therapy (Signed)
OUTPATIENT PHYSICAL THERAPY NEURO TREATMENT   Patient Name: [name] MRN: [id number] DOB:Aug 14, 1946, 76 y.o., male Today's Date: 08/07/2022   PCP: Lesleigh Noe, MD REFERRING PROVIDER: Frann Rider, NP   PT End of Session - 08/07/22 0926     Visit Number 5    Number of Visits 13    Date for PT Re-Evaluation 09/23/22    Authorization Type [employer] - 12 visits approved 07/28/22 - 09/01/22    PT Start Time 0923    PT Stop Time 1006    PT Time Calculation (min) 43 min    Equipment Utilized During Treatment Gait belt    Activity Tolerance Patient tolerated treatment well    Behavior During Therapy WFL for tasks assessed/performed                Past Medical History:  Diagnosis Date   Agatston coronary artery calcium score greater than 400    Coronary calcium score of 533. This was 69th percentile for age,   Aortic atherosclerosis ([place])    Hyperlipidemia LDL goal <70    Parkinson disease [facility])    Past Surgical History:  Procedure Laterality Date   TONSILLECTOMY     Patient Active Problem List   Diagnosis Date Noted   Counseling regarding advanced directives and goals of care 07/19/2022   COVID-19 07/07/2022   Agatston coronary artery calcium score greater than 400 02/02/2022   Aortic atherosclerosis ([place]) 02/01/2022   Irregular heart rate 01/12/2022   OSA (obstructive sleep apnea) 01/12/2022   Parkinson's disease ([place]) 11/25/2020   Edema of right lower leg 11/25/2020   Hyperlipidemia LDL goal <70 11/25/2020   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   Hearing loss 11/25/2020   Pityriasis rosea 11/25/2020    ONSET DATE: 06/26/2022  REFERRING DIAG: G20 (ICD-10-CM) - Parkinson's disease ([place]) R26.81 (ICD-10-CM) - Gait instability    THERAPY DIAG:  Unsteadiness on feet  Other abnormalities of gait and mobility  Abnormal posture  Rationale for Evaluation and Treatment Rehabilitation  SUBJECTIVE:                                                                                                                                                                                               SUBJECTIVE STATEMENT: Pt brought in his StrongArm cane today. No new falls, HEP is going well but EC is still challenging.   Pt accompanied by: self - family member in lobby.   PERTINENT HISTORY: PD (dx 2019), HLD, cellulitis, lymphedema, low back pain, hearing loss    PAIN:  Are you having pain? No  PRECAUTIONS: Fall  PATIENT GOALS Wants to improve stability, not feel like he is gonna shift, work on balance with EC, wants to be able to get up from the floor, trying a cane.   OBJECTIVE:   POSTURE: rounded shoulders, forward head, increased thoracic kyphosis, and flexed trunk    TODAY'S TREATMENT:  NMR: In // bars for improved posterior chain strength, dynamic standing balance and postural control: -12# KB deadlifts to 6" step target, x12 reps. Min cues for neutral spine and posterior weight shift throughout.  -Staggered stance 12# KB RDLs to 6" step target, x10 per side. Pt demonstrated good eccentric control while lowering to step but had significant difficulty w/concentric contraction. Noted increased difficulty w/RLE forward compared to LLE. Min A throughout for safety  -On rockerboard in A/P direction, standing w/EO and progressing to [facility] without UE support. Pt able to hold for >30s w/EO, but w/EC, pt only able to hold for 2-4s prior to losing balance posteriorly. Educated pt on keeping weight through entirety of foot, as pt tends to keep weight in heels and maintain balance by flexing trunk forward. As educated pt on ankle strategy and avoiding fighting the board. W/cues for equal weightbearing through foot, pt able to hold w/EC for 8-10s and began losing balance anteriorly. CGA throughout for safety   -Sit <>stands holding 15# Bosu Surge for improved immediate standing balance, midline orientation and BLE strength. Pt performed x10 reps  with no signs of retropulsion or instability. Progressed to standing on Airex, x7 reps. Pt w/increased difficulty on foam, requiring multiple attempts to stand initially. No signs of instability noted while standing, but pt did assume forward flexed posture.    Gait pattern:   , step through pattern, decreased arm swing- Left, decreased stride length, decreased hip/knee flexion- Right, decreased hip/knee flexion- Left, scissoring, lateral lean- Right, trunk flexed, narrow BOS, and poor foot clearance- Right Distance walked: Into and out of clinic.  Assistive device utilized:  Rollator Level of assistance: SBA Comments: Cued to stay closer to rollator and for posture as pt with tendency to push it too far anteriorly. Cued to also keep rollator close by when turning and going to sit down on the mat table.     PATIENT EDUCATION: Education details: Continue HEP, bringing cane in to next session to continue to practice with it (such as obstacles, turns etc).  Person educated: Patient Education method: Explanation Education comprehension: verbalized understanding   HOME EXERCISE PROGRAM: [id number]    GOALS: Goals reviewed with patient? Yes  SHORT TERM GOALS: Target date: 08/15/2022  Pt will finish assessment of BERG with LTG written. Baseline: Goal status: MET  2.  Pt will perform condition 2 of mCTSIB for at least 30 seconds in order to demo improved balance. Baseline: 24 seconds  Goal status: INITIAL  3.  Pt will improve gait speed with rollator  to at least 2.7 ft/sec in order to demo improved community mobility.   Baseline: 2.41 ft/sec Goal status: INITIAL  4.  Pt will improve ambulate at least 230' with LRAD (some type of cane) with supervision in order to demo improved household mobility.   Baseline:  Goal status: INITIAL    LONG TERM GOALS: Target date: 09/05/2022   Pt will be independent with final HEP with wife's supervision in order to build upon functional gains  made in therapy.   Baseline:  Goal status: INITIAL  2.   Pt will perform floor transfers with SBA for improved balance recovery.  Baseline:  Goal  status: INITIAL  3.  Pt will improve Berg score to 48/56 for decreased fall risk  Baseline: 42/56 Goal status: REVISED  4.  Pt will hold condition 4 of mCTSIB for at least 20 seconds for improved vestibular input for balance Baseline: 10 seconds  Goal status: INITIAL  5.  Pt will perform TUG with LRAD vs. No AD in 13.5 seconds or less in order to demo decr fall risk. Baseline:  Goal status: INITIAL  6.  Pt will perform cog TUG with LRAD vs. No AD in 14 seconds or less in order to demo improved dual tasking. Baseline: 18.59 seconds  Goal status: INITIAL  ASSESSMENT:  CLINICAL IMPRESSION: Emphasis of skilled PT session on posterior chain strength, postural control and dynamic standing balance. Pt demonstrates significant difficulty w/vestibular input and weight shifting to RLE. Encouraged pt to continue to practice utilizing ankle strategy at home, especially w/EC, as pt tends to rely heavily on hip strategy, resulting in over-correction of balance. Continue POC.     OBJECTIVE IMPAIRMENTS Abnormal gait, decreased activity tolerance, decreased balance, decreased coordination, decreased knowledge of use of DME, decreased mobility, difficulty walking, decreased strength, impaired flexibility, and postural dysfunction.   ACTIVITY LIMITATIONS bending, squatting, stairs, transfers, and locomotion level  PARTICIPATION LIMITATIONS: community activity  PERSONAL FACTORS Age, Past/current experiences, Time since onset of injury/illness/exacerbation, and 3+ comorbidities: PD (dx 2019), HLD, cellulitis, lymphedema, low back pain, hearing loss   are also affecting patient's functional outcome.   REHAB POTENTIAL: Good  CLINICAL DECISION MAKING: Stable/uncomplicated  EVALUATION COMPLEXITY: Low  PLAN: PT FREQUENCY: 2x/week  PT DURATION: 8  weeks  PLANNED INTERVENTIONS: Therapeutic exercises, Therapeutic activity, Neuromuscular re-education, Balance training, Gait training, Patient/Family education, Self Care, Stair training, Vestibular training, DME instructions, and Re-evaluation  PLAN FOR NEXT SESSION: Review and update HEP as needed. Balance with eyes closed, unlevel surfaces. Continue practicing w/cane if he brings it (obstacles, turns, outside). Postural control and cervical ROM. Stepping strategies. Noted pt is scissoring his RLE more w/gait now    Cruzita Lederer Lenoir Facchini, PT, DPT  08/07/2022, 10:07 AM

## 2022-08-10 ENCOUNTER — Encounter: Payer: Self-pay | Admitting: Physical Therapy

## 2022-08-10 ENCOUNTER — Ambulatory Visit: Payer: Medicare HMO | Admitting: Physical Therapy

## 2022-08-10 DIAGNOSIS — G20 Parkinson's disease: Secondary | ICD-10-CM | POA: Diagnosis not present

## 2022-08-10 DIAGNOSIS — R2689 Other abnormalities of gait and mobility: Secondary | ICD-10-CM

## 2022-08-10 DIAGNOSIS — R2681 Unsteadiness on feet: Secondary | ICD-10-CM

## 2022-08-10 DIAGNOSIS — R293 Abnormal posture: Secondary | ICD-10-CM

## 2022-08-10 DIAGNOSIS — M6281 Muscle weakness (generalized): Secondary | ICD-10-CM

## 2022-08-10 NOTE — Therapy (Signed)
OUTPATIENT PHYSICAL THERAPY NEURO TREATMENT   Patient Name: [name] MRN: [id number] DOB:12/22/1945, 76 y.o., male Today's Date: 08/10/2022   PCP: Lesleigh Noe, MD REFERRING PROVIDER: Frann Rider, NP   PT End of Session - 08/10/22 0931     Visit Number 6    Number of Visits 13    Date for PT Re-Evaluation 09/23/22    Authorization Type [employer] - 12 visits approved 07/28/22 - 09/01/22    PT Start Time 0930    PT Stop Time 1013    PT Time Calculation (min) 43 min    Equipment Utilized During Treatment Gait belt    Activity Tolerance Patient tolerated treatment well    Behavior During Therapy WFL for tasks assessed/performed                Past Medical History:  Diagnosis Date   Agatston coronary artery calcium score greater than 400    Coronary calcium score of 533. This was 69th percentile for age,   Aortic atherosclerosis ([place])    Hyperlipidemia LDL goal <70    Parkinson disease [facility])    Past Surgical History:  Procedure Laterality Date   TONSILLECTOMY     Patient Active Problem List   Diagnosis Date Noted   Counseling regarding advanced directives and goals of care 07/19/2022   COVID-19 07/07/2022   Agatston coronary artery calcium score greater than 400 02/02/2022   Aortic atherosclerosis ([place]) 02/01/2022   Irregular heart rate 01/12/2022   OSA (obstructive sleep apnea) 01/12/2022   Parkinson's disease ([place]) 11/25/2020   Edema of right lower leg 11/25/2020   Hyperlipidemia LDL goal <70 11/25/2020   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   Hearing loss 11/25/2020   Pityriasis rosea 11/25/2020    ONSET DATE: 06/26/2022  REFERRING DIAG: G20 (ICD-10-CM) - Parkinson's disease ([place]) R26.81 (ICD-10-CM) - Gait instability    THERAPY DIAG:  Unsteadiness on feet  Other abnormalities of gait and mobility  Abnormal posture  Muscle weakness (generalized)  Rationale for Evaluation and Treatment Rehabilitation  SUBJECTIVE:                                                                                                                                                                                               SUBJECTIVE STATEMENT: Uses his StrongArm cane for shorter distances and rollator for longer distances. Working on the balance at home. No falls.   Pt accompanied by: self - family member in lobby.   PERTINENT HISTORY: PD (dx 2019), HLD, cellulitis, lymphedema, low back pain, hearing loss    PAIN:  Are you having pain?  No  PRECAUTIONS: Fall   PATIENT GOALS Wants to improve stability, not feel like he is gonna shift, work on balance with EC, wants to be able to get up from the floor, trying a cane.   OBJECTIVE:   POSTURE: rounded shoulders, forward head, increased thoracic kyphosis, and flexed trunk    TODAY'S TREATMENT:  NMR: With pt supine on mat and single pillow behind head, performed x15 reps cervical retraction with initial cues for technique and 5 second holds.   Pt performs PWR! Moves in Supine position x 10 reps   PWR! Up for improved posture - through shoulders x10 reps with initial tactile cues for technique, x10 reps PWR up through hips into bridge position, cues for full hip extension.   PWR! Rock for improved weighshifting - cues to press through legs to help with weight shift, visual cue on where to reach to on mat   PWR! Twist for improved trunk rotation - cues to reset with tall posture in midline   PWR! Step for improved step initiation - with use of 5lb ankle weights, performed as double step out and in, then progressed to adding in a scoot for hip extension.   Cues provided for larger amplitude movement patterns.    On blue foam beam at edge of mat table: x2 reps sit <> stands without UE support (took a few attempts initially to come to standing), in standing performed mini squat and then PWR Up posture x10 reps for posture, pt almost losing his balance a couple times forwards, but able  to maintain or needing to tap the chair for balance.   Gait Training:  Gait pattern:   , step through pattern, decreased arm swing- Left, decreased stride length, decreased hip/knee flexion- Right, decreased hip/knee flexion- Left, scissoring, lateral lean- Right, trunk flexed, narrow BOS, and poor foot clearance- Right Distance walked: Into and out of clinic with rollator, 230' x 1 with cane,  Assistive device utilized:  Rollator  and StrongArm Cane Level of assistance: SBA Comments: Cued to stay closer to rollator and for posture as pt with tendency to push it too far anteriorly. Cued to also keep rollator close by when turning and going to sit down on the mat table.   Performed x2 laps with Milda Smart with focus on posture, wider BOS (esp thinking about wider BOS with RLE when going around curves as it tends to scissor).   Gait with Strong ArmCane around 6 cones for obstacle negotiation, working on posture, wider BOS, and incr step length with RLE. Performed over 15-20', down and back x2 reps. Added in cognitive challenge down and back x3 reps with pt naming foods in alphabetical order from A-Z. Pt demonstrated more forward flexed posture and decr step length with cognitive challenge. Discussed when ambulating with cane at home, to focus just on the task of walking for improved gait mechanics and balance. Will continue to work on dual tasking in session.     PATIENT EDUCATION: Education details: Continue HEP. Person educated: Patient Education method: Explanation Education comprehension: verbalized understanding   HOME EXERCISE PROGRAM: [id number]    GOALS: Goals reviewed with patient? Yes  SHORT TERM GOALS: Target date: 08/15/2022  Pt will finish assessment of BERG with LTG written. Baseline: Goal status: MET  2.  Pt will perform condition 2 of mCTSIB for at least 30 seconds in order to demo improved balance. Baseline: 24 seconds  Goal status: INITIAL  3.  Pt will  improve gait speed  with rollator  to at least 2.7 ft/sec in order to demo improved community mobility.   Baseline: 2.41 ft/sec Goal status: INITIAL  4.  Pt will improve ambulate at least 230' with LRAD (some type of cane) with supervision in order to demo improved household mobility.   Baseline:  Goal status: INITIAL    LONG TERM GOALS: Target date: 09/05/2022   Pt will be independent with final HEP with wife's supervision in order to build upon functional gains made in therapy.   Baseline:  Goal status: INITIAL  2.   Pt will perform floor transfers with SBA for improved balance recovery.  Baseline:  Goal status: INITIAL  3.  Pt will improve Berg score to 48/56 for decreased fall risk  Baseline: 42/56 Goal status: REVISED  4.  Pt will hold condition 4 of mCTSIB for at least 20 seconds for improved vestibular input for balance Baseline: 10 seconds  Goal status: INITIAL  5.  Pt will perform TUG with LRAD vs. No AD in 13.5 seconds or less in order to demo decr fall risk. Baseline:  Goal status: INITIAL  6.  Pt will perform cog TUG with LRAD vs. No AD in 14 seconds or less in order to demo improved dual tasking. Baseline: 18.59 seconds  Goal status: INITIAL  ASSESSMENT:  CLINICAL IMPRESSION: Warmed up today's session with supine PWR moves to work on posture and mobility against gravity. Continued to work on Personnel officer with Milda Smart with pt needing supervision. Worked on obstacle negotiation by weaving in and out of cones with cane and with added cognitive challenge, pt with incr forward flexed posture (needing cues intermittently throughout) and decr step length, needing CGA at times. Will continue per POC.     OBJECTIVE IMPAIRMENTS Abnormal gait, decreased activity tolerance, decreased balance, decreased coordination, decreased knowledge of use of DME, decreased mobility, difficulty walking, decreased strength, impaired flexibility, and postural dysfunction.    ACTIVITY LIMITATIONS bending, squatting, stairs, transfers, and locomotion level  PARTICIPATION LIMITATIONS: community activity  PERSONAL FACTORS Age, Past/current experiences, Time since onset of injury/illness/exacerbation, and 3+ comorbidities: PD (dx 2019), HLD, cellulitis, lymphedema, low back pain, hearing loss   are also affecting patient's functional outcome.   REHAB POTENTIAL: Good  CLINICAL DECISION MAKING: Stable/uncomplicated  EVALUATION COMPLEXITY: Low  PLAN: PT FREQUENCY: 2x/week  PT DURATION: 8 weeks  PLANNED INTERVENTIONS: Therapeutic exercises, Therapeutic activity, Neuromuscular re-education, Balance training, Gait training, Patient/Family education, Self Care, Stair training, Vestibular training, DME instructions, and Re-evaluation  PLAN FOR NEXT SESSION: Review and update HEP as needed. Balance with eyes closed, unlevel surfaces. Continue practicing w/cane if he brings it (obstacles, turns, outside). Postural control and cervical ROM. Stepping strategies. Noted pt is scissoring his RLE more w/gait now. Dual tasking    Arliss Journey, PT, DPT  08/10/2022, 10:16 AM

## 2022-08-16 ENCOUNTER — Ambulatory Visit: Payer: Medicare HMO | Attending: Family Medicine | Admitting: Physical Therapy

## 2022-08-16 DIAGNOSIS — R293 Abnormal posture: Secondary | ICD-10-CM | POA: Insufficient documentation

## 2022-08-16 DIAGNOSIS — R2689 Other abnormalities of gait and mobility: Secondary | ICD-10-CM | POA: Diagnosis not present

## 2022-08-16 DIAGNOSIS — R2681 Unsteadiness on feet: Secondary | ICD-10-CM | POA: Insufficient documentation

## 2022-08-16 DIAGNOSIS — R278 Other lack of coordination: Secondary | ICD-10-CM | POA: Diagnosis not present

## 2022-08-16 NOTE — Therapy (Signed)
OUTPATIENT PHYSICAL THERAPY NEURO TREATMENT   Patient Name: [name] MRN: [id number] DOB:1946/04/06, 76 y.o., male Today's Date: 08/16/2022   PCP: Lesleigh Noe, MD REFERRING PROVIDER: Frann Rider, NP   PT End of Session - 08/16/22 1020     Visit Number 7    Number of Visits 13    Date for PT Re-Evaluation 09/23/22    Authorization Type [employer] - 12 visits approved 07/28/22 - 09/01/22    PT Start Time 1018    PT Stop Time 1100    PT Time Calculation (min) 42 min    Equipment Utilized During Treatment Gait belt    Activity Tolerance Patient tolerated treatment well    Behavior During Therapy WFL for tasks assessed/performed                Past Medical History:  Diagnosis Date   Agatston coronary artery calcium score greater than 400    Coronary calcium score of 533. This was 69th percentile for age,   Aortic atherosclerosis ([place])    Hyperlipidemia LDL goal <70    Parkinson disease [facility])    Past Surgical History:  Procedure Laterality Date   TONSILLECTOMY     Patient Active Problem List   Diagnosis Date Noted   Counseling regarding advanced directives and goals of care 07/19/2022   COVID-19 07/07/2022   Agatston coronary artery calcium score greater than 400 02/02/2022   Aortic atherosclerosis ([place]) 02/01/2022   Irregular heart rate 01/12/2022   OSA (obstructive sleep apnea) 01/12/2022   Parkinson's disease 11/25/2020   Edema of right lower leg 11/25/2020   Hyperlipidemia LDL goal <70 11/25/2020   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   Hearing loss 11/25/2020   Pityriasis rosea 11/25/2020    ONSET DATE: 06/26/2022  REFERRING DIAG: G20 (ICD-10-CM) - Parkinson's disease ([place]) R26.81 (ICD-10-CM) - Gait instability    THERAPY DIAG:  Unsteadiness on feet  Abnormal posture  Other abnormalities of gait and mobility  Rationale for Evaluation and Treatment Rehabilitation  SUBJECTIVE:                                                                                                                                                                                               SUBJECTIVE STATEMENT: Pt reports he is in "pretty good shape". Pt reports he is still using the cane in his house and has started taking it with him when going to eat out if he feels comfortable. No pain today. No new falls   Pt accompanied by: self - family member in lobby.   PERTINENT HISTORY: PD (dx 2019), HLD,  cellulitis, lymphedema, low back pain, hearing loss    PAIN:  Are you having pain? No  PRECAUTIONS: Fall   PATIENT GOALS Wants to improve stability, not feel like he is gonna shift, work on balance with EC, wants to be able to get up from the floor, trying a cane.   OBJECTIVE:   POSTURE: rounded shoulders, forward head, increased thoracic kyphosis, and flexed trunk    TODAY'S TREATMENT:  Therapeutic Activity  STG assessment    [facility] PT Assessment - 08/16/22 1026       Ambulation/Gait   Gait velocity 32.8' over 9.63s= 3.4 ft/s w/rollator            MCTSIB:  Condition 2: Avg of 3 trials: 30 sec Condition 4: Avg of 3 trials: 23.84s   NMR  Standing on blue airex in front of mat w/chair placed in front of pt for safety, mini squat w/PWR up posture (provided by PWR certified therapist last session) for improved dynamic standing balance and posture. Pt performed x12 reps, noted increased difficulty maintaining balance as pt fatigued. Pt tends to lose balance anteriorly but is able to self-correct utilizing ankle strategy.  Sit <> stands x10 from low mat height w/10# slam ball chest throw to blue mat target roughly ~10' in front of pt for improved anterior weight shifting, high amplitude movement and immediate standing balance. Placed chair in front of pt for safety. Pt had one instance of anterior LOB which he was able to self-correct. Progressed to standing on airex for added immediate standing balance challenge, x5 reps. Pt  demonstrated increased difficulty w/immediate stabilization and anterior LOB w/foam, but was able to achieve upright posture and throw ball without instability w/added time. Finally progressed to moving mat target to 12' away from pt, x4 reps, for added power and anterior shift challenge. Pt performed well w/no instability, but had difficulty maintaining upright posture following ball throw and demonstrated poor eccentric control w/sit, relying on BUEs to control lower to mat  Tied red theraband around distal quads and had pt ambulate 115' around gym to provide tactile cues to facilitate RLE abduction w/gait, as pt tends to scissor RLE and favor L side of rollator, increasing risk of tripping over R foot or L wheel of walker. Pt had significant difficulty reducing scissoring of RLE even w/band, but was able to maintain hip-width distance of BLEs on last 30' of lap.   PATIENT EDUCATION: Education details: Goal outcomes, Continue HEP, practicing walking w/theraband at home  Person educated: Patient Education method: Explanation Education comprehension: verbalized understanding   HOME EXERCISE PROGRAM: [id number]    GOALS: Goals reviewed with patient? Yes  SHORT TERM GOALS: Target date: 08/15/2022  Pt will finish assessment of BERG with LTG written. Baseline: Goal status: MET  2.  Pt will perform condition 2 of mCTSIB for at least 30 seconds in order to demo improved balance. Baseline: 24 seconds; 30 seconds  Goal status: MET  3.  Pt will improve gait speed with rollator  to at least 2.7 ft/sec in order to demo improved community mobility.   Baseline: 2.41 ft/sec; 3.4 ft/s w/rollator  Goal status: MET  4.  Pt will improve ambulate at least 230' with LRAD (some type of cane) with supervision in order to demo improved household mobility.   Baseline: 230' w/strongarm cane  Goal status: MET    LONG TERM GOALS: Target date: 09/05/2022   Pt will be independent with final HEP with wife's  supervision in order to  build upon functional gains made in therapy.   Baseline:  Goal status: INITIAL  2.   Pt will perform floor transfers with SBA for improved balance recovery.  Baseline:  Goal status: INITIAL  3.  Pt will improve Berg score to 48/56 for decreased fall risk  Baseline: 42/56 Goal status: REVISED  4.  Pt will hold condition 4 of mCTSIB for at least 30 seconds for improved vestibular input for balance Baseline: 10 seconds; 23s on 10/4 Goal status: REVISED  5.  Pt will perform TUG with LRAD vs. No AD in 13.5 seconds or less in order to demo decr fall risk. Baseline:  Goal status: INITIAL  6.  Pt will perform cog TUG with LRAD vs. No AD in 14 seconds or less in order to demo improved dual tasking. Baseline: 18.59 seconds  Goal status: INITIAL  ASSESSMENT:  CLINICAL IMPRESSION: Emphasis of skilled PT session on STG assessment, postural control and gait kinematics. Pt has met 4/4 STGs, significantly improving his gait speed to 3.4 ft/s from 2.4 ft/s and holding condition 2 of MCTSIB for 30s. Pt also able to hold condition 4 of MCTSIB for 23s, meeting his LTG as well. Modified his MCTSIB LTG due to improvements. Pt is able to ambulate community distances w/cane and S* and has started to use cane more for short community distances. Noted pt continues to assume forward flexed posture and reliance on hip strategy, but has improved his ability to facilitate ankle strategy when cueing himself. Continue POC.    OBJECTIVE IMPAIRMENTS Abnormal gait, decreased activity tolerance, decreased balance, decreased coordination, decreased knowledge of use of DME, decreased mobility, difficulty walking, decreased strength, impaired flexibility, and postural dysfunction.   ACTIVITY LIMITATIONS bending, squatting, stairs, transfers, and locomotion level  PARTICIPATION LIMITATIONS: community activity  PERSONAL FACTORS Age, Past/current experiences, Time since onset of  injury/illness/exacerbation, and 3+ comorbidities: PD (dx 2019), HLD, cellulitis, lymphedema, low back pain, hearing loss   are also affecting patient's functional outcome.   REHAB POTENTIAL: Good  CLINICAL DECISION MAKING: Stable/uncomplicated  EVALUATION COMPLEXITY: Low  PLAN: PT FREQUENCY: 2x/week  PT DURATION: 8 weeks  PLANNED INTERVENTIONS: Therapeutic exercises, Therapeutic activity, Neuromuscular re-education, Balance training, Gait training, Patient/Family education, Self Care, Stair training, Vestibular training, DME instructions, and Re-evaluation  PLAN FOR NEXT SESSION: Review and update HEP as needed. Balance with eyes closed, unlevel surfaces. Continue practicing w/cane if he brings it (obstacles, turns, outside). Postural control and cervical ROM. Stepping strategies. Noted pt is scissoring his RLE more w/gait now. Dual tasking    [name], PT, DPT  08/16/2022, 11:01 AM

## 2022-08-18 ENCOUNTER — Ambulatory Visit: Payer: Medicare HMO | Admitting: Physical Therapy

## 2022-08-18 DIAGNOSIS — R293 Abnormal posture: Secondary | ICD-10-CM | POA: Diagnosis not present

## 2022-08-18 DIAGNOSIS — R2681 Unsteadiness on feet: Secondary | ICD-10-CM

## 2022-08-18 DIAGNOSIS — R278 Other lack of coordination: Secondary | ICD-10-CM

## 2022-08-18 DIAGNOSIS — R2689 Other abnormalities of gait and mobility: Secondary | ICD-10-CM | POA: Diagnosis not present

## 2022-08-18 NOTE — Therapy (Signed)
OUTPATIENT PHYSICAL THERAPY NEURO TREATMENT   Patient Name: [name] MRN: [id number] DOB:Jan 27, 1946, 76 y.o., male Today's Date: 08/18/2022   PCP: Lesleigh Noe, MD REFERRING PROVIDER: Frann Rider, NP   PT End of Session - 08/18/22 0932     Visit Number 8    Number of Visits 13    Date for PT Re-Evaluation 09/23/22    Authorization Type [employer] - 12 visits approved 07/28/22 - 09/01/22    PT Start Time 0932    PT Stop Time 4782    PT Time Calculation (min) 42 min    Equipment Utilized During Treatment Gait belt    Activity Tolerance Patient tolerated treatment well    Behavior During Therapy WFL for tasks assessed/performed                 Past Medical History:  Diagnosis Date   Agatston coronary artery calcium score greater than 400    Coronary calcium score of 533. This was 69th percentile for age,   Aortic atherosclerosis ([place])    Hyperlipidemia LDL goal <70    Parkinson disease [facility])    Past Surgical History:  Procedure Laterality Date   TONSILLECTOMY     Patient Active Problem List   Diagnosis Date Noted   Counseling regarding advanced directives and goals of care 07/19/2022   COVID-19 07/07/2022   Agatston coronary artery calcium score greater than 400 02/02/2022   Aortic atherosclerosis ([place]) 02/01/2022   Irregular heart rate 01/12/2022   OSA (obstructive sleep apnea) 01/12/2022   Parkinson's disease 11/25/2020   Edema of right lower leg 11/25/2020   Hyperlipidemia LDL goal <70 11/25/2020   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 Pt reports he is trying to think harder when he walks to reduce his RLE from scissoring. No new changes.   Pt accompanied by: self - family member in lobby.   PERTINENT HISTORY: PD (dx 2019), HLD, cellulitis, lymphedema, low back pain, hearing loss    PAIN:  Are you  having pain? No  PRECAUTIONS: Fall   PATIENT GOALS Wants to improve stability, not feel like he is gonna shift, work on balance with EC, wants to be able to get up from the floor, trying a cane.   OBJECTIVE:   POSTURE: rounded shoulders, forward head, increased thoracic kyphosis, and flexed trunk    TODAY'S TREATMENT:  Ther Ex  SciFit multi-peaks level 8 for 8 minutes using BUE/BLEs for neural priming for reciprocal movement, dynamic cardiovascular warmup and increased upper thoracic rotation.    NMR  In // bars for improved stepping strategies, single leg stability and ankle strategy: Standing on blue airex in front of mirror for visual biofeedback, alt fwd step over 4" hurdle to colored dot target w/mix of BUE and single UE support. Pt unable to attempt without UE support due to anterior LOB. Noted significant difficulty stepping w/RLE and instability w/retro step back to foam (mix of adduction and ER of hip). Min cues for linear path of step w/RLE and pt regressed to stepping w/RLE only x15 reps, no adduction noted  Progressed to lateral step overs w/BUE support, x15 per side. Min cues for upright posture and to choose target on wall to stare at to prevent looking down. Noted deceased step clearance w/LLE > RLE  PWR Up (provided by [facility] certified therapist in previous [place]) on rockerboard in A/P direction, x20 reps for improved postural control and ankle strategy. Pt has to perform slowly, but is able to maintain his balance well  w/slower movement. Noted continued genu valgus of R knee but otherwise good hip/ankle position throughout. Min cues for full trunk extension.   Gait pattern: step through pattern, decreased step length- Right, decreased stride length, decreased hip/knee flexion- Right, decreased ankle dorsiflexion- Right, scissoring, decreased trunk rotation, trunk flexed, narrow BOS, and poor foot clearance- Right Distance walked: Various clinic distances  Assistive device utilized: Walker - 4 wheeled Level of assistance: Modified independence Comments: Pt continues to demonstrate scissoring of RLE that he is starting to be able to self-correct    PATIENT EDUCATION: Education details: Continue HEP Person educated: Patient Education method: Explanation Education comprehension: verbalized understanding   HOME EXERCISE PROGRAM: [id number]    GOALS: Goals reviewed with patient? Yes  SHORT TERM GOALS: Target date: 08/15/2022  Pt will finish assessment of BERG with LTG written. Baseline: Goal status: MET  2.  Pt will perform condition 2 of mCTSIB for at least 30 seconds in order to demo improved balance. Baseline: 24 seconds; 30 seconds  Goal status: MET  3.  Pt will improve gait speed with rollator  to at least 2.7 ft/sec in order to demo improved community mobility.   Baseline: 2.41 ft/sec; 3.4 ft/s w/rollator  Goal status: MET  4.  Pt will improve ambulate at least 230' with LRAD (some type of cane) with supervision in order to demo improved household mobility.   Baseline: 230' w/strongarm cane  Goal status: MET    LONG TERM GOALS: Target date: 09/05/2022   Pt will be independent with final HEP with wife's supervision in order to build upon functional gains made in therapy.   Baseline:  Goal status: INITIAL  2.   Pt will perform floor transfers with SBA for improved balance recovery.  Baseline:  Goal status: INITIAL  3.  Pt will improve Berg score to 48/56 for decreased fall  risk  Baseline: 42/56 Goal status: REVISED  4.  Pt will hold condition 4 of mCTSIB for at least 30 seconds for  improved vestibular input for balance Baseline: 10 seconds; 23s on 10/4 Goal status: REVISED  5.  Pt will perform TUG with LRAD vs. No AD in 13.5 seconds or less in order to demo decr fall risk. Baseline:  Goal status: INITIAL  6.  Pt will perform cog TUG with LRAD vs. No AD in 14 seconds or less in order to demo improved dual tasking. Baseline: 18.59 seconds  Goal status: INITIAL  ASSESSMENT:  CLINICAL IMPRESSION: Emphasis of skilled PT session on proper foot placement, single leg stability, postural control and ankle strategy. Pt continues to demonstrate adduction and ER of RLE w/gait and forward stepping activities, requiring min cues for proper foot placement. Pt demonstrates improved ankle strategy w//dynamic balance tasks but continues to assume forward flexed posture. Continue POC.    OBJECTIVE IMPAIRMENTS Abnormal gait, decreased activity tolerance, decreased balance, decreased coordination, decreased knowledge of use of DME, decreased mobility, difficulty walking, decreased strength, impaired flexibility, and postural dysfunction.   ACTIVITY LIMITATIONS bending, squatting, stairs, transfers, and locomotion level  PARTICIPATION LIMITATIONS: community activity  PERSONAL FACTORS Age, Past/current experiences, Time since onset of injury/illness/exacerbation, and 3+ comorbidities: PD (dx 2019), HLD, cellulitis, lymphedema, low back pain, hearing loss   are also affecting patient's functional outcome.   REHAB POTENTIAL: Good  CLINICAL DECISION MAKING: Stable/uncomplicated  EVALUATION COMPLEXITY: Low  PLAN: PT FREQUENCY: 2x/week  PT DURATION: 8 weeks  PLANNED INTERVENTIONS: Therapeutic exercises, Therapeutic activity, Neuromuscular re-education, Balance training, Gait training, Patient/Family education, Self Care, Stair training, Vestibular training, DME  instructions, and Re-evaluation  PLAN FOR NEXT SESSION: Review and update HEP as needed. Balance with eyes closed, unlevel surfaces. Continue practicing w/cane if he brings it (obstacles, turns, outside). Postural control and cervical ROM. Stepping strategies. Noted pt is scissoring his RLE more w/gait now. Dual tasking    Cruzita Lederer Plaster, PT, DPT  08/18/2022, 10:17 AM

## 2022-08-23 ENCOUNTER — Ambulatory Visit: Payer: Medicare HMO | Admitting: Physical Therapy

## 2022-08-23 ENCOUNTER — Encounter: Payer: Self-pay | Admitting: Physical Therapy

## 2022-08-23 DIAGNOSIS — R278 Other lack of coordination: Secondary | ICD-10-CM

## 2022-08-23 DIAGNOSIS — R2681 Unsteadiness on feet: Secondary | ICD-10-CM

## 2022-08-23 DIAGNOSIS — R2689 Other abnormalities of gait and mobility: Secondary | ICD-10-CM

## 2022-08-23 DIAGNOSIS — R293 Abnormal posture: Secondary | ICD-10-CM

## 2022-08-23 NOTE — Therapy (Signed)
OUTPATIENT PHYSICAL THERAPY NEURO TREATMENT   Patient Name: [name] MRN: [id number] DOB:Oct 16, 1946, 76 y.o., male Today's Date: 08/23/2022   PCP: Lesleigh Noe, MD REFERRING PROVIDER: Frann Rider, NP   PT End of Session - 08/23/22 0936     Visit Number 9    Number of Visits 13    Date for PT Re-Evaluation 09/23/22    Authorization Type [employer] - 12 visits approved 07/28/22 - 09/01/22    PT Start Time 0934    PT Stop Time 1014    PT Time Calculation (min) 40 min    Equipment Utilized During Treatment Gait belt    Activity Tolerance Patient tolerated treatment well    Behavior During Therapy WFL for tasks assessed/performed                 Past Medical History:  Diagnosis Date   Agatston coronary artery calcium score greater than 400    Coronary calcium score of 533. This was 69th percentile for age,   Aortic atherosclerosis ([place])    Hyperlipidemia LDL goal <70    Parkinson disease    Past Surgical History:  Procedure Laterality Date   TONSILLECTOMY     Patient Active Problem List   Diagnosis Date Noted   Counseling regarding advanced directives and goals of care 07/19/2022   COVID-19 07/07/2022   Agatston coronary artery calcium score greater than 400 02/02/2022   Aortic atherosclerosis ([place]) 02/01/2022   Irregular heart rate 01/12/2022   OSA (obstructive sleep apnea) 01/12/2022   Parkinson's disease 11/25/2020   Edema of right lower leg 11/25/2020   Hyperlipidemia LDL goal <70 11/25/2020   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   Hearing loss 11/25/2020   Pityriasis rosea 11/25/2020    ONSET DATE: 06/26/2022  REFERRING DIAG: G20 (ICD-10-CM) - Parkinson's disease ([place]) R26.81 (ICD-10-CM) - Gait instability    THERAPY DIAG:  Unsteadiness on feet  Abnormal posture  Other lack of coordination  Other abnormalities of gait and mobility  Rationale for Evaluation and Treatment Rehabilitation  SUBJECTIVE:                                                                                                                                                                                               SUBJECTIVE STATEMENT: Has been using his cane more at home. Needs to focus more of slowing down. No falls.   Pt accompanied by: self - family member in lobby.   PERTINENT HISTORY: PD (dx 2019), HLD, cellulitis, lymphedema, low back pain, hearing loss    PAIN:  Are you having pain? No  PRECAUTIONS:  Fall   PATIENT GOALS Wants to improve stability, not feel like he is gonna shift, work on balance with EC, wants to be able to get up from the floor, trying a cane.   OBJECTIVE:   POSTURE: rounded shoulders, forward head, increased thoracic kyphosis, and flexed trunk    TODAY'S TREATMENT:  Therapeutic Activity  Practiced getting off and off the floor x2 reps during today's session. When going down to the floor, pt goes from sitting on mat table and holding onto chair to help get into tall kneeling position. When going from floor > stand  > mat table, pt able to get into half kneel position (cued to rock back first and then take a big step with RLE) and then press up from chair/mat table and slowly stand and hold onto chair while turning to sit back down to mat table, therapist providing CGA for safety (esp during 2nd rep due to pt only wearing socks as it was easier to bring foot forwards on floor mat). Pt reports that he practices at home getting on and off the floor with no issues and he has a sturdy chair that he can use to hold onto and his wife provides supervision at times.   NMR  On red mat on floor:  Tall kneeling mini squats without UE support, cues for bringing belly button forwards and hip extension and holding in tall posture x10 reps  In tall kneel, x10 reps side stepping hip ABD each side with red tband, pt needing to use BUE support for balance, cued for tall posture throughout  Half kneel static holds 2 x 30 seconds  bilat, 2 x 5 reps head turns each side, verbal/tactile cues for upright posture and use of hip extensors for balance. Pt more challenged when RLE is posteriorly, needing to use intermittent UE support, esp with use of head turns.  Modified PWR Step on floor with use of chair anteriorly for balance for step initiation - cued to rock hips back first and then take a big step forwards and then come up into tall PWR UP posture - pt more challenged bringing LLE forwards, pt more unsteady in PWR Up posture with RLE posteriorly, x6 reps each side. Pt more tired after these.   Sit to stands without UE support with ball toss with 2nd PT while going to stand up and then going to sit down, performed x6 reps, then performed x3 reps from air ex (ball toss only in standing as pt had to use hands to stand) - during last rep performed standing on foam and performing ball toss x10 reps with 2nd PT for anticipatory balance in different directions.     Gait pattern: step through pattern, decreased step length- Right, decreased stride length, decreased hip/knee flexion- Right, decreased ankle dorsiflexion- Right, scissoring, decreased trunk rotation, trunk flexed, narrow BOS, and poor foot clearance- Right Distance walked: Various clinic distances  Assistive device utilized: Walker - 4 wheeled Level of assistance: Modified independence Comments: Pt continues to demonstrate scissoring of RLE that he is starting to be able to self-correct    PATIENT EDUCATION: Education details: Continue HEP Person educated: Patient Education method: Explanation Education comprehension: verbalized understanding   HOME EXERCISE PROGRAM: [id number]    GOALS: Goals reviewed with patient? Yes  SHORT TERM GOALS: Target date: 08/15/2022  Pt will finish assessment of BERG with LTG written. Baseline: Goal status: MET  2.  Pt will perform condition 2 of mCTSIB for at least 30 seconds  in order to demo improved balance. Baseline: 24  seconds; 30 seconds  Goal status: MET  3.  Pt will improve gait speed with rollator  to at least 2.7 ft/sec in order to demo improved community mobility.   Baseline: 2.41 ft/sec; 3.4 ft/s w/rollator  Goal status: MET  4.  Pt will improve ambulate at least 230' with LRAD (some type of cane) with supervision in order to demo improved household mobility.   Baseline: 230' w/strongarm cane  Goal status: MET    LONG TERM GOALS: Target date: 09/05/2022   Pt will be independent with final HEP with wife's supervision in order to build upon functional gains made in therapy.   Baseline:  Goal status: INITIAL  2.   Pt will perform floor transfers with SBA for improved balance recovery.  Baseline:  Goal status: INITIAL  3.  Pt will improve Berg score to 48/56 for decreased fall risk  Baseline: 42/56 Goal status: REVISED  4.  Pt will hold condition 4 of mCTSIB for at least 30 seconds for improved vestibular input for balance Baseline: 10 seconds; 23s on 10/4 Goal status: REVISED  5.  Pt will perform TUG with LRAD vs. No AD in 13.5 seconds or less in order to demo decr fall risk. Baseline:  Goal status: INITIAL  6.  Pt will perform cog TUG with LRAD vs. No AD in 14 seconds or less in order to demo improved dual tasking. Baseline: 18.59 seconds  Goal status: INITIAL  ASSESSMENT:  CLINICAL IMPRESSION: Today's skilled session focused on floor <> stand transfers with use of mat table, BLE strengthening on the floor/sit <> stands, and balance. Pt more challenged with half kneeling activities when RLE is posteriorly, needing intermittent support for balance. When performing floor > stand transfers, pt is able to stand when bringing RLE forwards in a half kneel position and use of UE support to stand. Pt more tired after performing exercises on the floor today. Will continue per POC.    OBJECTIVE IMPAIRMENTS Abnormal gait, decreased activity tolerance, decreased balance, decreased  coordination, decreased knowledge of use of DME, decreased mobility, difficulty walking, decreased strength, impaired flexibility, and postural dysfunction.   ACTIVITY LIMITATIONS bending, squatting, stairs, transfers, and locomotion level  PARTICIPATION LIMITATIONS: community activity  PERSONAL FACTORS Age, Past/current experiences, Time since onset of injury/illness/exacerbation, and 3+ comorbidities: PD (dx 2019), HLD, cellulitis, lymphedema, low back pain, hearing loss   are also affecting patient's functional outcome.   REHAB POTENTIAL: Good  CLINICAL DECISION MAKING: Stable/uncomplicated  EVALUATION COMPLEXITY: Low  PLAN: PT FREQUENCY: 2x/week  PT DURATION: 8 weeks  PLANNED INTERVENTIONS: Therapeutic exercises, Therapeutic activity, Neuromuscular re-education, Balance training, Gait training, Patient/Family education, Self Care, Stair training, Vestibular training, DME instructions, and Re-evaluation  PLAN FOR NEXT SESSION: Will need 10th visit PN. Review and update HEP as needed. Balance with eyes closed, unlevel surfaces. Continue practicing w/cane if he brings it (obstacles, turns, outside). Postural control and cervical ROM. Stepping strategies. Noted pt is scissoring his RLE more w/gait now. Dual tasking    Arliss Journey, PT, DPT  08/23/2022, 11:41 AM

## 2022-08-25 ENCOUNTER — Ambulatory Visit: Payer: Medicare HMO | Admitting: Physical Therapy

## 2022-08-25 ENCOUNTER — Encounter: Payer: Self-pay | Admitting: Physical Therapy

## 2022-08-25 DIAGNOSIS — R293 Abnormal posture: Secondary | ICD-10-CM

## 2022-08-25 DIAGNOSIS — R2681 Unsteadiness on feet: Secondary | ICD-10-CM | POA: Diagnosis not present

## 2022-08-25 DIAGNOSIS — R2689 Other abnormalities of gait and mobility: Secondary | ICD-10-CM | POA: Diagnosis not present

## 2022-08-25 DIAGNOSIS — R278 Other lack of coordination: Secondary | ICD-10-CM

## 2022-08-25 NOTE — Therapy (Signed)
OUTPATIENT PHYSICAL THERAPY NEURO TREATMENT/10th VISIT PN    Patient Name: [name] MRN: [id number] DOB:1946-07-07, 76 y.o., male Today's Date: 08/25/2022  10th Visit Physical Therapy Progress Note  Dates of Reporting Period: 07/25/22 to 08/25/22     PCP: Lesleigh Noe, MD REFERRING PROVIDER: Frann Rider, NP   PT End of Session - 08/25/22 0931     Visit Number 10    Number of Visits 13    Date for PT Re-Evaluation 09/23/22    Authorization Type [employer] - 12 visits approved 07/28/22 - 09/01/22    PT Start Time 0930    PT Stop Time 1012    PT Time Calculation (min) 42 min    Equipment Utilized During Treatment Gait belt    Activity Tolerance Patient tolerated treatment well    Behavior During Therapy WFL for tasks assessed/performed                  Past Medical History:  Diagnosis Date   Agatston coronary artery calcium score greater than 400    Coronary calcium score of 533. This was 69th percentile for age,   Aortic atherosclerosis ([place])    Hyperlipidemia LDL goal <70    Parkinson disease    Past Surgical History:  Procedure Laterality Date   TONSILLECTOMY     Patient Active Problem List   Diagnosis Date Noted   Counseling regarding advanced directives and goals of care 07/19/2022   COVID-19 07/07/2022   Agatston coronary artery calcium score greater than 400 02/02/2022   Aortic atherosclerosis ([place]) 02/01/2022   Irregular heart rate 01/12/2022   OSA (obstructive sleep apnea) 01/12/2022   Parkinson's disease 11/25/2020   Edema of right lower leg 11/25/2020   Hyperlipidemia LDL goal <70 11/25/2020   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 No falls. Was sore the other day after floor exercises.   Pt accompanied by: self - family member in lobby.   PERTINENT HISTORY: PD (dx 2019), HLD, cellulitis, lymphedema, low back pain,  hearing loss    PAIN:  Are you having pain? No  PRECAUTIONS: Fall   PATIENT GOALS Wants to improve stability, not feel like he is gonna shift, work on balance with EC, wants to be able to get up from the floor, trying a cane.   OBJECTIVE:   POSTURE: rounded shoulders, forward head, increased thoracic kyphosis, and flexed trunk    TODAY'S TREATMENT:   TUG: With rollator 16.2 seconds, with no AD: 13.95 seconds with supervision Cog TUG: 15.9 seconds with no AD   Therapeutic Activity  Practiced getting off and off the floor x1 rep during today's session. When going down to the floor, pt goes from sitting on mat table and holding onto chair to help get into tall kneeling position. When going from floor > stand  > mat table, pt able to get into half kneel position (cued to rock back first and then take a big step with RLE) and then press up from chair/mat table and slowly stand and hold onto chair while turning to sit back down to mat table, therapist providing supervision/CGA for safety   NMR  On red mat on floor:  Tall kneeling: 10 reps head turns, 10 reps head nods, cues for posture/hip extension when performing. Pt able to perform with no UE support  In quadruped: x10 reps alternating hip extension leg kicks, cues for proper pelvic alignment and incr hip extension, cues to hold each leg in the air for 5 seconds, pt unable at times for balance, but able to maintain.  Half kneel static holds; x30 seconds  bilat, then adding in trunk rotation across body to tap therapist's hand as a target x10 reps each side, pt needing to use single UE support for balance. Verbal/tactile cues for posture. Incr difficulty in this position with RLE posteriorly.  In quadruped x10 reps PWR Twist for improved trunk rotation, cued to look up at hand. 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  On blue foam beam in // bars: lateral step and weight shift and then step back to midline, x12 reps each side with BUE support > none, with cues for posture and foot clearance. Pt with more forward flexed posture without UE support. Alternating posterior step and weight shift on and off beam, x10 reps each side, pt needing fingertip support for balance.      Gait pattern: step through pattern, decreased step length- Right, decreased stride length, decreased hip/knee flexion- Right, decreased ankle dorsiflexion- Right, scissoring, decreased trunk rotation, trunk flexed, narrow BOS, and poor foot clearance- Right Distance walked: Various clinic distances  Assistive device utilized: Walker - 4 wheeled Level of assistance: Modified independence Comments: Pt continues to demonstrate scissoring of RLE that he is starting to be able to self-correct, pt reports that he is working on this at home.    PATIENT EDUCATION: Education details: Continue HEP, will plan to check remainder of goals next week and potentially plan for D/C  Person educated: Patient Education method: Explanation Education comprehension: verbalized understanding   HOME EXERCISE PROGRAM: [id number]    GOALS: Goals reviewed with patient? Yes  SHORT TERM GOALS: Target date: 08/15/2022  Pt will finish assessment of BERG with LTG written. Baseline: Goal status: MET  2.  Pt will perform condition 2 of mCTSIB for at least 30 seconds in order to demo  improved balance. Baseline: 24 seconds; 30 seconds  Goal  status: MET  3.  Pt will improve gait speed with rollator  to at least 2.7 ft/sec in order to demo improved community mobility.   Baseline: 2.41 ft/sec; 3.4 ft/s w/rollator  Goal status: MET  4.  Pt will improve ambulate at least 230' with LRAD (some type of cane) with supervision in order to demo improved household mobility.   Baseline: 230' w/strongarm cane  Goal status: MET    LONG TERM GOALS: Target date: 09/05/2022   Pt will be independent with final HEP with wife's supervision in order to build upon functional gains made in therapy.   Baseline:  Goal status: INITIAL  2.   Pt will perform floor transfers with SBA for improved balance recovery.  Baseline:  Goal status: INITIAL  3.  Pt will improve Berg score to 48/56 for decreased fall risk  Baseline: 42/56 Goal status: REVISED  4.  Pt will hold condition 4 of mCTSIB for at least 30 seconds for improved vestibular input for balance Baseline: 10 seconds; 23s on 10/4 Goal status: REVISED  5.  Pt will perform TUG with LRAD vs. No AD in 13.5 seconds or less in order to demo decr fall risk. Baseline: 15.66   with rollator, 13.53 sconds no AD, min guard while turning; 13.95 seconds on 08/25/22 with supervision  Goal status: PROGRESSING  6.  Pt will perform cog TUG with LRAD vs. No AD in 14 seconds or less in order to demo improved dual tasking. Baseline: 18.59 seconds ; 15.9 seconds with no AD on 08/25/22 Goal status: PROGRESSING    ASSESSMENT:  CLINICAL IMPRESSION: 10th visit PN: Began to check LTGs with pt progressing towards LTGs #5 and #6. Pt improved cog TUG to 15.9 seconds with no AD (previously was 18.59 seconds with rollator), indicating improved dual tasking, but not quite to goal level. When STGs checked, pt improved condition 4 of mCTSIB to 23 seconds (previously 10 seconds), indicating improved vestibular input for balance. Remainder of session focused  on floor transfers with strengthening/balance in tall kneeling/quadruped/half kneel and balance on compliant surfaces. Pt fatigues easily in half kneeling position. Pt able to tolerate session well. Will continue to progress towards LTGs.  Today's skilled session focused on floor <> stand transfers with use of mat table, BLE strengthening on the floor/sit <> stands, and balance. Pt more challenged with half kneeling activities when RLE is posteriorly, needing intermittent support for balance. When performing floor > stand transfers, pt is able to stand when bringing RLE forwards in a half kneel position and use of UE support to stand. Pt more tired after performing exercises on the floor today. Will continue per POC.    OBJECTIVE IMPAIRMENTS Abnormal gait, decreased activity tolerance, decreased balance, decreased coordination, decreased knowledge of use of DME, decreased mobility, difficulty walking, decreased strength, impaired flexibility, and postural dysfunction.   ACTIVITY LIMITATIONS bending, squatting, stairs, transfers, and locomotion level  PARTICIPATION LIMITATIONS: community activity  PERSONAL FACTORS Age, Past/current experiences, Time since onset of injury/illness/exacerbation, and 3+ comorbidities: PD (dx 2019), HLD, cellulitis, lymphedema, low back pain, hearing loss   are also affecting patient's functional outcome.   REHAB POTENTIAL: Good  CLINICAL DECISION MAKING: Stable/uncomplicated  EVALUATION COMPLEXITY: Low  PLAN: PT FREQUENCY: 2x/week  PT DURATION: 8 weeks  PLANNED INTERVENTIONS: Therapeutic exercises, Therapeutic activity, Neuromuscular re-education, Balance training, Gait training, Patient/Family education, Self Care, Stair training, Vestibular training, DME instructions, and Re-evaluation  PLAN FOR NEXT SESSION: LTGs due this week. Potentially D/C??   Review and  update HEP as needed. Balance with eyes closed, unlevel surfaces. Continue practicing w/cane if he  brings it (obstacles, turns, outside). Postural control and cervical ROM. Stepping strategies. Noted pt is scissoring his RLE more w/gait now. Dual tasking    Arliss Journey, PT, DPT  08/25/2022, 10:14 AM

## 2022-08-30 ENCOUNTER — Ambulatory Visit: Payer: Medicare HMO | Admitting: Physical Therapy

## 2022-08-30 DIAGNOSIS — R2681 Unsteadiness on feet: Secondary | ICD-10-CM

## 2022-08-30 DIAGNOSIS — R2689 Other abnormalities of gait and mobility: Secondary | ICD-10-CM | POA: Diagnosis not present

## 2022-08-30 DIAGNOSIS — R293 Abnormal posture: Secondary | ICD-10-CM

## 2022-08-30 DIAGNOSIS — R278 Other lack of coordination: Secondary | ICD-10-CM | POA: Diagnosis not present

## 2022-08-30 NOTE — Therapy (Signed)
OUTPATIENT PHYSICAL THERAPY NEURO TREATMENT   Patient Name: [name] MRN: [id number] DOB:02/14/1946, 76 y.o., male Today's Date: 08/30/2022     PCP: Lesleigh Noe, MD REFERRING PROVIDER: Frann Rider, NP   PT End of Session - 08/30/22 0935     Visit Number 11    Number of Visits 13    Date for PT Re-Evaluation 09/23/22    Authorization Type [employer] - 12 visits approved 07/28/22 - 09/01/22    PT Start Time 0933    PT Stop Time 1014    PT Time Calculation (min) 41 min    Equipment Utilized During Treatment Gait belt    Activity Tolerance Patient tolerated treatment well    Behavior During Therapy WFL for tasks assessed/performed                   Past Medical History:  Diagnosis Date   Agatston coronary artery calcium score greater than 400    Coronary calcium score of 533. This was 69th percentile for age,   Aortic atherosclerosis ([place])    Hyperlipidemia LDL goal <70    Parkinson disease    Past Surgical History:  Procedure Laterality Date   TONSILLECTOMY     Patient Active Problem List   Diagnosis Date Noted   Counseling regarding advanced directives and goals of care 07/19/2022   COVID-19 07/07/2022   Agatston coronary artery calcium score greater than 400 02/02/2022   Aortic atherosclerosis ([place]) 02/01/2022   Irregular heart rate 01/12/2022   OSA (obstructive sleep apnea) 01/12/2022   Parkinson's disease 11/25/2020   Edema of right lower leg 11/25/2020   Hyperlipidemia LDL goal <70 11/25/2020   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   Hearing loss 11/25/2020   Pityriasis rosea 11/25/2020    ONSET DATE: 06/26/2022  REFERRING DIAG: G20 (ICD-10-CM) - Parkinson's disease ([place]) R26.81 (ICD-10-CM) - Gait instability    THERAPY DIAG:  Unsteadiness on feet  Abnormal posture  Other abnormalities of gait and mobility  Rationale for Evaluation and Treatment Rehabilitation  SUBJECTIVE:                                                                                                                                                                                               SUBJECTIVE STATEMENT: No changes since last visit. States he has to really concentrate when getting up from the floor. "This leg just has a mind of its own".   Pt accompanied by: self - family member in lobby.   PERTINENT HISTORY: PD (dx 2019), HLD, cellulitis, lymphedema, low back pain, hearing loss    PAIN:  Are you having pain? No  PRECAUTIONS: Fall   PATIENT GOALS Wants to improve stability, not feel like he is gonna shift, work on balance with EC, wants to be able to get up from the floor, trying a cane.   OBJECTIVE:   POSTURE: rounded shoulders, forward head, increased thoracic kyphosis, and flexed trunk    TODAY'S TREATMENT:  Ther Act LTG Assessment   [facility] PT Assessment - 08/30/22 0944       Berg Balance Test   Sit to Stand Able to stand without using hands and stabilize independently    Standing Unsupported Able to stand safely 2 minutes    Sitting with Back Unsupported but Feet Supported on Floor or Stool Able to sit safely and securely 2 minutes    Stand to Sit Sits safely with minimal use of hands    Transfers Able to transfer safely, minor use of hands    Standing Unsupported with Eyes Closed Able to stand 10 seconds safely    Standing Unsupported with Feet Together Able to place feet together independently and stand 1 minute safely    From Standing, Reach Forward with Outstretched Arm Can reach forward >12 cm safely (5")    From Standing Position, Pick up Object from Floor Able to pick up shoe safely and easily    From Standing Position, Turn to Look Behind Over each Shoulder Looks behind from both sides and weight shifts well    Turn 360 Degrees Able to turn 360 degrees safely but slowly   6s to L, 4.91s to R   Standing Unsupported, Alternately Place Feet on Step/Stool Able to stand independently and safely and complete 8  steps in 20 seconds   19.47s   Standing Unsupported, One Foot in Front Able to plae foot ahead of the other independently and hold 30 seconds    Standing on One Leg Tries to lift leg/unable to hold 3 seconds but remains standing independently    Total Score 49    Berg comment: Moderate fall risk             NMR  Standing on airex w/back to corner, ball tosses to therapist for improved reactive and anticipatory balance strategies, visual scanning and reaching out of BOS. Pt had one single LOB to L side that he was able to correct using the wall. Noted proper utilization of hip strategy to reach out of BOS to catch ball. No retropulsion throughout.  At counter, mod plantigrade bird dogs for improved postural control, core stability and reciprocal sequence of UE/LE. CGA throughout, x10 per side  Scap push-ups in modified plantigrade position, x10, for improved periscapular strength and postural control.  Min cues to avoid flexing elbows throughout and tactile cues provided to promote scapular retraction.    Gait pattern: step through pattern, decreased step length- Right, decreased stride length, decreased hip/knee flexion- Right, decreased ankle dorsiflexion- Right, scissoring, decreased trunk rotation, trunk flexed, narrow BOS, and poor foot clearance- Right Distance walked: Various clinic distances  Assistive device utilized: Walker - 4 wheeled Level of assistance: Modified independence Comments: Pt continues to demonstrate scissoring of RLE that he is starting to be able to self-correct, pt reports that he is working on this at home.    PATIENT EDUCATION: Education details: Continue HEP, Berg outcome, plan to DC next session  Person educated: Patient Education method: Explanation Education comprehension: verbalized understanding   HOME EXERCISE PROGRAM: [id number]    GOALS: Goals reviewed with patient? Yes  SHORT  TERM GOALS: Target date: 08/15/2022  Pt will finish assessment  of BERG with LTG written. Baseline: Goal status: MET  2.  Pt will perform condition 2 of mCTSIB for at least 30 seconds in order to demo improved balance. Baseline: 24 seconds; 30 seconds  Goal status: MET  3.  Pt will improve gait speed with rollator  to at least 2.7 ft/sec in order to demo improved community mobility.   Baseline: 2.41 ft/sec; 3.4 ft/s w/rollator  Goal status: MET  4.  Pt will improve ambulate at least 230' with LRAD (some type of cane) with supervision in order to demo improved household mobility.   Baseline: 230' w/strongarm cane  Goal status: MET    LONG TERM GOALS: Target date: 09/05/2022   Pt will be independent with final HEP with wife's supervision in order to build upon functional gains made in therapy.   Baseline:  Goal status: MET  2.   Pt will perform floor transfers with SBA for improved balance recovery.  Baseline: pt performing independently at home  Goal status: MET  3.  Pt will improve Berg score to 48/56 for decreased fall risk  Baseline: 42/56; 49/56 on 10/18 Goal status: MET  4.  Pt will hold condition 4 of mCTSIB for at least 30 seconds for improved vestibular input for balance Baseline: 10 seconds; 23s on 10/4 Goal status: REVISED  5.  Pt will perform TUG with LRAD vs. No AD in 13.5 seconds or less in order to demo decr fall risk. Baseline: 15.66   with rollator, 13.53 sconds no AD, min guard while turning; 13.95 seconds on 08/25/22 with supervision  Goal status: PROGRESSING  6.  Pt will perform cog TUG with LRAD vs. No AD in 14 seconds or less in order to demo improved dual tasking. Baseline: 18.59 seconds ; 15.9 seconds with no AD on 08/25/22 Goal status: PROGRESSING    ASSESSMENT:  CLINICAL IMPRESSION: Emphasis of skilled PT session on initiating LTG assessment and postural control. Pt has met 3/6 LTGs so far, performing floor transfers independently at home and improving his score on Berg to 49/56, indicative of moderate  fall risk. Pt continues to have difficulty w/upright posture and single leg stability, but has significantly improved his balance and global strength since eval. Pt in agreement to DC next session and is on track to meet remainder of LTGs. Continue POC.    OBJECTIVE IMPAIRMENTS Abnormal gait, decreased activity tolerance, decreased balance, decreased coordination, decreased knowledge of use of DME, decreased mobility, difficulty walking, decreased strength, impaired flexibility, and postural dysfunction.   ACTIVITY LIMITATIONS bending, squatting, stairs, transfers, and locomotion level  PARTICIPATION LIMITATIONS: community activity  PERSONAL FACTORS Age, Past/current experiences, Time since onset of injury/illness/exacerbation, and 3+ comorbidities: PD (dx 2019), HLD, cellulitis, lymphedema, low back pain, hearing loss   are also affecting patient's functional outcome.   REHAB POTENTIAL: Good  CLINICAL DECISION MAKING: Stable/uncomplicated  EVALUATION COMPLEXITY: Low  PLAN: PT FREQUENCY: 2x/week  PT DURATION: 8 weeks  PLANNED INTERVENTIONS: Therapeutic exercises, Therapeutic activity, Neuromuscular re-education, Balance training, Gait training, Patient/Family education, Self Care, Stair training, Vestibular training, DME instructions, and Re-evaluation  PLAN FOR NEXT SESSION: Finish LTG assessment and DC   Keneisha Heckart E Ramello Cordial, PT, DPT  08/30/2022, 10:17 AM

## 2022-09-01 ENCOUNTER — Ambulatory Visit: Payer: Medicare HMO | Admitting: Physical Therapy

## 2022-09-01 ENCOUNTER — Encounter: Payer: Self-pay | Admitting: Physical Therapy

## 2022-09-01 DIAGNOSIS — R293 Abnormal posture: Secondary | ICD-10-CM | POA: Diagnosis not present

## 2022-09-01 DIAGNOSIS — R2689 Other abnormalities of gait and mobility: Secondary | ICD-10-CM | POA: Diagnosis not present

## 2022-09-01 DIAGNOSIS — R2681 Unsteadiness on feet: Secondary | ICD-10-CM | POA: Diagnosis not present

## 2022-09-01 DIAGNOSIS — R278 Other lack of coordination: Secondary | ICD-10-CM | POA: Diagnosis not present

## 2022-09-01 NOTE — Therapy (Signed)
OUTPATIENT PHYSICAL THERAPY NEURO TREATMENT/DISCHARGE SUMMARY   Patient Name: [name] MRN: [id number] DOB:[birth year], 76 y.o., male Today's Date: 09/01/2022     PCP: Lesleigh Noe, MD REFERRING PROVIDER: Frann Rider, NP   PT End of Session - 09/01/22 0936     Visit Number 12    Number of Visits 13    Date for PT Re-Evaluation 09/23/22    Authorization Type [employer] - 12 visits approved 07/28/22 - 09/01/22    PT Start Time 0934    PT Stop Time 1015    PT Time Calculation (min) 41 min    Equipment Utilized During Treatment Gait belt    Activity Tolerance Patient tolerated treatment well    Behavior During Therapy WFL for tasks assessed/performed                    Past Medical History:  Diagnosis Date   Agatston coronary artery calcium score greater than 400    Coronary calcium score of 533. This was 69th percentile for age,   Aortic atherosclerosis ([place])    Hyperlipidemia LDL goal <70    Parkinson disease    Past Surgical History:  Procedure Laterality Date   TONSILLECTOMY     Patient Active Problem List   Diagnosis Date Noted   Counseling regarding advanced directives and goals of care 07/19/2022   COVID-19 07/07/2022   Agatston coronary artery calcium score greater than 400 02/02/2022   Aortic atherosclerosis ([place]) 02/01/2022   Irregular heart rate 01/12/2022   OSA (obstructive sleep apnea) 01/12/2022   Parkinson's disease 11/25/2020   Edema of right lower leg 11/25/2020   Hyperlipidemia LDL goal <70 11/25/2020   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   Hearing loss 11/25/2020   Pityriasis rosea 11/25/2020    ONSET DATE: 06/26/2022  REFERRING DIAG: G20 (ICD-10-CM) - Parkinson's disease ([place]) R26.81 (ICD-10-CM) - Gait instability    THERAPY DIAG:  Unsteadiness on feet  Abnormal posture  Other abnormalities of gait and mobility  Rationale for Evaluation and Treatment Rehabilitation  SUBJECTIVE:                                                                                                                                                                                               SUBJECTIVE STATEMENT: No changes since he was last here. Feels like his balance has improved since starting back therapy.   Pt accompanied by: self - family member in lobby.   PERTINENT HISTORY: PD (dx 2019), HLD, cellulitis, lymphedema, low back pain, hearing loss    PAIN:  Are you having pain? No  PRECAUTIONS: Fall   PATIENT GOALS Wants to improve stability, not feel like he is gonna shift, work on balance with EC, wants to be able to get up from the floor, trying a cane.   OBJECTIVE:   POSTURE: rounded shoulders, forward head, increased thoracic kyphosis, and flexed trunk    TODAY'S TREATMENT:  Ther Act Condition 4 of mCTSIB:  With feet hip width: ranging from 6-11 seconds, performed x3 reps With wide BOS: ranging from 15-20 seconds, performed x3 reps     NMR  Modified SLS with one foot one air ex and 2nd on 6" step, performed static holds 2 x 30 seconds with focus on tall posture and back/glute extension. Pt more challenged with SLS on LLE with intermittent taps needed for balance. Performed x1 rep each side alternating UE lifts x3 raises each side.  On air ex; alternating SLS taps to 6" step x12 reps each leg, pt unable to perform without UE support, using fingertip support and cued for slowed pace.  On air ex with wide BOS, trunk rotations holding ball with both hands superiorly/laterally at a diagonal with head motions x10 reps each direction. Cues for reaching as far as he can with posture.  From elevated mat table, x10 reps sit <> stands on red beam for immediate standing balance and posture in standing, pt taking incr time for upright posture with immediate standing.  On red beam, wide BOS lateral weight shift and reach x5 reps each side, trying to perform without, pt more challenged with this.    Gait  pattern: step through pattern, decreased step length- Right, decreased stride length, decreased hip/knee flexion- Right, decreased ankle dorsiflexion- Right, scissoring, decreased trunk rotation, trunk flexed, narrow BOS, and poor foot clearance- Right Distance walked: Various clinic distances  Assistive device utilized: Walker - 4 wheeled Level of assistance: Modified independence Comments: Pt continues to demonstrate scissoring of RLE that he is starting to be able to self-correct, pt reports that he is working on this at home.    PATIENT EDUCATION: Education details: Results of goals, continue HEP, PD screens already scheduled at the end of Jan.  Person educated: Patient Education method: Explanation Education comprehension: verbalized understanding   HOME EXERCISE PROGRAM: [id number] (see [employer] for further details)   PHYSICAL THERAPY DISCHARGE SUMMARY  Visits from Start of Care: 12  Current functional level related to goals / functional outcomes: See LTGs/Clinical Assessment Statement   Remaining deficits: Postural abnormalities, gait abnormalities, impaired balance, bradykinesia, decr functional strength.    Education / Equipment: HEP, fall prevention, gait with StrongArm cane.    Patient agrees to discharge. Patient goals were partially met. Patient is being discharged due to meeting the stated rehab goals./being pleased with current functional level. Pt scheduled for PD screens at end of January for PT/OT/ST      GOALS: Goals reviewed with patient? Yes  SHORT TERM GOALS: Target date: 08/15/2022  Pt will finish assessment of BERG with LTG written. Baseline: Goal status: MET  2.  Pt will perform condition 2 of mCTSIB for at least 30 seconds in order to demo improved balance. Baseline: 24 seconds; 30 seconds  Goal status: MET  3.  Pt will improve gait speed with rollator  to at least 2.7 ft/sec in order to demo improved community mobility.   Baseline: 2.41  ft/sec; 3.4 ft/s w/rollator  Goal status: MET  4.  Pt will improve ambulate at least 230' with LRAD (some type of cane) with supervision in order to  demo improved household mobility.   Baseline: 230' w/strongarm cane  Goal status: MET    LONG TERM GOALS: Target date: 09/05/2022   Pt will be independent with final HEP with wife's supervision in order to build upon functional gains made in therapy.   Baseline:  Goal status: MET  2.   Pt will perform floor transfers with SBA for improved balance recovery.  Baseline: pt performing independently at home  Goal status: MET  3.  Pt will improve Berg score to 48/56 for decreased fall risk  Baseline: 42/56; 49/56 on 10/18 Goal status: MET  4.  Pt will hold condition 4 of mCTSIB for at least 30 seconds for improved vestibular input for balance Baseline: 10 seconds; 23s on 10/4 - ranging from 6-11 seconds on 09/01/22  Goal status: NOT MET  5.  Pt will perform TUG with LRAD vs. No AD in 13.5 seconds or less in order to demo decr fall risk. Baseline: 15.66   with rollator, 13.53 sconds no AD, min guard while turning; 13.95 seconds on 08/25/22 with supervision  Goal status: NOT MET  6.  Pt will perform cog TUG with LRAD vs. No AD in 14 seconds or less in order to demo improved dual tasking. Baseline: 18.59 seconds ; 15.9 seconds with no AD on 08/25/22 Goal status:NOT MET   ASSESSMENT:  CLINICAL IMPRESSION: Today's skilled session focused on assessing remainder of pt's LTGs for D/C. Pt did not meet LTG #4 in regards to condition 4 of mCTSIB. Pt has had improvements with balance with EC for vestibular input and continues to work on this at home. Pt did not meet LTGs #5 and #6 in regards to TUG/cog TUG. Pt did have improvements, but not quite to goal level. Pt has met LTGs #1-3. Pt is pleased with improvements with his balance at this time and has a solid HEP to perform at home. Pt able to ambulate small distances around the house using  Strong Arm cane and using rollator for remainder of mobility. Will D/C at this time with pt in agreement with plan and has PD screens scheduled at the end of January.    OBJECTIVE IMPAIRMENTS Abnormal gait, decreased activity tolerance, decreased balance, decreased coordination, decreased knowledge of use of DME, decreased mobility, difficulty walking, decreased strength, impaired flexibility, and postural dysfunction.   ACTIVITY LIMITATIONS bending, squatting, stairs, transfers, and locomotion level  PARTICIPATION LIMITATIONS: community activity  PERSONAL FACTORS Age, Past/current experiences, Time since onset of injury/illness/exacerbation, and 3+ comorbidities: PD (dx 2019), HLD, cellulitis, lymphedema, low back pain, hearing loss   are also affecting patient's functional outcome.   REHAB POTENTIAL: Good  CLINICAL DECISION MAKING: Stable/uncomplicated  EVALUATION COMPLEXITY: Low  PLAN: PT FREQUENCY: 2x/week  PT DURATION: 8 weeks  PLANNED INTERVENTIONS: Therapeutic exercises, Therapeutic activity, Neuromuscular re-education, Balance training, Gait training, Patient/Family education, Self Care, Stair training, Vestibular training, DME instructions, and Re-evaluation  PLAN FOR NEXT SESSION: D/C from PT    Arliss Journey, PT, DPT  09/01/2022, 10:20 AM

## 2022-11-21 ENCOUNTER — Ambulatory Visit (INDEPENDENT_AMBULATORY_CARE_PROVIDER_SITE_OTHER): Payer: Medicare HMO | Admitting: Family Medicine

## 2022-11-21 ENCOUNTER — Encounter: Payer: Self-pay | Admitting: Family Medicine

## 2022-11-21 VITALS — BP 110/60 | HR 71 | Temp 97.9°F | Ht 72.0 in | Wt 246.0 lb

## 2022-11-21 DIAGNOSIS — G20A1 Parkinson's disease without dyskinesia, without mention of fluctuations: Secondary | ICD-10-CM

## 2022-11-21 DIAGNOSIS — I700 Atherosclerosis of aorta: Secondary | ICD-10-CM

## 2022-11-21 DIAGNOSIS — R600 Localized edema: Secondary | ICD-10-CM

## 2022-11-21 DIAGNOSIS — G4733 Obstructive sleep apnea (adult) (pediatric): Secondary | ICD-10-CM | POA: Diagnosis not present

## 2022-11-21 DIAGNOSIS — I471 Supraventricular tachycardia, unspecified: Secondary | ICD-10-CM | POA: Diagnosis not present

## 2022-11-21 DIAGNOSIS — E785 Hyperlipidemia, unspecified: Secondary | ICD-10-CM | POA: Diagnosis not present

## 2022-11-21 DIAGNOSIS — H9190 Unspecified hearing loss, unspecified ear: Secondary | ICD-10-CM | POA: Diagnosis not present

## 2022-11-21 DIAGNOSIS — L42 Pityriasis rosea: Secondary | ICD-10-CM | POA: Diagnosis not present

## 2022-11-21 MED ORDER — CETIRIZINE HCL 10 MG PO TABS: 10.0000 mg | ORAL_TABLET | Freq: Every day | ORAL | 3 refills | Status: DC

## 2022-11-21 MED ORDER — FUROSEMIDE 20 MG PO TABS: 10.0000 mg | ORAL_TABLET | Freq: Every day | ORAL | 3 refills | Status: DC

## 2022-11-21 NOTE — Assessment & Plan Note (Signed)
Saw cardiology Dr. Radford Pax in early 2023 for irregular heart rate.( PACs seen on CPAP monitor). wore monitor... ZIO showed NSR with episodes of SVT.Marland Kitchen started on toprol XL

## 2022-11-21 NOTE — Assessment & Plan Note (Signed)
Followed by neurology Dr. On Sinemet 25/100 mg p.o. 3 times daily.  Doing physical therapy.

## 2022-11-21 NOTE — Assessment & Plan Note (Addendum)
Peripheral edema in right lower leg given history of  injury and recurrent cellulitis.  He takes low-dose furosemide 10 mg daily to help with swelling.  Wears compression hose.

## 2022-11-21 NOTE — Assessment & Plan Note (Signed)
Bilateral hearing aides.

## 2022-11-21 NOTE — Assessment & Plan Note (Signed)
Stable, chronic.  Continue current medication.  LDL goal less than 70 given aortic atherosclerosis On Crestor 10 mg daily, fenofibrate 160 mg p.o. daily

## 2022-11-21 NOTE — Progress Notes (Signed)
Patient ID: [name], male    DOB: March 07, 1946, 77 y.o.   MRN: [id number]  This visit was conducted in person.  Pulse 71   Temp 97.9 F (36.6 C) (Oral)   Ht 6' (1.829 m)   Wt 246 lb (111.6 kg)   SpO2 98%   BMI 33.36 kg/m    CC:  Chief Complaint  Patient presents with   Transfer of Care    Subjective:   HPI: [name] is a 77 y.o. male presenting on 11/21/2022 for Transfer of Care   Previous PCP: [name] Last CPX: 04/19/2022  Parkinson's disease: Diagnosed in 2019 Followed by neurology Dr. Rexene Bradley.. reviewed last OV note from 06/2022 On Sinemet 25/100 mg p.o. 3 times daily.  Doing physical therapy.  Elevated Cholesterol: LDL goal less than 70 given aortic atherosclerosis On Crestor 10 mg daily, fenofibrate 160 mg p.o. daily Saw cardiology Dr. Radford Bradley in early 2023 for irregular heart rate.( PACs seen on CPAP monitor). wore monitor... ZIO showed NSR with episodes of SVT.Marland Kitchen started on toprol XL Lab Results  Component Value Date   CHOL 103 03/27/2022   HDL 57 03/27/2022   LDLCALC 34 03/27/2022   TRIG 47 03/27/2022   CHOLHDL 1.8 03/27/2022  Using medications without problems: no side effects Muscle aches:  Diet compliance: moderate Exercise: as tolerated Other complaints:    Obstructive sleep apnea: Using CPAP, well controlled.    Peripheral edema in right lower leg given history of recurrent cellulitis.  He takes low-dose furosemide 10 mg daily to help with swelling.  Wears compression hose  Relevant past medical, surgical, family and social history reviewed and updated as indicated. Interim medical history since our last visit reviewed. Allergies and medications reviewed and updated. Outpatient Medications Prior to Visit  Medication Sig Dispense Refill   furosemide (LASIX) 20 MG tablet Take 10 mg by mouth daily.     ASPIRIN 81 PO Take by mouth daily.     carbidopa-levodopa (SINEMET IR) 25-100 MG tablet Take 1 tablet by mouth 3 (three) times daily. 270 tablet 3    cetirizine (ZYRTEC ALLERGY) 10 MG tablet Take 1 tablet (10 mg total) by mouth daily. 90 tablet 0   Coenzyme Q10 (COQ10 PO) Take by mouth. 1 per day     desonide (DESOWEN) 0.05 % cream Apply topically 2 (two) times daily. 30 g 0   fenofibrate 160 MG tablet Take 1 tablet by mouth once daily 90 tablet 3   glucosamine-chondroitin 500-400 MG tablet Take 1 tablet by mouth daily.     metoprolol succinate (TOPROL XL) 25 MG 24 hr tablet Take 1 tablet (25 mg total) by mouth daily. 90 tablet 3   Multiple Vitamins-Minerals (MULTIVITAMIN WITH MINERALS) tablet Take 1 tablet by mouth daily.     mupirocin ointment (BACTROBAN) 2 % Apply topically as needed.     pramipexole (MIRAPEX) 1 MG tablet Take 1 tablet (1 mg total) by mouth in the morning, at noon, and at bedtime. 270 tablet 3   rosuvastatin (CRESTOR) 10 MG tablet Take 1 tablet (10 mg total) by mouth daily. 90 tablet 3   furosemide (LASIX) 10 MG/ML solution Take 10 mg by mouth daily.     No facility-administered medications prior to visit.     Per HPI unless specifically indicated in ROS section below Review of Systems  Constitutional:  Negative for fatigue and fever.  HENT:  Negative for ear pain.   Eyes:  Negative for pain.  Respiratory:  Negative for cough and shortness of breath.   Cardiovascular:  Negative for chest pain, palpitations and leg swelling.  Gastrointestinal:  Negative for abdominal pain.  Genitourinary:  Negative for dysuria.  Musculoskeletal:  Negative for arthralgias.  Neurological:  Negative for syncope, light-headedness and headaches.  Psychiatric/Behavioral:  Negative for dysphoric mood.    Objective:  Pulse 71   Temp 97.9 F (36.6 C) (Oral)   Ht 6' (1.829 m)   Wt 246 lb (111.6 kg)   SpO2 98%   BMI 33.36 kg/m   Wt Readings from Last 3 Encounters:  11/21/22 246 lb (111.6 kg)  07/19/22 240 lb (108.9 kg)  07/07/22 240 lb (108.9 kg)      Physical Exam Constitutional:      Appearance: He is well-developed.      Comments: Using rolling walker  HENT:     Head: Normocephalic.     Right Ear: Hearing normal.     Left Ear: Hearing normal.     Nose: Nose normal.  Neck:     Thyroid: No thyroid mass or thyromegaly.     Vascular: No carotid bruit.     Trachea: Trachea normal.  Cardiovascular:     Rate and Rhythm: Normal rate and regular rhythm.     Pulses: Normal pulses.     Heart sounds: Heart sounds not distant. No murmur heard.    No friction rub. No gallop.     Comments: No peripheral edema Pulmonary:     Effort: Pulmonary effort is normal. No respiratory distress.     Breath sounds: Normal breath sounds.  Skin:    General: Skin is warm and dry.     Findings: No rash.  Neurological:     Cranial Nerves: Cranial nerves 2-12 are intact.     Sensory: Sensation is intact.     Motor: Motor function is intact.     Coordination: Coordination abnormal.     Gait: Gait abnormal.  Psychiatric:        Speech: Speech normal.        Behavior: Behavior normal.        Thought Content: Thought content normal.       Results for orders placed or performed in visit on 04/19/22  Comprehensive metabolic panel  Result Value Ref Range   Sodium 141 135 - 145 mEq/L   Potassium 4.1 3.5 - 5.1 mEq/L   Chloride 108 96 - 112 mEq/L   CO2 26 19 - 32 mEq/L   Glucose, Bld 91 70 - 99 mg/dL   BUN 17 6 - 23 mg/dL   Creatinine, Ser 0.80 0.40 - 1.50 mg/dL   Total Bilirubin 0.5 0.2 - 1.2 mg/dL   Alkaline Phosphatase 24 (L) 39 - 117 U/L   AST 21 0 - 37 U/L   ALT 11 0 - 53 U/L   Total Protein 6.6 6.0 - 8.3 g/dL   Albumin 4.0 3.5 - 5.2 g/dL   GFR 87.46 >60.00 mL/min   Calcium 9.6 8.4 - 10.5 mg/dL  PSA, Medicare  Result Value Ref Range   PSA 0.23 0.10 - 4.00 ng/ml     COVID 19 screen:  No recent travel or known exposure to COVID19 The patient denies respiratory symptoms of COVID 19 at this time. The importance of social distancing was discussed today.   Assessment and Plan    Problem List Items Addressed This  Visit     Aortic atherosclerosis ([place]) (Chronic)    LDL goal less than 70  given aortic atherosclerosis On Crestor 10 mg daily, fenofibrate 160 mg p.o. daily      Relevant Medications   furosemide (LASIX) 20 MG tablet   Hyperlipidemia LDL goal <70 (Chronic)    Stable, chronic.  Continue current medication.  LDL goal less than 70 given aortic atherosclerosis On Crestor 10 mg daily, fenofibrate 160 mg p.o. daily      Relevant Medications   furosemide (LASIX) 20 MG tablet   OSA (obstructive sleep apnea) (Chronic)    Using CPAP, well controlled.      Parkinson's disease - Primary (Chronic)    Followed by neurology Dr. On Sinemet 25/100 mg p.o. 3 times daily.  Doing physical therapy.      Edema of right lower leg    Peripheral edema in right lower leg given history of  injury and recurrent cellulitis.  He takes low-dose furosemide 10 mg daily to help with swelling.  Wears compression hose.      Hearing loss    Bilateral hearing aides.      Pityriasis rosea     Followed by Dermatology.  Using desonide prn.      SVT (supraventricular tachycardia)    Saw cardiology Dr. Radford Bradley in early 2023 for irregular heart rate.( PACs seen on CPAP monitor). wore monitor... ZIO showed NSR with episodes of SVT.Marland Kitchen started on toprol XL      Relevant Medications   furosemide (LASIX) 20 MG tablet     Eliezer Lofts, MD

## 2022-11-21 NOTE — Assessment & Plan Note (Signed)
Followed by Dermatology.  Using desonide prn.

## 2022-11-21 NOTE — Assessment & Plan Note (Signed)
LDL goal less than 70 given aortic atherosclerosis On Crestor 10 mg daily, fenofibrate 160 mg p.o. daily

## 2022-11-21 NOTE — Assessment & Plan Note (Signed)
Using CPAP, well controlled.

## 2022-12-05 ENCOUNTER — Ambulatory Visit: Payer: Medicare HMO | Admitting: Occupational Therapy

## 2022-12-05 ENCOUNTER — Ambulatory Visit: Payer: Medicare HMO | Attending: Family Medicine | Admitting: Physical Therapy

## 2022-12-05 ENCOUNTER — Ambulatory Visit: Payer: Medicare HMO

## 2022-12-05 DIAGNOSIS — R471 Dysarthria and anarthria: Secondary | ICD-10-CM

## 2022-12-05 DIAGNOSIS — R2681 Unsteadiness on feet: Secondary | ICD-10-CM

## 2022-12-05 DIAGNOSIS — R278 Other lack of coordination: Secondary | ICD-10-CM | POA: Insufficient documentation

## 2022-12-05 DIAGNOSIS — R293 Abnormal posture: Secondary | ICD-10-CM

## 2022-12-05 DIAGNOSIS — R2689 Other abnormalities of gait and mobility: Secondary | ICD-10-CM

## 2022-12-05 NOTE — Therapy (Signed)
[facility] [address] [place], [place], [address] Phone: [phone]   Fax:  [phone]  Patient Details  Name: [name] MRN: [id number] Date of Birth: 11-09-1946 Referring Provider:  Dr. Rexene Alberts   Encounter Date: 12/05/2022  Speech Therapy Parkinson's Disease Screen   Decibel Level today: low 70s dB  (WNL=70-72 dB) with sound level meter 30cm away from pt's masked mouth. Pt's conversational volume has remained the same since last treatment course. Pt continues HEP 2-3x/week and focuses on being intentional while speaking.  Pt does not report difficulty with cognition or swallowing, which does not warrant further evaluation  Pt does does not require speech therapy services at this time. Recommend ST screen in another 6-55month.   [name] CCC-SLP 12/05/2022, 1:07 PM  [id number] [address]Phone: [phone]  Fax:  [phone]

## 2022-12-05 NOTE — Therapy (Signed)
[facility] [address] [place], [place], [address] Phone: [phone]   Fax:  [phone]  Patient Details  Name: [name] MRN: [id number] Date of Birth: [birth year] Referring Provider:  Waunita Schooner, MD  Encounter Date: 12/05/2022  Occupational Therapy Parkinson's Disease Screen  Hand dominance:  right   Physical Performance Test item #2 (simulated eating):  14.82 sec  Fastening/unfastening 3 buttons in:  40.28sec  9-hole peg test:    RUE  28.41 sec        LUE  32.28 sec  Box & Blocks Test:   RUE  47 blocks        LUE  43 blocks  Change in ability to perform ADLs/IADLs:  no  Other Comments:  R shoulder flex 135* with -10*.  Pt does not require occupational therapy services at this time.  Recommended occupational therapy screen in   approx 6 months.  Pt with mild slowing of coordination measures, but pt denies functional change.  Discussed that if further slowing noted at next screen, OT will be recommended at that time.  Pt to request OT referral prior to screen if he is experiencing functional changes.  Pt agrees with plan.    [facility], OTR/L 12/05/2022, 8:07 AM  [facility] [address] [place], [place], [address] Phone: [phone]   Fax:  [phone]

## 2022-12-05 NOTE — Therapy (Signed)
[facility] [address] [place], [place], [address] Phone: [phone]   Fax:  [phone]  Patient Details  Name: [name] MRN: [id number] Date of Birth: Oct 21, 1946 Referring Provider:  Waunita Schooner, MD  Encounter Date: 12/05/2022  Physical Therapy Parkinson's Disease Screen   Timed Up and Go test:13.75 seconds with rollator   10 meter walk test: 13 seconds = 2.52 ft/sec   5 time sit to stand test:22.41 seconds with no UE support (previously was 15.4 seconds)  Has not been able to walk outside due to the cold weather. Still having difficulty with balance with EC. No falls. Has still been using the cane in the house, as long as he does not get in a hurry. One time he turned too quick and had to hold onto the wall. Reports exercises are still going well at home.    Patient does not require Physical Therapy services at this time. Pt has maintained stable since he was last here and has been consistently performing exercises at home. Provided info for [name] for PT services (which is close to where pt lives).  Recommend Physical Therapy screen in 6 months.     Arliss Journey, PT, DPT  12/05/2022, 2:23 PM  [facility] [address] [place], [place], [address] Phone: [phone]   Fax:  [phone]

## 2022-12-20 DIAGNOSIS — H25043 Posterior subcapsular polar age-related cataract, bilateral: Secondary | ICD-10-CM | POA: Diagnosis not present

## 2022-12-20 DIAGNOSIS — H43813 Vitreous degeneration, bilateral: Secondary | ICD-10-CM | POA: Diagnosis not present

## 2022-12-20 DIAGNOSIS — H2513 Age-related nuclear cataract, bilateral: Secondary | ICD-10-CM | POA: Diagnosis not present

## 2022-12-21 NOTE — Progress Notes (Signed)
[facility] [address] [place]. [place] [address] [phone]       OFFICE FOLLOW UP NOTE  [name] Date of Birth:  [birth year] Medical Record Number:  [id number]   Reason for visit: OSA on CPAP, Parkinson's    SUBJECTIVE:   CHIEF COMPLAINT:  Chief Complaint  Patient presents with   Follow-up    RM 3 alone Pt is well and stable, no new concerns with CPAP or Parkinsons     HPI:   Update 12/25/2022 JM: Returns for 88-monthParkinson's and CPAP follow-up visit unaccompanied.  CPAP: Compliance report over the past 30 days shows 30 out of 30 usage days and 30 days greater than 4 hours for 100% compliance.  Average usage 5 hours and 29 minutes.  Residual AHI 3.7.  Pressure in the 95th percentile 11.5 on pressure settings of 7-13.  Leaks in the 95 percentile 12.8. Does have good days and bad days with use. Will have some issues with leak when laying on back but doesn't occur as much when laying on side. Will fall asleep on his side but will wake up on his back. Otherwise tolerating CPAP well. Does continue to have some daytime fatigue.  Epworth Sleepiness Scale 6/24.  Fatigue severity scale 19/63.   Parkinson's: Has been stable.  Continues on Sinemet 1 tab 3 times daily and pramipexole 1 mg 3 times daily.  Continues to use rolling walker outside of home, will use cane indoors.  Denies any recent falls.  Has since completed therapies. [facility] is looking forward to.       History provided for reference purposes only Update 06/22/2022 JM: Patient returns for follow-up visit after prior visit with Dr. ARexene Alberts3 months ago for CPAP management and Parkinson's disease.  Accompanied by his wife, [name] In regards to CPAP, pressure adjustments made at prior visit due to elevated AHI.  Review of compliance report from the past 30 days shows 30 out of 30 usage days with 30 days greater than 4 hours for 100%  compliance.  Average usage 5 hours and 19 minutes.  Residual AHI 4.0 on AutoPap settings 7-13 with EPR level 2.  Pressure in the 95th percentile 11.  Leaks in the 95th percentile 16.6.  He is concerned that he has not been getting the reports on his phone each day. Wife notes that she will wake up and hear mask leaking, usually his machine will say if mask needs to be adjusted or not on the display but it has not been recently doing this. He was concerned that we may not be getting the reports. He has tried to reach out to DME [employer] [facility], they keep getting told they will look into this issue but nothing yet been resolved, this has been going on for other 1 month.  He otherwise is tolerating well. He does still have daytime somnolence. ESS 8/24.    In regards to Parkinson's, he has been stable.  Remains on Sinemet 25-1043m1 tab 3 times daily and pramipexole 1 mg 3 times daily.  Completed PT back in May and continue to do exercises at home.   Continued gait impairment but believes this has been stable although per wife, slightly declined in regards to not being as stable when making turns. Use of rollator walker, has difficulty ambulating with device. Questions use of cane ([employer] comfort cane). He is interested in restarting PT. Constipation persists, currently  using Miralax every other day.  Continued daytime somnolence as well as insomnia, unchanged since prior visit. Wife requesting renewal of handicap placard.     Update 03/13/2022 Dr. Rexene Alberts: I reviewed his AutoPap compliance data from 02/07/2022 through 03/08/2022, which is a total of 30 days, during which time he used his machine every night with percent use days greater than 4 hours at 97%, indicating excellent compliance with an average usage of 5 hours and 28 minutes, residual AHI elevated at 21.3/h, mostly obstructive in nature, central apnea index of 2.9/h, pressure of 5 to 11 cm with EPR of 2.  Average pressure for the 95th percentile at 10.8  cm, maximum 11 cm, leak on the higher side with the 95th percentile at 25.9 L/min.  He is still adjusting to treatment.  He reports feeling a little better, wife still is worried about his daytime somnolence.  He is using an AirTouch fullface mask but it does pinch his nasal bridge.  He is looking into getting a gecko nasal gel for from [facility].  Would be willing to increase his pressure setting.  The pressure does not bother him.  He does have dry mouth.  He uses the humidifier in the machine consistently. He reports feeling fairly stable. He finished outpt OT and ST through neuro rehab and has ongoing PT. He tries to hydrate well with water. He exercises on a regular basis and uses a stationary bike.  He has constipation and uses prunes and a laxative prn. No recent falls.     ROS:   14 system review of systems performed and negative with exception of those listed in HPI  PMH:  Past Medical History:  Diagnosis Date   Agatston coronary artery calcium score greater than 400    Coronary calcium score of 533. This was 69th percentile for age,   Aortic atherosclerosis ([place])    Hyperlipidemia LDL goal <70    Parkinson disease     PSH:  Past Surgical History:  Procedure Laterality Date   TONSILLECTOMY      Social History:  Social History   Socioeconomic History   Marital status: Married    Spouse name: [name]   Number of children: 1   Years of education: master's degree   Highest education level: Not on file  Occupational History   Not on file  Tobacco Use   Smoking status: Some Days    Types: Pipe   Smokeless tobacco: Never   Tobacco comments:    EVERY 1-2 MONTHS   Vaping Use   Vaping Use: Never used  Substance and Sexual Activity   Alcohol use: Yes    Alcohol/week: 7.0 standard drinks of alcohol    Types: 7 Cans of beer per week   Drug use: Never   Sexual activity: Yes  Other Topics Concern   Not on file  Social History Narrative   11/25/20   From: [place], but  from this area, moved back 2022   Living: with wife, [name] (1980)   Work: retired Copywriter, advertising for [employer], played piano      Family: Cristen in [place], 2 granddaughters       Enjoys: piano, read, crosswords      Exercise: stationary bike 40 min daily, PT for balance   Diet: good, yogurt, cereal, big meal at lunchtime, light dinner      Safety   Seat belts: Yes    Guns: no   Safe in relationships: Yes  Social Determinants of Health   Financial Resource Strain: Not on file  Food Insecurity: Not on file  Transportation Needs: Not on file  Physical Activity: Not on file  Stress: Not on file  Social Connections: Not on file  Intimate Partner Violence: Not on file    Family History:  Family History  Problem Relation Age of Onset   Arthritis Mother    Hearing loss Mother    Hypertension Mother    Prostate cancer Father    Hearing loss Father    Heart attack Father 58   Hyperlipidemia Father    Hypertension Sister    Diabetes Daughter    Heart disease Maternal Grandmother    Hypertension Maternal Grandmother    Arthritis Maternal Grandmother    Sleep apnea Neg Hx     Medications:   Current Outpatient Medications on File Prior to Visit  Medication Sig Dispense Refill   ASPIRIN 81 PO Take by mouth daily.     carbidopa-levodopa (SINEMET IR) 25-100 MG tablet Take 1 tablet by mouth 3 (three) times daily. 270 tablet 3   cetirizine (ZYRTEC ALLERGY) 10 MG tablet Take 1 tablet (10 mg total) by mouth daily. 90 tablet 3   Coenzyme Q10 (COQ10 PO) Take by mouth. 1 per day     desonide (DESOWEN) 0.05 % cream Apply topically 2 (two) times daily. 30 g 0   fenofibrate 160 MG tablet Take 1 tablet by mouth once daily 90 tablet 3   furosemide (LASIX) 20 MG tablet Take 0.5 tablets (10 mg total) by mouth daily. 45 tablet 3   glucosamine-chondroitin 500-400 MG tablet Take 1 tablet by mouth daily.     ketoconazole (NIZORAL) 2 % shampoo Apply 1 Application  topically 2 (two) times a week.     metoprolol succinate (TOPROL XL) 25 MG 24 hr tablet Take 1 tablet (25 mg total) by mouth daily. 90 tablet 3   Multiple Vitamins-Minerals (MULTIVITAMIN WITH MINERALS) tablet Take 1 tablet by mouth daily.     mupirocin ointment (BACTROBAN) 2 % Apply topically as needed.     nystatin cream (MYCOSTATIN) Apply 1 Application topically 2 (two) times daily.     pramipexole (MIRAPEX) 1 MG tablet Take 1 tablet (1 mg total) by mouth in the morning, at noon, and at bedtime. 270 tablet 3   rosuvastatin (CRESTOR) 10 MG tablet Take 1 tablet (10 mg total) by mouth daily. 90 tablet 3   No current facility-administered medications on file prior to visit.    Allergies:  No Known Allergies    OBJECTIVE:  Physical Exam  Vitals:   12/25/22 1406  BP: 119/63  Pulse: 74  Weight: 246 lb (111.6 kg)  Height: 6' 1"$  (1.854 m)    Body mass index is 32.46 kg/m. No results found.   General: well developed, well nourished, very pleasant elderly Caucasian male,  seated, in no evident distress HEENT: head normocephalic and atraumatic.  Moderate facial masking Cardiovascular: regular rate and rhythm, no murmurs Musculoskeletal: no deformity Skin:  no rash/petichiae Vascular:  Normal pulses all extremities   Neurologic Exam Mental Status: Awake and fully alert.  Moderate hypophonia, no significant dysarthria.  Oriented to place and time. Recent and remote memory intact. Attention span, concentration and fund of knowledge appropriate. Mood and affect appropriate.  Cranial Nerves: Pupils equal, briskly reactive to light. Extraocular movements full without nystagmus. Visual fields full to confrontation. Hearing intact. Facial sensation intact. Face, tongue, palate moves normally and symmetrically.  Motor: Normal strength  in all tested extremity muscles. Mild increased tone LUE, unable to appreciate evidence of resting or action tremor on exam today. Mild cogwheel rigidity  bilaterally.  Sensory.: intact to touch , pinprick , position and vibratory sensation.  Coordination: Rapid alternating movements mildly impaired L>R. Finger-to-nose and heel-to-shin performed accurately bilaterally. Gait and Station: Arises from chair without difficulty. Stance is hunched. Gait demonstrates normal stride length with use of rollator walker.  Reflexes: 1+ and symmetric. Toes downgoing.         ASSESSMENT/PLAN: [name] is a 77 y.o. year old male      OSA on CPAP : Compliance report shows satisfactory usage with optimal residual AHI.  Continue current pressure settings.  Discussed ways to help avoid supine sleep.  Discussed continued nightly usage with ensuring greater than 4 hours nightly for optimal benefit and per insurance purposes.  Continue to follow with DME company for any needed supplies or CPAP related concerns  Parkinson's disease:  Continue sinemet 1 tab 3 times daily Continue pramipexole 1 tab 3 times daily Refills provided Use of AD at all times for fall prevention Use of miralax as needed to help with constipation     Follow up in  1 year or call earlier if needed   CC:  PCP: Waunita Schooner, MD    I spent 31 minutes of face-to-face and non-face-to-face time with patient.  This included previsit chart review, lab review, study review, order entry, electronic health record documentation, patient education and discussion regarding above diagnoses and treatment plan and answered all questions to patient's satisfaction   Frann Rider, [facility]  [facility] [address] [place], [place] [address]  Phone [phone] Fax [phone] Note: This document was prepared with digital dictation and possible smart phrase technology. Any transcriptional errors that result from this process are unintentional.

## 2022-12-25 ENCOUNTER — Ambulatory Visit: Payer: Medicare HMO | Admitting: Adult Health

## 2022-12-25 ENCOUNTER — Encounter: Payer: Self-pay | Admitting: Adult Health

## 2022-12-25 VITALS — BP 119/63 | HR 74 | Ht 73.0 in | Wt 246.0 lb

## 2022-12-25 DIAGNOSIS — G20A1 Parkinson's disease without dyskinesia, without mention of fluctuations: Secondary | ICD-10-CM

## 2022-12-25 DIAGNOSIS — G4733 Obstructive sleep apnea (adult) (pediatric): Secondary | ICD-10-CM

## 2022-12-25 MED ORDER — CARBIDOPA-LEVODOPA 25-100 MG PO TABS: 1.0000 | ORAL_TABLET | Freq: Three times a day (TID) | ORAL | 3 refills | Status: DC

## 2022-12-25 MED ORDER — PRAMIPEXOLE DIHYDROCHLORIDE 1 MG PO TABS: 1.0000 mg | ORAL_TABLET | Freq: Three times a day (TID) | ORAL | 3 refills | Status: DC

## 2022-12-25 NOTE — Patient Instructions (Addendum)
Your Plan:  Continue nightly use of CPAP for adequate sleep apnea management Continue to follow with your DME company for any needed supplies or CPAP related concerns  Continue Sinemet 1 tab 3 times daily and pramipexole 1 mg 3 times daily - refills provided    Follow-up in 1 year or call earlier if needed     Thank you for coming to see [place] at [facility]. I hope we have been able to provide you high quality care today.  You may receive a patient satisfaction survey over the next few weeks. We would appreciate your feedback and comments so that we may continue to improve ourselves and the health of our patients.

## 2022-12-31 ENCOUNTER — Ambulatory Visit
Admission: EM | Admit: 2022-12-31 | Discharge: 2022-12-31 | Disposition: A | Discharge status: Home / Self Care | Payer: Medicare HMO | Attending: Urgent Care | Admitting: Urgent Care

## 2022-12-31 DIAGNOSIS — B372 Candidiasis of skin and nail: Secondary | ICD-10-CM | POA: Diagnosis not present

## 2022-12-31 MED ORDER — KETOCONAZOLE 2 % EX CREA: 1.0000 | TOPICAL_CREAM | Freq: Every day | CUTANEOUS | 0 refills | Status: AC

## 2022-12-31 MED ORDER — NYSTATIN 100000 UNIT/GM EX POWD: 1.0000 | Freq: Three times a day (TID) | CUTANEOUS | 0 refills | Status: AC

## 2022-12-31 NOTE — Discharge Instructions (Addendum)
Wear cotton (breathable) undergarments that are absorbent.  Twice a day:  - Wash area gently with soap and water - Gently pat dry - Allow area to air dry completely for a few minutes - Apply a minimal amount of antifungal cream (ketoconazole) and rub in completely - Again, allow area to air dry completely for a few minutes - Apply medicated powder (gold bond) before dressing  Apply additional medicated or antifungal powder throughout the day to ensure dryness.  You may also apply hydrocortisone cream to the affected area prior to applying powder. Again, rub in completely and allow to dry.

## 2022-12-31 NOTE — ED Triage Notes (Addendum)
Patient presents to UC for rash on RUE since yesterday. States increased swelling and redness. Hx of staph infections. Treating rash with kenalog ointment.

## 2022-12-31 NOTE — ED Provider Notes (Addendum)
Roderic Palau    CSN: [id number] Arrival date & time: 12/31/22  [id number]      History   Chief Complaint Chief Complaint  Patient presents with   Rash    HPI [name] is a 77 y.o. male.    Rash   Patient presents to urgent care for "rash" on right lower extremity since yesterday.  Patient endorses history of staph infections and has been treating with Kenalog ointment.  He reports increased swelling and redness to the affected area.  Past Medical History:  Diagnosis Date   Agatston coronary artery calcium score greater than 400    Coronary calcium score of 533. 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 Authorizing Provider  ASPIRIN 81 PO Take by mouth daily.    [provider]  carbidopa-levodopa (SINEMET IR) 25-100 MG tablet Take 1 tablet by mouth 3 (three) times daily. 12/25/22   Frann Rider, NP  cetirizine (ZYRTEC ALLERGY) 10 MG tablet Take 1 tablet (10 mg total) by mouth daily. 11/21/22   Bedsole, Amy E, MD  Coenzyme Q10 (COQ10 PO) Take by mouth. 1 per day    [provider]  desonide (DESOWEN) 0.05 % cream Apply topically 2 (two) times daily. 11/25/20    Waunita Schooner, MD  fenofibrate 160 MG tablet Take 1 tablet by mouth once daily 05/08/22   Waunita Schooner, MD  furosemide (LASIX) 20 MG tablet Take 0.5 tablets (10 mg total) by mouth daily. 11/21/22   Bedsole, Amy E, MD  glucosamine-chondroitin 500-400 MG tablet Take 1 tablet by mouth daily.    [provider]  ketoconazole (NIZORAL) 2 % shampoo Apply 1 Application topically 2 (two) times a week. 09/06/22   [provider]  metoprolol succinate (TOPROL XL) 25 MG 24 hr tablet Take 1 tablet (25 mg total) by mouth daily. 02/23/22   Sueanne Margarita, MD  Multiple Vitamins-Minerals (MULTIVITAMIN WITH MINERALS) tablet Take 1 tablet by mouth daily.    [provider]  mupirocin ointment (BACTROBAN) 2 % Apply topically as needed. 07/14/20   [provider]  nystatin cream (MYCOSTATIN) Apply 1 Application topically 2 (two) times daily. 08/10/22   [provider]  pramipexole (MIRAPEX) 1 MG tablet Take 1 tablet (1 mg total) by mouth in the morning, at noon, and at bedtime. 12/25/22   Frann Rider, NP  rosuvastatin (CRESTOR) 10 MG tablet Take 1 tablet (10 mg total) by mouth daily. 02/07/22   Sueanne Margarita, MD    Family History Family History  Problem Relation Age of Onset   Arthritis Mother    Hearing loss Mother    Hypertension Mother  Prostate cancer Father    Hearing loss Father    Heart attack Father 41   Hyperlipidemia Father    Hypertension Sister    Diabetes Daughter    Heart disease Maternal Grandmother    Hypertension Maternal Grandmother    Arthritis Maternal Grandmother    Sleep apnea Neg Hx     Social History Social History   Tobacco Use   Smoking status: Some Days    Types: Pipe   Smokeless tobacco: Never   Tobacco comments:    EVERY 1-2 MONTHS   Vaping Use   Vaping Use: Never used  Substance Use Topics   Alcohol use: Yes    Alcohol/week: 7.0 standard drinks of alcohol    Types: 7 Cans of beer per week   Drug use: Never      Allergies   Patient has no known allergies.   Review of Systems Review of Systems  Skin:  Positive for rash.     Physical Exam Triage Vital Signs ED Triage Vitals [12/31/22 0816]  Enc Vitals Group     BP      Pulse      Resp      Temp      Temp src      SpO2      Weight      Height      Head Circumference      Peak Flow      Pain Score 0     Pain Loc      Pain Edu?      Excl. in [place]?    No data found.  Updated Vital Signs There were no vitals taken for this visit.  Visual Acuity Right Eye Distance:   Left Eye Distance:   Bilateral Distance:    Right Eye Near:   Left Eye Near:    Bilateral Near:     Physical Exam Skin:          UC Treatments / Results  Labs (all labs ordered are listed, but only abnormal results are displayed) Labs Reviewed - No data to display  EKG   Radiology No results found.  Procedures Procedures (including critical care time)  Medications Ordered in UC Medications - No data to display  Initial Impression / Assessment and Plan / UC Course  I have reviewed the triage vital signs and the nursing notes.  Pertinent labs & imaging results that were available during my care of the patient were reviewed by me and considered in my medical decision making (see chart for details).   Intertrigo candida present in R groin area. Recommending antifungal cream and attentive washing, drying regimen to keep area dry. See patient instructions.   Final Clinical Impressions(s) / UC Diagnoses   Final diagnoses:  None   Discharge Instructions   None    ED Prescriptions   None    PDMP not reviewed this encounter.   Rose Phi, [place] 12/31/22 0844    Rose Phi, [place] 12/31/22 1021

## 2023-01-19 DIAGNOSIS — L718 Other rosacea: Secondary | ICD-10-CM | POA: Diagnosis not present

## 2023-01-19 DIAGNOSIS — L309 Dermatitis, unspecified: Secondary | ICD-10-CM | POA: Diagnosis not present

## 2023-02-04 ENCOUNTER — Other Ambulatory Visit: Payer: Self-pay | Admitting: Cardiology

## 2023-02-17 IMAGING — CT CT CARDIAC CORONARY ARTERY CALCIUM SCORE
3 series · 14 of 20 positions shown, 16 images · non-contrast
Comparison: None.
COMPARISON: None.

Addendum:
EXAM:
OVER-READ INTERPRETATION  CT CHEST

The following report is an over-read performed by radiologist Dr.
Alieth Tmo [REDACTED] on 02/01/2022. This
over-read does not include interpretation of cardiac or coronary
anatomy or pathology. The coronary calcium score interpretation by
the cardiologist is attached.
CLINICAL DATA: Risk stratification: 75 Year-old White Male
Coronary Calcium Score
TECHNIQUE: The patient was scanned on a Siemens Force scanner. Axial
non-contrast 3 mm slices were carried out through the heart. The
data set was analyzed on a dedicated work station and scored using
the Agatson method.

[Series 2: cascseq 2.0 sa36 70% (id) · axial · 0.45mm/px · z∈[-233,-153]mm · 4 of 68 slices shown]
[im 14/68  vessel]
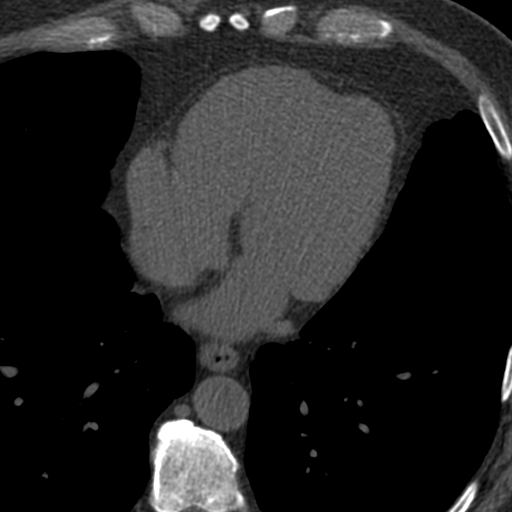
[im 27/68  vessel]
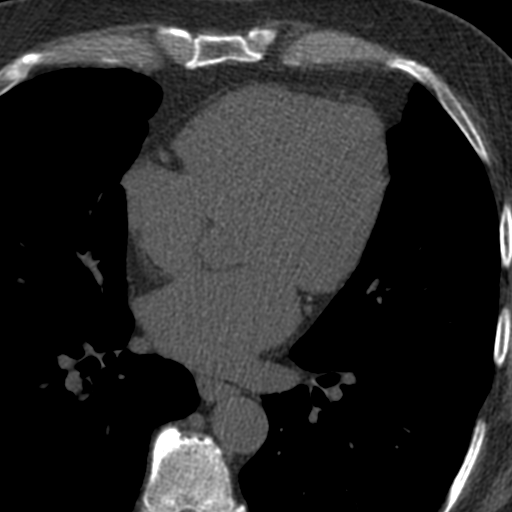
[im 41/68  vessel]
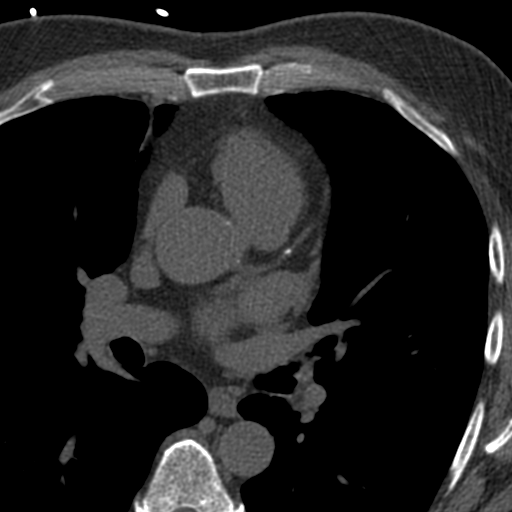
[im 54/68  vessel]
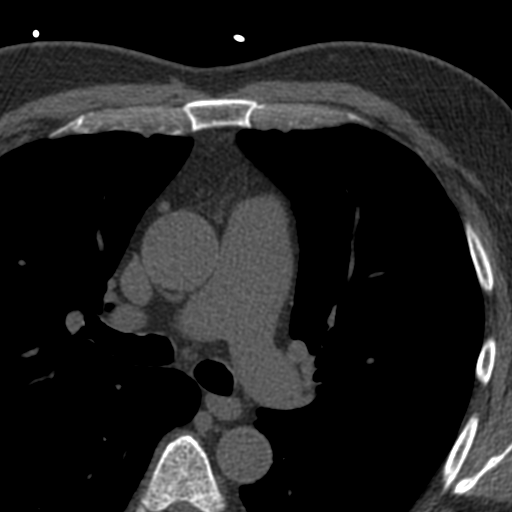

[Series 3: cascseq 2.0 bf37 st · axial · 0.82mm/px · z∈[-237,-149]mm · 5 of 68 slices shown, 7 images]
[im 12/68  vessel]
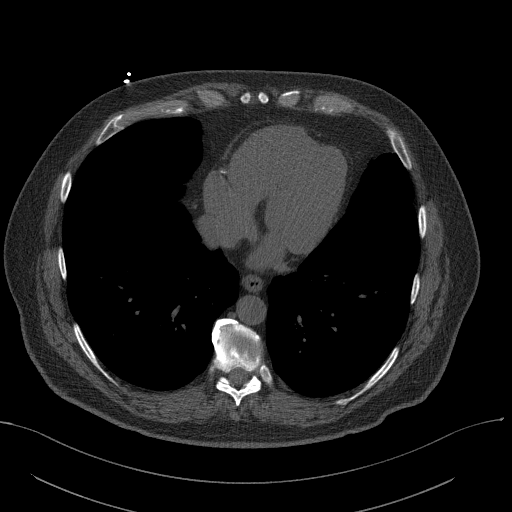
[im 12/68  lung]
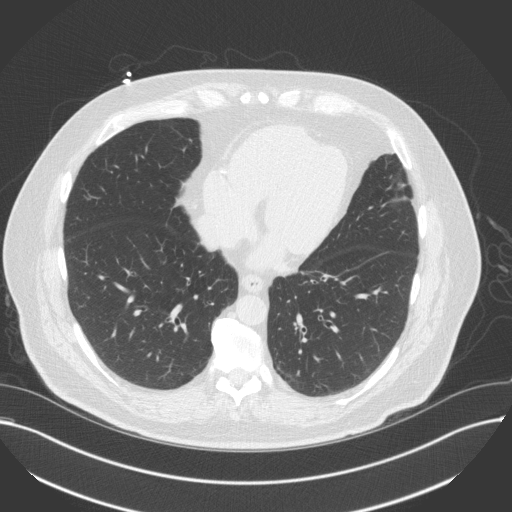
[im 23/68  vessel]
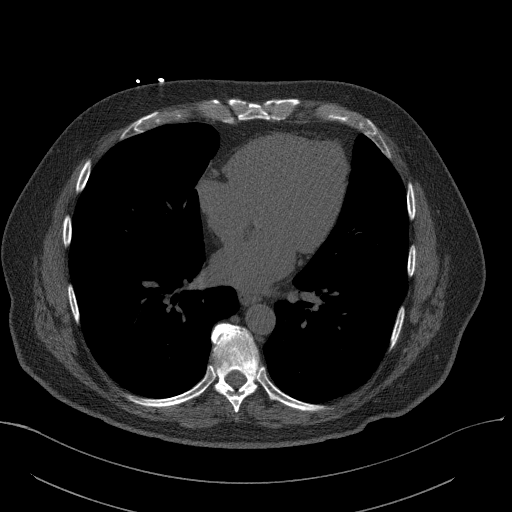
[im 34/68  vessel]
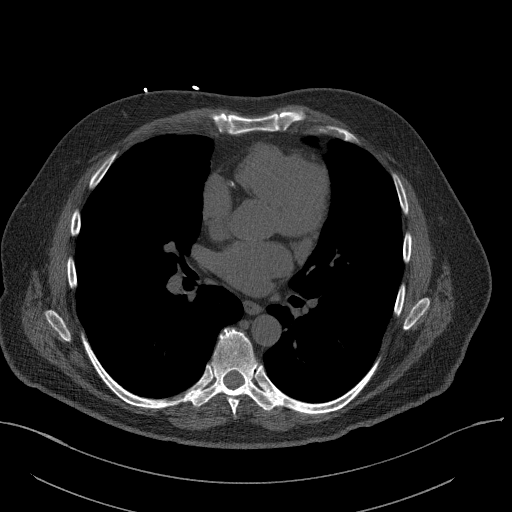
[im 45/68  vessel]
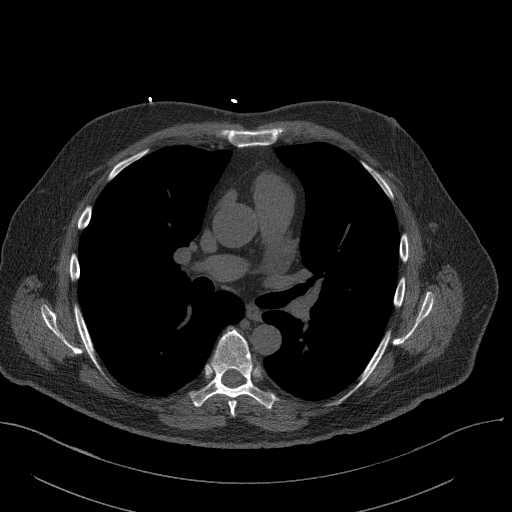
[im 56/68  vessel]
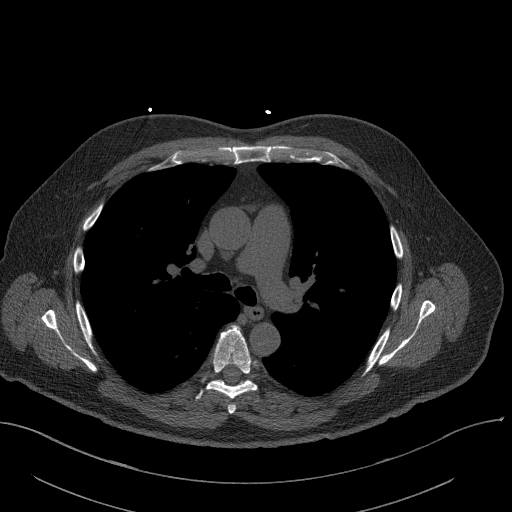
[im 56/68  lung]
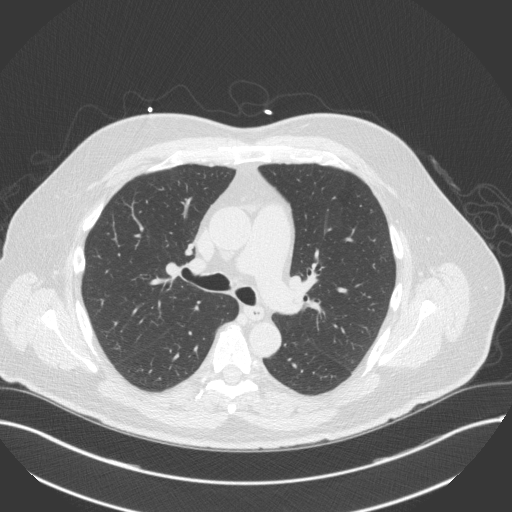

[Series 4: cascseq 2.0 br59 lung · axial · 0.82mm/px · z∈[-237,-149]mm · 5 of 68 slices shown]
[im 12/68  lung]
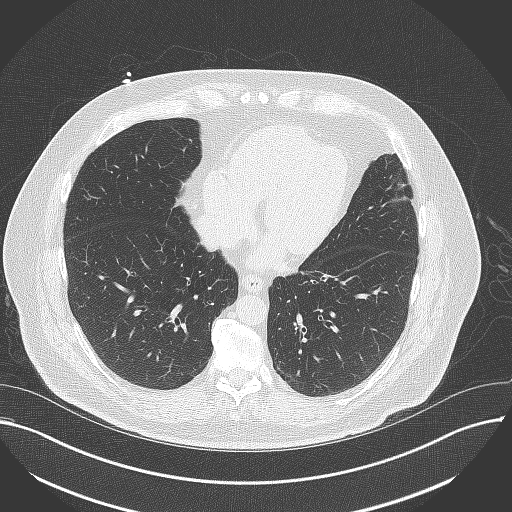
[im 23/68  lung]
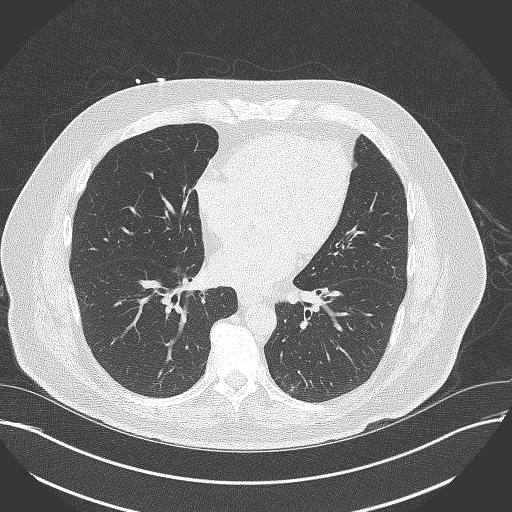
[im 34/68  lung]
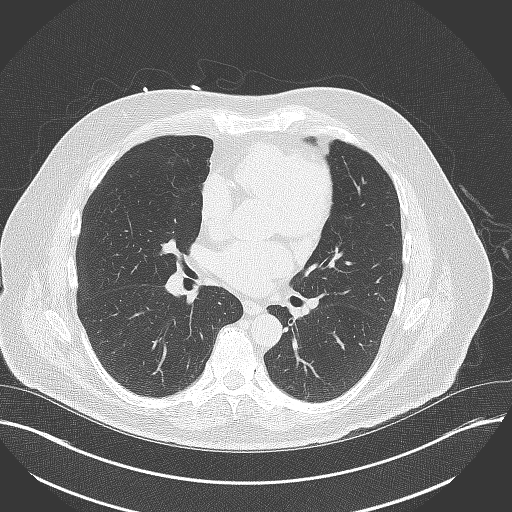
[im 45/68  lung]
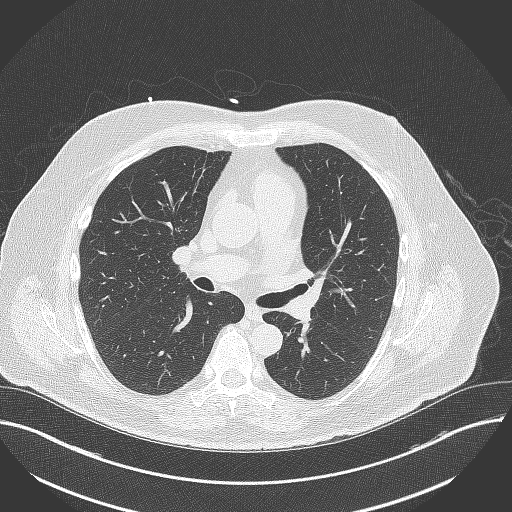
[im 56/68  lung]
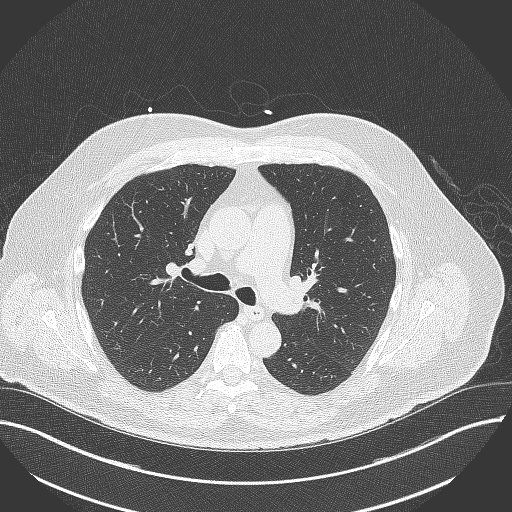

[14 of 20 positions shown; findings below may reference images not displayed]

FINDINGS: Atherosclerotic calcifications in the thoracic aorta. Within the
visualized portions of the thorax there are no suspicious appearing
pulmonary nodules or masses, there is no acute consolidative
airspace disease, no pleural effusions, no pneumothorax and no
lymphadenopathy. Visualized portions of the upper abdomen are
unremarkable. There are no aggressive appearing lytic or blastic
lesions noted in the visualized portions of the skeleton.
IMPRESSION: 1.  Aortic Atherosclerosis (ALH46-DRW.W).
FINDINGS: Non-cardiac: See separate report from [REDACTED].

Ascending Aorta: Normal caliber.  Aortic atherosclerosis.

Pericardium: Normal.

Coronary arteries: Normal origins.

Coronary Calcium Score:

Left main: 1

Left anterior descending artery: 13

Left circumflex artery: 18

Right coronary artery: 501

Total: 533

Percentile: 69th for age, sex, and race matched control.
IMPRESSION: 1. Coronary calcium score of 533. This was 69th percentile for age,
gender, and race matched controls.

2.  Aortic atherosclerosis.

RECOMMENDATIONS:



If CAC = 0, it is reasonable to withhold statin therapy and reassess
in 5 to 10 years, as long as higher risk conditions are absent
(diabetes mellitus, family history of premature CHD in first degree
relatives (males <55 years; females <65 years), cigarette smoking,
LDL >=190 mg/dL or other independent risk factors).

If CAC is 1 to 99, it is reasonable to initiate statin therapy for
patients >=55 years of age.

If CAC is >=100 or >=75th percentile, it is reasonable to initiate
statin therapy at any age.

Cardiology referral should be considered for patients with CAC
scores =400 or >=75th percentile.

*8367 AHA/ACC/AACVPR/AAPA/ABC/CECA/ALBRECHT/CAPLAN/[place]/ALLAN GOABAONE/WELL/ELI
Guideline on the Management of Blood Cholesterol: A Report of the
American College of Cardiology/American Heart Association Task Force
on Clinical Practice Guidelines. J Am Coll Cardiol.
8090;73(24):8922-8742.

*** End of Addendum ***
EXAM:
OVER-READ INTERPRETATION  CT CHEST

The following report is an over-read performed by radiologist Dr.
Alieth Tmo [REDACTED] on 02/01/2022. This
over-read does not include interpretation of cardiac or coronary
anatomy or pathology. The coronary calcium score interpretation by
the cardiologist is attached.
FINDINGS: Atherosclerotic calcifications in the thoracic aorta. Within the
visualized portions of the thorax there are no suspicious appearing
pulmonary nodules or masses, there is no acute consolidative
airspace disease, no pleural effusions, no pneumothorax and no
lymphadenopathy. Visualized portions of the upper abdomen are
unremarkable. There are no aggressive appearing lytic or blastic
lesions noted in the visualized portions of the skeleton.
IMPRESSION: 1.  Aortic Atherosclerosis (ALH46-DRW.W).

## 2023-03-01 DIAGNOSIS — L718 Other rosacea: Secondary | ICD-10-CM | POA: Diagnosis not present

## 2023-03-01 DIAGNOSIS — L218 Other seborrheic dermatitis: Secondary | ICD-10-CM | POA: Diagnosis not present

## 2023-03-01 DIAGNOSIS — L309 Dermatitis, unspecified: Secondary | ICD-10-CM | POA: Diagnosis not present

## 2023-04-03 ENCOUNTER — Telehealth: Payer: Self-pay | Admitting: *Deleted

## 2023-04-03 DIAGNOSIS — E785 Hyperlipidemia, unspecified: Secondary | ICD-10-CM

## 2023-04-03 DIAGNOSIS — Z125 Encounter for screening for malignant neoplasm of prostate: Secondary | ICD-10-CM

## 2023-04-03 DIAGNOSIS — Z1159 Encounter for screening for other viral diseases: Secondary | ICD-10-CM

## 2023-04-03 NOTE — Telephone Encounter (Signed)
-----   Message from Alvina Chou sent at 04/02/2023  2:48 PM EDT ----- Regarding: lab orders for Wednesday, 6.5.24 Patient is scheduled for CPX labs, please order future labs, Thanks , Camelia Eng

## 2023-04-18 ENCOUNTER — Other Ambulatory Visit (INDEPENDENT_AMBULATORY_CARE_PROVIDER_SITE_OTHER): Payer: Medicare HMO

## 2023-04-18 DIAGNOSIS — Z1159 Encounter for screening for other viral diseases: Secondary | ICD-10-CM

## 2023-04-18 DIAGNOSIS — Z125 Encounter for screening for malignant neoplasm of prostate: Secondary | ICD-10-CM

## 2023-04-18 DIAGNOSIS — E785 Hyperlipidemia, unspecified: Secondary | ICD-10-CM

## 2023-04-18 LAB — COMPREHENSIVE METABOLIC PANEL
ALT: 7 U/L (ref 0–53)
AST: 25 U/L (ref 0–37)
Albumin: 4.2 g/dL (ref 3.5–5.2)
Alkaline Phosphatase: 22 U/L — ABNORMAL LOW (ref 39–117)
BUN: 17 mg/dL (ref 6–23)
CO2: 22 mEq/L (ref 19–32)
Calcium: 9.2 mg/dL (ref 8.4–10.5)
Chloride: 107 mEq/L (ref 96–112)
Creatinine, Ser: 0.89 mg/dL (ref 0.40–1.50)
GFR: 84.1 mL/min (ref >60.00)
Glucose, Bld: 96 mg/dL (ref 70–99)
Potassium: 3.9 mEq/L (ref 3.5–5.1)
Sodium: 142 mEq/L (ref 135–145)
Total Bilirubin: 0.6 mg/dL (ref 0.2–1.2)
Total Protein: 6.8 g/dL (ref 6.0–8.3)

## 2023-04-18 LAB — LIPID PANEL
Cholesterol: 92 mg/dL (ref 0–200)
HDL: 45.2 mg/dL (ref >39.00)
LDL Cholesterol: 35 mg/dL (ref 0–99)
NonHDL: 47.05
Total CHOL/HDL Ratio: 2
Triglycerides: 61 mg/dL (ref 0.0–149.0)
VLDL: 12.2 mg/dL (ref 0.0–40.0)

## 2023-04-18 LAB — PSA, MEDICARE: PSA: 0.22 ng/ml (ref 0.10–4.00)

## 2023-04-18 NOTE — Progress Notes (Signed)
No critical labs need to be addressed urgently. We will discuss labs in detail at upcoming office visit.   

## 2023-04-19 LAB — HEPATITIS C ANTIBODY: Hepatitis C Ab: NONREACTIVE

## 2023-04-19 NOTE — Progress Notes (Signed)
No critical labs need to be addressed urgently. We will discuss labs in detail at upcoming office visit.   

## 2023-04-23 ENCOUNTER — Ambulatory Visit (INDEPENDENT_AMBULATORY_CARE_PROVIDER_SITE_OTHER): Payer: Medicare HMO

## 2023-04-23 VITALS — Ht 73.0 in | Wt 240.0 lb

## 2023-04-23 DIAGNOSIS — Z1211 Encounter for screening for malignant neoplasm of colon: Secondary | ICD-10-CM

## 2023-04-23 DIAGNOSIS — Z0000 Encounter for general adult medical examination without abnormal findings: Secondary | ICD-10-CM | POA: Diagnosis not present

## 2023-04-23 NOTE — Progress Notes (Signed)
I connected with  Delight Ovens on 04/23/23 by a audio enabled telemedicine application and verified that I am speaking with the correct person using two identifiers.  Patient Location: Home  Provider Location: Home Office  I discussed the limitations of evaluation and management by telemedicine. The patient expressed understanding and agreed to proceed.  Subjective:   [name] is a 77 y.o. male who presents for an Initial Medicare Annual Wellness Visit.  Review of Systems      Cardiac Risk Factors include: advanced age (>81men, >67 women);male gender     Objective:    Today's Vitals   04/23/23 1523  Weight: 240 lb (108.9 kg)  Height: 6\' 1"  (1.854 m)   Body mass index is 31.66 kg/m.     04/23/2023    3:36 PM 04/19/2022    8:59 AM 01/05/2022   11:06 AM  Advanced Directives  Does Patient Have a Medical Advance Directive? Yes No No  Type of Estate agent of [place];Living will    Copy of Healthcare Power of Attorney in Chart? No - copy requested    Would patient like information on creating a medical advance directive?  Yes (MAU/Ambulatory/Procedural Areas - Information given) Yes (MAU/Ambulatory/Procedural Areas - Information given)    Current Medications (verified) Outpatient Encounter Medications as of 04/23/2023  Medication Sig   ASPIRIN 81 PO Take by mouth daily.   carbidopa-levodopa (SINEMET IR) 25-100 MG tablet Take 1 tablet by mouth 3 (three) times daily.   cetirizine (ZYRTEC ALLERGY) 10 MG tablet Take 1 tablet (10 mg total) by mouth daily.   Coenzyme Q10 (COQ10 PO) Take by mouth. 1 per day   fenofibrate 160 MG tablet Take 1 tablet by mouth once daily   furosemide (LASIX) 20 MG tablet Take 0.5 tablets (10 mg total) by mouth daily.   glucosamine-chondroitin 500-400 MG tablet Take 1 tablet by mouth daily.   ketoconazole (NIZORAL) 2 % cream Apply 1 Application topically daily.   ketoconazole (NIZORAL) 2 % shampoo Apply 1 Application topically  2 (two) times a week.   metoprolol succinate (TOPROL-XL) 25 MG 24 hr tablet Take 1 tablet by mouth once daily   Multiple Vitamins-Minerals (MULTIVITAMIN WITH MINERALS) tablet Take 1 tablet by mouth daily.   mupirocin ointment (BACTROBAN) 2 % Apply topically as needed.   nystatin (MYCOSTATIN/NYSTOP) powder Apply 1 Application topically 3 (three) times daily.   pramipexole (MIRAPEX) 1 MG tablet Take 1 tablet (1 mg total) by mouth in the morning, at noon, and at bedtime.   rosuvastatin (CRESTOR) 10 MG tablet Take 1 tablet by mouth once daily   desonide (DESOWEN) 0.05 % cream Apply topically 2 (two) times daily. (Patient not taking: Reported on 04/23/2023)   No facility-administered encounter medications on file as of 04/23/2023.    Allergies (verified) Patient has no known allergies.   History: Past Medical History:  Diagnosis Date   Agatston coronary artery calcium score greater than 400    Coronary calcium score of 533. This was 69th percentile for age,   Aortic atherosclerosis ([facility])    Hyperlipidemia LDL goal <70    Parkinson disease    Past Surgical History:  Procedure Laterality Date   TONSILLECTOMY     Family History  Problem Relation Age of Onset   Arthritis Mother    Hearing loss Mother    Hypertension Mother    Prostate cancer Father    Hearing loss Father    Heart attack Father 83   Hyperlipidemia  Father    Hypertension Sister    Diabetes Daughter    Heart disease Maternal Grandmother    Hypertension Maternal Grandmother    Arthritis Maternal Grandmother    Sleep apnea Neg Hx    Social History   Socioeconomic History   Marital status: Married    Spouse name: [name] Jester   Number of children: 1   Years of education: master's degree   Highest education level: Not on file  Occupational History   Not on file  Tobacco Use   Smoking status: Some Days    Types: Pipe   Smokeless tobacco: Never   Tobacco comments:    EVERY 1-2 MONTHS   Vaping Use   Vaping Use:  Never used  Substance and Sexual Activity   Alcohol use: Yes    Alcohol/week: 7.0 standard drinks of alcohol    Types: 7 Cans of beer per week   Drug use: Never   Sexual activity: Yes  Other Topics Concern   Not on file  Social History Narrative   11/25/20   From: [place], but from this area, moved back 2022   Living: with wife, [name] Jester [phone])   Work: retired Education officer, 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, played piano      Family: Cristen in [place], 2 granddaughters       Enjoys: piano, read, crosswords      Exercise: stationary bike 40 min daily, PT for balance   Diet: good, yogurt, cereal, big meal at lunchtime, light dinner      Safety   Seat belts: Yes    Guns: no   Safe in relationships: Yes    Social Determinants of Health   Financial Resource Strain: Low Risk  (04/23/2023)   Overall Financial Resource Strain (CARDIA)    Difficulty of Paying Living Expenses: Not hard at all  Food Insecurity: No Food Insecurity (04/23/2023)   Hunger Vital Sign    Worried About Running Out of Food in the Last Year: Never true    Ran Out of Food in the Last Year: Never true  Transportation Needs: No Transportation Needs (04/23/2023)   PRAPARE - Administrator, Civil Service (Medical): No    Lack of Transportation (Non-Medical): No  Physical Activity: Sufficiently Active (04/23/2023)   Exercise Vital Sign    Days of Exercise per Week: 7 days    Minutes of Exercise per Session: 50 min  Stress: No Stress Concern Present (04/23/2023)   [employer] of Occupational Health - Occupational Stress Questionnaire    Feeling of Stress : Not at all  Social Connections: Moderately Isolated (04/23/2023)   Social Connection and Isolation Panel [NHANES]    Frequency of Communication with Friends and Family: More than three times a week    Frequency of Social Gatherings with Friends and Family: More than three times a week    Attends Religious Services: Never    Loss adjuster, chartered or Organizations: No    Attends Engineer, structural: Never    Marital Status: Married    Tobacco Counseling Ready to quit: Not Answered Counseling given: Not Answered Tobacco comments: EVERY 1-2 MONTHS    Clinical Intake:  Pre-visit preparation completed: Yes  Pain : No/denies pain     Nutritional Risks: None Diabetes: No  How often do you need to have someone help you when you read instructions, pamphlets, or other written materials from your doctor or pharmacy?: 1 - Never  Diabetic? no  Interpreter Needed?: No  Information entered by :: C.Presly Steinruck LPN   Activities of Daily Living    04/23/2023    3:37 PM 04/22/2023    8:02 AM  In your present state of health, do you have any difficulty performing the following activities:  Hearing? 0 1  Vision? 0 0  Difficulty concentrating or making decisions? 0 0  Walking or climbing stairs? 1 1  Comment somewhat difficult   Dressing or bathing? 0 0  Doing errands, shopping? 0 0  Preparing Food and eating ? N N  Using the Toilet? N N  In the past six months, have you accidently leaked urine? Y Y  Comment occasionally   Do you have problems with loss of bowel control? N N  Managing your Medications? N N  Managing your Finances? N N  Housekeeping or managing your Housekeeping? N N    Patient Care Team: Gweneth Dimitri, MD as PCP - General (Family Medicine) Quintella Reichert, MD as PCP - Cardiology (Cardiology) Huston Foley, MD as Attending Physician (Neurology) Pollyann Glen, MD as Referring Physician (Dermatology)  Indicate any recent Medical Services you may have received from other than [facility] providers in the past year (date may be approximate).     Assessment:   This is a routine wellness examination for [place].  Hearing/Vision screen Hearing Screening - Comments:: Aids Vision Screening - Comments:: Glasses - Dr.Tanner  Dietary issues and exercise activities discussed: Current Exercise Habits:  Home exercise routine, Type of exercise: strength training/weights (Bike), Time (Minutes): 50, Frequency (Times/Week): 7, Weekly Exercise (Minutes/Week): 350, Intensity: Moderate, Exercise limited by: None identified   Goals Addressed             This Visit's Progress    Patient Stated       Lose weight and walk better.       Depression Screen    04/23/2023    3:35 PM 04/19/2022    9:32 AM 04/19/2022    9:00 AM 11/25/2020   10:42 AM  PHQ 2/9 Scores  PHQ - 2 Score 0 0 0 0    Fall Risk    04/23/2023    3:29 PM 04/22/2023    8:02 AM 01/12/2022    9:56 AM 11/25/2020   10:02 AM  Fall Risk   Falls in the past year? 0 0 0 0  Number falls in past yr: 0 0 0 0  Injury with Fall? 0 0    Risk for fall due to : No Fall Risks  Impaired balance/gait   Follow up Falls prevention discussed;Falls evaluation completed       FALL RISK PREVENTION PERTAINING TO THE HOME:  Any stairs in or around the home? No  If so, are there any without handrails? No  Home free of loose throw rugs in walkways, pet beds, electrical cords, etc? Yes  Adequate lighting in your home to reduce risk of falls? Yes   ASSISTIVE DEVICES UTILIZED TO PREVENT FALLS:  Life alert? No  Use of a cane, walker or w/c? Yes  Grab bars in the bathroom? No  Shower chair or bench in shower? Yes  Elevated toilet seat or a handicapped toilet? Yes    Cognitive Function:        04/23/2023    3:38 PM  6CIT Screen  What Year? 0 points  What month? 0 points  What time? 0 points  Count back from 20 0 points  Months in reverse 0 points  Repeat phrase 0 points  Total Score 0 points    Immunizations Immunization History  Administered Date(s) Administered   Fluad Quad(high Dose 65+) 08/07/2020, 09/10/2021, 08/16/2022   Moderna Covid-19 Vaccine Bivalent Booster 67yrs & up 09/25/2021   Moderna SARS-COV2 Booster Vaccination 08/25/2022   Moderna Sars-Covid-2 Vaccination 12/03/2019, 01/06/2020, 09/04/2020   Pneumococcal  Conjugate-13 08/28/2017   Pneumococcal Polysaccharide-23 11/09/2018   Td 10/22/2003   Tdap 06/27/2019   Zoster Recombinat (Shingrix) 09/14/2018, 04/30/2019    TDAP status: Up to date  Flu Vaccine status: Up to date  Pneumococcal vaccine status: Up to date  Covid-19 vaccine status: Completed vaccines  Qualifies for Shingles Vaccine? Yes   Zostavax completed Yes   Shingrix Completed?: Yes  Screening Tests Health Maintenance  Topic Date Due   COVID-19 Vaccine (6 - 2023-24 season) 10/20/2022   INFLUENZA VACCINE  06/14/2023   Colonoscopy  11/05/2023   Medicare Annual Wellness (AWV)  04/22/2024   DTaP/Tdap/Td (3 - Td or Tdap) 06/26/2029   Pneumonia Vaccine 58+ Years old  Completed   Hepatitis C Screening  Completed   Zoster Vaccines- Shingrix  Completed   HPV VACCINES  Aged Out    Health Maintenance  Health Maintenance Due  Topic Date Due   COVID-19 Vaccine (6 - 2023-24 season) 10/20/2022    Colorectal cancer screening: Type of screening: Colonoscopy. Completed 11/04/18. Repeat every 5 years  Lung Cancer Screening: (Low Dose CT Chest recommended if Age 61-80 years, 30 pack-year currently smoking OR have quit w/in 15years.) does not qualify.   Lung Cancer Screening Referral: no  Additional Screening:  Hepatitis C Screening: does qualify; Completed 04/18/23  Vision Screening: Recommended annual ophthalmology exams for early detection of glaucoma and other disorders of the eye. Is the patient up to date with their annual eye exam?  Yes  Who is the provider or what is the name of the office in which the patient attends annual eye exams? Dr.Tanner If pt is not established with a provider, would they like to be referred to a provider to establish care? Yes .   Dental Screening: Recommended annual dental exams for proper oral hygiene  Community Resource Referral / Chronic Care Management: CRR required this visit?  No   CCM required this visit?  No      Plan:      I have personally reviewed and noted the following in the patient's chart:   Medical and social history Use of alcohol, tobacco or illicit drugs  Current medications and supplements including opioid prescriptions. Patient is not currently taking opioid prescriptions. Functional ability and status Nutritional status Physical activity Advanced directives List of other physicians Hospitalizations, surgeries, and ER visits in previous 12 months Vitals Screenings to include cognitive, depression, and falls Referrals and appointments  In addition, I have reviewed and discussed with patient certain preventive protocols, quality metrics, and best practice recommendations. A written personalized care plan for preventive services as well as general preventive health recommendations were provided to patient.     Maryan Puls, LPN   [id number]   Nurse Notes: Order placed for colonoscopy.

## 2023-04-23 NOTE — Patient Instructions (Signed)
[name] , Thank you for taking time to come for your Medicare Wellness Visit. I appreciate your ongoing commitment to your health goals. Please review the following plan we discussed and let me know if I can assist you in the future.   These are the goals we discussed:  Goals      Patient Stated     Continue to work on walking     Patient Stated     Lose weight and walk better.        This is a list of the screening recommended for you and due dates:  Health Maintenance  Topic Date Due   COVID-19 Vaccine (6 - 2023-24 season) 10/20/2022   Flu Shot  06/14/2023   Colon Cancer Screening  11/05/2023   Medicare Annual Wellness Visit  04/22/2024   DTaP/Tdap/Td vaccine (3 - Td or Tdap) 06/26/2029   Pneumonia Vaccine  Completed   Hepatitis C Screening  Completed   Zoster (Shingles) Vaccine  Completed   HPV Vaccine  Aged Out    Advanced directives: Please bring a copy of your health care power of attorney and living will to the office to be added to your chart at your convenience.   Conditions/risks identified: none  Next appointment: Follow up in one year for your annual wellness visit. 04/23/24 @ 2:30 televisit  Preventive Care 65 Years and Older, Male  Preventive care refers to lifestyle choices and visits with your health care provider that can promote health and wellness. What does preventive care include? A yearly physical exam. This is also called an annual well check. Dental exams once or twice a year. Routine eye exams. Ask your health care provider how often you should have your eyes checked. Personal lifestyle choices, including: Daily care of your teeth and gums. Regular physical activity. Eating a healthy diet. Avoiding tobacco and drug use. Limiting alcohol use. Practicing safe sex. Taking low doses of aspirin every day. Taking vitamin and mineral supplements as recommended by your health care provider. What happens during an annual well check? The services  and screenings done by your health care provider during your annual well check will depend on your age, overall health, lifestyle risk factors, and family history of disease. Counseling  Your health care provider may ask you questions about your: Alcohol use. Tobacco use. Drug use. Emotional well-being. Home and relationship well-being. Sexual activity. Eating habits. History of falls. Memory and ability to understand (cognition). Work and work Astronomer. Screening  You may have the following tests or measurements: Height, weight, and BMI. Blood pressure. Lipid and cholesterol levels. These may be checked every 5 years, or more frequently if you are over 25 years old. Skin check. Lung cancer screening. You may have this screening every year starting at age 53 if you have a 30-pack-year history of smoking and currently smoke or have quit within the past 15 years. Fecal occult blood test (FOBT) of the stool. You may have this test every year starting at age 71. Flexible sigmoidoscopy or colonoscopy. You may have a sigmoidoscopy every 5 years or a colonoscopy every 10 years starting at age 76. Prostate cancer screening. Recommendations will vary depending on your family history and other risks. Hepatitis C blood test. Hepatitis B blood test. Sexually transmitted disease (STD) testing. Diabetes screening. This is done by checking your blood sugar (glucose) after you have not eaten for a while (fasting). You may have this done every 1-3 years. Abdominal aortic aneurysm (AAA) screening. You  may need this if you are a current or former smoker. Osteoporosis. You may be screened starting at age 37 if you are at high risk. Talk with your health care provider about your test results, treatment options, and if necessary, the need for more tests. Vaccines  Your health care provider may recommend certain vaccines, such as: Influenza vaccine. This is recommended every year. Tetanus, diphtheria,  and acellular pertussis (Tdap, Td) vaccine. You may need a Td booster every 10 years. Zoster vaccine. You may need this after age 22. Pneumococcal 13-valent conjugate (PCV13) vaccine. One dose is recommended after age 59. Pneumococcal polysaccharide (PPSV23) vaccine. One dose is recommended after age 10. Talk to your health care provider about which screenings and vaccines you need and how often you need them. This information is not intended to replace advice given to you by your health care provider. Make sure you discuss any questions you have with your health care provider. Document Released: 11/26/2015 Document Revised: 07/19/2016 Document Reviewed: 08/31/2015 Elsevier Interactive Patient Education  2017 [employer] Prevention in the Home Falls can cause injuries. They can happen to people of all ages. There are many things you can do to make your home safe and to help prevent falls. What can I do on the outside of my home? Regularly fix the edges of walkways and driveways and fix any cracks. Remove anything that might make you trip as you walk through a door, such as a raised step or threshold. Trim any bushes or trees on the path to your home. Use bright outdoor lighting. Clear any walking paths of anything that might make someone trip, such as rocks or tools. Regularly check to see if handrails are loose or broken. Make sure that both sides of any steps have handrails. Any raised decks and porches should have guardrails on the edges. Have any leaves, snow, or ice cleared regularly. Use sand or salt on walking paths during winter. Clean up any spills in your garage right away. This includes oil or grease spills. What can I do in the bathroom? Use night lights. Install grab bars by the toilet and in the tub and shower. Do not use towel bars as grab bars. Use non-skid mats or decals in the tub or shower. If you need to sit down in the shower, use a plastic, non-slip  stool. Keep the floor dry. Clean up any water that spills on the floor as soon as it happens. Remove soap buildup in the tub or shower regularly. Attach bath mats securely with double-sided non-slip rug tape. Do not have throw rugs and other things on the floor that can make you trip. What can I do in the bedroom? Use night lights. Make sure that you have a light by your bed that is easy to reach. Do not use any sheets or blankets that are too big for your bed. They should not hang down onto the floor. Have a firm chair that has side arms. You can use this for support while you get dressed. Do not have throw rugs and other things on the floor that can make you trip. What can I do in the kitchen? Clean up any spills right away. Avoid walking on wet floors. Keep items that you use a lot in easy-to-reach places. If you need to reach something above you, use a strong step stool that has a grab bar. Keep electrical cords out of the way. Do not use floor polish or wax that  makes floors slippery. If you must use wax, use non-skid floor wax. Do not have throw rugs and other things on the floor that can make you trip. What can I do with my stairs? Do not leave any items on the stairs. Make sure that there are handrails on both sides of the stairs and use them. Fix handrails that are broken or loose. Make sure that handrails are as long as the stairways. Check any carpeting to make sure that it is firmly attached to the stairs. Fix any carpet that is loose or worn. Avoid having throw rugs at the top or bottom of the stairs. If you do have throw rugs, attach them to the floor with carpet tape. Make sure that you have a light switch at the top of the stairs and the bottom of the stairs. If you do not have them, ask someone to add them for you. What else can I do to help prevent falls? Wear shoes that: Do not have high heels. Have rubber bottoms. Are comfortable and fit you well. Are closed at the  toe. Do not wear sandals. If you use a stepladder: Make sure that it is fully opened. Do not climb a closed stepladder. Make sure that both sides of the stepladder are locked into place. Ask someone to hold it for you, if possible. Clearly mark and make sure that you can see: Any grab bars or handrails. First and last steps. Where the edge of each step is. Use tools that help you move around (mobility aids) if they are needed. These include: Canes. Walkers. Scooters. Crutches. Turn on the lights when you go into a dark area. Replace any light bulbs as soon as they burn out. Set up your furniture so you have a clear path. Avoid moving your furniture around. If any of your floors are uneven, fix them. If there are any pets around you, be aware of where they are. Review your medicines with your doctor. Some medicines can make you feel dizzy. This can increase your chance of falling. Ask your doctor what other things that you can do to help prevent falls. This information is not intended to replace advice given to you by your health care provider. Make sure you discuss any questions you have with your health care provider. Document Released: 08/26/2009 Document Revised: 04/06/2016 Document Reviewed: 12/04/2014 Elsevier Interactive Patient Education  2017 [employer].

## 2023-04-25 ENCOUNTER — Ambulatory Visit (INDEPENDENT_AMBULATORY_CARE_PROVIDER_SITE_OTHER): Payer: Medicare HMO | Admitting: Family Medicine

## 2023-04-25 ENCOUNTER — Encounter: Payer: Medicare HMO | Admitting: Family Medicine

## 2023-04-25 ENCOUNTER — Encounter: Payer: Self-pay | Admitting: Family Medicine

## 2023-04-25 VITALS — BP 118/70 | HR 67 | Temp 98.4°F | Ht 70.75 in | Wt 246.0 lb

## 2023-04-25 DIAGNOSIS — I700 Atherosclerosis of aorta: Secondary | ICD-10-CM | POA: Diagnosis not present

## 2023-04-25 DIAGNOSIS — E785 Hyperlipidemia, unspecified: Secondary | ICD-10-CM | POA: Diagnosis not present

## 2023-04-25 DIAGNOSIS — Z0000 Encounter for general adult medical examination without abnormal findings: Secondary | ICD-10-CM

## 2023-04-25 DIAGNOSIS — G20A1 Parkinson's disease without dyskinesia, without mention of fluctuations: Secondary | ICD-10-CM | POA: Diagnosis not present

## 2023-04-25 DIAGNOSIS — G4733 Obstructive sleep apnea (adult) (pediatric): Secondary | ICD-10-CM

## 2023-04-25 NOTE — Assessment & Plan Note (Signed)
Followed by neurology. On Sinemet 25/100 mg p.o. 3 times daily.  Doing physical therapy.

## 2023-04-25 NOTE — Assessment & Plan Note (Signed)
LDL goal less than 70 given aortic atherosclerosis On Crestor 10 mg daily, fenofibrate 160 mg p.o. daily 

## 2023-04-25 NOTE — Progress Notes (Signed)
Patient ID: [name], male    DOB: [birth year], 77 y.o.   MRN: [id number]  This visit was conducted in person.  BP 118/70 (BP Location: Left Arm, Patient Position: Sitting, Cuff Size: Normal)   Pulse 67   Temp 98.4 F (36.9 C) (Temporal)   Ht 5' 10.75" (1.797 m)   Wt 246 lb (111.6 kg)   SpO2 97%   BMI 34.55 kg/m    CC:  Chief Complaint  Patient presents with   Medicare Wellness    Part 2    Subjective:   HPI: [name] is a 77 y.o. male presenting on 04/25/2023 for Medicare Wellness (Part 2)  The patient presents for  complete physical and review of chronic health problems. He/She also has the following acute concerns today:  The patient saw a LPN or RN for medicare wellness visit. 04/23/2023  Prevention and wellness was reviewed in detail. Note reviewed and important notes copied below.  Parkinson's disease: Diagnosed in 2019 Followed by neurology Dr. Frances Furbish.. reviewed last OV note from 06/2022 On Sinemet 25/100 mg p.o. 3 times daily.  Doing physical therapy.  Elevated Cholesterol: LDL goal less than 70 given aortic atherosclerosis On Crestor 10 mg daily, fenofibrate 160 mg p.o. daily Saw cardiology Dr. Mayford Knife in early 2023 for irregular heart rate.( PACs seen on CPAP monitor). wore monitor... ZIO showed NSR with episodes of SVT.Marland Kitchen started on toprol XL Lab Results  Component Value Date   CHOL 92 04/18/2023   HDL 45.20 04/18/2023   LDLCALC 35 04/18/2023   TRIG 61.0 04/18/2023   CHOLHDL 2 04/18/2023  Using medications without problems: no side effects Muscle aches:  Diet compliance: moderate Exercise: as tolerated Other complaints:    Obstructive sleep apnea: Using CPAP, well controlled.    Peripheral edema in right lower leg given history of recurrent cellulitis.  He takes low-dose furosemide 10 mg daily to help with swelling.  Wears compression hose  Relevant past medical, surgical, family and social history reviewed and updated as indicated. Interim  medical history since our last visit reviewed. Allergies and medications reviewed and updated. Outpatient Medications Prior to Visit  Medication Sig Dispense Refill   ASPIRIN 81 PO Take by mouth daily.     carbidopa-levodopa (SINEMET IR) 25-100 MG tablet Take 1 tablet by mouth 3 (three) times daily. 270 tablet 3   cetirizine (ZYRTEC ALLERGY) 10 MG tablet Take 1 tablet (10 mg total) by mouth daily. 90 tablet 3   Coenzyme Q10 (COQ10 PO) Take by mouth. 1 per day     fenofibrate 160 MG tablet Take 1 tablet by mouth once daily 90 tablet 3   furosemide (LASIX) 20 MG tablet Take 0.5 tablets (10 mg total) by mouth daily. 45 tablet 3   glucosamine-chondroitin 500-400 MG tablet Take 1 tablet by mouth daily.     ketoconazole (NIZORAL) 2 % cream Apply 1 Application topically daily. 60 g 0   ketoconazole (NIZORAL) 2 % shampoo Apply 1 Application topically 2 (two) times a week.     metoprolol succinate (TOPROL-XL) 25 MG 24 hr tablet Take 1 tablet by mouth once daily 90 tablet 0   Multiple Vitamins-Minerals (MULTIVITAMIN WITH MINERALS) tablet Take 1 tablet by mouth daily.     mupirocin ointment (BACTROBAN) 2 % Apply topically as needed.     nystatin (MYCOSTATIN/NYSTOP) powder Apply 1 Application topically 3 (three) times daily. 15 g 0   pramipexole (MIRAPEX) 1 MG tablet Take 1 tablet (1 mg total)  by mouth in the morning, at noon, and at bedtime. 270 tablet 3   rosuvastatin (CRESTOR) 10 MG tablet Take 1 tablet by mouth once daily 90 tablet 0   desonide (DESOWEN) 0.05 % cream Apply topically 2 (two) times daily. (Patient not taking: Reported on 04/23/2023) 30 g 0   No facility-administered medications prior to visit.     Per HPI unless specifically indicated in ROS section below Review of Systems  Constitutional:  Negative for fatigue and fever.  HENT:  Negative for ear pain.   Eyes:  Negative for pain.  Respiratory:  Negative for cough and shortness of breath.   Cardiovascular:  Negative for chest pain,  palpitations and leg swelling.  Gastrointestinal:  Negative for abdominal pain.  Genitourinary:  Negative for dysuria.  Musculoskeletal:  Negative for arthralgias.  Neurological:  Negative for syncope, light-headedness and headaches.  Psychiatric/Behavioral:  Negative for dysphoric mood.    Objective:  BP 118/70 (BP Location: Left Arm, Patient Position: Sitting, Cuff Size: Normal)   Pulse 67   Temp 98.4 F (36.9 C) (Temporal)   Ht 5' 10.75" (1.797 m)   Wt 246 lb (111.6 kg)   SpO2 97%   BMI 34.55 kg/m   Wt Readings from Last 3 Encounters:  04/25/23 246 lb (111.6 kg)  04/23/23 240 lb (108.9 kg)  12/25/22 246 lb (111.6 kg)      Physical Exam Constitutional:      Appearance: He is well-developed.     Comments: Using rolling walker  HENT:     Head: Normocephalic.     Right Ear: Hearing normal.     Left Ear: Hearing normal.     Nose: Nose normal.  Neck:     Thyroid: No thyroid mass or thyromegaly.     Vascular: No carotid bruit.     Trachea: Trachea normal.  Cardiovascular:     Rate and Rhythm: Normal rate and regular rhythm.     Pulses: Normal pulses.     Heart sounds: Heart sounds not distant. No murmur heard.    No friction rub. No gallop.     Comments: No peripheral edema Pulmonary:     Effort: Pulmonary effort is normal. No respiratory distress.     Breath sounds: Normal breath sounds.  Skin:    General: Skin is warm and dry.     Findings: No rash.  Neurological:     Cranial Nerves: Cranial nerves 2-12 are intact.     Sensory: Sensation is intact.     Motor: Motor function is intact.     Coordination: Coordination abnormal.     Gait: Gait abnormal.  Psychiatric:        Speech: Speech normal.        Behavior: Behavior normal.        Thought Content: Thought content normal.       Results for orders placed or performed in visit on 04/18/23  Comprehensive metabolic panel  Result Value Ref Range   Sodium 142 135 - 145 mEq/L   Potassium 3.9 3.5 - 5.1 mEq/L    Chloride 107 96 - 112 mEq/L   CO2 22 19 - 32 mEq/L   Glucose, Bld 96 70 - 99 mg/dL   BUN 17 6 - 23 mg/dL   Creatinine, Ser 1.61 0.40 - 1.50 mg/dL   Total Bilirubin 0.6 0.2 - 1.2 mg/dL   Alkaline Phosphatase 22 (L) 39 - 117 U/L   AST 25 0 - 37 U/L   ALT 7  0 - 53 U/L   Total Protein 6.8 6.0 - 8.3 g/dL   Albumin 4.2 3.5 - 5.2 g/dL   GFR 16.10 >96.04 mL/min   Calcium 9.2 8.4 - 10.5 mg/dL  Lipid panel  Result Value Ref Range   Cholesterol 92 0 - 200 mg/dL   Triglycerides 54.0 0.0 - 149.0 mg/dL   HDL 98.11 >91.47 mg/dL   VLDL 82.9 0.0 - 56.2 mg/dL   LDL Cholesterol 35 0 - 99 mg/dL   Total CHOL/HDL Ratio 2    NonHDL 47.05   PSA, Medicare  Result Value Ref Range   PSA 0.22 0.10 - 4.00 ng/ml  Hepatitis C antibody  Result Value Ref Range   Hepatitis C Ab NON-REACTIVE NON-REACTIVE     COVID 19 screen:  No recent travel or known exposure to COVID19 The patient denies respiratory symptoms of COVID 19 at this time. The importance of social distancing was discussed today.   Assessment and Plan The patient's preventative maintenance and recommended screening tests for an annual wellness exam were reviewed in full today. Brought up to date unless services declined.  Counselled on the importance of diet, exercise, and its role in overall health and mortality. The patient's FH and SH was reviewed, including their home life, tobacco status, and drug and alcohol status.    Vaccines: COVID x 5, influenza vaccine in fall, Tdap August 2020, completed pneumonia and Shingrix series Prostate Cancer Screen:   father with prostate cancer Lab Results  Component Value Date   PSA 0.22 04/18/2023   PSA 0.23 04/19/2022   PSA 0.26 11/25/2020  Colon Cancer Screen: November 04, 2018,  Was normal... plan none further given age.      Smoking Status: Former smoker ETOH/ drug use: 2 beers a day three times a week/none  Hep C: Negative     Problem List Items Addressed This Visit     Aortic  atherosclerosis (HCC) (Chronic)    LDL goal less than 70 given aortic atherosclerosis On Crestor 10 mg daily, fenofibrate 160 mg p.o. daily      Hyperlipidemia LDL goal <70 (Chronic)    Stable, chronic.  Continue current medication.  LDL goal less than 70 given aortic atherosclerosis On Crestor 10 mg daily, fenofibrate 160 mg p.o. daily      OSA (obstructive sleep apnea) (Chronic)    Using CPAP, well controlled.      Parkinson's disease (Chronic)    Followed by neurology. On Sinemet 25/100 mg p.o. 3 times daily.  Doing physical therapy.      Other Visit Diagnoses     Routine general medical examination at a health care facility    -  Primary       Kerby Nora, MD

## 2023-04-25 NOTE — Assessment & Plan Note (Signed)
Using CPAP, well controlled. 

## 2023-04-25 NOTE — Assessment & Plan Note (Signed)
Stable, chronic.  Continue current medication.  LDL goal less than 70 given aortic atherosclerosis On Crestor 10 mg daily, fenofibrate 160 mg p.o. daily 

## 2023-05-02 ENCOUNTER — Encounter: Payer: Self-pay | Admitting: *Deleted

## 2023-05-19 ENCOUNTER — Other Ambulatory Visit: Payer: Self-pay | Admitting: Cardiology

## 2023-05-19 DIAGNOSIS — I700 Atherosclerosis of aorta: Secondary | ICD-10-CM

## 2023-05-21 NOTE — Telephone Encounter (Signed)
*  STAT* If patient is at the pharmacy, call can be transferred to refill team.   1. Which medications need to be refilled? (please list name of each medication and dose if known)  Metoprolol and Rosuvastatin  2. Which pharmacy/location (including street and city if local pharmacy) is medication to be sent to?[facility] [address] [place], [place]   3. Do they need a 30 day or 90 day supply?  Enough until his appointment on Wednesday. p

## 2023-05-21 NOTE — Progress Notes (Unsigned)
  Cardiology Office Note:  .   Date:  05/21/2023  ID:  [name], DOB 01/06/1946, MRN [id number] PCP: Gweneth Dimitri, MD  [facility] Providers Cardiologist:  Armanda Magic, MD { Click to update primary MD,subspecialty MD or APP then REFRESH:1}   Patient Profile: .      PMH Atrial tachycardia Cardiac monitor completed 02/23/2022 revealed episodes of atrial tachycardia up to 46 seconds in duration started Toprol XL 25 mg daily CAD CT calcium score 02/01/2022 coronary calcium score of 533 (69th percentile) and aortic atherosclerosis Hyperlipidemia Parkinson' disease OSA On CPAP, managed by neurology Family history of CAD  He established with cardiology and was seen by Dr. Mayford Knife 02/01/2022 for abnormal EKG during sleep study.  His last cardiology clinic visit was 04/05/2022 with Chelsea Aus, PA at which time he has no specific cardiac symptoms. LDL was well controlled at 34. One year follow-up was recommended.        History of Present Illness: .   [name] is a *** 77 y.o. male ***  ROS: ***       Studies Reviewed: .        *** Risk Assessment/Calculations:     No BP recorded.  {Refresh Note OR Click here to enter BP  :1}***       Physical Exam:   VS:  There were no vitals taken for this visit.   Wt Readings from Last 3 Encounters:  04/25/23 246 lb (111.6 kg)  04/23/23 240 lb (108.9 kg)  12/25/22 246 lb (111.6 kg)    GEN: Well nourished, well developed in no acute distress NECK: No JVD; No carotid bruits CARDIAC: ***RRR, no murmurs, rubs, gallops RESPIRATORY:  Clear to auscultation without rales, wheezing or rhonchi  ABDOMEN: Soft, non-tender, non-distended EXTREMITIES:  No edema; No deformity     ASSESSMENT AND PLAN: .     CAD: Elevated coronary calcium score of 533 on CT 01/2022.  Aortic atherosclerosis/Hyperlipidemia LDL goal < 70: LDL 35 on 04/18/23  Atrial tachycardia:  OSA:    {Are you ordering a CV Procedure (e.g. stress test, cath, DCCV, TEE,  etc)?   Press F2        :[id number]}  Dispo: ***  Signed, Eligha Bridegroom, NP-C

## 2023-05-22 ENCOUNTER — Telehealth: Payer: Self-pay | Admitting: Family Medicine

## 2023-05-22 MED ORDER — FENOFIBRATE 160 MG PO TABS: 160.0000 mg | ORAL_TABLET | Freq: Every day | ORAL | 3 refills | Status: DC

## 2023-05-22 NOTE — Telephone Encounter (Signed)
Prescription Request  05/22/2023  LOV: 04/25/2023  What is the name of the medication or equipment? fenofibrate 160 MG tablet [[id number]]   Have you contacted your pharmacy to request a refill? Yes   Which pharmacy would you like this sent to?    [facility] [address] [place], [place] - [address] [place] [address] Phone: [phone] Fax: [phone]    Patient notified that their request is being sent to the clinical staff for review and that they should receive a response within 2 business days.   Please advise at Mobile [phone] (mobile)

## 2023-05-22 NOTE — Telephone Encounter (Signed)
Refill sent as requested. 

## 2023-05-23 ENCOUNTER — Encounter: Payer: Self-pay | Admitting: Nurse Practitioner

## 2023-05-23 ENCOUNTER — Ambulatory Visit: Payer: Medicare HMO | Attending: Nurse Practitioner | Admitting: Nurse Practitioner

## 2023-05-23 VITALS — BP 120/64 | HR 69 | Ht 70.75 in | Wt 248.0 lb

## 2023-05-23 DIAGNOSIS — I700 Atherosclerosis of aorta: Secondary | ICD-10-CM

## 2023-05-23 DIAGNOSIS — E785 Hyperlipidemia, unspecified: Secondary | ICD-10-CM

## 2023-05-23 DIAGNOSIS — I4719 Other supraventricular tachycardia: Secondary | ICD-10-CM

## 2023-05-23 DIAGNOSIS — G4733 Obstructive sleep apnea (adult) (pediatric): Secondary | ICD-10-CM | POA: Diagnosis not present

## 2023-05-23 DIAGNOSIS — R931 Abnormal findings on diagnostic imaging of heart and coronary circulation: Secondary | ICD-10-CM | POA: Diagnosis not present

## 2023-05-23 DIAGNOSIS — R600 Localized edema: Secondary | ICD-10-CM

## 2023-05-23 NOTE — Patient Instructions (Signed)
Medication Instructions:  Your physician has recommended you make the following change in your medication:  1.Stop fenofibrate  *If you need a refill on your cardiac medications before your next appointment, please call your pharmacy*  Lab Work: None ordered If you have labs (blood work) drawn today and your tests are completely normal, you will receive your results only by: MyChart Message (if you have MyChart) OR A paper copy in the mail If you have any lab test that is abnormal or we need to change your treatment, we will call you to review the results.  Follow-Up: At [facility], you and your health needs are our priority.  As part of our continuing mission to provide you with exceptional heart care, we have created designated Provider Care Teams.  These Care Teams include your primary Cardiologist (physician) and Advanced Practice Providers (APPs -  Physician Assistants and Nurse Practitioners) who all work together to provide you with the care you need, when you need it.  Your next appointment:   1 year(s)  Provider:   Armanda Magic, MD

## 2023-06-05 ENCOUNTER — Ambulatory Visit: Payer: Medicare HMO | Attending: Family Medicine | Admitting: Speech Pathology

## 2023-06-05 ENCOUNTER — Telehealth: Payer: Self-pay | Admitting: Occupational Therapy

## 2023-06-05 ENCOUNTER — Ambulatory Visit: Payer: Medicare HMO | Admitting: Occupational Therapy

## 2023-06-05 ENCOUNTER — Ambulatory Visit: Payer: Medicare HMO | Admitting: Physical Therapy

## 2023-06-05 DIAGNOSIS — M6281 Muscle weakness (generalized): Secondary | ICD-10-CM | POA: Insufficient documentation

## 2023-06-05 DIAGNOSIS — R278 Other lack of coordination: Secondary | ICD-10-CM

## 2023-06-05 DIAGNOSIS — R293 Abnormal posture: Secondary | ICD-10-CM

## 2023-06-05 DIAGNOSIS — R471 Dysarthria and anarthria: Secondary | ICD-10-CM | POA: Insufficient documentation

## 2023-06-05 DIAGNOSIS — G20A1 Parkinson's disease without dyskinesia, without mention of fluctuations: Secondary | ICD-10-CM

## 2023-06-05 NOTE — Telephone Encounter (Signed)
[name]. [name] was seen today for PD screens. Pt would benefit from O.T. and P.T.  If you agree, can you please send referral for O.T./P.T.  to [facility] on [address] For Parkinson's evaluations via EPIC. Thanks,  Jene Every, OTR/L

## 2023-06-05 NOTE — Telephone Encounter (Signed)
Orders placed as requested.  Please let me know if you need anything else.  Thank you, Shanda Bumps, NP

## 2023-06-05 NOTE — Therapy (Signed)
[facility] [address] [place], [place], [address] Phone: [phone]   Fax:  [phone]  Patient Details  Name: VEER ELAMIN MRN: [id number] Date of Birth: 03-19-1946 Referring Provider:  Gweneth Dimitri, MD  Encounter Date: 06/05/2023  Speech Therapy Parkinson's Disease Screen   Decibel Level today: upper 60s dB  (WNL=70-72 dB) with sound level meter 30cm away from pt's mouth. Pt's conversational volume has maintained since last treatment course  Pt does not report difficulty with swallowing, which does not warrant further evaluation  Pt denies changes in cognitive function.   Pt does does not require speech therapy services at this time. Recommend ST screen in another 6 months  Maia Breslow, CCC-SLP 06/05/2023, 1:06 PM  [facility] [address] [place], [place], [address] Phone: [phone]   Fax:  [phone]

## 2023-06-05 NOTE — Therapy (Signed)
[facility] [address] [place], [place], [address] Phone: [phone]   Fax:  [phone]  Patient Details  Name: [name] MRN: [id number] Date of Birth: Mar 14, 1946 Referring Provider:  Excell Seltzer, MD  Encounter Date: 06/05/2023  Occupational Therapy Parkinson's Disease Screen  Hand dominance:  Rt   Physical Performance Test item #2 (simulated eating):  12 sec  Physical Performance Test item #4 (donning/doffing jacket):  26 sec  Fastening/unfastening 3 buttons in:  28.62 sec  9-hole peg test:    RUE  30.09 sec        LUE  26.86 sec  Change in ability to perform ADLs/IADLs:  Pt does not report any changes  Other Comments:  Handwriting - no decrease in size and 90-100% legible  Pt would benefit from occupational therapy evaluation due to slight decline in function and Rt hand coordination and donning/doffing jacket. Will recommend 2x/wk for 4 weeks    Sheran Lawless, OT 06/05/2023, 2:25 PM  [facility] [address] [place], [place], [address] Phone: [phone]   Fax:  [phone]

## 2023-06-05 NOTE — Therapy (Signed)
[facility] [address] [place], [place], [address] Phone: [phone]   Fax:  [phone]  Patient Details  Name: [name] MRN: [id number] Date of Birth: August 27, 1946 Referring Provider:  Gweneth Dimitri, MD  Encounter Date: 06/05/2023  Physical Therapy Parkinson's Disease Screen      10 meter walk test: 12.8 seconds = 2.56 ft/sec (previously 13 seconds = 2.52 ft/sec)    5 time sit to stand test: 20.53 seconds with no UE support (previously  22.41 seconds with no UE support 15.4 seconds)  Did the [name] neuro intensive for 6 weeks, wrapped in May. Trying to ride the bike every morning and doing his exercises. Feels like he needs to add some weights to some of the leg stuff he is doing. No falls. Using a mixture of the cane/rollator in the house. Or cane for smaller distances, otherwise uses the rollator.   Patient would benefit from Physical Therapy evaluation for a tune up for 2x week for 4 weeks to work on balance, strength, posture.      Drake Leach, PT, DPT 06/05/2023, 1:43 PM  [facility] [address] [place], [place], [address] Phone: [phone]   Fax:  [phone]

## 2023-06-11 ENCOUNTER — Other Ambulatory Visit: Payer: Self-pay | Admitting: Cardiology

## 2023-06-11 ENCOUNTER — Telehealth: Payer: Self-pay | Admitting: Family Medicine

## 2023-06-11 DIAGNOSIS — I700 Atherosclerosis of aorta: Secondary | ICD-10-CM

## 2023-06-11 NOTE — Telephone Encounter (Signed)
Patient's wife called back to let Dr. Ermalene Searing know to disregard this message. States patient's dermatologist manages this medication. Thanks!

## 2023-06-11 NOTE — Telephone Encounter (Signed)
Prescription Request  06/11/2023  LOV: 04/25/2023  What is the name of the medication or equipment? hydrocortisone lotion 2.5% lotion  Have you contacted your pharmacy to request a refill? No   Which pharmacy would you like this sent to?    [facility] [address] [place], [place] - [address] [place] [address] Phone: [phone] Fax: [phone]    Patient notified that their request is being sent to the clinical staff for review and that they should receive a response within 2 business days.   Please advise at Mobile [phone] (mobile)

## 2023-06-11 NOTE — Telephone Encounter (Signed)
Hydrocortisone lotion 2.5% is not on current medication list.  Please advise.

## 2023-06-26 ENCOUNTER — Encounter: Payer: Self-pay | Admitting: Occupational Therapy

## 2023-06-26 ENCOUNTER — Ambulatory Visit: Payer: Medicare HMO | Admitting: Occupational Therapy

## 2023-06-26 ENCOUNTER — Ambulatory Visit: Payer: Medicare HMO | Attending: Family Medicine | Admitting: Physical Therapy

## 2023-06-26 ENCOUNTER — Encounter: Payer: Self-pay | Admitting: Physical Therapy

## 2023-06-26 DIAGNOSIS — M6281 Muscle weakness (generalized): Secondary | ICD-10-CM | POA: Insufficient documentation

## 2023-06-26 DIAGNOSIS — G20A1 Parkinson's disease without dyskinesia, without mention of fluctuations: Secondary | ICD-10-CM | POA: Insufficient documentation

## 2023-06-26 DIAGNOSIS — R278 Other lack of coordination: Secondary | ICD-10-CM | POA: Diagnosis not present

## 2023-06-26 DIAGNOSIS — R2689 Other abnormalities of gait and mobility: Secondary | ICD-10-CM | POA: Insufficient documentation

## 2023-06-26 DIAGNOSIS — R29818 Other symptoms and signs involving the nervous system: Secondary | ICD-10-CM | POA: Insufficient documentation

## 2023-06-26 DIAGNOSIS — R2681 Unsteadiness on feet: Secondary | ICD-10-CM | POA: Diagnosis not present

## 2023-06-26 DIAGNOSIS — R293 Abnormal posture: Secondary | ICD-10-CM | POA: Insufficient documentation

## 2023-06-26 NOTE — Therapy (Unsigned)
OUTPATIENT PHYSICAL THERAPY NEURO EVALUATION   Patient Name: [name] MRN: [id number] DOB:[birth year], 77 y.o., male Today's Date: 06/27/2023   PCP: Excell Seltzer, MD   REFERRING PROVIDER: Ihor Austin, NP  END OF SESSION:  PT End of Session - 06/27/23 0921     Visit Number 1    Number of Visits 9    Date for PT Re-Evaluation 07/27/23    Authorization Type [employer]    PT Start Time 1445    PT Stop Time 1525    PT Time Calculation (min) 40 min    Equipment Utilized During Treatment Gait belt    Activity Tolerance Patient tolerated treatment well    Behavior During Therapy WFL for tasks assessed/performed                Past Medical History:  Diagnosis Date   Agatston coronary artery calcium score greater than 400    Coronary calcium score of 533. This was 69th percentile for age,   Allergy 15 yrs. or more   mosquito or wasp bites bad reaction   Aortic atherosclerosis (HCC)    Cataract 10 yrs. or more   slow bloom checkup every 6 mos.   Hyperlipidemia LDL goal <70    Neuromuscular disorder (HCC) 09/2018   Parkinsons   Parkinson disease    Sleep apnea 02/21/2021 sleep study   have Resair machine   Past Surgical History:  Procedure Laterality Date   TONSILLECTOMY     Patient Active Problem List   Diagnosis Date Noted   Agatston coronary artery calcium score greater than 400 02/02/2022   Aortic atherosclerosis (HCC) 02/01/2022   SVT (supraventricular tachycardia) 01/12/2022   OSA (obstructive sleep apnea) 01/12/2022   Parkinson's disease 11/25/2020   Edema of right lower leg 11/25/2020   Hyperlipidemia LDL goal <70 11/25/2020   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   Hearing loss 11/25/2020   Pityriasis rosea 11/25/2020    ONSET DATE: 06/05/2023  REFERRING DIAG: G20.A1 (ICD-10-CM) - Parkinson's disease without dyskinesia or fluctuating manifestations  THERAPY DIAG:  Muscle weakness (generalized)  Abnormal posture  Unsteadiness on  feet  Other abnormalities of gait and mobility  Other symptoms and signs involving the nervous system  Rationale for Evaluation and Treatment: Rehabilitation  SUBJECTIVE:                                                                                                                                                                                             SUBJECTIVE STATEMENT: Did the [name] neuro intensive for 6 weeks, wrapped in May. Trying to ride the bike every morning  and doing his exercises. Wants to work some more of some strengthening exercises. Catches himself going forwards.Has been working on his posture exercises and trying to slow down. Has some difficulties getting up from chairs, only if they are too low. Feels eyes closed are about the same.    Pt accompanied by: self  PERTINENT HISTORY: PD (dx 2019), HLD, cellulitis, lymphedema, low back pain, hearing loss   PAIN:  Are you having pain? No  PRECAUTIONS: Fall  WEIGHT BEARING RESTRICTIONS: No  FALLS: Has patient fallen in last 6 months? No  LIVING ENVIRONMENT: Lives with: lives with their spouse Lives in: House/apartment Stairs: No, only 1 step to enter in the front.  Has following equipment at home: Dan Humphreys - 2 wheeled, Environmental consultant - 4 wheeled, and Strong Arm [place], Walk in shower   PLOF: Independent with community mobility with device  PATIENT GOALS: Wants to keep working on getting on and off the floor, work on strengthening the legs.   OBJECTIVE:   COGNITION: Overall cognitive status: Within functional limits for tasks assessed   SENSATION: Numbness/tingling in legs only if he sits for too long, otherwise WNL   COORDINATION: Heel to shin: WNL   OBSERVATIONS: Swelling in bilat ankles (pt reports taking Lasix), sometimes won't take it when coming to therapy or unfamiliar environment  POSTURE: rounded shoulders, forward head, increased thoracic kyphosis, and flexed trunk    LOWER EXTREMITY MMT:    MMT  Right Eval Left Eval  Hip flexion 4 4+  Hip extension    Hip abduction 4 5  Hip adduction 5 5  Hip internal rotation    Hip external rotation    Knee flexion 5 5  Knee extension 5 5  Ankle dorsiflexion 5 4  Ankle plantarflexion    Ankle inversion    Ankle eversion    (Blank rows = not tested)  All tested in sitting   BED MOBILITY:  Pt reports independence, no difficulties   TRANSFERS: Assistive device utilized: None  Sit to stand: SBA Stand to sit: SBA Can perform without UE support, incr forward flexed posture in standing   GAIT: Gait pattern: step through pattern, decreased arm swing- Left, decreased stride length, decreased hip/knee flexion- Right, decreased hip/knee flexion- Left, Right foot flat, Left foot flat, lateral hip instability, and trunk flexed Distance walked: clinic distances  Assistive device utilized: Environmental consultant - 4 wheeled and Strong Arm Cane Level of assistance: SBA and CGA Comments: pt pushes rollator too far anteriorly with incr forward flexed posture during gait, noted some scissoring with RLE during gait, CGA at times when turning with cane   FUNCTIONAL TESTS:  5 times sit to stand: 22.9 seconds with no UE support  30 seconds chair stand test: 6 sit <> stands with no UE support, 6.5/10 RPE     M-CTSIB  Condition 1: Firm Surface, EO 30 Sec, Normal Sway  Condition 2: Firm Surface, EC 8.5 Sec, Mod Sway  Condition 3: Foam Surface, EO 30 Sec, Mild/Mod Sway  Condition 4: Foam Surface, EC 1.5 Sec     [facility] PT Assessment - 06/26/23 1511       Ambulation/Gait   Gait velocity 12.65 seconds with rollator = 2.59 ft/sec, 15.4 seconds with Strong Arm Cane = 2.13 ft/sec      Standardized Balance Assessment   Standardized Balance Assessment Timed Up and Go Test      Timed Up and Go Test   Normal TUG (seconds) 15.72    Cognitive TUG (seconds)  18.34   starting at 77, counting backwards by 3   TUG Comments performed with Strong Arm Cane               TODAY'S TREATMENT:                                                                                                                              DATE: 06/27/23  N/A during eval     PATIENT EDUCATION: Education details: Clinical findings, POC, areas to work on during therapy.  Person educated: Patient Education method: Explanation Education comprehension: verbalized understanding  HOME EXERCISE PROGRAM: [id number]  (from previous bout of therapy, will review/update as needed)  GOALS: Goals reviewed with patient? Yes  SHORT TERM GOALS: ALL STGS =LTGS  LONG TERM GOALS: Target date: 07/25/2023  Pt will be independent with final PD specific HEP with wife's supervision in order to build upon functional gains made in therapy.  Baseline:  Goal status: INITIAL  2.   Pt will perform floor transfers with SBA for improved balance recovery.  Baseline:  Goal status: INITIAL  3.  Pt will hold condition 2 of mCTSIB for at least 20 seconds for improved balance with EC   Baseline: 8.5 seconds Goal status: INITIAL  4.  Pt will improve TUG time with Strong Arm Cane to 14  seconds or less in order to demo decrease fall risk.  Baseline: 15.7 seconds  Goal status: INITIAL  5.  Pt will improve cog TUG time with Strong Arm Cane to 16 seconds or less in order to demo improved dual tasking.  Baseline: 18.34 seconds Goal status: INITIAL  6.  Pt will improve gait speed with Strong Arm Cane to at least 2.3 ft/sec in order to demo improved community mobility.   Baseline: 2.13 ft/sec  Goal status: INITIAL  7.  Pt will perform 30 second chair stand with no UE support with 7 sit <> stands and RPE at 5/10 or less in order to demo improved strength/endurance.   Baseline: 6 sit <> stands with no UE support, 6.5/10 RPE  Goal status: INITIAL  ASSESSMENT:  CLINICAL IMPRESSION: Patient is a 77 y.o. male who is well known to this clinic was seen today for physical therapy evaluation and treatment  for PD. Pt recently seen for PD screens and has had a slight slowing in outcome measures. Pt would also benefit from a tune up for strength/balance.  Pt's PMH is significant for: PD (dx 2019), HLD, cellulitis, lymphedema, low back pain, hearing loss. The following deficits were present during the exam: imbalance, gait abnormalities, postural impairments, decr functional strength/mobility, impaired timing and coordination, bradykinesia, impaired ability with dual tasking, postural instability. Based on 5x sit <> stand and TUG pt is an incr risk for falls. Based on gait speeds with rollator and Strong Arm Cane, pt is a limited community ambulator. Based on mCTSIB, pt with incr visual reliance for balance and decr vestibular input. Pt  would benefit from skilled PT to address these impairments and functional limitations to maximize functional mobility independence and decr fall risk   OBJECTIVE IMPAIRMENTS: Abnormal gait, decreased activity tolerance, decreased balance, decreased coordination, decreased mobility, difficulty walking, decreased strength, impaired flexibility, and postural dysfunction.   ACTIVITY LIMITATIONS: lifting, bending, squatting, stairs, transfers, and locomotion level  PARTICIPATION LIMITATIONS: community activity and yard work  PERSONAL FACTORS: Age, Past/current experiences, Time since onset of injury/illness/exacerbation, and 3+ comorbidities: PD (dx 2019), HLD, cellulitis, lymphedema, low back pain, hearing loss   are also affecting patient's functional outcome.   REHAB POTENTIAL: Good  CLINICAL DECISION MAKING: Stable/uncomplicated  EVALUATION COMPLEXITY: Low  PLAN:  PT FREQUENCY: 2x/week  PT DURATION: 4 weeks  PLANNED INTERVENTIONS: Therapeutic exercises, Therapeutic activity, Neuromuscular re-education, Balance training, Gait training, Patient/Family education, Self Care, Joint mobilization, Stair training, Vestibular training, DME instructions, Manual therapy, and  Re-evaluation  PLAN FOR NEXT SESSION: Review previous HEP and update, work on R hip strengthening, balance with EC, unlevel surfaces, dual tasking    Drake Leach, PT, DPT 06/27/2023, 9:24 AM

## 2023-06-26 NOTE — Therapy (Unsigned)
OUTPATIENT OCCUPATIONAL THERAPY PARKINSON'S EVALUATION  Patient Name: [name] MRN: [id number] DOB:October 10, 1946, 77 y.o., male Today's Date: 06/26/2023  PCP: Excell Seltzer, MD  REFERRING PROVIDER: Ihor Austin, NP  END OF SESSION:  OT End of Session - 06/26/23 1346     Visit Number 1    Number of Visits 9    Date for OT Re-Evaluation 07/27/23    Authorization Type [employer] - auth requested    Progress Note Due on Visit 9    OT Start Time 1406    OT Stop Time 1437    OT Time Calculation (min) 31 min    Activity Tolerance Patient tolerated treatment well    Behavior During Therapy WFL for tasks assessed/performed             Past Medical History:  Diagnosis Date   Agatston coronary artery calcium score greater than 400    Coronary calcium score of 533. This was 69th percentile for age,   Allergy 15 yrs. or more   mosquito or wasp bites bad reaction   Aortic atherosclerosis (HCC)    Cataract 10 yrs. or more   slow bloom checkup every 6 mos.   Hyperlipidemia LDL goal <70    Neuromuscular disorder (HCC) 09/2018   Parkinsons   Parkinson disease    Sleep apnea 02/21/2021 sleep study   have Resair machine   Past Surgical History:  Procedure Laterality Date   TONSILLECTOMY     Patient Active Problem List   Diagnosis Date Noted   Agatston coronary artery calcium score greater than 400 02/02/2022   Aortic atherosclerosis (HCC) 02/01/2022   SVT (supraventricular tachycardia) 01/12/2022   OSA (obstructive sleep apnea) 01/12/2022   Parkinson's disease 11/25/2020   Edema of right lower leg 11/25/2020   Hyperlipidemia LDL goal <70 11/25/2020   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 He has not been completing [employer] as much. He does still have his exercise binder.   Pt accompanied by: self  PERTINENT HISTORY: PD (dx 2019), HLD, cellulitis, lymphedema, low back pain, hearing loss   PRECAUTIONS: Fall  WEIGHT BEARING RESTRICTIONS: No  PAIN:  Are you having pain? No  FALLS: Has patient fallen in last 6 months? No  LIVING ENVIRONMENT: Lives with: lives with their spouse Lives in: House/apartment Stairs: Yes: External: 1 steps; none Has following equipment at home that he uses: Environmental consultant - 4 wheeled and strong arm cane ; CPAP; hearing aids  PLOF: Independent; retired professor; driving  PATIENT GOALS: improve writing and use of hands  OBJECTIVE:   HAND DOMINANCE: Right  ADLs: Overall ADLs: mod I  UB Dressing: A with buttons  IADLs: Mostly mod I Handwriting: 90% legible  MOBILITY STATUS: Needs Assist: uses Rollator with SBA  POSTURE COMMENTS:  rounded shoulders, forward head, decreased lumbar lordosis, increased thoracic kyphosis, and posterior pelvic tilt  ACTIVITY TOLERANCE: Activity tolerance: good  FUNCTIONAL OUTCOME MEASURES: Fastening/unfastening 3 buttons: 31.08 sec Physical performance test:  PPT#2 (simulated eating) 14.79 & PPT#4 (donning/doffing jacket): 16 sec  COORDINATION: 9 Hole Peg test: Right: 29.03 sec; Left: 29.47 sec  UE ROM:  WFL  UE MMT:   WFL  SENSATION: WFL  MUSCLE TONE: WFL  COGNITION: Overall cognitive status: Within functional limits for tasks assessed  OBSERVATIONS: Bradykinesia  TODAY'S TREATMENT:                                                                                                                                N/A This date  PATIENT EDUCATION: Education details: OT Role and POC Person educated: Patient Education method: Explanation Education comprehension: verbalized understanding  HOME EXERCISE PROGRAM: None at this time  GOALS:   LONG TERM GOALS: Target date: 07/27/2023    Pt will be independent with PD specific HEP and schedule.  Baseline: not yet initiated Goal status: INITIAL  2.  Pt will write a short paragraph with no significant decrease in size and maintain 100% legibility.  Baseline: 90% legibility with mild decrease in size Goal status: INITIAL  3.  Pt will demonstrate increased ease with dressing as evidenced by decreasing PPT#4 (don/ doff jacket) to 15 secs or less.  Baseline: 16 secs with gown; 26 sec with Jacket on 06/05/2023 Goal status: INITIAL  4.  Pt will demonstrate improved fine motor coordination for ADLs as evidenced by decreasing 9 hole peg test score for each hand by 3 secs  Baseline: R - 29.03 secs; L - 29.47 secs Goal status: INITIAL   5.  Pt will demonstrate improved ease with fastening buttons as evidenced by decreasing 3 button/unbutton time by at least 3 seconds  Baseline: 31.08 seconds Goal status: INITIAL    ASSESSMENT:  CLINICAL IMPRESSION: Patient is a 77 y.o. male who was seen today for occupational therapy evaluation for PD. Hx also includes HLD, cellulitis, lymphedema, low back pain, and hearing loss. Patient currently presents below baseline level of functioning demonstrating functional deficits and impairments as noted below. Pt would benefit from skilled OT services in the outpatient setting to work on impairments as noted below to help pt return to PLOF as able.    PERFORMANCE DEFICITS: in functional skills including ADLs, IADLs, coordination, strength, Fine motor control, and UE functional use.  IMPAIRMENTS: are limiting patient from ADLs, IADLs, and leisure.   COMORBIDITIES:  may have co-morbidities  that  affects occupational performance. Patient will benefit from skilled OT to address above impairments and improve overall function.  MODIFICATION OR ASSISTANCE TO COMPLETE EVALUATION: Min-Moderate modification of tasks or assist with assess necessary to complete an evaluation.  OT OCCUPATIONAL PROFILE AND HISTORY: Problem focused assessment: Including review of records relating to presenting problem.  CLINICAL DECISION MAKING: LOW - limited treatment options, no task modification necessary  REHAB POTENTIAL: Good  EVALUATION COMPLEXITY: Low    PLAN:  OT FREQUENCY: 2x/week  OT DURATION: 4 weeks  PLANNED INTERVENTIONS: self care/ADL training, therapeutic exercise, therapeutic activity,  neuromuscular re-education, functional mobility training, patient/family education, and Re-evaluation  RECOMMENDED OTHER SERVICES: none at this time  CONSULTED AND AGREED WITH PLAN OF CARE: Patient  PLAN FOR NEXT SESSION: Review therapy binder and exercise schedule.    Delana Meyer, OT 06/26/2023, 5:27 PM

## 2023-07-03 ENCOUNTER — Ambulatory Visit: Payer: Medicare HMO | Admitting: Occupational Therapy

## 2023-07-03 ENCOUNTER — Ambulatory Visit: Payer: Medicare HMO | Admitting: Physical Therapy

## 2023-07-03 ENCOUNTER — Encounter: Payer: Self-pay | Admitting: Physical Therapy

## 2023-07-03 DIAGNOSIS — R29818 Other symptoms and signs involving the nervous system: Secondary | ICD-10-CM

## 2023-07-03 DIAGNOSIS — R2681 Unsteadiness on feet: Secondary | ICD-10-CM | POA: Diagnosis not present

## 2023-07-03 DIAGNOSIS — R278 Other lack of coordination: Secondary | ICD-10-CM | POA: Diagnosis not present

## 2023-07-03 DIAGNOSIS — R293 Abnormal posture: Secondary | ICD-10-CM | POA: Diagnosis not present

## 2023-07-03 DIAGNOSIS — M6281 Muscle weakness (generalized): Secondary | ICD-10-CM | POA: Diagnosis not present

## 2023-07-03 DIAGNOSIS — G20A1 Parkinson's disease without dyskinesia, without mention of fluctuations: Secondary | ICD-10-CM | POA: Diagnosis not present

## 2023-07-03 DIAGNOSIS — R2689 Other abnormalities of gait and mobility: Secondary | ICD-10-CM | POA: Diagnosis not present

## 2023-07-03 NOTE — Therapy (Signed)
OUTPATIENT OCCUPATIONAL THERAPY PARKINSON'S TREATMENT  Patient Name: [name] MRN: [id number] DOB:Jul 20, 1946, 77 y.o., male Today's Date: 07/03/2023  PCP: Excell Seltzer, MD  REFERRING PROVIDER: Ihor Austin, NP  END OF SESSION:  OT End of Session - 07/03/23 1019     Visit Number 2    Number of Visits 9    Date for OT Re-Evaluation 07/27/23    Authorization Type [employer] - auth approved    Authorization Time Period 06/26/2023 - 07/27/2023    Authorization - Visit Number 1    Authorization - Number of Visits 8    Progress Note Due on Visit 9    OT Start Time 1021    OT Stop Time 1100    OT Time Calculation (min) 39 min    Activity Tolerance Patient tolerated treatment well    Behavior During Therapy WFL for tasks assessed/performed             Past Medical History:  Diagnosis Date   Agatston coronary artery calcium score greater than 400    Coronary calcium score of 533. This was 69th percentile for age,   Allergy 15 yrs. or more   mosquito or wasp bites bad reaction   Aortic atherosclerosis (HCC)    Cataract 10 yrs. or more   slow bloom checkup every 6 mos.   Hyperlipidemia LDL goal <70    Neuromuscular disorder (HCC) 09/2018   Parkinsons   Parkinson disease    Sleep apnea 02/21/2021 sleep study   have Resair machine   Past Surgical History:  Procedure Laterality Date   TONSILLECTOMY     Patient Active Problem List   Diagnosis Date Noted   Agatston coronary artery calcium score greater than 400 02/02/2022   Aortic atherosclerosis (HCC) 02/01/2022   SVT (supraventricular tachycardia) 01/12/2022   OSA (obstructive sleep apnea) 01/12/2022   Parkinson's disease 11/25/2020   Edema of right lower leg 11/25/2020   Hyperlipidemia LDL goal <70 11/25/2020   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 He does complete some of his PWR! Moves but not all of them frequently.   Pt accompanied by: self  PERTINENT HISTORY: PD (dx 2019), HLD, cellulitis, lymphedema, low back pain, hearing loss   PRECAUTIONS: Fall  WEIGHT BEARING RESTRICTIONS: No  PAIN:  Are you having pain? No  FALLS: Has patient fallen in last 6 months? No  LIVING ENVIRONMENT: Lives with: lives with their spouse Lives in: House/apartment Stairs: Yes: External: 1 steps; none Has following equipment at home that he uses: Environmental consultant - 4 wheeled and strong arm cane ; CPAP; hearing aids  PLOF: Independent; retired professor; driving  PATIENT GOALS: improve writing and use of hands  OBJECTIVE:   HAND DOMINANCE: Right  ADLs: Overall ADLs: mod I  UB Dressing: A with buttons  IADLs: Mostly mod I Handwriting: 90% legible  MOBILITY STATUS: Needs Assist: uses Rollator with SBA  POSTURE COMMENTS:  rounded shoulders, forward head, decreased lumbar lordosis, increased thoracic kyphosis, and posterior pelvic tilt  ACTIVITY TOLERANCE: Activity  tolerance: good  FUNCTIONAL OUTCOME MEASURES: Fastening/unfastening 3 buttons: 31.08 sec Physical performance test: PPT#2 (simulated eating) 14.79 & PPT#4 (donning/doffing jacket): 16 sec  COORDINATION: 9 Hole Peg test: Right: 29.03 sec; Left: 29.47 sec  UE ROM:  WFL  UE MMT:   WFL  SENSATION: WFL  MUSCLE TONE: WFL  COGNITION: Overall cognitive status: Within functional limits for tasks assessed  OBSERVATIONS: Bradykinesia  TODAY'S TREATMENT:                                                                                                                                OT helped arrange PD Binder with HEP and created weekly chart as noted in pt instructions.   OT reviewed handwriting strategies and provided pt with tracing paper for letters and numbers.   PATIENT EDUCATION: Education details: PD exercises; writing strategies Person educated: Patient Education method: Explanation Education comprehension: verbalized understanding  HOME EXERCISE PROGRAM: 07/03/2023: exercise chart, zoom classes, handwriting strategies  GOALS:   LONG TERM GOALS: Target date: 07/27/2023    Pt will be independent with PD specific HEP and schedule.  Baseline: not yet initiated Goal status: INITIAL  2.  Pt will write a short paragraph with no significant decrease in size and maintain 100% legibility.  Baseline: 90% legibility with mild decrease in size Goal status: INITIAL  3.  Pt will demonstrate increased ease with dressing as evidenced by decreasing PPT#4 (don/ doff jacket) to 15 secs or less.  Baseline: 16 secs with gown; 26 sec with Jacket on 06/05/2023 Goal status: INITIAL  4.  Pt will demonstrate improved fine motor coordination for ADLs as evidenced by decreasing 9 hole peg test score for each hand by 3 secs  Baseline: R - 29.03 secs; L - 29.47 secs Goal status: INITIAL   5.  Pt will demonstrate improved ease with fastening buttons as evidenced by decreasing 3 button/unbutton time by at least 3 seconds  Baseline: 31.08 seconds Goal status: INITIAL    ASSESSMENT:  CLINICAL IMPRESSION: Patient verbalizes understanding of PD Exercises and handwriting strategies as needed to progress towards goals. Would benefit from review of PWR! Moves during future visit.   PERFORMANCE DEFICITS: in functional skills including ADLs, IADLs, coordination, strength, Fine motor control, and UE functional use.  IMPAIRMENTS: are limiting patient from ADLs, IADLs, and leisure.    COMORBIDITIES:  may have co-morbidities  that affects occupational performance. Patient will benefit from skilled OT to address above impairments and improve overall function.  REHAB POTENTIAL: Good  PLAN:  OT FREQUENCY: 2x/week  OT DURATION: 4 weeks  PLANNED INTERVENTIONS: self care/ADL training, therapeutic exercise, therapeutic activity, neuromuscular re-education, functional mobility training, patient/family education, and Re-evaluation  RECOMMENDED OTHER SERVICES: none at this time  CONSULTED AND AGREED WITH PLAN OF CARE: Patient  PLAN FOR NEXT SESSION: Review PWR! moves.    Delana Meyer, OT 07/03/2023, 10:23 AM

## 2023-07-03 NOTE — Patient Instructions (Addendum)
Zoom-based Classes for Parkinson's  My list: PWR! Virtual Experience [email or web address];   [email or web address];   The Daily Dose [email or web address]   (Exercise) Monday Tuesday Wednesday Thursday Friday Saturday Sunday   Coordination           PWR! Hands           PWR! Supine          PWR! Sitting          PWR! Standing          Buttons           [employer] Exercises         Suggestions for Handwriting Changes Many people with Parkinson's notice changes in their handwriting.  Handwriting often becomes small and cramped, and can become more difficult to control when writing for longer periods of time.  This handwriting change is called micrographia.  Why does micrographia occur?  Parkinson's can cause slowing of movement, and feelings of muscle stiffness in the hands and fingers.  Loss of automatic motion also affects the easy, flowing motion of handwriting.  This can impact even simple writing tasks such as signing your name or writing a shopping list.  Attempts to write quickly without thinking about forming each letter contributes to small, cramped handwriting, and may cause the hand to develop a feeling of tightness.  How can I make writing easier?  Make a deliberate effort to form each letter.  This can be hard to do at first, but is very effective in improving size and legibility of handwriting.  Use a pen grip (round or triangular shaped rubber or foam cylinders available at stationary stores or where writing materials are found) or a larger size pen to keep your hand more relaxed.  Try printing rather than writing in a cursive style. Printing causes you to pause briefly between each letter, keeping writing more legible.   Using lined paper may provide a "visual target" to keep all letters big when writing.   A ballpoint pen typically works  better than felt tip or "rolling writer"/gel styles.  Rest your hand if it begins to feel "tight".  Pause briefly when you see your handwriting becoming smaller.  Avoid hurrying or trying to write long passages if you are feeling stressed or fatigued.  Practice helps!  Remind yourself to slow down, aim big, and pause often!  Perform "flicks"/PWR! Hands if your hand feels tight, your writing gets smaller, before you start writing, or if tremors increase.  Involving your team: An occupational therapist can provide assessment and individual recommendations for improvement of your handwriting.  This handout was adapted from [email or web address] [employer]

## 2023-07-03 NOTE — Therapy (Signed)
OUTPATIENT PHYSICAL THERAPY NEURO TREATMENT   Patient Name: [name] MRN: [id number] DOB:Apr 12, 1946, 77 y.o., male Today's Date: 07/03/2023   PCP: Excell Seltzer, MD   REFERRING PROVIDER: Ihor Austin, NP  END OF SESSION:  PT End of Session - 07/03/23 1104     Visit Number 2    Number of Visits 9    Date for PT Re-Evaluation 07/27/23    Authorization Type [employer]    PT Start Time 1102    PT Stop Time 1142    PT Time Calculation (min) 40 min    Equipment Utilized During Treatment Gait belt    Activity Tolerance Patient tolerated treatment well    Behavior During Therapy WFL for tasks assessed/performed                 Past Medical History:  Diagnosis Date   Agatston coronary artery calcium score greater than 400    Coronary calcium score of 533. This was 69th percentile for age,   Allergy 15 yrs. or more   mosquito or wasp bites bad reaction   Aortic atherosclerosis (HCC)    Cataract 10 yrs. or more   slow bloom checkup every 6 mos.   Hyperlipidemia LDL goal <70    Neuromuscular disorder (HCC) 09/2018   Parkinsons   Parkinson disease    Sleep apnea 02/21/2021 sleep study   have Resair machine   Past Surgical History:  Procedure Laterality Date   TONSILLECTOMY     Patient Active Problem List   Diagnosis Date Noted   Agatston coronary artery calcium score greater than 400 02/02/2022   Aortic atherosclerosis (HCC) 02/01/2022   SVT (supraventricular tachycardia) 01/12/2022   OSA (obstructive sleep apnea) 01/12/2022   Parkinson's disease 11/25/2020   Edema of right lower leg 11/25/2020   Hyperlipidemia LDL goal <70 11/25/2020   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   Hearing loss 11/25/2020   Pityriasis rosea 11/25/2020    ONSET DATE: 06/05/2023  REFERRING DIAG: G20.A1 (ICD-10-CM) - Parkinson's disease without dyskinesia or fluctuating manifestations  THERAPY DIAG:  Other lack of coordination  Other symptoms and signs involving the  nervous system  Abnormal posture  Muscle weakness (generalized)  Rationale for Evaluation and Treatment: Rehabilitation  SUBJECTIVE:                                                                                                                                                                                             SUBJECTIVE STATEMENT: No changes since he was last here. No falls. Celebrated his 44th wedding anniversary over the weekend  Pt accompanied by: self  PERTINENT HISTORY: PD (dx 2019), HLD, cellulitis, lymphedema, low back pain, hearing loss   PAIN:  Are you having pain? No  PRECAUTIONS: Fall  WEIGHT BEARING RESTRICTIONS: No  FALLS: Has patient fallen in last 6 months? No  LIVING ENVIRONMENT: Lives with: lives with their spouse Lives in: House/apartment Stairs: No, only 1 step to enter in the front.  Has following equipment at home: Dan Humphreys - 2 wheeled, Environmental consultant - 4 wheeled, and Strong Arm [place], Walk in shower   PLOF: Independent with community mobility with device  PATIENT GOALS: Wants to keep working on getting on and off the floor, work on strengthening the legs.   OBJECTIVE:   COGNITION: Overall cognitive status: Within functional limits for tasks assessed   SENSATION: Numbness/tingling in legs only if he sits for too long, otherwise WNL   COORDINATION: Heel to shin: WNL   OBSERVATIONS: Swelling in bilat ankles (pt reports taking Lasix), sometimes won't take it when coming to therapy or unfamiliar environment  POSTURE: rounded shoulders, forward head, increased thoracic kyphosis, and flexed trunk    LOWER EXTREMITY MMT:    MMT Right Eval Left Eval  Hip flexion 4 4+  Hip extension    Hip abduction 4 5  Hip adduction 5 5  Hip internal rotation    Hip external rotation    Knee flexion 5 5  Knee extension 5 5  Ankle dorsiflexion 5 4  Ankle plantarflexion    Ankle inversion    Ankle eversion    (Blank rows = not tested)  All tested in  sitting   BED MOBILITY:  Pt reports independence, no difficulties   TRANSFERS: Assistive device utilized: None  Sit to stand: SBA Stand to sit: SBA Can perform without UE support, incr forward flexed posture in standing   GAIT: Gait pattern: step through pattern, decreased arm swing- Left, decreased stride length, decreased hip/knee flexion- Right, decreased hip/knee flexion- Left, Right foot flat, Left foot flat, lateral hip instability, and trunk flexed Distance walked: clinic distances  Assistive device utilized: Environmental consultant - 4 wheeled and Strong Arm Cane Level of assistance: SBA and CGA Comments: pt pushes rollator too far anteriorly with incr forward flexed posture during gait, noted some scissoring with RLE during gait, CGA at times when turning with cane   FUNCTIONAL TESTS:  5 times sit to stand: 22.9 seconds with no UE support  30 seconds chair stand test: 6 sit <> stands with no UE support, 6.5/10 RPE     M-CTSIB  Condition 1: Firm Surface, EO 30 Sec, Normal Sway  Condition 2: Firm Surface, EC 8.5 Sec, Mod Sway  Condition 3: Foam Surface, EO 30 Sec, Mild/Mod Sway  Condition 4: Foam Surface, EC 1.5 Sec        TODAY'S TREATMENT:  DATE: 07/03/23  Pt brought in exercise binder from home, pt has a lot of exercises for PT/OT, discussed may work on condensing exercises and pt to go through his binder and remove exercises that he has not been doing   NMR: Reviewed push up away from wall from HEP (per pt request), performed x10 reps with cues to be a little closer to the wall and to push off with tall posture, pt initially needing min guard for balance after pushing away, but pt improved with incr reps. Cues to hold for a few seconds with upright posture and hip extension  Standing on air ex: Working on hip strategy wall bumps x10 reps, pt with incr  difficulty with shifting weight forwards and performing hip extension away from the wall, needing a few standing rest breaks when performing  Standing tall against wall and holding ball into shoulder flexion overhead x10 reps for posture, holding overhead for a few seconds  At countertop: [employer] x10 reps with cues to reach up as high as he can and look up at hands, then lifting up contralateral leg for dynamic SLS x10 reps, with pt needing UE support   Therapeutic Exercise: Thomas test stretch for hip flexors over mat table 2 sets of 1 minute bilaterally, pt reporting feeling good with this stretch   PATIENT EDUCATION: Education details: Addition of hip flexor stretch to HEP, discussed will work on condensing HEP as needed going forwards as pt has a good amount of exercises  Person educated: Patient Education method: Explanation, Demonstration, Verbal cues, and Handouts Education comprehension: verbalized understanding and returned demonstration  HOME EXERCISE PROGRAM: [email or web address]  (from previous bout of therapy, will review/update as needed)  GOALS: Goals reviewed with patient? Yes  SHORT TERM GOALS: ALL STGS =LTGS  LONG TERM GOALS: Target date: 07/25/2023  Pt will be independent with final PD specific HEP with wife's supervision in order to build upon functional gains made in therapy.  Baseline:  Goal status: INITIAL  2.   Pt will perform floor transfers with SBA for improved balance recovery.  Baseline:  Goal status: INITIAL  3.  Pt will hold condition 2 of mCTSIB for at least 20 seconds for improved balance with EC   Baseline: 8.5 seconds Goal status: INITIAL  4.  Pt will improve TUG time with Strong Arm Cane to 14  seconds or less in order to demo decrease fall risk.  Baseline: 15.7 seconds  Goal status: INITIAL  5.  Pt will improve cog TUG time with Strong Arm Cane to 16 seconds or less in order to demo improved dual tasking.  Baseline: 18.34 seconds Goal status:  INITIAL  6.  Pt will improve gait speed with Strong Arm Cane to at least 2.3 ft/sec in order to demo improved community mobility.   Baseline: 2.13 ft/sec  Goal status: INITIAL  7.  Pt will perform 30 second chair stand with no UE support with 7 sit <> stands and RPE at 5/10 or less in order to demo improved strength/endurance.   Baseline: 6 sit <> stands with no UE support, 6.5/10 RPE  Goal status: INITIAL  ASSESSMENT:  CLINICAL IMPRESSION: Today's skilled session focused on exercises for balance and posture. Reviewed push up away from wall for HEP (per pt request), with pt initially needing CGA for balance. Discussed when performing this at home, making sure that pt has something behind him for safety. Pt challenged by hip strategy away from wall into hip extension and fatigued more  easily with this. Added thomas test hip flexor stretch for HEP as pt does not have a hip flexor stretch for home. Will continue per POC.   OBJECTIVE IMPAIRMENTS: Abnormal gait, decreased activity tolerance, decreased balance, decreased coordination, decreased mobility, difficulty walking, decreased strength, impaired flexibility, and postural dysfunction.   ACTIVITY LIMITATIONS: lifting, bending, squatting, stairs, transfers, and locomotion level  PARTICIPATION LIMITATIONS: community activity and yard work  PERSONAL FACTORS: Age, Past/current experiences, Time since onset of injury/illness/exacerbation, and 3+ comorbidities: PD (dx 2019), HLD, cellulitis, lymphedema, low back pain, hearing loss   are also affecting patient's functional outcome.   REHAB POTENTIAL: Good  CLINICAL DECISION MAKING: Stable/uncomplicated  EVALUATION COMPLEXITY: Low  PLAN:  PT FREQUENCY: 2x/week  PT DURATION: 4 weeks  PLANNED INTERVENTIONS: Therapeutic exercises, Therapeutic activity, Neuromuscular re-education, Balance training, Gait training, Patient/Family education, Self Care, Joint mobilization, Stair training,  Vestibular training, DME instructions, Manual therapy, and Re-evaluation  PLAN FOR NEXT SESSION: Review previous HEP as needed and condense as able as pt has a lot of exercises Work on R hip strengthening, balance with EC, unlevel surfaces, dual tasking, posture exercises    Drake Leach, PT, DPT 07/03/2023, 11:45 AM

## 2023-07-04 NOTE — Therapy (Unsigned)
OUTPATIENT OCCUPATIONAL THERAPY PARKINSON'S TREATMENT  Patient Name: [name] MRN: [id number] DOB:1946/06/24, 77 y.o., male Today's Date: 07/05/2023  PCP: Excell Seltzer, MD  REFERRING PROVIDER: Ihor Austin, NP  END OF SESSION:  OT End of Session - 07/05/23 1405     Visit Number 3    Number of Visits 9    Date for OT Re-Evaluation 07/27/23    Authorization Type [employer] - auth approved    Authorization Time Period 06/26/2023 - 07/27/2023    Authorization - Number of Visits 8    Progress Note Due on Visit 9    OT Start Time 1404    OT Stop Time 1444    OT Time Calculation (min) 40 min    Activity Tolerance Patient tolerated treatment well    Behavior During Therapy WFL for tasks assessed/performed              Past Medical History:  Diagnosis Date   Agatston coronary artery calcium score greater than 400    Coronary calcium score of 533. This was 69th percentile for age,   Allergy 15 yrs. or more   mosquito or wasp bites bad reaction   Aortic atherosclerosis (HCC)    Cataract 10 yrs. or more   slow bloom checkup every 6 mos.   Hyperlipidemia LDL goal <70    Neuromuscular disorder (HCC) 09/2018   Parkinsons   Parkinson disease    Sleep apnea 02/21/2021 sleep study   have Resair machine   Past Surgical History:  Procedure Laterality Date   TONSILLECTOMY     Patient Active Problem List   Diagnosis Date Noted   Agatston coronary artery calcium score greater than 400 02/02/2022   Aortic atherosclerosis (HCC) 02/01/2022   SVT (supraventricular tachycardia) 01/12/2022   OSA (obstructive sleep apnea) 01/12/2022   Parkinson's disease 11/25/2020   Edema of right lower leg 11/25/2020   Hyperlipidemia LDL goal <70 11/25/2020   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 He doesn't remember the last time he wrote out music by hand.   Pt accompanied by: self  PERTINENT HISTORY: PD (dx 2019), HLD, cellulitis, lymphedema, low back pain, hearing loss   PRECAUTIONS: Fall  WEIGHT BEARING RESTRICTIONS: No  PAIN:  Are you having pain? No  FALLS: Has patient fallen in last 6 months? No  LIVING ENVIRONMENT: Lives with: lives with their spouse Lives in: House/apartment Stairs: Yes: External: 1 steps; none Has following equipment at home that he uses: Environmental consultant - 4 wheeled and strong arm cane ; CPAP; hearing aids  PLOF: Independent; retired professor; driving  PATIENT GOALS: improve writing and use of hands  OBJECTIVE:   HAND DOMINANCE: Right  ADLs: Overall ADLs: mod I  UB Dressing: A with buttons  IADLs: Mostly mod I Handwriting: 90% legible  MOBILITY STATUS: Needs Assist: uses Rollator with SBA  POSTURE COMMENTS:  rounded shoulders, forward head, decreased lumbar lordosis, increased thoracic kyphosis, and posterior pelvic tilt  ACTIVITY TOLERANCE: Activity tolerance: good  FUNCTIONAL OUTCOME MEASURES: Fastening/unfastening 3 buttons: 31.08 sec Physical performance test: PPT#2 (simulated eating) 14.79 & PPT#4 (donning/doffing jacket): 16 sec  COORDINATION: 9 Hole Peg test: Right: 29.03 sec; Left: 29.47 sec  UE ROM:  WFL  UE MMT:   WFL  SENSATION: WFL  MUSCLE TONE: WFL  COGNITION: Overall cognitive status: Within functional limits for tasks assessed  OBSERVATIONS: Bradykinesia  TODAY'S TREATMENT:                                                                                                                                OT reviewed coordination HEP as noted in pt instructions. Pt required min cueing for proper completion. OT shared [email or web address] website with pt for typing practice.   OT reviewed PWR! Hands HEP as noted in pt instructions. Pt required min cueing for proper completion. Color handout with pictures also provided as noted in pt instructions.   OT reviewed handwriting strategies as pt transcribed sheet music. Slight difficulty with note identification and quality of notes.  OT reviewed buttoning strategies including "Angry" buttons with noted improvement to buttoning time following instruction and demonstration from therapist.   PATIENT EDUCATION: Education details: PD exercises; writing strategies Person educated: Patient Education method: Explanation Education comprehension: verbalized understanding  HOME EXERCISE PROGRAM: 07/05/2023: exercise chart, zoom classes, handwriting strategies  GOALS:   LONG TERM GOALS: Target date: 07/27/2023    Pt will be independent with PD specific HEP and schedule.  Baseline: not yet initiated Goal status: INITIAL  2.  Pt will write a short paragraph with no significant decrease in size and maintain 100% legibility.  Baseline: 90% legibility with mild decrease in size Goal status: INITIAL  3.  Pt will demonstrate increased ease with dressing as evidenced by decreasing PPT#4 (don/ doff jacket) to 15 secs or less.  Baseline: 16 secs with gown; 26 sec with Jacket on 06/05/2023 Goal status: INITIAL  4.  Pt will demonstrate improved fine motor coordination for ADLs as evidenced by decreasing 9 hole peg test score for each hand by 3 secs  Baseline: R - 29.03 secs; L - 29.47 secs Goal status: INITIAL   5.  Pt will demonstrate improved ease with fastening buttons as evidenced by decreasing 3 button/unbutton time by at least 3 seconds 07/05/2023: with instruction - 23 seconds Baseline:  31.08 seconds Goal status: INITIAL    ASSESSMENT:  CLINICAL IMPRESSION: Patient demonstrates good understanding of PD Exercises and handwriting strategies as needed to progress towards goals. Would benefit from review of other PWR! Moves during future visit.   PERFORMANCE DEFICITS: in functional skills including ADLs, IADLs, coordination, strength, Fine motor control, and UE functional use.  IMPAIRMENTS: are limiting patient from ADLs, IADLs, and leisure.   COMORBIDITIES:  may have co-morbidities  that affects occupational performance. Patient will benefit from skilled OT to address above impairments and improve overall function.  REHAB POTENTIAL: Good  PLAN:  OT FREQUENCY: 2x/week  OT DURATION:  4 weeks  PLANNED INTERVENTIONS: self care/ADL training, therapeutic exercise, therapeutic activity, neuromuscular re-education, functional mobility training, patient/family education, and Re-evaluation  RECOMMENDED OTHER SERVICES: none at this time  CONSULTED AND AGREED WITH PLAN OF CARE: Patient  PLAN FOR NEXT SESSION: Review additional PWR! moves.    Delana Meyer, OT 07/05/2023, 3:00 PM

## 2023-07-05 ENCOUNTER — Ambulatory Visit: Payer: Medicare HMO | Admitting: Occupational Therapy

## 2023-07-05 ENCOUNTER — Ambulatory Visit: Payer: Medicare HMO | Admitting: Physical Therapy

## 2023-07-05 DIAGNOSIS — R29818 Other symptoms and signs involving the nervous system: Secondary | ICD-10-CM | POA: Diagnosis not present

## 2023-07-05 DIAGNOSIS — G20A1 Parkinson's disease without dyskinesia, without mention of fluctuations: Secondary | ICD-10-CM | POA: Diagnosis not present

## 2023-07-05 DIAGNOSIS — R2681 Unsteadiness on feet: Secondary | ICD-10-CM | POA: Diagnosis not present

## 2023-07-05 DIAGNOSIS — M6281 Muscle weakness (generalized): Secondary | ICD-10-CM | POA: Diagnosis not present

## 2023-07-05 DIAGNOSIS — R2689 Other abnormalities of gait and mobility: Secondary | ICD-10-CM

## 2023-07-05 DIAGNOSIS — R278 Other lack of coordination: Secondary | ICD-10-CM

## 2023-07-05 DIAGNOSIS — R293 Abnormal posture: Secondary | ICD-10-CM

## 2023-07-05 NOTE — Therapy (Signed)
OUTPATIENT PHYSICAL THERAPY NEURO TREATMENT   Patient Name: [name] MRN: [id number] DOB:04-Nov-1946, 77 y.o., male Today's Date: 07/05/2023   PCP: Excell Seltzer, MD   REFERRING PROVIDER: Ihor Austin, NP  END OF SESSION:  PT End of Session - 07/05/23 1452     Visit Number 3    Number of Visits 9    Date for PT Re-Evaluation 07/27/23    Authorization Type [employer]    PT Start Time 1450    PT Stop Time 1530    PT Time Calculation (min) 40 min    Equipment Utilized During Treatment Gait belt    Activity Tolerance Patient tolerated treatment well    Behavior During Therapy WFL for tasks assessed/performed                 Past Medical History:  Diagnosis Date   Agatston coronary artery calcium score greater than 400    Coronary calcium score of 533. This was 69th percentile for age,   Allergy 15 yrs. or more   mosquito or wasp bites bad reaction   Aortic atherosclerosis (HCC)    Cataract 10 yrs. or more   slow bloom checkup every 6 mos.   Hyperlipidemia LDL goal <70    Neuromuscular disorder (HCC) 09/2018   Parkinsons   Parkinson disease    Sleep apnea 02/21/2021 sleep study   have Resair machine   Past Surgical History:  Procedure Laterality Date   TONSILLECTOMY     Patient Active Problem List   Diagnosis Date Noted   Agatston coronary artery calcium score greater than 400 02/02/2022   Aortic atherosclerosis (HCC) 02/01/2022   SVT (supraventricular tachycardia) 01/12/2022   OSA (obstructive sleep apnea) 01/12/2022   Parkinson's disease 11/25/2020   Edema of right lower leg 11/25/2020   Hyperlipidemia LDL goal <70 11/25/2020   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   Hearing loss 11/25/2020   Pityriasis rosea 11/25/2020    ONSET DATE: 06/05/2023  REFERRING DIAG: G20.A1 (ICD-10-CM) - Parkinson's disease without dyskinesia or fluctuating manifestations  THERAPY DIAG:  Muscle weakness (generalized)  Abnormal posture  Unsteadiness on  feet  Other abnormalities of gait and mobility  Rationale for Evaluation and Treatment: Rehabilitation  SUBJECTIVE:                                                                                                                                                                                             SUBJECTIVE STATEMENT: No changes since he was last here. No falls. Brought his exercises to revise today.   Pt accompanied by: self  PERTINENT HISTORY: PD (dx  2019), HLD, cellulitis, lymphedema, low back pain, hearing loss   PAIN:  Are you having pain? No  PRECAUTIONS: Fall  WEIGHT BEARING RESTRICTIONS: No  FALLS: Has patient fallen in last 6 months? No  LIVING ENVIRONMENT: Lives with: lives with their spouse Lives in: House/apartment Stairs: No, only 1 step to enter in the front.  Has following equipment at home: Dan Humphreys - 2 wheeled, Environmental consultant - 4 wheeled, and Strong Arm Collins, Walk in shower   PLOF: Independent with community mobility with device  PATIENT GOALS: Wants to keep working on getting on and off the floor, work on strengthening the legs.   OBJECTIVE:   COGNITION: Overall cognitive status: Within functional limits for tasks assessed   SENSATION: Numbness/tingling in legs only if he sits for too long, otherwise WNL   COORDINATION: Heel to shin: WNL   OBSERVATIONS: Swelling in bilat ankles (pt reports taking Lasix), sometimes won't take it when coming to therapy or unfamiliar environment  POSTURE: rounded shoulders, forward head, increased thoracic kyphosis, and flexed trunk    LOWER EXTREMITY MMT:    MMT Right Eval Left Eval  Hip flexion 4 4+  Hip extension    Hip abduction 4 5  Hip adduction 5 5  Hip internal rotation    Hip external rotation    Knee flexion 5 5  Knee extension 5 5  Ankle dorsiflexion 5 4  Ankle plantarflexion    Ankle inversion    Ankle eversion    (Blank rows = not tested)  All tested in sitting   BED MOBILITY:  Pt  reports independence, no difficulties   TRANSFERS: Assistive device utilized: None  Sit to stand: SBA Stand to sit: SBA Can perform without UE support, incr forward flexed posture in standing   GAIT: Gait pattern: step through pattern, decreased arm swing- Left, decreased stride length, decreased hip/knee flexion- Right, decreased hip/knee flexion- Left, Right foot flat, Left foot flat, lateral hip instability, and trunk flexed Distance walked: clinic distances  Assistive device utilized: Environmental consultant - 4 wheeled and Strong Arm Cane Level of assistance: SBA and CGA Comments: pt pushes rollator too far anteriorly with incr forward flexed posture during gait, noted some scissoring with RLE during gait, CGA at times when turning with cane   FUNCTIONAL TESTS:  5 times sit to stand: 22.9 seconds with no UE support  30 seconds chair stand test: 6 sit <> stands with no UE support, 6.5/10 RPE     M-CTSIB  Condition 1: Firm Surface, EO 30 Sec, Normal Sway  Condition 2: Firm Surface, EC 8.5 Sec, Mod Sway  Condition 3: Foam Surface, EO 30 Sec, Mild/Mod Sway  Condition 4: Foam Surface, EC 1.5 Sec        TODAY'S TREATMENT:                                                                                                                              DATE: 07/05/23 Ther  Act  Reviewed pt's HEP and attempted to make modifications, but at this time, pt has several exercises targeting different impairments (posture, PD, ankle pain, vestibular input, balance) and therapist unable to eliminate any. Pt reports he is performing all of them intermittently and likes the variety. Will reassess at end of POC to determine if his HEP can be reduced.   NMR With back against wall and standing on airex for improved hip extension strength, ankle stability and postural control:  Wall bumps, x10 reps. Pt reported this being easy, so progressed to wall bump in mini squat position to standing tall w/arms extended [facility] for added  balance challenge, x10 reps. Pt very challenged by this, frequently losing balance posteriorly or to L side. CGA throughout. Attempted to add to HEP, but unable to find proper photo of movement. Pt to purchase foam pad for home use (therapist approved which one) and states he will perform at home and does not need handout.  BUE shoulder flexion w/purple ball and 5s isometric hold, x10 reps. Pt performed well w/2-3 instances of posterior LOB.  Serratus anterior activation at wall using black foam roller, x8 reps, for improved posture and periscapular strength. Pt unable to maintain cervical extension throughout but did have minor periscapular muscle activation w/use of tactile and verbal cues.     PATIENT EDUCATION: Education details: Will condense HEP as we progress through POC, verbally added wall bumps w/BUE flexion  to HEP Person educated: Patient Education method: Explanation, Demonstration, Verbal cues, and Handouts Education comprehension: verbalized understanding and returned demonstration  HOME EXERCISE PROGRAM: [id number]  (from previous bout of therapy, will review/update as needed)  verbally added wall bumps w/BUE flexion on 8/22  GOALS: Goals reviewed with patient? Yes  SHORT TERM GOALS: ALL STGS =LTGS  LONG TERM GOALS: Target date: 07/25/2023  Pt will be independent with final PD specific HEP with wife's supervision in order to build upon functional gains made in therapy.  Baseline:  Goal status: INITIAL  2.   Pt will perform floor transfers with SBA for improved balance recovery.  Baseline:  Goal status: INITIAL  3.  Pt will hold condition 2 of mCTSIB for at least 20 seconds for improved balance with EC   Baseline: 8.5 seconds Goal status: INITIAL  4.  Pt will improve TUG time with Strong Arm Cane to 14  seconds or less in order to demo decrease fall risk.  Baseline: 15.7 seconds  Goal status: INITIAL  5.  Pt will improve cog TUG time with Strong Arm Cane to 16  seconds or less in order to demo improved dual tasking.  Baseline: 18.34 seconds Goal status: INITIAL  6.  Pt will improve gait speed with Strong Arm Cane to at least 2.3 ft/sec in order to demo improved community mobility.   Baseline: 2.13 ft/sec  Goal status: INITIAL  7.  Pt will perform 30 second chair stand with no UE support with 7 sit <> stands and RPE at 5/10 or less in order to demo improved strength/endurance.   Baseline: 6 sit <> stands with no UE support, 6.5/10 RPE  Goal status: INITIAL  ASSESSMENT:  CLINICAL IMPRESSION: Emphasis of skilled PT session on reviewing HEP, improved postural control and dynamic standing balance. Unable to condense HEP this date, as pt reports he performs all exercises and none are redundant. Reviewed exercises performed in previous session per pt request and attempted to provide handout w/these exercises for HEP, but unable to find photos so verbally added  to HEP instead w/pt demonstrating good understanding. Continue POC.    OBJECTIVE IMPAIRMENTS: Abnormal gait, decreased activity tolerance, decreased balance, decreased coordination, decreased mobility, difficulty walking, decreased strength, impaired flexibility, and postural dysfunction.   ACTIVITY LIMITATIONS: lifting, bending, squatting, stairs, transfers, and locomotion level  PARTICIPATION LIMITATIONS: community activity and yard work  PERSONAL FACTORS: Age, Past/current experiences, Time since onset of injury/illness/exacerbation, and 3+ comorbidities: PD (dx 2019), HLD, cellulitis, lymphedema, low back pain, hearing loss   are also affecting patient's functional outcome.   REHAB POTENTIAL: Good  CLINICAL DECISION MAKING: Stable/uncomplicated  EVALUATION COMPLEXITY: Low  PLAN:  PT FREQUENCY: 2x/week  PT DURATION: 4 weeks  PLANNED INTERVENTIONS: Therapeutic exercises, Therapeutic activity, Neuromuscular re-education, Balance training, Gait training, Patient/Family education, Self  Care, Joint mobilization, Stair training, Vestibular training, DME instructions, Manual therapy, and Re-evaluation  PLAN FOR NEXT SESSION: [name] failed at condensing HEP. Review previous HEP as needed and condense as able as pt has a lot of exercises Work on R hip strengthening, balance with EC, unlevel surfaces, dual tasking, posture exercises, reaching for blaze pods    [name], PT, DPT 07/05/2023, 3:31 PM

## 2023-07-10 ENCOUNTER — Encounter: Payer: Self-pay | Admitting: Physical Therapy

## 2023-07-10 ENCOUNTER — Ambulatory Visit: Payer: Medicare HMO | Admitting: Occupational Therapy

## 2023-07-10 ENCOUNTER — Ambulatory Visit: Payer: Medicare HMO | Admitting: Physical Therapy

## 2023-07-10 DIAGNOSIS — R293 Abnormal posture: Secondary | ICD-10-CM

## 2023-07-10 DIAGNOSIS — R2689 Other abnormalities of gait and mobility: Secondary | ICD-10-CM | POA: Diagnosis not present

## 2023-07-10 DIAGNOSIS — R278 Other lack of coordination: Secondary | ICD-10-CM

## 2023-07-10 DIAGNOSIS — G20A1 Parkinson's disease without dyskinesia, without mention of fluctuations: Secondary | ICD-10-CM | POA: Diagnosis not present

## 2023-07-10 DIAGNOSIS — M6281 Muscle weakness (generalized): Secondary | ICD-10-CM

## 2023-07-10 DIAGNOSIS — R29818 Other symptoms and signs involving the nervous system: Secondary | ICD-10-CM | POA: Diagnosis not present

## 2023-07-10 DIAGNOSIS — R2681 Unsteadiness on feet: Secondary | ICD-10-CM | POA: Diagnosis not present

## 2023-07-10 NOTE — Patient Instructions (Addendum)
Access Code: [email or web address] URL: [email or web address] Date: 07/10/2023 Prepared by: Amada Kingfisher  Exercises - Putty Squeezes  - 1 x daily - 10 reps - Rolling Putty on Table  - 1 x daily - 10 reps - Finger Pinch and Pull with Putty  - 1 x daily - 10 reps - 3-Point Pinch with Putty  - 1 x daily - 10 reps - Tip PUSH with Putty  - 1 x daily - 10 reps - Key Pinch with Putty  - 1 x daily - 10 reps - Finger Extension with Putty  - 1 x daily - 10 reps

## 2023-07-10 NOTE — Therapy (Signed)
OUTPATIENT PHYSICAL THERAPY NEURO TREATMENT   Patient Name: [name] MRN: [id number] DOB:[birth year], 77 y.o., male Today's Date: 07/11/2023   PCP: Excell Seltzer, MD   REFERRING PROVIDER: Ihor Austin, NP  END OF SESSION:  PT End of Session - 07/10/23 1404     Visit Number 4    Number of Visits 9    Date for PT Re-Evaluation 07/27/23    Authorization Type [employer]    PT Start Time 1403    PT Stop Time 1443    PT Time Calculation (min) 40 min    Equipment Utilized During Treatment Gait belt    Activity Tolerance Patient tolerated treatment well    Behavior During Therapy WFL for tasks assessed/performed                 Past Medical History:  Diagnosis Date   Agatston coronary artery calcium score greater than 400    Coronary calcium score of 533. This was 69th percentile for age,   Allergy 15 yrs. or more   mosquito or wasp bites bad reaction   Aortic atherosclerosis (HCC)    Cataract 10 yrs. or more   slow bloom checkup every 6 mos.   Hyperlipidemia LDL goal <70    Neuromuscular disorder (HCC) 09/2018   Parkinsons   Parkinson disease    Sleep apnea 02/21/2021 sleep study   have Resair machine   Past Surgical History:  Procedure Laterality Date   TONSILLECTOMY     Patient Active Problem List   Diagnosis Date Noted   Agatston coronary artery calcium score greater than 400 02/02/2022   Aortic atherosclerosis (HCC) 02/01/2022   SVT (supraventricular tachycardia) 01/12/2022   OSA (obstructive sleep apnea) 01/12/2022   Parkinson's disease 11/25/2020   Edema of right lower leg 11/25/2020   Hyperlipidemia LDL goal <70 11/25/2020   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 Cut up 15 lbs of cucumbers yesterday to make pickles. His balance pad should be coming in tonight.   Pt accompanied by: self  PERTINENT HISTORY: PD (dx  2019), HLD, cellulitis, lymphedema, low back pain, hearing loss   PAIN:  Are you having pain? No  PRECAUTIONS: Fall  WEIGHT BEARING RESTRICTIONS: No  FALLS: Has patient fallen in last 6 months? No  LIVING ENVIRONMENT: Lives with: lives with their spouse Lives in: House/apartment Stairs: No, only 1 step to enter in the front.  Has following equipment at home: Dan Humphreys - 2 wheeled, Environmental consultant - 4 wheeled, and Strong Arm [place], Walk in shower   PLOF: Independent with community mobility with device  PATIENT GOALS: Wants to keep working on getting on and off the floor, work on strengthening the legs.   OBJECTIVE:   COGNITION: Overall cognitive status: Within functional limits for tasks assessed   SENSATION: Numbness/tingling in legs only if he sits for too long, otherwise WNL   COORDINATION: Heel to shin: WNL   OBSERVATIONS: Swelling in bilat ankles (pt reports taking Lasix), sometimes won't take it when coming to therapy or unfamiliar environment  POSTURE: rounded shoulders, forward head, increased thoracic kyphosis, and flexed trunk    LOWER EXTREMITY MMT:    MMT Right Eval Left Eval  Hip flexion 4 4+  Hip extension    Hip abduction 4 5  Hip adduction 5 5  Hip internal rotation    Hip external rotation    Knee flexion 5 5  Knee extension 5 5  Ankle dorsiflexion 5 4  Ankle plantarflexion    Ankle inversion    Ankle eversion    (Blank rows = not tested)  All tested in sitting   BED MOBILITY:  Pt reports  independence, no difficulties   TRANSFERS: Assistive device utilized: None  Sit to stand: SBA Stand to sit: SBA Can perform without UE support, incr forward flexed posture in standing   GAIT: Gait pattern: step through pattern, decreased arm swing- Left, decreased stride length, decreased hip/knee flexion- Right, decreased hip/knee flexion- Left, Right foot flat, Left foot flat, lateral hip instability, and trunk flexed Distance walked: clinic distances  Assistive device utilized: Environmental consultant - 4 wheeled and Strong Arm Cane Level of assistance: SBA and CGA Comments: pt pushes rollator too far anteriorly with incr forward flexed posture during gait, noted some scissoring with RLE during gait, CGA at times when turning with cane   FUNCTIONAL TESTS:  5 times sit to stand: 22.9 seconds with no UE support  30 seconds chair stand test: 6 sit <> stands with no UE support, 6.5/10 RPE     M-CTSIB  Condition 1: Firm Surface, EO 30 Sec, Normal Sway  Condition 2: Firm Surface, EC 8.5 Sec, Mod Sway  Condition 3: Foam Surface, EO 30 Sec, Mild/Mod Sway  Condition 4: Foam Surface, EC 1.5 Sec        TODAY'S TREATMENT:                                                                                                                              DATE: 07/10/23  Therapeutic Exercise: For quadruped/tall kneeling exercises, had pt get on the mat table with CGA, pt taking incr time to get into position of quadruped  Quadruped: Cat/Cow for spinal mobility, posture, and serratus anterior strengthening for protraction, pt with limited mobility x10 reps, but pt reporting feeling good from this exercise  Fire Hydrants x12 reps for hip ABD strengthening/weight shifting, pt with limited mobility and AROM when performing  In tall kneeling with kaye bench anteriorly as needed for balance: 5 reps mini squats with focus on slowed pace and hip extension when coming upright, an additional 10 reps with blue t-band  resistance (provided by therapist posteriorly) At countertop with green band around thighs: Side stepping down and back x4 reps for hip ABD strength, pt needing BUE support, cued for posture and keeping toes pointed forwards, pt with decr step length when stepping to the R  Alternating marching x10 reps each leg with single UE support  10 reps sit <> stands with no UE support with green band around thighs for hip ABD activation    PATIENT EDUCATION: Education details: Continue HEP, can add green t-band for home around thighs for sit <> stands for hip ABD   Person educated: Patient Education method: Explanation, Demonstration, Verbal cues, and Handouts Education comprehension: verbalized understanding and returned demonstration  HOME EXERCISE PROGRAM: [email or web address]  (from previous bout of therapy, will review/update as needed)  verbally added wall bumps w/BUE flexion on 8/22  GOALS: Goals reviewed with patient? Yes  SHORT TERM GOALS: ALL STGS =LTGS  LONG TERM GOALS: Target date: 07/25/2023  Pt will be independent with final PD specific HEP with wife's supervision in order to build upon functional gains made in therapy.  Baseline:  Goal status: INITIAL  2.   Pt will perform floor transfers with SBA for improved balance recovery.  Baseline:  Goal status: INITIAL  3.  Pt will hold condition 2 of mCTSIB for at least 20 seconds for improved balance with EC   Baseline: 8.5 seconds Goal status: INITIAL  4.  Pt will improve TUG time with Strong Arm Cane to 14  seconds or less in order to demo decrease fall risk.  Baseline: 15.7 seconds  Goal status: INITIAL  5.  Pt will improve cog TUG time with Strong Arm Cane to 16 seconds or less in order to demo improved dual tasking.  Baseline: 18.34 seconds Goal status: INITIAL  6.  Pt will improve gait speed with Strong Arm Cane to at least 2.3 ft/sec in order to demo improved community mobility.   Baseline: 2.13 ft/sec  Goal status:  INITIAL  7.  Pt will perform 30 second chair stand with no UE support with 7 sit <> stands and RPE at 5/10 or less in order to demo improved strength/endurance.   Baseline: 6 sit <> stands with no UE support, 6.5/10 RPE  Goal status: INITIAL  ASSESSMENT:  CLINICAL IMPRESSION: Today's skilled session focused on tasks in standing and tall kneel/quadruped for hip strengthening, with focus on hip ABD and extension. Performed spinal mobility warm up with cat/cow for posture and thoracic extension. With hip ABD exercises, pt more challenged when having to perform on the R side. Pt tolerated session well, needing intermittent rest breaks. Will continue per POC.   OBJECTIVE IMPAIRMENTS: Abnormal gait, decreased activity tolerance, decreased balance, decreased coordination, decreased mobility, difficulty walking, decreased strength, impaired flexibility, and postural dysfunction.   ACTIVITY LIMITATIONS: lifting, bending, squatting, stairs, transfers, and locomotion level  PARTICIPATION LIMITATIONS: community activity  and yard work  PERSONAL FACTORS: Age, Past/current experiences, Time since onset of injury/illness/exacerbation, and 3+ comorbidities: PD (dx 2019), HLD, cellulitis, lymphedema, low back pain, hearing loss   are also affecting patient's functional outcome.   REHAB POTENTIAL: Good  CLINICAL DECISION MAKING: Stable/uncomplicated  EVALUATION COMPLEXITY: Low  PLAN:  PT FREQUENCY: 2x/week  PT DURATION: 4 weeks  PLANNED INTERVENTIONS: Therapeutic exercises, Therapeutic activity, Neuromuscular re-education, Balance training, Gait training, Patient/Family education, Self Care, Joint mobilization, Stair training, Vestibular training, DME instructions, Manual therapy, and Re-evaluation  PLAN FOR NEXT SESSION: review balance stuff on mat per pt request, as he got his own balance pad  Work on R hip strengthening, balance with EC, unlevel surfaces, dual tasking, posture exercises, reaching  for blaze pods upright or in quadruped?    Drake Leach, PT, DPT 07/11/2023, 8:13 AM

## 2023-07-10 NOTE — Therapy (Unsigned)
OUTPATIENT OCCUPATIONAL THERAPY PARKINSON'S TREATMENT  Patient Name: [name] MRN: [id number] DOB:12-04-1945, 77 y.o., male Today's Date: 07/10/2023  PCP: Excell Seltzer, MD  REFERRING PROVIDER: Ihor Austin, NP  END OF SESSION:  OT End of Session - 07/10/23 1318     Visit Number 4    Number of Visits 9    Date for OT Re-Evaluation 07/27/23    Authorization Type [employer] - auth approved    Authorization Time Period 06/26/2023 - 07/27/2023    Authorization - Number of Visits 8    Progress Note Due on Visit 9    OT Start Time 1318    OT Stop Time 1400    OT Time Calculation (min) 42 min    Equipment Utilized During Treatment red putty    Activity Tolerance Patient tolerated treatment well    Behavior During Therapy WFL for tasks assessed/performed              Past Medical History:  Diagnosis Date   Agatston coronary artery calcium score greater than 400    Coronary calcium score of 533. This was 69th percentile for age,   Allergy 15 yrs. or more   mosquito or wasp bites bad reaction   Aortic atherosclerosis (HCC)    Cataract 10 yrs. or more   slow bloom checkup every 6 mos.   Hyperlipidemia LDL goal <70    Neuromuscular disorder (HCC) 09/2018   Parkinsons   Parkinson disease    Sleep apnea 02/21/2021 sleep study   have Resair machine   Past Surgical History:  Procedure Laterality Date   TONSILLECTOMY     Patient Active Problem List   Diagnosis Date Noted   Agatston coronary artery calcium score greater than 400 02/02/2022   Aortic atherosclerosis (HCC) 02/01/2022   SVT (supraventricular tachycardia) 01/12/2022   OSA (obstructive sleep apnea) 01/12/2022   Parkinson's disease 11/25/2020   Edema of right lower leg 11/25/2020   Hyperlipidemia LDL goal <70 11/25/2020   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 He reports, riding his stationary bike first thing in the morning (before medicine) to loosen up.  Then stretches and does other activities from PT and OT.  He reports that his medicine has been good at minimizing tremors for him.  Pt accompanied by: self  PERTINENT HISTORY: PD (dx 2019), HLD, cellulitis, lymphedema, low back pain, hearing loss   PRECAUTIONS: Fall  WEIGHT BEARING RESTRICTIONS: No  PAIN:  Are you having pain? No  FALLS: Has patient fallen in last 6 months? No  LIVING ENVIRONMENT: Lives with: lives with their spouse Lives in: House/apartment Stairs: Yes: External: 1 steps; none Has following equipment at home that he uses: Environmental consultant - 4 wheeled and strong arm cane ; CPAP; hearing aids  PLOF: Independent; retired professor; driving  PATIENT GOALS: improve writing and use of hands  OBJECTIVE:   HAND DOMINANCE: Right  ADLs: Overall ADLs: mod I  UB Dressing: A with buttons  IADLs: Mostly mod I Handwriting: 90% legible  MOBILITY STATUS: Needs Assist: uses Rollator with SBA  POSTURE COMMENTS:  rounded shoulders, forward head, decreased lumbar lordosis, increased thoracic kyphosis, and posterior pelvic tilt  ACTIVITY TOLERANCE: Activity tolerance: good  FUNCTIONAL OUTCOME MEASURES: Fastening/unfastening 3 buttons: 31.08 sec Physical performance test: PPT#2 (simulated eating) 14.79 & PPT#4 (donning/doffing jacket): 16 sec  COORDINATION: 9 Hole Peg test: Right: 29.03 sec; Left: 29.47 sec  UE ROM:  WFL  UE MMT:    WFL  SENSATION: WFL  MUSCLE TONE: WFL  COGNITION: Overall cognitive status: Within functional limits for tasks assessed  OBSERVATIONS: Bradykinesia  TODAY'S TREATMENT:                                                                                                                               Initiated Putty Exercises with red/pink putty for coordinaiton and to maintain good hand strength for FM tasks, writing etc.  Patient provided visual demonstration, along with verbal and tactile cues as needed to improve performance of the various exercises/activities including:   - Putty Squeezes - cues to squeeze putty into log for use with other exercises and to fold putty in half with 1 hand  - Putty Rolls - encourage to roll putty into logs with "big" motions along entire length of hand, fingers and wrist to simulate large movements taught by [employer] certified therapists   - Pinch and Pull with Putty - this motion is combined with different pinches (3-Point Pinch, Tip Pinch, Key Pinch) - patient encouraged to combine tripod, pincer and/or key pinch with "pinch and pull" motion of putty pulling away from midline, changing between different pinches and changing different directions ie) to change grip   - Finger Extension with Putty - patient is encouraged to use large motions (with forearm pronated or supinated), with putty wrapped around his fingertips to stretch open his digits    PATIENT EDUCATION: Education details: putty exercises Person educated: Patient Education method: Explanation, Demonstration, Tactile cues, Verbal cues, and Handouts Education comprehension: verbalized understanding, returned demonstration, verbal cues required, tactile cues required, and needs further education  HOME EXERCISE PROGRAM: 07/03/2023: exercise chart, zoom classes, handwriting strategies 07/10/23: Putty Exercises Access Code: [email or web address]   GOALS:   LONG TERM GOALS: Target date: 07/27/2023    Pt will be  independent with PD specific HEP and schedule.  Baseline: not yet initiated Goal status: IN Progress  2.  Pt will write a short paragraph with no significant decrease in size and maintain 100% legibility.  Baseline: 90% legibility with mild decrease in size Goal status: IN Progress  3.  Pt will demonstrate increased ease with dressing as evidenced by decreasing PPT#4 (don/ doff jacket) to 15 secs or less.  Baseline: 16 secs with gown; 26 sec with Jacket on 06/05/2023 Goal status: IN Progress  4.  Pt will demonstrate improved fine motor coordination for ADLs as evidenced by decreasing 9 hole peg test score for each hand by 3 secs  Baseline:  R - 29.03 secs; L - 29.47 secs Goal status: IN Progress  5.  Pt will demonstrate improved ease with fastening buttons as evidenced by decreasing 3 button/unbutton time by at least 3 seconds 07/05/2023: with instruction - 23 seconds Baseline: 31.08 seconds Goal status: IN Progress    ASSESSMENT:  CLINICAL IMPRESSION: Patient demonstrates good understanding of putty exercises and has a good system of organization of PD resources and HEP for carryover at home.  Primary therapist to complete further upgrade and review of other PWR! Moves during future visit.   PERFORMANCE DEFICITS: in functional skills including ADLs, IADLs, coordination, strength, Fine motor control, and UE functional use.  IMPAIRMENTS: are limiting patient from ADLs, IADLs, and leisure.   COMORBIDITIES:  may have co-morbidities  that affects occupational performance. Patient will benefit from skilled OT to address above impairments and improve overall function.  REHAB POTENTIAL: Good  PLAN:  OT FREQUENCY: 2x/week  OT DURATION: 4 weeks  PLANNED INTERVENTIONS: self care/ADL training, therapeutic exercise, therapeutic activity, neuromuscular re-education, functional mobility training, patient/family education, and Re-evaluation  RECOMMENDED OTHER SERVICES: none at this  time  CONSULTED AND AGREED WITH PLAN OF CARE: Patient  PLAN FOR NEXT SESSION:  Review additional PWR! moves.    Victorino Sparrow, OT 07/10/2023, 2:03 PM

## 2023-07-12 ENCOUNTER — Ambulatory Visit: Payer: Medicare HMO | Admitting: Occupational Therapy

## 2023-07-12 ENCOUNTER — Ambulatory Visit: Payer: Medicare HMO | Admitting: Physical Therapy

## 2023-07-12 DIAGNOSIS — R293 Abnormal posture: Secondary | ICD-10-CM | POA: Diagnosis not present

## 2023-07-12 DIAGNOSIS — R278 Other lack of coordination: Secondary | ICD-10-CM | POA: Diagnosis not present

## 2023-07-12 DIAGNOSIS — R2681 Unsteadiness on feet: Secondary | ICD-10-CM

## 2023-07-12 DIAGNOSIS — G20A1 Parkinson's disease without dyskinesia, without mention of fluctuations: Secondary | ICD-10-CM

## 2023-07-12 DIAGNOSIS — M6281 Muscle weakness (generalized): Secondary | ICD-10-CM | POA: Diagnosis not present

## 2023-07-12 DIAGNOSIS — R2689 Other abnormalities of gait and mobility: Secondary | ICD-10-CM | POA: Diagnosis not present

## 2023-07-12 DIAGNOSIS — R29818 Other symptoms and signs involving the nervous system: Secondary | ICD-10-CM | POA: Diagnosis not present

## 2023-07-12 NOTE — Therapy (Signed)
OUTPATIENT OCCUPATIONAL THERAPY PARKINSON'S TREATMENT  Patient Name: [name] MRN: [id number] DOB:11-01-1946, 77 y.o., male Today's Date: 07/12/2023  PCP: Excell Seltzer, MD  REFERRING PROVIDER: Ihor Austin, NP  END OF SESSION:  OT End of Session - 07/12/23 1233     Visit Number 5    Number of Visits 9    Date for OT Re-Evaluation 07/27/23    Authorization Type [employer] - auth approved    Authorization Time Period 06/26/2023 - 07/27/2023    Authorization - Number of Visits 8    Progress Note Due on Visit 9    OT Start Time 1235    OT Stop Time 1314    OT Time Calculation (min) 39 min    Equipment Utilized During Treatment --    Activity Tolerance Patient tolerated treatment well    Behavior During Therapy WFL for tasks assessed/performed              Past Medical History:  Diagnosis Date   Agatston coronary artery calcium score greater than 400    Coronary calcium score of 533. This was 69th percentile for age,   Allergy 15 yrs. or more   mosquito or wasp bites bad reaction   Aortic atherosclerosis (HCC)    Cataract 10 yrs. or more   slow bloom checkup every 6 mos.   Hyperlipidemia LDL goal <70    Neuromuscular disorder (HCC) 09/2018   Parkinsons   Parkinson disease    Sleep apnea 02/21/2021 sleep study   have Resair machine   Past Surgical History:  Procedure Laterality Date   TONSILLECTOMY     Patient Active Problem List   Diagnosis Date Noted   Agatston coronary artery calcium score greater than 400 02/02/2022   Aortic atherosclerosis (HCC) 02/01/2022   SVT (supraventricular tachycardia) 01/12/2022   OSA (obstructive sleep apnea) 01/12/2022   Parkinson's disease 11/25/2020   Edema of right lower leg 11/25/2020   Hyperlipidemia LDL goal <70 11/25/2020   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 He reports, riding his stationary bike first thing in the morning (before medicine) to loosen up.  Then stretches and does other activities from PT and OT.  He reports that his medicine has been good at minimizing tremors for him.  Pt accompanied by: self  PERTINENT HISTORY: PD (dx 2019), HLD, cellulitis, lymphedema, low back pain, hearing loss   PRECAUTIONS: Fall  WEIGHT BEARING RESTRICTIONS: No  PAIN:  Are you having pain? No  FALLS: Has patient fallen in last 6 months? No  LIVING ENVIRONMENT: Lives with: lives with their spouse Lives in: House/apartment Stairs: Yes: External: 1 steps; none Has following equipment at home that he uses: Environmental consultant - 4 wheeled and strong arm cane ; CPAP; hearing aids  PLOF: Independent; retired professor; driving  PATIENT GOALS: improve writing and use of hands  OBJECTIVE:   HAND DOMINANCE: Right  ADLs: Overall ADLs: mod I  UB Dressing: A with buttons  IADLs: Mostly mod I Handwriting: 90% legible  MOBILITY  STATUS: Needs Assist: uses Rollator with SBA  POSTURE COMMENTS:  rounded shoulders, forward head, decreased lumbar lordosis, increased thoracic kyphosis, and posterior pelvic tilt  ACTIVITY TOLERANCE: Activity tolerance: good  FUNCTIONAL OUTCOME MEASURES: Fastening/unfastening 3 buttons: 31.08 sec Physical performance test: PPT#2 (simulated eating) 14.79 & PPT#4 (donning/doffing jacket): 16 sec  COORDINATION: 9 Hole Peg test: Right: 29.03 sec; Left: 29.47 sec  UE ROM:  WFL  UE MMT:   WFL  SENSATION: WFL  MUSCLE TONE:  WFL  COGNITION: Overall cognitive status: Within functional limits for tasks assessed  OBSERVATIONS: Bradykinesia  TODAY'S TREATMENT:                                                                                                                               reviewed Putty Exercises with red/pink putty for coordinaiton and to maintain good hand strength for FM tasks, writing etc. Including:   - Putty Squeezes - cues to squeeze putty into log for use with other exercises and to fold putty in half with 1 hand Boost added to turn putty 90* between squeezes  - [employer] - encourage to roll putty into logs with "big" motions along entire length of hand, fingers and wrist to simulate large movements taught by PWR certified therapists Boost added to roll 1 big movement forward and 1 big movement back   - Pinch and Pull with Putty - this motion is combined with different pinches (3-Point Pinch, Tip Pinch, Key Pinch) - patient encouraged to combine tripod, pincer and/or key pinch with "pinch and pull" motion of putty pulling away from midline, changing between different pinches and changing different directions ie) to change grip Boost added to open hands between pulls   - Finger Extension with Putty - patient is encouraged to use large motions (with forearm pronated or supinated), with putty wrapped around his fingertips to stretch open his digits    Pt completed writing of paragraph printed from an online article. Goal updated. Pt reminded to use PWR! Hand boost as needed especially towards end of writing task.   OT reviewed jacket donning and doffing teaching pt cape method to help bring jacket across shoulders to opposite UE.   PATIENT EDUCATION: Education details: putty exercises; jacket donning/doffing; writing Person educated: Patient Education method: Explanation, Demonstration, Tactile cues, Verbal cues, and Handouts Education comprehension: verbalized understanding, returned  demonstration, verbal cues required, tactile cues required, and needs further education  HOME EXERCISE PROGRAM: 07/03/2023: exercise chart, zoom classes, handwriting strategies 07/10/23: Putty Exercises Access Code: [email or web address]   GOALS:   LONG TERM GOALS: Target date: 07/27/2023    Pt will be independent with PD specific HEP and schedule.  Baseline: not yet initiated Goal status: MET  2.  Pt will write a short paragraph with no significant decrease in size and maintain 100% legibility.  Baseline: 90% legibility with mild decrease in size Goal status: MET  3.  Pt will demonstrate increased ease with dressing  as evidenced by decreasing PPT#4 (don/ doff jacket) to 15 secs or less.  Baseline: 16 secs with gown; 26 sec with Jacket on  Goal status: IN Progress  4.  Pt will demonstrate improved fine motor coordination for ADLs as evidenced by decreasing 9 hole peg test score for each hand by 3 secs  Baseline: R - 29.03 secs; L - 29.47 secs Goal status: IN Progress  5.  Pt will demonstrate improved ease with fastening buttons as evidenced by decreasing 3 button/unbutton time by at least 3 seconds 07/05/2023: with instruction - 23 seconds Baseline: 31.08 seconds Goal status: IN Progress    ASSESSMENT:  CLINICAL IMPRESSION: Patient demonstrates good understanding of putty exercises and does well with hand boosts provided today.  Will continue to work towards remaining goals.  PERFORMANCE DEFICITS: in functional skills including ADLs, IADLs, coordination, strength, Fine motor control, and UE functional use.  IMPAIRMENTS: are limiting patient from ADLs, IADLs, and leisure.   COMORBIDITIES:  may have co-morbidities  that affects occupational performance. Patient will benefit from skilled OT to address above impairments and improve overall function.  REHAB POTENTIAL: Good  PLAN:  OT FREQUENCY: 2x/week  OT DURATION: 4 weeks  PLANNED INTERVENTIONS: self care/ADL training, therapeutic  exercise, therapeutic activity, neuromuscular re-education, functional mobility training, patient/family education, and Re-evaluation  RECOMMENDED OTHER SERVICES: none at this time  CONSULTED AND AGREED WITH PLAN OF CARE: Patient  PLAN FOR NEXT SESSION:  Test remaining goals   Delana Meyer, OT 07/12/2023, 1:29 PM

## 2023-07-12 NOTE — Therapy (Signed)
OUTPATIENT PHYSICAL THERAPY NEURO TREATMENT   Patient Name: [name] MRN: [id number] DOB:1946-09-08, 77 y.o., male Today's Date: 07/12/2023   PCP: Excell Seltzer, MD   REFERRING PROVIDER: Ihor Austin, NP  END OF SESSION:  PT End of Session - 07/12/23 1316     Visit Number 5    Number of Visits 9    Date for PT Re-Evaluation 07/27/23    Authorization Type [employer]    PT Start Time 1315    PT Stop Time 1400    PT Time Calculation (min) 45 min    Equipment Utilized During Treatment Gait belt    Activity Tolerance Patient tolerated treatment well    Behavior During Therapy WFL for tasks assessed/performed                  Past Medical History:  Diagnosis Date   Agatston coronary artery calcium score greater than 400    Coronary calcium score of 533. This was 69th percentile for age,   Allergy 15 yrs. or more   mosquito or wasp bites bad reaction   Aortic atherosclerosis (HCC)    Cataract 10 yrs. or more   slow bloom checkup every 6 mos.   Hyperlipidemia LDL goal <70    Neuromuscular disorder (HCC) 09/2018   Parkinsons   Parkinson disease    Sleep apnea 02/21/2021 sleep study   have Resair machine   Past Surgical History:  Procedure Laterality Date   TONSILLECTOMY     Patient Active Problem List   Diagnosis Date Noted   Agatston coronary artery calcium score greater than 400 02/02/2022   Aortic atherosclerosis (HCC) 02/01/2022   SVT (supraventricular tachycardia) 01/12/2022   OSA (obstructive sleep apnea) 01/12/2022   Parkinson's disease 11/25/2020   Edema of right lower leg 11/25/2020   Hyperlipidemia LDL goal <70 11/25/2020   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   Hearing loss 11/25/2020   Pityriasis rosea 11/25/2020    ONSET DATE: 06/05/2023  REFERRING DIAG: G20.A1 (ICD-10-CM) - Parkinson's disease without dyskinesia or fluctuating manifestations  THERAPY DIAG:  Muscle weakness (generalized)  Abnormal posture  Unsteadiness on  feet  Other abnormalities of gait and mobility  Rationale for Evaluation and Treatment: Rehabilitation  SUBJECTIVE:                                                                                                                                                                                             SUBJECTIVE STATEMENT: Pt reports doing well. Got his foam pad and worked on his exercises this morning. Played Bingo at [employer] last night. Denies pain or  falls.    Pt accompanied by: self  PERTINENT HISTORY: PD (dx 2019), HLD, cellulitis, lymphedema, low back pain, hearing loss   PAIN:  Are you having pain? No  PRECAUTIONS: Fall  WEIGHT BEARING RESTRICTIONS: No  FALLS: Has patient fallen in last 6 months? No  LIVING ENVIRONMENT: Lives with: lives with their spouse Lives in: House/apartment Stairs: No, only 1 step to enter in the front.  Has following equipment at home: Dan Humphreys - 2 wheeled, Environmental consultant - 4 wheeled, and Strong Arm [place], Walk in shower   PLOF: Independent with community mobility with device  PATIENT GOALS: Wants to keep working on getting on and off the floor, work on strengthening the legs.   OBJECTIVE:   COGNITION: Overall cognitive status: Within functional limits for tasks assessed   SENSATION: Numbness/tingling in legs only if he sits for too long, otherwise WNL   COORDINATION: Heel to shin: WNL   OBSERVATIONS: Swelling in bilat ankles (pt reports taking Lasix), sometimes won't take it when coming to therapy or unfamiliar environment  POSTURE: rounded shoulders, forward head, increased thoracic kyphosis, and flexed trunk    LOWER EXTREMITY MMT:    MMT Right Eval Left Eval  Hip flexion 4 4+  Hip extension    Hip abduction 4 5  Hip adduction 5 5  Hip internal rotation    Hip external rotation    Knee flexion 5 5  Knee extension 5 5  Ankle dorsiflexion 5 4  Ankle plantarflexion    Ankle inversion    Ankle eversion    (Blank rows = not  tested)  All tested in sitting   BED MOBILITY:  Pt reports independence, no difficulties   TRANSFERS: Assistive device utilized: None  Sit to stand: SBA Stand to sit: SBA Can perform without UE support, incr forward flexed posture in standing   GAIT: Gait pattern: step through pattern, decreased arm swing- Left, decreased stride length, decreased hip/knee flexion- Right, decreased hip/knee flexion- Left, Right foot flat, Left foot flat, lateral hip instability, and trunk flexed Distance walked: clinic distances  Assistive device utilized: Environmental consultant - 4 wheeled and Strong Arm Cane Level of assistance: SBA and CGA Comments: pt pushes rollator too far anteriorly with incr forward flexed posture during gait, noted some scissoring with RLE during gait, CGA at times when turning with cane   FUNCTIONAL TESTS:  5 times sit to stand: 22.9 seconds with no UE support  30 seconds chair stand test: 6 sit <> stands with no UE support, 6.5/10 RPE     M-CTSIB  Condition 1: Firm Surface, EO 30 Sec, Normal Sway  Condition 2: Firm Surface, EC 8.5 Sec, Mod Sway  Condition 3: Foam Surface, EO 30 Sec, Mild/Mod Sway  Condition 4: Foam Surface, EC 1.5 Sec        TODAY'S TREATMENT:  DATE: 07/12/23   NMR  From 19" mat table, performed sit to stands w/10# med ball throws for improved BEL strength, anticipatory balance strategies and hip extension. Pt performed x8 reps w/SBA and progressed to x7 reps w/feet on airex for added balance challenge. Pt much more challenged on airex and required mod encouraging cues to perform due to hesitation. Pt require extra time to shift weight anteriorly and stabilize prior to achieving full hip extension and ability to throw ball, but no LOB noted. SBA throughout  In quadruped on mat, 6 Blaze pods on random reach setting for improved core stability,  reaching out of BOS and dual tasking.  Performed on 2 minute intervals with 2 minutes rest periods.  Pt requires CGA guarding. Round 1:  3 pods placed on either side of pt.  66 hits. Round 2:  same setup w/2 colors used to add dual task (naming foods when green and animals when blue).  19 hits. Notable errors/deficits:  decreased stability when reaching w/RUE   PATIENT EDUCATION: Education details: Continue HEP, adding sit to stands on airex at home  Person educated: Patient Education method: Explanation, Demonstration, and Verbal cues Education comprehension: verbalized understanding and returned demonstration  HOME EXERCISE PROGRAM: [id number]  (from previous bout of therapy, will review/update as needed)  verbally added wall bumps w/BUE flexion on 8/22  GOALS: Goals reviewed with patient? Yes  SHORT TERM GOALS: ALL STGS =LTGS  LONG TERM GOALS: Target date: 07/25/2023  Pt will be independent with final PD specific HEP with wife's supervision in order to build upon functional gains made in therapy.  Baseline:  Goal status: INITIAL  2.   Pt will perform floor transfers with SBA for improved balance recovery.  Baseline:  Goal status: INITIAL  3.  Pt will hold condition 2 of mCTSIB for at least 20 seconds for improved balance with EC   Baseline: 8.5 seconds Goal status: INITIAL  4.  Pt will improve TUG time with Strong Arm Cane to 14  seconds or less in order to demo decrease fall risk.  Baseline: 15.7 seconds  Goal status: INITIAL  5.  Pt will improve cog TUG time with Strong Arm Cane to 16 seconds or less in order to demo improved dual tasking.  Baseline: 18.34 seconds Goal status: INITIAL  6.  Pt will improve gait speed with Strong Arm Cane to at least 2.3 ft/sec in order to demo improved community mobility.   Baseline: 2.13 ft/sec  Goal status: INITIAL  7.  Pt will perform 30 second chair stand with no UE support with 7 sit <> stands and RPE at 5/10 or less in order  to demo improved strength/endurance.   Baseline: 6 sit <> stands with no UE support, 6.5/10 RPE  Goal status: INITIAL  ASSESSMENT:  CLINICAL IMPRESSION: Emphasis of skilled PT session on anticipatory balance strategies, postural control and functional strengthening. Pt continues to be challenged w/dynamic activities on airex and requires extra time to stabilize in standing, but no LOB noted throughout session. Noted significant decrease in speed of movement when adding cog dual-task to a motor task, but pt's balance maintained. Continue POC.   OBJECTIVE IMPAIRMENTS: Abnormal gait, decreased activity tolerance, decreased balance, decreased coordination, decreased mobility, difficulty walking, decreased strength, impaired flexibility, and postural dysfunction.   ACTIVITY LIMITATIONS: lifting, bending, squatting, stairs, transfers, and locomotion level  PARTICIPATION LIMITATIONS: community activity and yard work  PERSONAL FACTORS: Age, Past/current experiences, Time since onset of injury/illness/exacerbation, and 3+ comorbidities: PD (dx  2019), HLD, cellulitis, lymphedema, low back pain, hearing loss   are also affecting patient's functional outcome.   REHAB POTENTIAL: Good  CLINICAL DECISION MAKING: Stable/uncomplicated  EVALUATION COMPLEXITY: Low  PLAN:  PT FREQUENCY: 2x/week  PT DURATION: 4 weeks  PLANNED INTERVENTIONS: Therapeutic exercises, Therapeutic activity, Neuromuscular re-education, Balance training, Gait training, Patient/Family education, Self Care, Joint mobilization, Stair training, Vestibular training, DME instructions, Manual therapy, and Re-evaluation  PLAN FOR NEXT SESSION:  Work on R hip strengthening, balance with EC, unlevel surfaces, dual tasking, posture exercises, reaching for blaze pods upright or in quadruped? Sitting from low surfaces    Nishan Ovens E Kennith Morss, PT, DPT 07/12/2023, 3:33 PM

## 2023-07-17 ENCOUNTER — Ambulatory Visit: Payer: Medicare HMO | Admitting: Occupational Therapy

## 2023-07-17 ENCOUNTER — Ambulatory Visit: Payer: Medicare HMO | Attending: Family Medicine | Admitting: Physical Therapy

## 2023-07-17 DIAGNOSIS — R2689 Other abnormalities of gait and mobility: Secondary | ICD-10-CM | POA: Diagnosis not present

## 2023-07-17 DIAGNOSIS — R278 Other lack of coordination: Secondary | ICD-10-CM | POA: Insufficient documentation

## 2023-07-17 DIAGNOSIS — R29818 Other symptoms and signs involving the nervous system: Secondary | ICD-10-CM

## 2023-07-17 DIAGNOSIS — M6281 Muscle weakness (generalized): Secondary | ICD-10-CM

## 2023-07-17 DIAGNOSIS — R2681 Unsteadiness on feet: Secondary | ICD-10-CM | POA: Insufficient documentation

## 2023-07-17 DIAGNOSIS — R293 Abnormal posture: Secondary | ICD-10-CM | POA: Diagnosis not present

## 2023-07-17 DIAGNOSIS — G20A1 Parkinson's disease without dyskinesia, without mention of fluctuations: Secondary | ICD-10-CM

## 2023-07-17 NOTE — Patient Instructions (Addendum)
PWR! Hand Exercises Perform at least 10 repetitions 1x/day, and throughout the day when you are having trouble using your hands (picking up/manipulating small objects, writing, eating, typing, sewing, buttoning, etc.). ** Make each movement big and deliberate so that you feel the movement.  PWR! UP: Fists to open fingers BIG  PWR! Rock:  Move wrists up and down [employer]! Twist: Twist palms up and down BIG  PWR! Step: Step your thumb to index finger while keeping other fingers straight. Flick fingers out BIG (thumb out/straighten fingers). Repeat with other fingers.  PWR! PUSH: Push hands out BIG. Elbows straight, wrists up, fingers open and spread apart BIG.

## 2023-07-17 NOTE — Therapy (Signed)
OUTPATIENT OCCUPATIONAL THERAPY [facility]  Patient Name: [name] MRN: [id number] DOB:Jul 10, 1946, 77 y.o., male Today's Date: 07/17/2023  PCP: Excell Seltzer, MD  REFERRING PROVIDER: Ihor Austin, NP  END OF SESSION:  OT End of Session - 07/17/23 1237     Visit Number 6    Number of Visits 9    Date for OT Re-Evaluation 07/27/23    Authorization Type [employer] - auth approved    Authorization Time Period 06/26/2023 - 07/27/2023    Authorization - Number of Visits 8    Progress Note Due on Visit 9    OT Start Time 1235    OT Stop Time 1314    OT Time Calculation (min) 39 min    Activity Tolerance Patient tolerated treatment well    Behavior During Therapy WFL for tasks assessed/performed              Past Medical History:  Diagnosis Date   Agatston coronary artery calcium score greater than 400    Coronary calcium score of 533. This was 69th percentile for age,   Allergy 15 yrs. or more   mosquito or wasp bites bad reaction   Aortic atherosclerosis (HCC)    Cataract 10 yrs. or more   slow bloom checkup every 6 mos.   Hyperlipidemia LDL goal <70    Neuromuscular disorder (HCC) 09/2018   Parkinsons   Parkinson disease    Sleep apnea 02/21/2021 sleep study   have Resair machine   Past Surgical History:  Procedure Laterality Date   TONSILLECTOMY     Patient Active Problem List   Diagnosis Date Noted   Agatston coronary artery calcium score greater than 400 02/02/2022   Aortic atherosclerosis (HCC) 02/01/2022   SVT (supraventricular tachycardia) 01/12/2022   OSA (obstructive sleep apnea) 01/12/2022   Parkinson's disease 11/25/2020   Edema of right lower leg 11/25/2020   Hyperlipidemia LDL goal <70 11/25/2020   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 No issues with this.   Pt accompanied by: self  PERTINENT HISTORY: PD (dx 2019), HLD, cellulitis, lymphedema, low back pain, hearing loss   PRECAUTIONS: Fall  WEIGHT BEARING RESTRICTIONS: No  PAIN:  Are you having pain? No  FALLS: Has patient fallen in last 6 months? No  LIVING ENVIRONMENT: Lives with: lives with their spouse Lives in: House/apartment Stairs: Yes: External: 1 steps; none Has following equipment at home that he uses: Environmental consultant - 4 wheeled and strong arm cane ; CPAP; hearing aids  PLOF: Independent; retired professor; driving  PATIENT GOALS: improve writing and use of hands  OBJECTIVE:   HAND DOMINANCE: Right  ADLs: Overall ADLs: mod I  UB Dressing: A with buttons  IADLs: Mostly mod I Handwriting: 90% legible  MOBILITY STATUS: Needs Assist: uses Rollator with SBA  POSTURE COMMENTS:  rounded shoulders, forward head, decreased lumbar lordosis, increased thoracic kyphosis, and posterior pelvic tilt  ACTIVITY TOLERANCE: Activity tolerance: good  FUNCTIONAL OUTCOME MEASURES: Fastening/unfastening 3 buttons: 31.08 sec Physical performance test:  PPT#2 (simulated eating) 14.79 & PPT#4 (donning/doffing jacket): 16 sec  COORDINATION: 9 Hole Peg test: Right: 29.03 sec; Left: 29.47 sec  UE ROM:  WFL  UE MMT:   WFL  SENSATION: WFL  MUSCLE TONE: WFL  COGNITION: Overall cognitive status: Within functional limits for tasks assessed  OBSERVATIONS: Bradykinesia  TODAY'S TREATMENT:                                                                                                                                Practiced buttons with pt meeting goal.   Completed 9-hole peg testing and updated progression towards goal.   OT encouraged pt to consider adding conducting to his PD exercises for practice of large amplitude movements.   Pt completed simulated pickling activity with use of various 1 lb weighted jars including pick up, place, transport, lid removal, lid placement with visual boost and cues for foot placement for safety.   PATIENT EDUCATION: Education details: large amplitude movements Person educated: Patient Education method: Explanation, Demonstration, Tactile cues, and Verbal cues Education comprehension: verbalized understanding, returned demonstration, verbal cues required, tactile cues required, and needs further education  HOME EXERCISE PROGRAM: 07/03/2023: exercise chart, zoom classes, handwriting strategies 07/10/23: Putty Exercises Access Code: [email or web address]   GOALS:   LONG TERM GOALS: Target date: 07/27/2023    Pt will be independent with PD specific HEP and schedule.  Baseline: not yet initiated Goal status: MET  2.  Pt will write a short paragraph with no significant decrease in size and maintain 100% legibility.  Baseline: 90% legibility with mild decrease in size Goal status: MET  3.  Pt will demonstrate increased ease with dressing as evidenced by decreasing PPT#4 (don/ doff jacket) to 15 secs or less.  Baseline: 16 secs with gown; 26 sec with Jacket on  Goal status: IN Progress  4.  Pt will demonstrate improved fine motor coordination for ADLs as evidenced by decreasing 9 hole peg test score for each hand by 3 secs  Baseline: R - 29.03 secs; L - 29.47 secs 07/17/2023: R - 32 secs, 32, 29; L - 29 secs, 30, 26 (no errors) Goal status: IN Progress  5.  Pt will demonstrate improved ease with fastening buttons as evidenced by decreasing 3 button/unbutton time by at least 3 seconds 07/05/2023: with instruction - 23 seconds 07/17/2023 - 26 seconds Baseline:  31.08 seconds Goal status: MET    ASSESSMENT:  CLINICAL IMPRESSION: Patient demonstrates good understanding of PD strategies with occasional cueing for visual or hand boosts. Based on his performance during upcoming visit(s), he may be appropriate for early d/c.  PERFORMANCE DEFICITS: in functional skills including ADLs, IADLs, coordination, strength, Fine motor control, and UE functional use.  IMPAIRMENTS: are limiting patient from ADLs, IADLs, and leisure.   COMORBIDITIES:  may have co-morbidities  that affects occupational performance. Patient will benefit from skilled OT to address above impairments and improve overall function.  REHAB POTENTIAL: Good  PLAN:  OT FREQUENCY: 2x/week  OT DURATION: 4 weeks  PLANNED INTERVENTIONS: self care/ADL training, therapeutic exercise, therapeutic activity, neuromuscular re-education, functional mobility training, patient/family education, and Re-evaluation  RECOMMENDED OTHER SERVICES: none at this time  CONSULTED AND AGREED WITH PLAN OF CARE: Patient  PLAN FOR NEXT SESSION:  Test jacket and pegs - d/c? How did conducting go?   Delana Meyer, OT 07/17/2023, 1:52 PM

## 2023-07-17 NOTE — Therapy (Signed)
OUTPATIENT PHYSICAL THERAPY NEURO TREATMENT   Patient Name: [name] MRN: [id number] DOB:November 03, 1946, 77 y.o., male Today's Date: 07/17/2023   PCP: Excell Seltzer, MD   REFERRING PROVIDER: Ihor Austin, NP  END OF SESSION:  PT End of Session - 07/17/23 1316     Visit Number 6    Number of Visits 9    Date for PT Re-Evaluation 07/27/23    Authorization Type [employer]    PT Start Time 1315    PT Stop Time 1400    PT Time Calculation (min) 45 min    Equipment Utilized During Treatment Gait belt    Activity Tolerance Patient tolerated treatment well    Behavior During Therapy WFL for tasks assessed/performed                   Past Medical History:  Diagnosis Date   Agatston coronary artery calcium score greater than 400    Coronary calcium score of 533. This was 69th percentile for age,   Allergy 15 yrs. or more   mosquito or wasp bites bad reaction   Aortic atherosclerosis (HCC)    Cataract 10 yrs. or more   slow bloom checkup every 6 mos.   Hyperlipidemia LDL goal <70    Neuromuscular disorder (HCC) 09/2018   Parkinsons   Parkinson disease    Sleep apnea 02/21/2021 sleep study   have Resair machine   Past Surgical History:  Procedure Laterality Date   TONSILLECTOMY     Patient Active Problem List   Diagnosis Date Noted   Agatston coronary artery calcium score greater than 400 02/02/2022   Aortic atherosclerosis (HCC) 02/01/2022   SVT (supraventricular tachycardia) 01/12/2022   OSA (obstructive sleep apnea) 01/12/2022   Parkinson's disease 11/25/2020   Edema of right lower leg 11/25/2020   Hyperlipidemia LDL goal <70 11/25/2020   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   Hearing loss 11/25/2020   Pityriasis rosea 11/25/2020    ONSET DATE: 06/05/2023  REFERRING DIAG: G20.A1 (ICD-10-CM) - Parkinson's disease without dyskinesia or fluctuating manifestations  THERAPY DIAG:  Muscle weakness (generalized)  Abnormal posture  Unsteadiness  on feet  Other abnormalities of gait and mobility  Rationale for Evaluation and Treatment: Rehabilitation  SUBJECTIVE:                                                                                                                                                                                             SUBJECTIVE STATEMENT: Pt reports doing well. Had a good weekend, cooked steak. No falls. "The saga of the pickles continues"   Pt accompanied by: self  PERTINENT HISTORY: PD (dx 2019), HLD, cellulitis, lymphedema, low back pain, hearing loss   PAIN:  Are you having pain? No  PRECAUTIONS: Fall  WEIGHT BEARING RESTRICTIONS: No  FALLS: Has patient fallen in last 6 months? No  LIVING ENVIRONMENT: Lives with: lives with their spouse Lives in: House/apartment Stairs: No, only 1 step to enter in the front.  Has following equipment at home: Dan Humphreys - 2 wheeled, Environmental consultant - 4 wheeled, and Strong Arm [place], Walk in shower   PLOF: Independent with community mobility with device  PATIENT GOALS: Wants to keep working on getting on and off the floor, work on strengthening the legs.   OBJECTIVE:   COGNITION: Overall cognitive status: Within functional limits for tasks assessed   SENSATION: Numbness/tingling in legs only if he sits for too long, otherwise WNL   COORDINATION: Heel to shin: WNL   OBSERVATIONS: Swelling in bilat ankles (pt reports taking Lasix), sometimes won't take it when coming to therapy or unfamiliar environment  POSTURE: rounded shoulders, forward head, increased thoracic kyphosis, and flexed trunk    LOWER EXTREMITY MMT:    MMT Right Eval Left Eval  Hip flexion 4 4+  Hip extension    Hip abduction 4 5  Hip adduction 5 5  Hip internal rotation    Hip external rotation    Knee flexion 5 5  Knee extension 5 5  Ankle dorsiflexion 5 4  Ankle plantarflexion    Ankle inversion    Ankle eversion    (Blank rows = not tested)  All tested in sitting   BED  MOBILITY:  Pt reports independence, no difficulties   TRANSFERS: Assistive device utilized: None  Sit to stand: SBA Stand to sit: SBA Can perform without UE support, incr forward flexed posture in standing   GAIT: Gait pattern: step through pattern, decreased arm swing- Left, decreased stride length, decreased hip/knee flexion- Right, decreased hip/knee flexion- Left, Right foot flat, Left foot flat, lateral hip instability, and trunk flexed Distance walked: clinic distances  Assistive device utilized: Environmental consultant - 4 wheeled and Strong Arm Cane Level of assistance: SBA and CGA Comments: pt pushes rollator too far anteriorly with incr forward flexed posture during gait, noted some scissoring with RLE during gait, CGA at times when turning with cane   FUNCTIONAL TESTS:  5 times sit to stand: 22.9 seconds with no UE support  30 seconds chair stand test: 6 sit <> stands with no UE support, 6.5/10 RPE     M-CTSIB  Condition 1: Firm Surface, EO 30 Sec, Normal Sway  Condition 2: Firm Surface, EC 8.5 Sec, Mod Sway  Condition 3: Foam Surface, EO 30 Sec, Mild/Mod Sway  Condition 4: Foam Surface, EC 1.5 Sec        TODAY'S TREATMENT:  DATE: 07/17/23  NMR   In // bars, 6 Blaze pods on random reach setting for improved LE coordination, single leg stability and turning.  Performed on 2 minute intervals with 90 sec rest periods.  Pt requires SBA guarding and BUE support. Round 1:  on airex w/3 pods in front of pt and 3 pods behind pt w/cues to tap fwd and retro.  48 hits. Round 2:  same setup w/cues to turn and face pods.  60 hits. Pt stood sideways rather than turning Notable errors/deficits:  Increased difficulty tapping w/LLE >RLE At mat table for improved BEL strength, postural control, immediate standing balance and reaching out of BOS: Sit to stand thrusters w/3#  dumbbells, x12 reps. Progressed to on airex x8 reps, and pt much more challenged w/this, requiring multiple attempts to achieve standing position. SBA throughout. RPE of 9/10  Ball tosses on airex w/2nd therapist, x2 minutes w/SBA. Increased instability when reaching to L side but no LOB. Progressed to adding cog dual task (telling therapist steps to make pickles) x2 minutes. No change in balance noted w/dual task but did note increased forward truncal lean.     PATIENT EDUCATION: Education details: Continue HEP Person educated: Patient Education method: Programmer, multimedia, Demonstration, and Verbal cues Education comprehension: verbalized understanding and returned demonstration  HOME EXERCISE PROGRAM: [email or web address]  (from previous bout of therapy, will review/update as needed)  verbally added wall bumps w/BUE flexion on 8/22  GOALS: Goals reviewed with patient? Yes  SHORT TERM GOALS: ALL STGS =LTGS  LONG TERM GOALS: Target date: 07/25/2023  Pt will be independent with final PD specific HEP with wife's supervision in order to build upon functional gains made in therapy.  Baseline:  Goal status: INITIAL  2.   Pt will perform floor transfers with SBA for improved balance recovery.  Baseline:  Goal status: INITIAL  3.  Pt will hold condition 2 of mCTSIB for at least 20 seconds for improved balance with EC   Baseline: 8.5 seconds Goal status: INITIAL  4.  Pt will improve TUG time with Strong Arm Cane to 14  seconds or less in order to demo decrease fall risk.  Baseline: 15.7 seconds  Goal status: INITIAL  5.  Pt will improve cog TUG time with Strong Arm Cane to 16 seconds or less in order to demo improved dual tasking.  Baseline: 18.34 seconds Goal status: INITIAL  6.  Pt will improve gait speed with Strong Arm Cane to at least 2.3 ft/sec in order to demo improved community mobility.   Baseline: 2.13 ft/sec  Goal status: INITIAL  7.  Pt will perform 30 second chair stand with no UE  support with 7 sit <> stands and RPE at 5/10 or less in order to demo improved strength/endurance.   Baseline: 6 sit <> stands with no UE support, 6.5/10 RPE  Goal status: INITIAL  ASSESSMENT:  CLINICAL IMPRESSION: Emphasis of skilled PT session on postural control, immediate standing balance, BLE strength and reaching out of BOS. Pt continues to be most challenged by sit to stands w/feet on airex, requiring mod encouraging cues to perform as pt requires multiple attempts to come to stand. Pt demonstrates improved postural control, indicated by no LOB noted when performing thrusters on airex. Continue POC.   OBJECTIVE IMPAIRMENTS: Abnormal gait, decreased activity tolerance, decreased balance, decreased coordination, decreased mobility, difficulty walking, decreased strength, impaired flexibility, and postural dysfunction.   ACTIVITY LIMITATIONS: lifting, bending, squatting, stairs, transfers, and locomotion level  PARTICIPATION LIMITATIONS: community  activity and yard work  PERSONAL FACTORS: Age, Past/current experiences, Time since onset of injury/illness/exacerbation, and 3+ comorbidities: PD (dx 2019), HLD, cellulitis, lymphedema, low back pain, hearing loss   are also affecting patient's functional outcome.   REHAB POTENTIAL: Good  CLINICAL DECISION MAKING: Stable/uncomplicated  EVALUATION COMPLEXITY: Low  PLAN:  PT FREQUENCY: 2x/week  PT DURATION: 4 weeks  PLANNED INTERVENTIONS: Therapeutic exercises, Therapeutic activity, Neuromuscular re-education, Balance training, Gait training, Patient/Family education, Self Care, Joint mobilization, Stair training, Vestibular training, DME instructions, Manual therapy, and Re-evaluation  PLAN FOR NEXT SESSION:  Work on R hip strengthening, balance with EC, unlevel surfaces, dual tasking, posture exercises, reaching for blaze pods upright or in quadruped? Sitting from low surfaces    Zaida Reiland E Landrey Mahurin, PT, DPT 07/17/2023, 2:01  PM

## 2023-07-19 ENCOUNTER — Ambulatory Visit: Payer: Medicare HMO | Admitting: Physical Therapy

## 2023-07-19 ENCOUNTER — Ambulatory Visit: Payer: Medicare HMO | Admitting: Occupational Therapy

## 2023-07-19 DIAGNOSIS — G20A1 Parkinson's disease without dyskinesia, without mention of fluctuations: Secondary | ICD-10-CM | POA: Diagnosis not present

## 2023-07-19 DIAGNOSIS — M6281 Muscle weakness (generalized): Secondary | ICD-10-CM

## 2023-07-19 DIAGNOSIS — R278 Other lack of coordination: Secondary | ICD-10-CM | POA: Diagnosis not present

## 2023-07-19 DIAGNOSIS — R2689 Other abnormalities of gait and mobility: Secondary | ICD-10-CM

## 2023-07-19 DIAGNOSIS — R293 Abnormal posture: Secondary | ICD-10-CM | POA: Diagnosis not present

## 2023-07-19 DIAGNOSIS — R2681 Unsteadiness on feet: Secondary | ICD-10-CM | POA: Diagnosis not present

## 2023-07-19 DIAGNOSIS — R29818 Other symptoms and signs involving the nervous system: Secondary | ICD-10-CM | POA: Diagnosis not present

## 2023-07-19 NOTE — Patient Instructions (Signed)
PWR! Hand Exercises Perform each exercise at least 10 repetitions 1x/day, and PWR! PUSH throughout the day when you are having trouble using your hands (picking up small objects, writing, eating, typing, buttoning, etc.). ** Make each movement big and deliberate; feel the movement.  PWR! UP: Fists to open fingers BIG  PWR! Rock:  Move wrists up and down [employer]! Twist: Twist palms up and down BIG  PWR! Step: Step your thumb to index finger while keeping other fingers straight. Flick fingers out BIG (thumb out/straighten fingers). Repeat with other fingers.  PWR! PUSH: Push hands out BIG. Elbows straight, wrists up, fingers open and spread apart BIG.

## 2023-07-19 NOTE — Therapy (Signed)
OUTPATIENT OCCUPATIONAL THERAPY [facility]  Patient Name: [name] MRN: [id number] DOB:Aug 09, 1946, 77 y.o., male Today's Date: 07/19/2023  OCCUPATIONAL THERAPY DISCHARGE SUMMARY  Visits from Start of Care: 7  Current functional level related to goals / functional outcomes: Patient has met all short and long-term goals to date.   Remaining deficits: Pt still deals with the affects of PD; however, he is independent with PD specific HEP and strategies as needed to maintain current functional level.    Education / Equipment: Continue with HEP following OT d/c. Return for OT screen in 3-6 months following PT d/c.  Patient agrees to discharge. Patient goals were met. Patient is being discharged due to meeting the stated rehab goals.Marland Kitchen  PCP: Excell Seltzer, MD  REFERRING PROVIDER: Ihor Austin, NP  END OF SESSION:  OT End of Session - 07/19/23 1227     Visit Number 7    Number of Visits 9    Date for OT Re-Evaluation 07/27/23    Authorization Type [employer] - auth approved    Authorization Time Period 06/26/2023 - 07/27/2023    Authorization - Number of Visits 8    Progress Note Due on Visit 9    OT Start Time 1234    OT Stop Time 1258    OT Time Calculation (min) 24 min    Activity Tolerance Patient tolerated treatment well    Behavior During Therapy WFL for tasks assessed/performed             Past Medical History:  Diagnosis Date   Agatston coronary artery calcium score greater than 400    Coronary calcium score of 533. This was 69th percentile for age,   Allergy 15 yrs. or more   mosquito or wasp bites bad reaction   Aortic atherosclerosis (HCC)    Cataract 10 yrs. or more   slow bloom checkup every 6 mos.   Hyperlipidemia LDL goal <70    Neuromuscular disorder (HCC) 09/2018   Parkinsons   Parkinson disease    Sleep apnea 02/21/2021 sleep study   have Resair machine   Past Surgical History:  Procedure Laterality Date   TONSILLECTOMY      Patient Active Problem List   Diagnosis Date Noted   Agatston coronary artery calcium score greater than 400 02/02/2022   Aortic atherosclerosis (HCC) 02/01/2022   SVT (supraventricular tachycardia) 01/12/2022   OSA (obstructive sleep apnea) 01/12/2022   Parkinson's disease 11/25/2020   Edema of right lower leg 11/25/2020   Hyperlipidemia LDL goal <70 11/25/2020   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 He will continue with HEP.  Pt accompanied by: self  PERTINENT HISTORY: PD (dx 2019), HLD, cellulitis, lymphedema, low back pain, hearing loss   PRECAUTIONS: Fall  WEIGHT BEARING RESTRICTIONS: No  PAIN:  Are you having pain? No  FALLS: Has patient fallen in last 6 months? No  LIVING ENVIRONMENT: Lives with: lives with their spouse Lives in: House/apartment Stairs: Yes: External: 1 steps; none Has following equipment  at home that he uses: Environmental consultant - 4 wheeled and strong arm cane ; CPAP; hearing aids  PLOF: Independent; retired professor; driving  PATIENT GOALS: improve writing and use of hands  OBJECTIVE:   HAND DOMINANCE: Right  ADLs: Overall ADLs: mod I  UB Dressing: A with buttons  IADLs: Mostly mod I Handwriting: 90% legible  MOBILITY STATUS: Needs Assist: uses Rollator with SBA  POSTURE COMMENTS:  rounded shoulders, forward head, decreased lumbar lordosis, increased thoracic kyphosis, and posterior  pelvic tilt  ACTIVITY TOLERANCE: Activity tolerance: good  FUNCTIONAL OUTCOME MEASURES: Fastening/unfastening 3 buttons: 31.08 sec Physical performance test: PPT#2 (simulated eating) 14.79 & PPT#4 (donning/doffing jacket): 16 sec  COORDINATION: 9 Hole Peg test: Right: 29.03 sec; Left: 29.47 sec  UE ROM:  WFL  UE MMT:   WFL  SENSATION: WFL  MUSCLE TONE: WFL  COGNITION: Overall cognitive status: Within functional limits for tasks assessed  OBSERVATIONS: Bradykinesia  TODAY'S TREATMENT:                                                                                                                               Therapist reviewed goals with patient and updated patient progression.  No additional functional limitations identified. Objective measures assessed as noted in Goals section to determine progression towards goals.  PATIENT EDUCATION: Education details: OT d/c Person educated: Patient Education method: Explanation, Demonstration, Tactile cues, Verbal cues, and Handouts Education comprehension: verbalized understanding, returned demonstration, and verbal cues required  HOME EXERCISE PROGRAM: 07/03/2023: exercise chart, zoom classes, handwriting strategies 07/10/23: Putty Exercises Access Code: [email or web address]   GOALS:   LONG TERM GOALS: Target date: 07/27/2023    Pt will be independent with PD specific HEP and schedule.  Baseline: not yet initiated Goal status: MET  2.  Pt will write a short paragraph with no significant decrease in size and maintain 100% legibility.  Baseline: 90% legibility with mild decrease in size Goal status: MET  3.  Pt will demonstrate increased ease with dressing as evidenced by decreasing PPT#4 (don/ doff jacket) to 15 secs or less.  Baseline: 16 secs with gown; 26 sec with Jacket on  07/19/2023: 12 seconds Goal status: MET  4.  Pt will demonstrate improved fine motor coordination for ADLs as evidenced by decreasing 9 hole peg test score  for each hand by 3 secs  Baseline: R - 29.03 secs; L - 29.47 secs 07/17/2023: R - 32 secs, 32, 29; L - 29 secs, 30, 26 (no errors) 07/19/2023: R - 26 secs; ; L - 26 secs Goal status: MET  5.  Pt will demonstrate improved ease with fastening buttons as evidenced by decreasing 3 button/unbutton time by at least 3 seconds 07/05/2023: with instruction - 23 seconds 07/17/2023 - 26 seconds Baseline: 31.08 seconds Goal status: MET    ASSESSMENT:  CLINICAL IMPRESSION: Patient has met all goals and is appropriate for discharge. He no longer demonstrates medical necessity for continued skilled occupational services.  Recommend return for PD screen in 3-6 months after PT d/c.  PERFORMANCE DEFICITS: in functional skills including ADLs, IADLs, coordination, strength, Fine motor control, and UE functional use.  IMPAIRMENTS: are limiting patient from ADLs, IADLs, and leisure.   COMORBIDITIES:  may have co-morbidities  that affects occupational performance. Patient will benefit from skilled OT to address above impairments and improve overall function.  REHAB POTENTIAL: Good  PLAN:  OT D/C Completed   Delana Meyer, OT 07/19/2023, 1:12 PM

## 2023-07-19 NOTE — Therapy (Signed)
OUTPATIENT PHYSICAL THERAPY NEURO TREATMENT   Patient Name: [name] MRN: [id number] DOB:Aug 07, 1946, 77 y.o., male Today's Date: 07/19/2023   PCP: Excell Seltzer, MD   REFERRING PROVIDER: Ihor Austin, NP  END OF SESSION:  PT End of Session - 07/19/23 1317     Visit Number 7    Number of Visits 9    Date for PT Re-Evaluation 07/27/23    Authorization Type [employer]    PT Start Time 1316    PT Stop Time 1359    PT Time Calculation (min) 43 min    Equipment Utilized During Treatment Gait belt    Activity Tolerance Patient tolerated treatment well    Behavior During Therapy WFL for tasks assessed/performed                    Past Medical History:  Diagnosis Date   Agatston coronary artery calcium score greater than 400    Coronary calcium score of 533. This was 69th percentile for age,   Allergy 15 yrs. or more   mosquito or wasp bites bad reaction   Aortic atherosclerosis (HCC)    Cataract 10 yrs. or more   slow bloom checkup every 6 mos.   Hyperlipidemia LDL goal <70    Neuromuscular disorder (HCC) 09/2018   Parkinsons   Parkinson disease    Sleep apnea 02/21/2021 sleep study   have Resair machine   Past Surgical History:  Procedure Laterality Date   TONSILLECTOMY     Patient Active Problem List   Diagnosis Date Noted   Agatston coronary artery calcium score greater than 400 02/02/2022   Aortic atherosclerosis (HCC) 02/01/2022   SVT (supraventricular tachycardia) 01/12/2022   OSA (obstructive sleep apnea) 01/12/2022   Parkinson's disease 11/25/2020   Edema of right lower leg 11/25/2020   Hyperlipidemia LDL goal <70 11/25/2020   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 Did not win anything at [facility] last night. [employer] are still pickling. No falls or pain.   Pt accompanied by: self  PERTINENT HISTORY: PD (dx 2019), HLD, cellulitis, lymphedema, low back pain, hearing loss   PAIN:  Are you having pain? No  PRECAUTIONS: Fall  WEIGHT BEARING RESTRICTIONS: No  FALLS: Has patient fallen in last 6 months? No  LIVING ENVIRONMENT: Lives with: lives with their spouse Lives in: House/apartment Stairs: No, only 1 step to enter in the front.  Has following equipment at home: Dan Humphreys - 2 wheeled, Environmental consultant - 4 wheeled, and Strong Arm [place], Walk in shower   PLOF: Independent with community mobility with device  PATIENT GOALS: Wants to keep working on getting on and off the floor, work on strengthening the legs.   OBJECTIVE:   COGNITION: Overall cognitive status: Within functional limits for tasks assessed   SENSATION: Numbness/tingling in legs only if he sits for too long, otherwise WNL   COORDINATION: Heel to shin: WNL   OBSERVATIONS: Swelling in bilat ankles (pt reports taking Lasix), sometimes won't take it when coming to therapy or unfamiliar environment  POSTURE: rounded shoulders, forward head, increased thoracic kyphosis, and flexed trunk    LOWER EXTREMITY MMT:    MMT Right Eval Left Eval  Hip flexion 4 4+  Hip extension    Hip abduction 4 5  Hip adduction 5 5  Hip internal rotation    Hip external rotation    Knee flexion 5 5  Knee extension 5 5  Ankle dorsiflexion 5 4  Ankle plantarflexion    Ankle inversion    Ankle eversion    (Blank rows = not tested)  All tested in sitting   BED MOBILITY:  Pt reports  independence, no difficulties   TRANSFERS: Assistive device utilized: None  Sit to stand: SBA Stand to sit: SBA Can perform without UE support, incr forward flexed posture in standing   GAIT: Gait pattern: step through pattern, decreased arm swing- Left, decreased stride length, decreased hip/knee flexion- Right, decreased hip/knee flexion- Left, Right foot flat, Left foot flat, lateral hip instability, and trunk flexed Distance walked: clinic distances  Assistive device utilized: Environmental consultant - 4 wheeled and Strong Arm Cane Level of assistance: SBA and CGA Comments: pt pushes rollator too far anteriorly with incr forward flexed posture during gait, noted some scissoring with RLE during gait, CGA at times when turning with cane   FUNCTIONAL TESTS:  5 times sit to stand: 22.9 seconds with no UE support  30 seconds chair stand test: 6 sit <> stands with no UE support, 6.5/10 RPE     M-CTSIB  Condition 1: Firm Surface, EO 30 Sec, Normal Sway  Condition 2: Firm Surface, EC 8.5 Sec, Mod Sway  Condition 3: Foam Surface, EO 30 Sec, Mild/Mod Sway  Condition 4: Foam Surface, EC 1.5 Sec        TODAY'S TREATMENT:  DATE: 07/19/23  Ther Ex  Floor Recovery: Patient educated in floor recovery this visit using teach-back for injury assessment and sequencing of task in clinic setting.  Discussion of transfer of skills to variable scenarios outside the clinic.  Patient has most difficulty with performing tall to half kneel transition and remembering proper technique to get down to floor (pt scooting off edge of mat initially).  Performed 3 times. Caregiver Training:  Caregiver not present..   Level of Assist:  Verbal/tactile cues and SBA  On floor mat for improved hip extension strength, postural control, core stability and reciprocal coordination:  Mini squat to tall kneel  w/bilteral shoulder abduction, x10 reps. Pt required extra time once in tall kneel to stabilize prior to being able to abduct arms.  Pt performed tall to half-kneel w/RLE fwd w/min A for proper R ankle placement. Once in half-kneel, performed 4# ball rotations, x10 reps w/CGA due to steadying assist. Pt required min A to perform half to tall kneel for RLE management. Attempted to perform activity w/LLE forward, w/min A to obtain position, but pt unable to tolerate due to R ankle pain. Noted pt unable to maintain R ankle in neutral position and assumed everted position. Min A for half to tall kneel transition.  Bird dogs x10 per side w/min A for steadying assist. Pt initially unable to lift legs w/contralateral UE but w/cues to stabilize core, was able to perform well.  Pt performed final floor transfer w/CGA-min A due to ankle pain and fatigue.   Manual therapy  Performed PF/DF/eversion and inversion ankle mobs for pain modulation. Pt very limited w/ankle DF but did report pain relief following mobs.     PATIENT EDUCATION: Education details: Continue HEP Person educated: Patient Education method: Programmer, multimedia, Demonstration, and Verbal cues Education comprehension: verbalized understanding and returned demonstration  HOME EXERCISE PROGRAM: [id number]  (from previous bout of therapy, will review/update as needed)  verbally added wall bumps w/BUE flexion on 8/22  GOALS: Goals reviewed with patient? Yes  SHORT TERM GOALS: ALL STGS =LTGS  LONG TERM GOALS: Target date: 07/25/2023  Pt will be independent with final PD specific HEP with wife's supervision in order to build upon functional gains made in therapy.  Baseline:  Goal status: INITIAL  2.   Pt will perform floor transfers with SBA for improved balance recovery.  Baseline:  Goal status: MET  3.  Pt will hold condition 2 of mCTSIB for at least 20 seconds for improved balance with EC   Baseline: 8.5 seconds Goal status: INITIAL  4.   Pt will improve TUG time with Strong Arm Cane to 14  seconds or less in order to demo decrease fall risk.  Baseline: 15.7 seconds  Goal status: INITIAL  5.  Pt will improve cog TUG time with Strong Arm Cane to 16 seconds or less in order to demo improved dual tasking.  Baseline: 18.34 seconds Goal status: INITIAL  6.  Pt will improve gait speed with Strong Arm Cane to at least 2.3 ft/sec in order to demo improved community mobility.   Baseline: 2.13 ft/sec  Goal status: INITIAL  7.  Pt will perform 30 second chair stand with no UE support with 7 sit <> stands and RPE at 5/10 or less in order to demo improved strength/endurance.   Baseline: 6 sit <> stands with no UE support, 6.5/10 RPE  Goal status: INITIAL  ASSESSMENT:  CLINICAL IMPRESSION: Emphasis of skilled PT session on floor transfers, posterior chain strength, postural  control and reciprocal coordination. Pt able to perform floor transfers w/SBA when not fatigued but did require min cues for proper sequencing when going from standing to floor. Pt limited due to R ankle pain when in half-kneel position and required min A for BLE management when performing tall to half-kneel transition. Pt did report pain relief w/ankle mobs at end of session. Continue POC.    OBJECTIVE IMPAIRMENTS: Abnormal gait, decreased activity tolerance, decreased balance, decreased coordination, decreased mobility, difficulty walking, decreased strength, impaired flexibility, and postural dysfunction.   ACTIVITY LIMITATIONS: lifting, bending, squatting, stairs, transfers, and locomotion level  PARTICIPATION LIMITATIONS: community activity and yard work  PERSONAL FACTORS: Age, Past/current experiences, Time since onset of injury/illness/exacerbation, and 3+ comorbidities: PD (dx 2019), HLD, cellulitis, lymphedema, low back pain, hearing loss   are also affecting patient's functional outcome.   REHAB POTENTIAL: Good  CLINICAL DECISION MAKING:  Stable/uncomplicated  EVALUATION COMPLEXITY: Low  PLAN:  PT FREQUENCY: 2x/week  PT DURATION: 4 weeks  PLANNED INTERVENTIONS: Therapeutic exercises, Therapeutic activity, Neuromuscular re-education, Balance training, Gait training, Patient/Family education, Self Care, Joint mobilization, Stair training, Vestibular training, DME instructions, Manual therapy, and Re-evaluation  PLAN FOR NEXT SESSION:  Work on R hip strengthening, balance with EC, unlevel surfaces, dual tasking, posture exercises, reaching for blaze pods upright or in quadruped? Sitting from low surfaces, Tall to half-kneel transition    Kupono Marling E Himani Corona, PT, DPT 07/19/2023, 2:01 PM

## 2023-07-24 ENCOUNTER — Encounter: Payer: Self-pay | Admitting: Physical Therapy

## 2023-07-24 ENCOUNTER — Encounter: Payer: Medicare HMO | Admitting: Occupational Therapy

## 2023-07-24 ENCOUNTER — Ambulatory Visit: Payer: Medicare HMO | Admitting: Physical Therapy

## 2023-07-24 DIAGNOSIS — M6281 Muscle weakness (generalized): Secondary | ICD-10-CM

## 2023-07-24 DIAGNOSIS — R29818 Other symptoms and signs involving the nervous system: Secondary | ICD-10-CM | POA: Diagnosis not present

## 2023-07-24 DIAGNOSIS — R2689 Other abnormalities of gait and mobility: Secondary | ICD-10-CM | POA: Diagnosis not present

## 2023-07-24 DIAGNOSIS — R2681 Unsteadiness on feet: Secondary | ICD-10-CM | POA: Diagnosis not present

## 2023-07-24 DIAGNOSIS — R293 Abnormal posture: Secondary | ICD-10-CM | POA: Diagnosis not present

## 2023-07-24 DIAGNOSIS — G20A1 Parkinson's disease without dyskinesia, without mention of fluctuations: Secondary | ICD-10-CM | POA: Diagnosis not present

## 2023-07-24 DIAGNOSIS — R278 Other lack of coordination: Secondary | ICD-10-CM | POA: Diagnosis not present

## 2023-07-24 NOTE — Therapy (Signed)
OUTPATIENT PHYSICAL THERAPY NEURO TREATMENT   Patient Name: [name] MRN: [id number] DOB:Mar 20, 1946, 77 y.o., male Today's Date: 07/24/2023   PCP: Excell Seltzer, MD   REFERRING PROVIDER: Ihor Austin, NP  END OF SESSION:  PT End of Session - 07/24/23 1403     Visit Number 8    Number of Visits 9    Date for PT Re-Evaluation 07/27/23    Authorization Type [employer]    PT Start Time 1401    PT Stop Time 1442    PT Time Calculation (min) 41 min    Equipment Utilized During Treatment Gait belt    Activity Tolerance Patient tolerated treatment well    Behavior During Therapy WFL for tasks assessed/performed                    Past Medical History:  Diagnosis Date   Agatston coronary artery calcium score greater than 400    Coronary calcium score of 533. This was 69th percentile for age,   Allergy 15 yrs. or more   mosquito or wasp bites bad reaction   Aortic atherosclerosis (HCC)    Cataract 10 yrs. or more   slow bloom checkup every 6 mos.   Hyperlipidemia LDL goal <70    Neuromuscular disorder (HCC) 09/2018   Parkinsons   Parkinson disease    Sleep apnea 02/21/2021 sleep study   have Resair machine   Past Surgical History:  Procedure Laterality Date   TONSILLECTOMY     Patient Active Problem List   Diagnosis Date Noted   Agatston coronary artery calcium score greater than 400 02/02/2022   Aortic atherosclerosis (HCC) 02/01/2022   SVT (supraventricular tachycardia) 01/12/2022   OSA (obstructive sleep apnea) 01/12/2022   Parkinson's disease 11/25/2020   Edema of right lower leg 11/25/2020   Hyperlipidemia LDL goal <70 11/25/2020   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   Hearing loss 11/25/2020   Pityriasis rosea 11/25/2020    ONSET DATE: 06/05/2023  REFERRING DIAG: G20.A1 (ICD-10-CM) - Parkinson's disease without dyskinesia or fluctuating manifestations  THERAPY DIAG:  Abnormal posture  Unsteadiness on feet  Other abnormalities  of gait and mobility  Muscle weakness (generalized)  Rationale for Evaluation and Treatment: Rehabilitation  SUBJECTIVE:                                                                                                                                                                                             SUBJECTIVE STATEMENT: Got his balance pad and things have been going good with that. Got his COVID shot yesterday in his L arm and is  feeling a little sore from it. Wants to work on his balance today and some things to loosen up his ankle.   Pt accompanied by: self  PERTINENT HISTORY: PD (dx 2019), HLD, cellulitis, lymphedema, low back pain, hearing loss   PAIN:  Are you having pain? No  PRECAUTIONS: Fall  WEIGHT BEARING RESTRICTIONS: No  FALLS: Has patient fallen in last 6 months? No  LIVING ENVIRONMENT: Lives with: lives with their spouse Lives in: House/apartment Stairs: No, only 1 step to enter in the front.  Has following equipment at home: Dan Humphreys - 2 wheeled, Environmental consultant - 4 wheeled, and Strong Arm [place], Walk in shower   PLOF: Independent with community mobility with device  PATIENT GOALS: Wants to keep working on getting on and off the floor, work on strengthening the legs.   OBJECTIVE:   COGNITION: Overall cognitive status: Within functional limits for tasks assessed   SENSATION: Numbness/tingling in legs only if he sits for too long, otherwise WNL   COORDINATION: Heel to shin: WNL   OBSERVATIONS: Swelling in bilat ankles (pt reports taking Lasix), sometimes won't take it when coming to therapy or unfamiliar environment  POSTURE: rounded shoulders, forward head, increased thoracic kyphosis, and flexed trunk    LOWER EXTREMITY MMT:    MMT Right Eval Left Eval  Hip flexion 4 4+  Hip extension    Hip abduction 4 5  Hip adduction 5 5  Hip internal rotation    Hip external rotation    Knee flexion 5 5  Knee extension 5 5  Ankle dorsiflexion 5 4  Ankle  plantarflexion    Ankle inversion    Ankle eversion    (Blank rows = not tested)  All tested in sitting   BED MOBILITY:  Pt reports independence, no difficulties   TRANSFERS: Assistive device utilized: None  Sit to stand: SBA Stand to sit: SBA Can perform without UE support, incr forward flexed posture in standing   GAIT: Gait pattern: step through pattern, decreased arm swing- Left, decreased stride length, decreased hip/knee flexion- Right, decreased hip/knee flexion- Left, Right foot flat, Left foot flat, lateral hip instability, and trunk flexed Distance walked: clinic distances  Assistive device utilized: Environmental consultant - 4 wheeled and Strong Arm Cane Level of assistance: SBA and CGA Comments: pt pushes rollator too far anteriorly with incr forward flexed posture during gait, noted some scissoring with RLE during gait, CGA at times when turning with cane   FUNCTIONAL TESTS:  5 times sit to stand: 22.9 seconds with no UE support  30 seconds chair stand test: 6 sit <> stands with no UE support, 6.5/10 RPE     M-CTSIB  Condition 1: Firm Surface, EO 30 Sec, Normal Sway  Condition 2: Firm Surface, EC 8.5 Sec, Mod Sway  Condition 3: Foam Surface, EO 30 Sec, Mild/Mod Sway  Condition 4: Foam Surface, EC 1.5 Sec        TODAY'S TREATMENT:  DATE: 07/24/23  Therapeutic Exercise  Pt reporting ankle stiffness and tightness and wondering what he can do for home to help this. Pt reporting currently performing a calf stretch, but only holding for 15 seconds at a time.  Reviewed calf stretch for gastroc at counter with cues for proper technique/set up and holding for 30 seconds, performed 2 bouts of 30-45 seconds each side, with pt reporting ankle felt better afterwards and that this is something he can do at home. Performed self-ankle mobilization into DF at staircase,  first on first step and then on 2nd step, performed approx. 10 reps each LE, pt reporting not feeling a stretch as much when doing this and does not have stairs/a safe place that he can perform.   NMR Standing in // bars: alternating balloon taps with 2nd therapist with cues for posture and hip extension when performing, x15 reps, progressed difficulty by pt standing on blue mat and then added in cognitive challenge when pt needing to name fruits in alphabetical order, pt with tendency to lose balance anteriorly at times due to flexed posture after hitting the balloon  On rockerboard in A/P direction for utilization of ankle strategy:  Keeping board steady: 3 bouts of 20-25 seconds with intermittent taps to bars for balance, verbal/tactile cues for scap retraction, hip extension, and trying to look straight ahead, pt has trouble keeping more erect posture throughout  Alternating UE lifts x10 reps while trying to hold tall posture on rockerboard, pt more fatigued with this exercise  Sit <> stands on air ex and from lower surface and reaching to tap blaze pod with either R and then L hand for immediate standing balance, weight shifting, visual scanning, improved transfers, upright posture. Pt needing to use BUE support when standing from chair, unable to perform without  Performed 1 bout of 30 seconds: 3 hits 2 bouts of 1 minute with BUE support: 7 hits, 7 hits  Brief seated rest break between sets    PATIENT EDUCATION: Education details: Continue HEP - demonstrated calf stretch and holding for 30 seconds to help with ankle stiffness and calf tightness  Person educated: Patient Education method: Explanation, Demonstration, and Verbal cues Education comprehension: verbalized understanding and returned demonstration  HOME EXERCISE PROGRAM: [id number]  (from previous bout of therapy, will review/update as needed)  verbally added wall bumps w/BUE flexion on 8/22  GOALS: Goals reviewed with patient?  Yes  SHORT TERM GOALS: ALL STGS =LTGS  LONG TERM GOALS: Target date: 07/25/2023  Pt will be independent with final PD specific HEP with wife's supervision in order to build upon functional gains made in therapy.  Baseline:  Goal status: INITIAL  2.   Pt will perform floor transfers with SBA for improved balance recovery.  Baseline:  Goal status: MET  3.  Pt will hold condition 2 of mCTSIB for at least 20 seconds for improved balance with EC   Baseline: 8.5 seconds Goal status: INITIAL  4.  Pt will improve TUG time with Strong Arm Cane to 14  seconds or less in order to demo decrease fall risk.  Baseline: 15.7 seconds  Goal status: INITIAL  5.  Pt will improve cog TUG time with Strong Arm Cane to 16 seconds or less in order to demo improved dual tasking.  Baseline: 18.34 seconds Goal status: INITIAL  6.  Pt will improve gait speed with Strong Arm Cane to at least 2.3 ft/sec in order to demo improved community mobility.   Baseline: 2.13 ft/sec  Goal status: INITIAL  7.  Pt will perform 30 second chair stand with no UE support with 7 sit <> stands and RPE at 5/10 or less in order to demo improved strength/endurance.   Baseline: 6 sit <> stands with no UE support, 6.5/10 RPE  Goal status: INITIAL  ASSESSMENT:  CLINICAL IMPRESSION: Pt has been reporting some ankle tightness, so reviewed calf/ankle stretch at start of session at countertop with cues for set-up and holding for 30 seconds. Pt reporting that it felt better afterwards and plans to perform at home. Remainder of session focused on balance strategies with unlevel surfaces, upright posture, and transfers with sit <> stands. Pt fatigued more with exercises on rockerboard today. Anticipate D/C at next session with plan for PD screens in 6 months, pt in agreement with plan.    OBJECTIVE IMPAIRMENTS: Abnormal gait, decreased activity tolerance, decreased balance, decreased coordination, decreased mobility, difficulty walking,  decreased strength, impaired flexibility, and postural dysfunction.   ACTIVITY LIMITATIONS: lifting, bending, squatting, stairs, transfers, and locomotion level  PARTICIPATION LIMITATIONS: community activity and yard work  PERSONAL FACTORS: Age, Past/current experiences, Time since onset of injury/illness/exacerbation, and 3+ comorbidities: PD (dx 2019), HLD, cellulitis, lymphedema, low back pain, hearing loss   are also affecting patient's functional outcome.   REHAB POTENTIAL: Good  CLINICAL DECISION MAKING: Stable/uncomplicated  EVALUATION COMPLEXITY: Low  PLAN:  PT FREQUENCY: 2x/week  PT DURATION: 4 weeks  PLANNED INTERVENTIONS: Therapeutic exercises, Therapeutic activity, Neuromuscular re-education, Balance training, Gait training, Patient/Family education, Self Care, Joint mobilization, Stair training, Vestibular training, DME instructions, Manual therapy, and Re-evaluation  PLAN FOR NEXT SESSION: check goals and D/C, plan for screens in 6 months??   Work on R hip strengthening, balance with EC, unlevel surfaces, dual tasking, posture exercises, reaching for blaze pods upright or in quadruped? Sitting from low surfaces, Tall to half-kneel transition    Drake Leach, PT, DPT 07/24/2023, 2:44 PM

## 2023-07-26 ENCOUNTER — Ambulatory Visit: Payer: Medicare HMO | Admitting: Physical Therapy

## 2023-07-26 ENCOUNTER — Encounter: Payer: Medicare HMO | Admitting: Occupational Therapy

## 2023-07-26 DIAGNOSIS — R293 Abnormal posture: Secondary | ICD-10-CM | POA: Diagnosis not present

## 2023-07-26 DIAGNOSIS — R278 Other lack of coordination: Secondary | ICD-10-CM | POA: Diagnosis not present

## 2023-07-26 DIAGNOSIS — G20A1 Parkinson's disease without dyskinesia, without mention of fluctuations: Secondary | ICD-10-CM | POA: Diagnosis not present

## 2023-07-26 DIAGNOSIS — R2681 Unsteadiness on feet: Secondary | ICD-10-CM

## 2023-07-26 DIAGNOSIS — R2689 Other abnormalities of gait and mobility: Secondary | ICD-10-CM | POA: Diagnosis not present

## 2023-07-26 DIAGNOSIS — M6281 Muscle weakness (generalized): Secondary | ICD-10-CM | POA: Diagnosis not present

## 2023-07-26 DIAGNOSIS — R29818 Other symptoms and signs involving the nervous system: Secondary | ICD-10-CM | POA: Diagnosis not present

## 2023-07-26 NOTE — Therapy (Signed)
OUTPATIENT PHYSICAL THERAPY NEURO TREATMENT- DISCHARGE SUMMARY   Patient Name: [name] MRN: [id number] DOB:[birth year], 77 y.o., male Today's Date: 07/26/2023   PCP: Excell Seltzer, MD   REFERRING PROVIDER: Ihor Austin, NP  PHYSICAL THERAPY DISCHARGE SUMMARY  Visits from Start of Care: 9  Current functional level related to goals / functional outcomes: Mod I w/mobility w/rollator    Remaining deficits: Forward flexed posture, impaired balance, fall risk, deficits secondary to PD   Education / Equipment: HEP   Patient agrees to discharge. Patient goals were partially met. Patient is being discharged due to meeting the stated rehab goals.   END OF SESSION:  PT End of Session - 07/26/23 1321     Visit Number 9    Number of Visits 9    Date for PT Re-Evaluation 07/27/23    Authorization Type [employer]    PT Start Time 1318    PT Stop Time 1408    PT Time Calculation (min) 50 min    Equipment Utilized During Treatment Gait belt    Activity Tolerance Patient tolerated treatment well    Behavior During Therapy WFL for tasks assessed/performed                    Past Medical History:  Diagnosis Date   Agatston coronary artery calcium score greater than 400    Coronary calcium score of 533. This was 69th percentile for age,   Allergy 15 yrs. or more   mosquito or wasp bites bad reaction   Aortic atherosclerosis (HCC)    Cataract 10 yrs. or more   slow bloom checkup every 6 mos.   Hyperlipidemia LDL goal <70    Neuromuscular disorder (HCC) 09/2018   Parkinsons   Parkinson disease    Sleep apnea 02/21/2021 sleep study   have Resair machine   Past Surgical History:  Procedure Laterality Date   TONSILLECTOMY     Patient Active Problem List   Diagnosis Date Noted   Agatston coronary artery calcium score greater than 400 02/02/2022   Aortic atherosclerosis (HCC) 02/01/2022   SVT (supraventricular tachycardia) 01/12/2022   OSA (obstructive  sleep apnea) 01/12/2022   Parkinson's disease 11/25/2020   Edema of right lower leg 11/25/2020   Hyperlipidemia LDL goal <70 11/25/2020   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 Wife, Marcelino Duster, present for beginning of session. Denies falls or acute changes. Ankle is still bothersome but did not provide pain rating.   Pt accompanied by: self and wife, Marcelino Duster   PERTINENT HISTORY: PD (dx 2019), HLD, cellulitis, lymphedema, low back pain, hearing loss   PAIN:  Are you having pain? No  PRECAUTIONS: Fall  WEIGHT BEARING RESTRICTIONS: No  FALLS: Has patient fallen in last 6 months? No  LIVING ENVIRONMENT: Lives with: lives with their spouse Lives in: House/apartment Stairs: No, only 1 step to enter in the front.  Has following equipment at home: Dan Humphreys - 2 wheeled, Environmental consultant - 4 wheeled, and Strong Arm [place], Walk in shower   PLOF: Independent with community mobility with device  PATIENT GOALS: Wants to keep working on getting on and off the floor, work on strengthening the legs.   OBJECTIVE:   COGNITION: Overall cognitive status: Within functional limits for tasks assessed   SENSATION: Numbness/tingling in legs only if he sits for too long, otherwise WNL   COORDINATION: Heel to shin: WNL   OBSERVATIONS: Swelling in bilat ankles (pt reports taking Lasix), sometimes  won't take it when coming to therapy or unfamiliar environment  POSTURE: rounded shoulders, forward head, increased thoracic kyphosis, and flexed trunk    LOWER EXTREMITY MMT:    MMT Right Eval Left Eval  Hip flexion 4 4+  Hip extension    Hip abduction 4 5  Hip adduction 5 5  Hip internal rotation    Hip external rotation    Knee flexion 5 5  Knee extension 5 5  Ankle dorsiflexion 5 4  Ankle plantarflexion    Ankle inversion    Ankle eversion    (Blank rows = not tested)  All tested in sitting   BED MOBILITY:  Pt reports independence, no difficulties   TRANSFERS: Assistive device utilized: None  Sit to stand: SBA Stand to sit: SBA Can perform without UE support, incr forward flexed posture in standing   GAIT: Gait pattern: step through pattern, decreased arm swing- Left, decreased stride length, decreased hip/knee flexion- Right, decreased hip/knee flexion- Left, Right foot flat, Left foot flat, lateral hip instability, and trunk flexed Distance walked: clinic distances  Assistive device utilized: Environmental consultant - 4 wheeled and Strong Arm Cane Level of assistance: SBA and CGA Comments: pt pushes rollator too far anteriorly with incr forward flexed posture during gait, noted some scissoring with RLE during gait, CGA at times when turning with cane   FUNCTIONAL TESTS:  5 times sit to stand: 22.9 seconds with no UE support  30 seconds chair stand test: 6 sit <> stands with no UE support, 6.5/10 RPE     M-CTSIB  Condition 1: Firm Surface, EO 30 Sec, Normal Sway  Condition 2: Firm Surface, EC 8.5 Sec, Mod Sway  Condition 3: Foam Surface, EO 30 Sec, Mild/Mod Sway  Condition 4: Foam Surface, EC 1.5 Sec      TODAY'S TREATMENT:  DATE: 07/26/23  Therapeutic Activity   MCTSIB: Condition 2: Avg of 3 trials: 30 sec   [facility] PT Assessment - 07/26/23 1359        Ambulation/Gait   Gait velocity 32.8' over 13s = 2.52 ft/s w/strong arm cane      Timed Up and Go Test   Normal TUG (seconds) 15.34    Cognitive TUG (seconds) 17.37   Retro counting by 3s starting at 91   TUG Comments performed with Strong Arm Cane            Addressed all of Michelle's questions regarding common PD symptoms, concern over pt's posture and AD recommendations.  Reviewed PD community resources w/pt and wife w/emphasis on chair yoga, cycling and PD support groups.  Trialed use of wedge in pt's R shoe that biases calcaneal inversion to assess change in gait kinematics and R ankle pain. Pt demonstrating R foot slap w/use of wedge as it promoted heel strike, but no LOB noted. Pt to practice w/wedge at home and educated pt on how to obtain custom foot orthotic if he finds wedge helpful. Pt verbalized understanding.  Discussed scheduling PD screens, but pt reports he sees his neurologist in ~5 months and can ask for a referral then.     PATIENT EDUCATION: Education details: Continue HEP, goal outcomes, see above Person educated: Patient and Spouse Education method: Explanation, Demonstration, and Handouts Education comprehension: verbalized understanding and returned demonstration  HOME EXERCISE PROGRAM: [id number]  (from previous bout of therapy, will review/update as needed)  verbally added wall bumps w/BUE flexion on 8/22  GOALS: Goals reviewed with patient? Yes  SHORT TERM GOALS: ALL STGS =LTGS  LONG TERM GOALS: Target date: 07/25/2023  Pt will be independent with final PD specific HEP with wife's supervision in order to build upon functional gains made in therapy.  Baseline:  Goal status: MET  2.   Pt will perform floor transfers with SBA for improved balance recovery.  Baseline:  Goal status: MET  3.  Pt will hold condition 2 of mCTSIB for at least 20 seconds for improved balance with EC   Baseline: 8.5 seconds; 30 seconds on 9/12 Goal status:  MET  4.  Pt will improve TUG time with Strong Arm Cane to 14  seconds or less in order to demo decrease fall risk.  Baseline: 15.7 seconds; 15.34s (9/12) Goal status: NOT MET  5.  Pt will improve cog TUG time with Strong Arm Cane to 16 seconds or less in order to demo improved dual tasking.  Baseline: 18.34 seconds; 17.37s  Goal status: PARTIALLY MET  6.  Pt will improve gait speed with Strong Arm Cane to at least 2.3 ft/sec in order to demo improved community mobility.   Baseline: 2.13 ft/sec; 2.52 ft/s  Goal status: MET  7.  Pt will perform 30 second chair stand with no UE support with 7 sit <> stands and RPE at 5/10 or less in order to demo improved strength/endurance.   Baseline: 6 sit <> stands with no UE support, 6.5/10 RPE; 6 reps w/no UE support, RPE of 7/10   Goal status: NOT MET  ASSESSMENT:  CLINICAL IMPRESSION: Emphasis of skilled PT session on LTG assessment and DC from PT. Pt has met 4 of 7 LTGs and partially met 1 goal. Pt has improved his vestibular input per his improvement on condition 2 of MCTSIB, surpassing his goal by 10s. Pt has also improved his gait speed w/StrongArm cane to above goal level.  Pt able to perform floor transfers well at mod I-SBA level, which was tested a few sessions ago. Pt continues to be limited by poor postural control and difficulty w/turns, as indicated by TUG. Pt did improve his cog TUG, just not quite to goal level, and demonstrated safer turn on cog TUG compared to normal TUG. Pt performed 6 sit to stands during 30s STS test and reported very similar RPE, indicative of pt being close to baseline level as this did not improve from eval. Pt is very consistent w/HEP and plans on joining the [employer] to continue fitness post-DC. Pt would like to think about scheduling PD screens at this time, as he sees his neurologist in ~5 months and knows how to obtain referral to return to clinic. Pt in agreement to DC this date due to being satisfied w/current  functional level and having a better understanding of how to work on his deficits at home.    OBJECTIVE IMPAIRMENTS: Abnormal gait, decreased activity tolerance, decreased balance, decreased coordination, decreased mobility, difficulty walking, decreased strength, impaired flexibility, and postural dysfunction.   ACTIVITY LIMITATIONS: lifting, bending, squatting, stairs, transfers, and locomotion level  PARTICIPATION LIMITATIONS: community activity and yard work  PERSONAL FACTORS: Age, Past/current experiences, Time since onset of injury/illness/exacerbation, and 3+ comorbidities: PD (dx 2019), HLD, cellulitis, lymphedema, low back pain, hearing loss   are also affecting patient's functional outcome.   REHAB POTENTIAL: Good  CLINICAL DECISION MAKING: Stable/uncomplicated  EVALUATION COMPLEXITY: Low  PLAN:  PT FREQUENCY: 2x/week  PT DURATION: 4 weeks  PLANNED INTERVENTIONS: Therapeutic exercises, Therapeutic activity, Neuromuscular re-education, Balance training, Gait training, Patient/Family education, Self Care, Joint mobilization, Stair training, Vestibular training, DME instructions, Manual therapy, and Re-evaluation    Venetia Prewitt E Aerianna Losey, PT, DPT 07/26/2023, 2:28 PM

## 2023-08-09 DIAGNOSIS — L821 Other seborrheic keratosis: Secondary | ICD-10-CM | POA: Diagnosis not present

## 2023-08-09 DIAGNOSIS — D2272 Melanocytic nevi of left lower limb, including hip: Secondary | ICD-10-CM | POA: Diagnosis not present

## 2023-08-09 DIAGNOSIS — L603 Nail dystrophy: Secondary | ICD-10-CM | POA: Diagnosis not present

## 2023-08-09 DIAGNOSIS — L218 Other seborrheic dermatitis: Secondary | ICD-10-CM | POA: Diagnosis not present

## 2023-08-09 DIAGNOSIS — D2262 Melanocytic nevi of left upper limb, including shoulder: Secondary | ICD-10-CM | POA: Diagnosis not present

## 2023-08-09 DIAGNOSIS — L648 Other androgenic alopecia: Secondary | ICD-10-CM | POA: Diagnosis not present

## 2023-08-09 DIAGNOSIS — D2261 Melanocytic nevi of right upper limb, including shoulder: Secondary | ICD-10-CM | POA: Diagnosis not present

## 2023-08-09 DIAGNOSIS — D2271 Melanocytic nevi of right lower limb, including hip: Secondary | ICD-10-CM | POA: Diagnosis not present

## 2023-08-09 DIAGNOSIS — D225 Melanocytic nevi of trunk: Secondary | ICD-10-CM | POA: Diagnosis not present

## 2023-10-04 ENCOUNTER — Telehealth: Payer: Self-pay | Admitting: *Deleted

## 2023-10-04 DIAGNOSIS — E785 Hyperlipidemia, unspecified: Secondary | ICD-10-CM

## 2023-10-04 DIAGNOSIS — Z125 Encounter for screening for malignant neoplasm of prostate: Secondary | ICD-10-CM

## 2023-10-04 NOTE — Telephone Encounter (Signed)
-----   Message from Alvina Chou sent at 10/04/2023  9:42 AM EST ----- Regarding: Lab orders for [facility], 12.5.24 Patient is scheduled for CPX labs, please order future labs, Thanks , Camelia Eng

## 2023-10-18 ENCOUNTER — Other Ambulatory Visit (INDEPENDENT_AMBULATORY_CARE_PROVIDER_SITE_OTHER): Payer: Medicare HMO

## 2023-10-18 DIAGNOSIS — E785 Hyperlipidemia, unspecified: Secondary | ICD-10-CM | POA: Diagnosis not present

## 2023-10-18 DIAGNOSIS — Z125 Encounter for screening for malignant neoplasm of prostate: Secondary | ICD-10-CM | POA: Diagnosis not present

## 2023-10-18 LAB — LIPID PANEL
Cholesterol: 94 mg/dL (ref 0–200)
HDL: 40.3 mg/dL (ref >39.00)
LDL Cholesterol: 40 mg/dL (ref 0–99)
NonHDL: 54.01
Total CHOL/HDL Ratio: 2
Triglycerides: 72 mg/dL (ref 0.0–149.0)
VLDL: 14.4 mg/dL (ref 0.0–40.0)

## 2023-10-18 LAB — COMPREHENSIVE METABOLIC PANEL
ALT: 8 U/L (ref 0–53)
AST: 28 U/L (ref 0–37)
Albumin: 4.3 g/dL (ref 3.5–5.2)
Alkaline Phosphatase: 30 U/L — ABNORMAL LOW (ref 39–117)
BUN: 14 mg/dL (ref 6–23)
CO2: 28 meq/L (ref 19–32)
Calcium: 9.7 mg/dL (ref 8.4–10.5)
Chloride: 105 meq/L (ref 96–112)
Creatinine, Ser: 0.65 mg/dL (ref 0.40–1.50)
GFR: 92.15 mL/min (ref >60.00)
Glucose, Bld: 103 mg/dL — ABNORMAL HIGH (ref 70–99)
Potassium: 4.1 meq/L (ref 3.5–5.1)
Sodium: 141 meq/L (ref 135–145)
Total Bilirubin: 1 mg/dL (ref 0.2–1.2)
Total Protein: 6.9 g/dL (ref 6.0–8.3)

## 2023-10-18 LAB — PSA, MEDICARE: PSA: 0.23 ng/mL (ref 0.10–4.00)

## 2023-10-18 NOTE — Progress Notes (Signed)
No critical labs need to be addressed urgently. We will discuss labs in detail at upcoming office visit.   

## 2023-10-25 ENCOUNTER — Ambulatory Visit: Payer: Medicare HMO | Admitting: Family Medicine

## 2023-10-26 ENCOUNTER — Ambulatory Visit: Payer: Medicare HMO | Admitting: Family Medicine

## 2023-10-26 ENCOUNTER — Encounter: Payer: Self-pay | Admitting: Family Medicine

## 2023-10-26 VITALS — BP 108/56 | HR 83 | Temp 98.7°F | Ht 70.75 in | Wt 244.0 lb

## 2023-10-26 DIAGNOSIS — G4733 Obstructive sleep apnea (adult) (pediatric): Secondary | ICD-10-CM | POA: Diagnosis not present

## 2023-10-26 DIAGNOSIS — E785 Hyperlipidemia, unspecified: Secondary | ICD-10-CM | POA: Diagnosis not present

## 2023-10-26 DIAGNOSIS — G20A1 Parkinson's disease without dyskinesia, without mention of fluctuations: Secondary | ICD-10-CM

## 2023-10-26 DIAGNOSIS — R7303 Prediabetes: Secondary | ICD-10-CM | POA: Diagnosis not present

## 2023-10-26 DIAGNOSIS — R600 Localized edema: Secondary | ICD-10-CM

## 2023-10-26 LAB — POCT GLYCOSYLATED HEMOGLOBIN (HGB A1C): Hemoglobin A1C: 5.3 % (ref 4.0–5.6)

## 2023-10-26 NOTE — Assessment & Plan Note (Signed)
Followed by neurology. On Sinemet 25/100 mg p.o. 3 times daily.  Doing physical therapy.

## 2023-10-26 NOTE — Assessment & Plan Note (Signed)
Peripheral edema in right lower leg given history of  injury and recurrent cellulitis.  He takes low-dose furosemide 10 mg daily to help with swelling.  Wears compression hose.

## 2023-10-26 NOTE — Progress Notes (Signed)
Patient ID: [name], male    DOB: 21-Oct-1946, 77 y.o.   MRN: [id number]  This visit was conducted in person.  BP (!) 108/56   Pulse 83   Temp 98.7 F (37.1 C) (Temporal)   Ht 5' 10.75" (1.797 m)   Wt 244 lb (110.7 kg)   SpO2 95%   BMI 34.27 kg/m    CC:  Chief Complaint  Patient presents with   Medical Management of Chronic Issues    6 mo f/u    Subjective:   HPI: [name] is a 77 y.o. male presenting on 10/26/2023 for Medical Management of Chronic Issues (6 mo f/u)    Has brand new hearing aides.  Parkinson's disease: Diagnosed in 2019 Followed by neurology Dr. Frances Furbish... planning on changing Neurologists On Sinemet 25/100 mg p.o. 3 times daily.  Doing physical therapy.  Elevated Cholesterol: LDL goal less than 70 given aortic atherosclerosis On Crestor 10 mg daily,  Has stopped fenofibrate 160 mg p.o. daily Saw cardiology Dr. Mayford Knife in early 2023 for irregular heart rate.( PACs seen on CPAP monitor). wore monitor... ZIO showed NSR with episodes of SVT.Marland Kitchen started on toprol XL Lab Results  Component Value Date   CHOL 94 10/18/2023   HDL 40.30 10/18/2023   LDLCALC 40 10/18/2023   TRIG 72.0 10/18/2023   CHOLHDL 2 10/18/2023  Using medications without problems: no side effects Muscle aches:  Diet compliance: moderate Exercise: as tolerated Other complaints:    Obstructive sleep apnea: Using CPAP, well controlled.    Peripheral edema in right lower leg given history of recurrent cellulitis.  He takes low-dose furosemide 10 mg daily to help with swelling.  Wears compression hose  Relevant past medical, surgical, family and social history reviewed and updated as indicated. Interim medical history since our last visit reviewed. Allergies and medications reviewed and updated. Outpatient Medications Prior to Visit  Medication Sig Dispense Refill   ASPIRIN 81 PO Take by mouth daily.     carbidopa-levodopa (SINEMET IR) 25-100 MG tablet Take 1 tablet by mouth  3 (three) times daily. 270 tablet 3   cetirizine (ZYRTEC ALLERGY) 10 MG tablet Take 1 tablet (10 mg total) by mouth daily. 90 tablet 3   Coenzyme Q10 (COQ10 PO) Take by mouth. 1 per day     furosemide (LASIX) 20 MG tablet Take 0.5 tablets (10 mg total) by mouth daily. 45 tablet 3   glucosamine-chondroitin 500-400 MG tablet Take 1 tablet by mouth daily.     ketoconazole (NIZORAL) 2 % cream Apply 1 Application topically daily. 60 g 0   ketoconazole (NIZORAL) 2 % shampoo Apply 1 Application topically 2 (two) times a week.     metoprolol succinate (TOPROL-XL) 25 MG 24 hr tablet Take 1 tablet by mouth once daily 90 tablet 3   Multiple Vitamins-Minerals (MULTIVITAMIN WITH MINERALS) tablet Take 1 tablet by mouth daily.     mupirocin ointment (BACTROBAN) 2 % Apply topically as needed.     nystatin (MYCOSTATIN/NYSTOP) powder Apply 1 Application topically 3 (three) times daily. 15 g 0   pramipexole (MIRAPEX) 1 MG tablet Take 1 tablet (1 mg total) by mouth in the morning, at noon, and at bedtime. 270 tablet 3   rosuvastatin (CRESTOR) 10 MG tablet Take 1 tablet by mouth once daily 90 tablet 3   No facility-administered medications prior to visit.     Per HPI unless specifically indicated in ROS section below Review of Systems  Constitutional:  Negative  for fatigue and fever.  HENT:  Negative for ear pain.   Eyes:  Negative for pain.  Respiratory:  Negative for cough and shortness of breath.   Cardiovascular:  Negative for chest pain, palpitations and leg swelling.  Gastrointestinal:  Negative for abdominal pain.  Genitourinary:  Negative for dysuria.  Musculoskeletal:  Negative for arthralgias.  Neurological:  Negative for syncope, light-headedness and headaches.  Psychiatric/Behavioral:  Negative for dysphoric mood.    Objective:  BP (!) 108/56   Pulse 83   Temp 98.7 F (37.1 C) (Temporal)   Ht 5' 10.75" (1.797 m)   Wt 244 lb (110.7 kg)   SpO2 95%   BMI 34.27 kg/m   Wt Readings from Last  3 Encounters:  10/26/23 244 lb (110.7 kg)  05/23/23 248 lb (112.5 kg)  04/25/23 246 lb (111.6 kg)      Physical Exam Constitutional:      Appearance: He is well-developed.     Comments: Using rolling walker  HENT:     Head: Normocephalic.     Right Ear: Hearing normal.     Left Ear: Hearing normal.     Nose: Nose normal.  Neck:     Thyroid: No thyroid mass or thyromegaly.     Vascular: No carotid bruit.     Trachea: Trachea normal.  Cardiovascular:     Rate and Rhythm: Normal rate and regular rhythm.     Pulses: Normal pulses.     Heart sounds: Heart sounds not distant. No murmur heard.    No friction rub. No gallop.     Comments: No peripheral edema Pulmonary:     Effort: Pulmonary effort is normal. No respiratory distress.     Breath sounds: Normal breath sounds.  Skin:    General: Skin is warm and dry.     Findings: No rash.  Neurological:     Cranial Nerves: Cranial nerves 2-12 are intact.     Sensory: Sensation is intact.     Motor: Motor function is intact.     Coordination: Coordination abnormal.     Gait: Gait abnormal.  Psychiatric:        Speech: Speech normal.        Behavior: Behavior normal.        Thought Content: Thought content normal.       Results for orders placed or performed in visit on 10/26/23  POCT glycosylated hemoglobin (Hb A1C)   Collection Time: 10/26/23 10:41 AM  Result Value Ref Range   Hemoglobin A1C 5.3 4.0 - 5.6 %   HbA1c POC (<> result, manual entry)     HbA1c, POC (prediabetic range)     HbA1c, POC (controlled diabetic range)       COVID 19 screen:  No recent travel or known exposure to COVID19 The patient denies respiratory symptoms of COVID 19 at this time. The importance of social distancing was discussed today.   Assessment and Plan The patient's preventative maintenance and recommended screening tests for an annual wellness exam were reviewed in full today. Brought up to date unless services declined.  Counselled  on the importance of diet, exercise, and its role in overall health and mortality. The patient's FH and SH was reviewed, including their home life, tobacco status, and drug and alcohol status.    Vaccines: COVID x 5, influenza vaccine in fall, Tdap August 2020, completed pneumonia and Shingrix series Prostate Cancer Screen:   father with prostate cancer Lab Results  Component Value Date  PSA 0.23 10/18/2023   PSA 0.22 04/18/2023   PSA 0.23 04/19/2022  Colon Cancer Screen: November 04, 2018,  Was normal... plan none further given age.      Smoking Status: Former smoker ETOH/ drug use: 2 beers a day three times a week/none  Hep C: Negative     Problem List Items Addressed This Visit     Edema of right lower leg   Peripheral edema in right lower leg given history of  injury and recurrent cellulitis.  He takes low-dose furosemide 10 mg daily to help with swelling.  Wears compression hose.      Hyperlipidemia LDL goal <70 (Chronic)   Stable, chronic.  Continue current medication.  LDL goal less than 70 given aortic atherosclerosis On Crestor 10 mg daily,   Has stipped fenofibrate with minimal cahnge in chol panel.      OSA (obstructive sleep apnea) (Chronic)   Using CPAP, well controlled.      Parkinson's disease (HCC) - Primary (Chronic)   Followed by neurology. On Sinemet 25/100 mg p.o. 3 times daily.  Doing physical therapy.      Other Visit Diagnoses       Prediabetes       Relevant Orders   POCT glycosylated hemoglobin (Hb A1C) (Completed)        Kerby Nora, MD

## 2023-10-26 NOTE — Assessment & Plan Note (Signed)
Stable, chronic.  Continue current medication.  LDL goal less than 70 given aortic atherosclerosis On Crestor 10 mg daily,   Has stipped fenofibrate with minimal cahnge in chol panel.

## 2023-10-26 NOTE — Assessment & Plan Note (Signed)
Using CPAP, well controlled.

## 2023-12-21 DIAGNOSIS — H2513 Age-related nuclear cataract, bilateral: Secondary | ICD-10-CM | POA: Diagnosis not present

## 2023-12-21 DIAGNOSIS — H43813 Vitreous degeneration, bilateral: Secondary | ICD-10-CM | POA: Diagnosis not present

## 2023-12-21 DIAGNOSIS — H524 Presbyopia: Secondary | ICD-10-CM | POA: Diagnosis not present

## 2023-12-21 DIAGNOSIS — H5213 Myopia, bilateral: Secondary | ICD-10-CM | POA: Diagnosis not present

## 2023-12-21 DIAGNOSIS — H25043 Posterior subcapsular polar age-related cataract, bilateral: Secondary | ICD-10-CM | POA: Diagnosis not present

## 2023-12-24 ENCOUNTER — Telehealth: Payer: Self-pay | Admitting: Adult Health

## 2023-12-24 NOTE — Telephone Encounter (Signed)
 Appointment details confirmed

## 2023-12-25 NOTE — Progress Notes (Unsigned)
[name]

## 2023-12-26 ENCOUNTER — Ambulatory Visit: Payer: Medicare HMO | Admitting: Adult Health

## 2023-12-26 ENCOUNTER — Encounter: Payer: Self-pay | Admitting: Adult Health

## 2023-12-26 VITALS — BP 112/57 | HR 67 | Ht 69.0 in | Wt 240.6 lb

## 2023-12-26 DIAGNOSIS — G20A1 Parkinson's disease without dyskinesia, without mention of fluctuations: Secondary | ICD-10-CM | POA: Diagnosis not present

## 2023-12-26 DIAGNOSIS — G4733 Obstructive sleep apnea (adult) (pediatric): Secondary | ICD-10-CM

## 2023-12-26 MED ORDER — PRAMIPEXOLE DIHYDROCHLORIDE 1 MG PO TABS
1.0000 mg | ORAL_TABLET | Freq: Three times a day (TID) | ORAL | 3 refills | Status: DC
Start: 2023-12-26 — End: 2024-04-03

## 2023-12-26 MED ORDER — CARBIDOPA-LEVODOPA 25-100 MG PO TABS: 1.0000 | ORAL_TABLET | Freq: Three times a day (TID) | ORAL | 3 refills | Status: DC

## 2023-12-26 NOTE — Patient Instructions (Addendum)
Your Plan:  Continue nightly use of CPAP for adequate sleep apnea management  Will request mask refitting if needed  Continue Sinemet 1 tab 3 times daily and pramipexole 1 tab 3 times daily  Referral placed to physical and occupational therapy - you will be called to schedule     Follow-up in 6 months or call earlier if needed     Thank you for coming to see [place] at [facility]. I hope we have been able to provide you high quality care today.  You may receive a patient satisfaction survey over the next few weeks. We would appreciate your feedback and comments so that we may continue to improve ourselves and the health of our patients.

## 2024-01-21 NOTE — Therapy (Signed)
 OUTPATIENT PHYSICAL THERAPY NEURO EVALUATION   Patient Name: [name] MRN: [id number] DOB:[birth year], 78 y.o., male Today's Date: 01/22/2024   PCP: Excell Seltzer, MD  REFERRING PROVIDER: Ihor Austin, NP   END OF SESSION:  PT End of Session - 01/22/24 1321     Visit Number 1    Number of Visits 5    Date for PT Re-Evaluation 02/21/24    Authorization Type [employer] MEDICARE    PT Start Time 1318    PT Stop Time 1358    PT Time Calculation (min) 40 min    Equipment Utilized During Treatment Gait belt    Activity Tolerance Patient tolerated treatment well    Behavior During Therapy WFL for tasks assessed/performed             Past Medical History:  Diagnosis Date   Agatston coronary artery calcium score greater than 400    Coronary calcium score of 533. This was 69th percentile for age,   Allergy 15 yrs. or more   mosquito or wasp bites bad reaction   Aortic atherosclerosis (HCC)    Cataract 10 yrs. or more   slow bloom checkup every 6 mos.   Hyperlipidemia LDL goal <70    Neuromuscular disorder (HCC) 09/2018   Parkinsons   Parkinson disease (HCC)    Sleep apnea 02/21/2021 sleep study   have Resair machine   Past Surgical History:  Procedure Laterality Date   TONSILLECTOMY     Patient Active Problem List   Diagnosis Date Noted   Agatston coronary artery calcium score greater than 400 02/02/2022   Aortic atherosclerosis (HCC) 02/01/2022   SVT (supraventricular tachycardia) (HCC) 01/12/2022   OSA (obstructive sleep apnea) 01/12/2022   Parkinson's disease (HCC) 11/25/2020   Edema of right lower leg 11/25/2020   Hyperlipidemia LDL goal <70 11/25/2020   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   Hearing loss 11/25/2020   Pityriasis rosea 11/25/2020    ONSET DATE: 12/26/2023  REFERRING DIAG: G20.A1 (ICD-10-CM) - Parkinson's disease without dyskinesia or fluctuating manifestations (HCC)   THERAPY DIAG:  Other abnormalities of gait and  mobility  Unsteadiness on feet  Abnormal posture  Muscle weakness (generalized)  Rationale for Evaluation and Treatment: Rehabilitation  SUBJECTIVE:                                                                                                                                                                                             SUBJECTIVE STATEMENT: Feels like he is not getting up straight enough when walking. Talked about this with Ihor Austin and she was suggesting a taller walker. Has  been trying to work on his posture exercises, but still feels too forward. Currently mainly using rollator at home and brings Strong Arm cane when going to restaurants or grocery store. No falls. Reports in bathroom, has a shower seat, but no grab bars. Looking into someone to come into bathroom and make it safer. Reports wife had shoulder surgery and had to help a lot around the house. Notes exercises are still going well, does 20-30 minutes on the bike and does stretches. Notes it is hard to get off the floor, but he is still able to do it.   Pt accompanied by: self, spouse in waiting room   PERTINENT HISTORY:  PD (dx 2019), HLD, cellulitis, lymphedema, low back pain, hearing loss   PAIN:  Are you having pain? No  PRECAUTIONS: Fall   FALLS: Has patient fallen in last 6 months? No  LIVING ENVIRONMENT: Lives with: lives with their spouse Lives in: House/apartment Stairs: No, only 1 step to enter in the front.  Has following equipment at home: Dan Humphreys - 2 wheeled, Environmental consultant - 4 wheeled, and [employer], Walk in shower, Shower chair   PLOF: Independent with community mobility with device  PATIENT GOALS: Wants to work on getting himself straighter and work on posture   OBJECTIVE:  Note: Objective measures were completed at Evaluation unless otherwise noted.  COGNITION: Overall cognitive status: Within functional limits for tasks assessed   SENSATION: Light touch: WFL Pt denies  numbness/tingling   COORDINATION: Heel to shin: slightly slower RLE  EDEMA:  Swelling in bilat ankles (pt reports taking Lasix), sometimes won't take it when coming to therapy or unfamiliar environment    POSTURE: rounded shoulders, forward head, increased thoracic kyphosis, posterior pelvic tilt, and flexed trunk    LOWER EXTREMITY MMT:    MMT Right Eval Left Eval  Hip flexion 4+ 4+  Hip extension    Hip abduction 4 4  Hip adduction 5 5  Hip internal rotation    Hip external rotation    Knee flexion 5 5  Knee extension 5 5  Ankle dorsiflexion 5 4  Ankle plantarflexion    Ankle inversion    Ankle eversion    (Blank rows = not tested)  BED MOBILITY:  Pt reports no difficulty   TRANSFERS: Assistive device utilized: None  Sit to stand: SBA Stand to sit: SBA Can perform without UE support, incr forward flexed posture in standing    GAIT: Gait pattern: step through pattern, decreased arm swing- Left, decreased stride length, decreased hip/knee flexion- Right, decreased hip/knee flexion- Left, Right foot flat, Left foot flat, lateral hip instability, and trunk flexed Distance walked: clinic distances  Assistive device utilized: Environmental consultant - 4 wheeled and Strong Arm Cane Level of assistance: SBA and CGA Comments: pt pushes rollator too far anteriorly with incr forward flexed posture during gait, noted some scissoring with RLE during gait, CGA at times when turning with cane   Pt reports that neurologist recommended that he try a more upright walker to help with posture. PT brought out upwalker with forearm armrests to trial for posture. Pt continued to have more forward posture and this walker was harder to turn due to it having a larger base. Pt ambulated approx. 67' with this walker. Pt reports that he did not notice a difference with this walker   FUNCTIONAL TESTS:  5 times sit to stand: 20.3 seconds with no UE support  Timed up and go (TUG): 16.5 seconds with Strong Arm  Cane, 17.5 seconds with rollator  10 meter walk test: 16.4 seconds with Strong Arm Cane = 2.0 ft/sec, 14.3 seconds with rollator = 2.29 ft/sec                                                                                                                               TREATMENT DATE:  N/A during eval      PATIENT EDUCATION: Education details: Clinical findings, POC, trial of upright walker for posture (did not make a noticeable difference), will pick up for a short bought of therapy to address posture/review and finalize HEP as needed  Person educated: Patient Education method: Explanation Education comprehension: verbalized understanding  HOME EXERCISE PROGRAM: [id number]  (from previous bout of therapy, will review/update as needed)   GOALS: Goals reviewed with patient? Yes  SHORT TERM GOALS: ALL STGS = LTGS  LONG TERM GOALS: Target date: 02/19/2024  Pt will be independent with final PD specific HEP to address gait, balance, posture.  Baseline:  Goal status: INITIAL  2.  Pt will improve 5x sit<>stand to less than or equal to 18 sec to demonstrate improved functional strength and transfer efficiency.   Baseline: 20.3 seconds with no UE support  Goal status: INITIAL    ASSESSMENT:  CLINICAL IMPRESSION: Patient is a 78 year old male referred to Neuro OPPT for PD. Pt is well known to this clinic and was last seen  in 07/2023.   Pt's PMH is significant for:  PD (dx 2019), HLD, cellulitis, lymphedema, low back pain, hearing loss . The following deficits were present during the exam:  imbalance, gait abnormalities, postural impairments, decr functional strength/mobility, impaired timing and coordination.  Based on outcome measures with 5x sit <> stand, TUG, and gait speed, pt's outcome measures have generally remained stable since pt was last here for PT. Will pick pt up for a short bout of therapy to work on posture for daily tasks/gait and finalizing/reviewing HEP.    OBJECTIVE  IMPAIRMENTS: Abnormal gait, decreased activity tolerance, decreased balance, decreased ROM, decreased strength, hypomobility, impaired flexibility, and postural dysfunction.   ACTIVITY LIMITATIONS: lifting, bending, squatting, stairs, transfers, and locomotion level  PARTICIPATION LIMITATIONS: community activity and yard work  PERSONAL FACTORS: Age, Past/current experiences, Time since onset of injury/illness/exacerbation, and 3+ comorbidities: PD (dx 2019), HLD, cellulitis, lymphedema, low back pain, hearing loss   are also affecting patient's functional outcome.   REHAB POTENTIAL: Good  CLINICAL DECISION MAKING: Stable/uncomplicated  EVALUATION COMPLEXITY: Low  PLAN:  PT FREQUENCY: 1x/week  PT DURATION: 4 weeks  PLANNED INTERVENTIONS: 97164- PT Re-evaluation, 97110-Therapeutic exercises, 97530- Therapeutic activity, 97112- Neuromuscular re-education, 97535- Self Care, 24401- Manual therapy, [phone]- Gait training, Patient/Family education, and Balance training  PLAN FOR NEXT SESSION: work on exercises for posture, update and review HEP as needed, work on strategies to help with posture during the day or modifications to postioning   Drake Leach, PT, DPT 01/22/2024, 4:02 PM

## 2024-01-22 ENCOUNTER — Ambulatory Visit: Payer: Medicare HMO | Attending: Adult Health | Admitting: Physical Therapy

## 2024-01-22 ENCOUNTER — Encounter: Payer: Self-pay | Admitting: Physical Therapy

## 2024-01-22 ENCOUNTER — Ambulatory Visit: Payer: Medicare HMO | Admitting: Occupational Therapy

## 2024-01-22 DIAGNOSIS — G20A1 Parkinson's disease without dyskinesia, without mention of fluctuations: Secondary | ICD-10-CM | POA: Insufficient documentation

## 2024-01-22 DIAGNOSIS — R29818 Other symptoms and signs involving the nervous system: Secondary | ICD-10-CM | POA: Insufficient documentation

## 2024-01-22 DIAGNOSIS — R2689 Other abnormalities of gait and mobility: Secondary | ICD-10-CM | POA: Diagnosis not present

## 2024-01-22 DIAGNOSIS — R293 Abnormal posture: Secondary | ICD-10-CM | POA: Insufficient documentation

## 2024-01-22 DIAGNOSIS — M6281 Muscle weakness (generalized): Secondary | ICD-10-CM | POA: Diagnosis not present

## 2024-01-22 DIAGNOSIS — R2681 Unsteadiness on feet: Secondary | ICD-10-CM | POA: Diagnosis not present

## 2024-01-22 DIAGNOSIS — R278 Other lack of coordination: Secondary | ICD-10-CM | POA: Diagnosis not present

## 2024-01-22 NOTE — Therapy (Signed)
 OUTPATIENT OCCUPATIONAL THERAPY PARKINSON'S EVALUATION AND DISCHARGE  Patient Name: [name] MRN: [id number] DOB:1945-11-20, 78 y.o., male Today's Date: 01/22/2024  PCP: Excell Seltzer, MD  REFERRING PROVIDER: Ihor Austin, NP  END OF SESSION:  OT End of Session - 01/22/24 1403     Visit Number 1    Authorization Type [employer] - auth required    OT Start Time 1403    OT Stop Time 1435    OT Time Calculation (min) 32 min    Activity Tolerance Patient tolerated treatment well    Behavior During Therapy WFL for tasks assessed/performed             Past Medical History:  Diagnosis Date   Agatston coronary artery calcium score greater than 400    Coronary calcium score of 533. This was 69th percentile for age,   Allergy 15 yrs. or more   mosquito or wasp bites bad reaction   Aortic atherosclerosis (HCC)    Cataract 10 yrs. or more   slow bloom checkup every 6 mos.   Hyperlipidemia LDL goal <70    Neuromuscular disorder (HCC) 09/2018   Parkinsons   Parkinson disease (HCC)    Sleep apnea 02/21/2021 sleep study   have Resair machine   Past Surgical History:  Procedure Laterality Date   TONSILLECTOMY     Patient Active Problem List   Diagnosis Date Noted   Agatston coronary artery calcium score greater than 400 02/02/2022   Aortic atherosclerosis (HCC) 02/01/2022   SVT (supraventricular tachycardia) (HCC) 01/12/2022   OSA (obstructive sleep apnea) 01/12/2022   Parkinson's disease (HCC) 11/25/2020   Edema of right lower leg 11/25/2020   Hyperlipidemia LDL goal <70 11/25/2020   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  Pt accompanied by: self  PERTINENT HISTORY: PD (dx 2019), HLD, cellulitis, lymphedema, low back pain, hearing loss   PRECAUTIONS: Fall  WEIGHT BEARING RESTRICTIONS: No  PAIN:  Are you having pain? No  FALLS: Has patient fallen in last 6 months? No  LIVING ENVIRONMENT: Lives with: lives with their spouse Lives in: House/apartment Stairs: Yes: External: 1 steps; none Has following equipment at home that he uses: Environmental consultant - 4 wheeled and strong arm cane ; CPAP; hearing aids  PLOF: Independent; retired professor; driving  PATIENT GOALS: maintain level of function  OBJECTIVE:   HAND DOMINANCE: Right  ADLs: Overall ADLs: mod I  UB Dressing: A with buttons  IADLs: Mostly mod I Handwriting: 100% legible and Mild micrographia  MOBILITY STATUS: Needs Assist: uses Rollator with SBA  POSTURE COMMENTS:  rounded shoulders, forward head, decreased lumbar lordosis, increased thoracic kyphosis, and posterior pelvic tilt  ACTIVITY TOLERANCE: Activity tolerance: good  FUNCTIONAL OUTCOME MEASURES: Fastening/unfastening 3 buttons: 26 sec Physical performance test: PPT#2 (simulated eating) 15 & PPT#4 (donning/doffing jacket): 21 sec with button up jacket with buttoned cuffs  COORDINATION: 9 Hole Peg test: Right: 28 sec; Left: 29 sec  UE ROM:  WFL  UE MMT:   WFL  SENSATION: [facility]  MUSCLE TONE: WFL  COGNITION: Overall cognitive status: Within functional limits for tasks assessed  OBSERVATIONS: Bradykinesia  TODAY'S TREATMENT:                                                                                                                               N/A This date  PATIENT EDUCATION: Education details: OT Role and POC Person educated: Patient Education method: Explanation Education  comprehension: verbalized understanding  HOME EXERCISE PROGRAM: None at this time  GOALS: N/A for this visit   ASSESSMENT:  CLINICAL IMPRESSION: Patient is a 78 y.o. male who was seen today for occupational therapy evaluation for PD. Hx also includes HLD, cellulitis, lymphedema, low back pain, and hearing loss. Patient currently presents at baseline level of functioning. Do not recommend skilled OT services at this time. Pt to continue with current HEP and recommendations.  PERFORMANCE DEFICITS: in functional skills including coordination, strength, Fine motor control, and UE functional use though managed since last therapy episode.  IMPAIRMENTS:  limited impairment .   COMORBIDITIES:  may have co-morbidities  that affects occupational performance. Patient will benefit from skilled OT to address above impairments and improve overall function.  MODIFICATION OR ASSISTANCE TO COMPLETE EVALUATION: Min-Moderate modification of tasks or assist with assess necessary to complete an evaluation.  OT OCCUPATIONAL PROFILE AND HISTORY: Problem focused assessment: Including review of records relating to presenting problem.  CLINICAL DECISION MAKING: LOW - limited treatment options, no task modification necessary  REHAB POTENTIAL: Good  EVALUATION COMPLEXITY: Low    PLAN:  OT FREQUENCY/DURATION: Evaluation only   Delana Meyer, OT 01/22/2024, 6:06 PM

## 2024-02-05 ENCOUNTER — Ambulatory Visit: Admitting: Physical Therapy

## 2024-02-05 DIAGNOSIS — R2681 Unsteadiness on feet: Secondary | ICD-10-CM

## 2024-02-05 DIAGNOSIS — M6281 Muscle weakness (generalized): Secondary | ICD-10-CM | POA: Diagnosis not present

## 2024-02-05 DIAGNOSIS — R2689 Other abnormalities of gait and mobility: Secondary | ICD-10-CM

## 2024-02-05 DIAGNOSIS — G20A1 Parkinson's disease without dyskinesia, without mention of fluctuations: Secondary | ICD-10-CM | POA: Diagnosis not present

## 2024-02-05 DIAGNOSIS — R293 Abnormal posture: Secondary | ICD-10-CM

## 2024-02-05 DIAGNOSIS — R29818 Other symptoms and signs involving the nervous system: Secondary | ICD-10-CM | POA: Diagnosis not present

## 2024-02-05 DIAGNOSIS — R278 Other lack of coordination: Secondary | ICD-10-CM | POA: Diagnosis not present

## 2024-02-05 NOTE — Patient Instructions (Signed)
   Place one yoga block on the shortest setting under your shoulder blades (bra line). Place the second yoga block on the middle or tallest position under your head.  Lie back on the blocks and hold for ~5 minutes. You may extend your arms out to the side for increased stretch

## 2024-02-05 NOTE — Therapy (Signed)
 OUTPATIENT PHYSICAL THERAPY NEURO TREATMENT   Patient Name: [name] MRN: [id number] DOB:10-14-1946, 78 y.o., male Today's Date: 02/05/2024   PCP: Excell Seltzer, MD  REFERRING PROVIDER: Ihor Austin, NP   END OF SESSION:  PT End of Session - 02/05/24 1355     Visit Number 2    Number of Visits 5    Date for PT Re-Evaluation 02/21/24    Authorization Type [employer] MEDICARE    PT Start Time 1352    PT Stop Time 1437    PT Time Calculation (min) 45 min    Equipment Utilized During Treatment --    Activity Tolerance Patient tolerated treatment well    Behavior During Therapy WFL for tasks assessed/performed              Past Medical History:  Diagnosis Date   Agatston coronary artery calcium score greater than 400    Coronary calcium score of 533. This was 69th percentile for age,   Allergy 15 yrs. or more   mosquito or wasp bites bad reaction   Aortic atherosclerosis (HCC)    Cataract 10 yrs. or more   slow bloom checkup every 6 mos.   Hyperlipidemia LDL goal <70    Neuromuscular disorder (HCC) 09/2018   Parkinsons   Parkinson disease (HCC)    Sleep apnea 02/21/2021 sleep study   have Resair machine   Past Surgical History:  Procedure Laterality Date   TONSILLECTOMY     Patient Active Problem List   Diagnosis Date Noted   Agatston coronary artery calcium score greater than 400 02/02/2022   Aortic atherosclerosis (HCC) 02/01/2022   SVT (supraventricular tachycardia) (HCC) 01/12/2022   OSA (obstructive sleep apnea) 01/12/2022   Parkinson's disease (HCC) 11/25/2020   Edema of right lower leg 11/25/2020   Hyperlipidemia LDL goal <70 11/25/2020   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   Hearing loss 11/25/2020   Pityriasis rosea 11/25/2020    ONSET DATE: 12/26/2023  REFERRING DIAG: G20.A1 (ICD-10-CM) - Parkinson's disease without dyskinesia or fluctuating manifestations (HCC)   THERAPY DIAG:  Muscle weakness (generalized)  Other abnormalities of  gait and mobility  Abnormal posture  Unsteadiness on feet  Rationale for Evaluation and Treatment: Rehabilitation  SUBJECTIVE:                                                                                                                                                                                             SUBJECTIVE STATEMENT: Pt reports doing well. No pain or acute changes to report.  Pt accompanied by: self, spouse in waiting room   PERTINENT HISTORY:  PD (dx 2019), HLD, cellulitis, lymphedema, low back pain, hearing loss   PAIN:  Are you having pain? No  PRECAUTIONS: Fall   FALLS: Has patient fallen in last 6 months? No  LIVING ENVIRONMENT: Lives with: lives with their spouse Lives in: House/apartment Stairs: No, only 1 step to enter in the front.  Has following equipment at home: Dan Humphreys - 2 wheeled, Environmental consultant - 4 wheeled, and [employer], Walk in shower, Shower chair   PLOF: Independent with community mobility with device  PATIENT GOALS: Wants to work on getting himself straighter and work on posture   OBJECTIVE:  Note: Objective measures were completed at Evaluation unless otherwise noted.  COGNITION: Overall cognitive status: Within functional limits for tasks assessed   SENSATION: Light touch: WFL Pt denies numbness/tingling   COORDINATION: Heel to shin: slightly slower RLE  EDEMA:  Swelling in bilat ankles (pt reports taking Lasix), sometimes won't take it when coming to therapy or unfamiliar environment    POSTURE: rounded shoulders, forward head, increased thoracic kyphosis, posterior pelvic tilt, and flexed trunk    LOWER EXTREMITY MMT:    MMT Right Eval Left Eval  Hip flexion 4+ 4+  Hip extension    Hip abduction 4 4  Hip adduction 5 5  Hip internal rotation    Hip external rotation    Knee flexion 5 5  Knee extension 5 5  Ankle dorsiflexion 5 4  Ankle plantarflexion    Ankle inversion    Ankle eversion    (Blank rows = not  tested)  BED MOBILITY:  Pt reports no difficulty   TRANSFERS: Assistive device utilized: None  Sit to stand: SBA Stand to sit: SBA Can perform without UE support, incr forward flexed posture in standing      FUNCTIONAL TESTS:  5 times sit to stand: 20.3 seconds with no UE support  Timed up and go (TUG): 16.5 seconds with Strong Arm Cane, 17.5 seconds with rollator  10 meter walk test: 16.4 seconds with Strong Arm Cane = 2.0 ft/sec, 14.3 seconds with rollator = 2.29 ft/sec                                                                                                                               TREATMENT DATE:  Ther Act/ Self-care  Verbally reviewed HEP from previous POC and pt has thorough postural exercises already. Noted pt not doing child's pose, so demonstrated this with pt on mat table and cued him to have feet off the bed when performing to alleviate ankle pain. Pt reported good stretch w/this  At doorway, added 90-90 single arm pec stretch to HEP (see bolded below). Pt reported good stretch w/this, w/decreased mobility noted on LUE > RUE. During stretch, noted a lump on pt's L upper trap that was not painful and pt unaware of. Upon further inspection, suspect a cyst. Informed pt's wife of lump and pt and wife report they will  contact dermatologist about it. Lump is easily mobile and soft, no discoloration noted.  Performed supine lying over small white foam roller, x3 minutes, for improved thoracic extension and postural control. Pt unable to extend head to mat table due to significant forward flexed posture, so regressed exercise to lying on yoga blocks (see photo below) and educated pt on where to purchase yoga blocks. Pt able to tolerate lying on blocks for >5 minutes, so added to HEP (under pt instructions)  Discussed pt's posture at home when reading, doing his crosswords, etc. And pt reports he often does these in his chair on his lap, further facilitating forward flexed  posture. Pt reports he will work on doing these at the table instead so he is working on posture.    GAIT: Gait pattern: step through pattern, decreased arm swing- Left, decreased stride length, decreased hip/knee flexion- Right, decreased hip/knee flexion- Left, Right foot flat, Left foot flat, lateral hip instability, and trunk flexed Distance walked: clinic distances  Assistive device utilized: Environmental consultant - 4 wheeled  Level of assistance: SBA  Comments: pt pushes rollator too far anteriorly with incr forward flexed posture during gait, noted some scissoring with RLE during gait  PATIENT EDUCATION: Education details: Updates to HEP, working on posture at home during ADLs  Person educated: Patient Education method: Explanation Education comprehension: verbalized understanding  HOME EXERCISE PROGRAM: Access Code: [email or web address] URL: [email or web address] Date: 02/05/2024 Prepared by: Alethia Berthold Zurich Carreno  Exercises - Wall push up with strong push away  - 1 x daily - 7 x weekly - 3 sets - 10 reps - Quadruped Thoracic Rotation - Reach Under  - 1 x daily - 7 x weekly - 3 sets - 10 reps - Half-Kneeling Forward Lean  - 1 x daily - 7 x weekly - 3 sets - 10 reps - Quadruped rock back  - 1 x daily - 7 x weekly - 3 sets - 10 reps - Dead Bug  - 1 x daily - 7 x weekly - 3 sets - 10 reps - Quadruped Fire Hydrant  - 1 x daily - 7 x weekly - 3 sets - 10 reps - Modified Superman on Table  - 1 x daily - 7 x weekly - 3 sets - 10 reps - 2second hold - Correct Standing Posture  - 1 x daily - 5 x weekly - 30 hold - Standing Balance with Eyes Closed on Foam  - 1 x daily - 5 x weekly - 3 sets - 30 hold - Standing march with overhead hold   - 1 x daily - 7 x weekly - 3 sets - 10 reps - Seated Ankle Alphabet  - 1 x daily - 7 x weekly - 3 sets - 10 reps - Ankle Inversion with Resistance  - 1 x daily - 7 x weekly - 3 sets - 10 reps - Ankle Eversion with Resistance  - 1 x daily - 7 x weekly - 3 sets - 10  reps - Ankle Dorsiflexion with Resistance  - 1 x daily - 7 x weekly - 3 sets - 10 reps - Ankle and Toe Plantarflexion with Resistance  - 1 x daily - 7 x weekly - 3 sets - 10 reps - Calf stretch  - 1 x daily - 7 x weekly - 3 sets - 10 reps - Heel to toe walking   - 1 x daily - 7 x weekly - 3 sets - 10 reps - Backward heel to toe walking   -  1 x daily - 7 x weekly - 3 sets - 10 reps - Half Tandem Stance with Head Nods on Foam Pad  - 1 x daily - 7 x weekly - 1 sets - 4 reps - 30-45 seconds hold - Supine Cervical Retraction with Towel  - 1 x daily - 7 x weekly - 3 sets - 10 reps - Seated Cervical Sidebending Stretch  - 1 x daily - 7 x weekly - 1 sets - 4 reps - 30 second hold - Modified Thomas Stretch  - 1 x daily - 7 x weekly - 3 sets - 1 hold - Single Arm Doorway Pec Stretch at 90 Degrees Abduction  - 1 x daily - 7 x weekly - 2-3 reps - 30-60 seconds  hold    Place one yoga block on the shortest setting under your shoulder blades (bra line). Place the second yoga block on the middle or tallest position under your head.  Lie back on the blocks and hold for ~5 minutes. You may extend your arms out to the side for increased stretch   GOALS: Goals reviewed with patient? Yes  SHORT TERM GOALS: ALL STGS = LTGS  LONG TERM GOALS: Target date: 02/19/2024  Pt will be independent with final PD specific HEP to address gait, balance, posture.  Baseline:  Goal status: INITIAL  2.  Pt will improve 5x sit<>stand to less than or equal to 18 sec to demonstrate improved functional strength and transfer efficiency.   Baseline: 20.3 seconds with no UE support  Goal status: INITIAL    ASSESSMENT:  CLINICAL IMPRESSION: Emphasis of skilled PT session on reviewing/revising HEP from previous POC to work on postural control. Pt has extensive list of exercises to address various deficits and has been faithful about performing them. Added a few stretches to HEP but strongly emphasized importance of his posture  during ADLs, as  he tends to work on his crosswords or read in his lap rather than sitting upright at a table. Pt reports he will work on this. Continue POC.    OBJECTIVE IMPAIRMENTS: Abnormal gait, decreased activity tolerance, decreased balance, decreased ROM, decreased strength, hypomobility, impaired flexibility, and postural dysfunction.   ACTIVITY LIMITATIONS: lifting, bending, squatting, stairs, transfers, and locomotion level  PARTICIPATION LIMITATIONS: community activity and yard work  PERSONAL FACTORS: Age, Past/current experiences, Time since onset of injury/illness/exacerbation, and 3+ comorbidities: PD (dx 2019), HLD, cellulitis, lymphedema, low back pain, hearing loss   are also affecting patient's functional outcome.   REHAB POTENTIAL: Good  CLINICAL DECISION MAKING: Stable/uncomplicated  EVALUATION COMPLEXITY: Low  PLAN:  PT FREQUENCY: 1x/week  PT DURATION: 4 weeks  PLANNED INTERVENTIONS: 97164- PT Re-evaluation, 97110-Therapeutic exercises, 97530- Therapeutic activity, 97112- Neuromuscular re-education, 97535- Self Care, 95621- Manual therapy, [phone]- Gait training, Patient/Family education, and Balance training  PLAN FOR NEXT SESSION: work on exercises for posture, update and review HEP as needed, work on strategies to help with posture during the day or modifications to postioning, thoracic paraspinal strength    Mazi Schuff E Deaire Mcwhirter, PT, DPT 02/05/2024, 2:38 PM

## 2024-02-07 DIAGNOSIS — D170 Benign lipomatous neoplasm of skin and subcutaneous tissue of head, face and neck: Secondary | ICD-10-CM | POA: Diagnosis not present

## 2024-02-12 ENCOUNTER — Encounter: Payer: Self-pay | Admitting: Physical Therapy

## 2024-02-12 ENCOUNTER — Ambulatory Visit: Attending: Adult Health | Admitting: Physical Therapy

## 2024-02-12 DIAGNOSIS — R2681 Unsteadiness on feet: Secondary | ICD-10-CM

## 2024-02-12 DIAGNOSIS — M6281 Muscle weakness (generalized): Secondary | ICD-10-CM

## 2024-02-12 DIAGNOSIS — R293 Abnormal posture: Secondary | ICD-10-CM

## 2024-02-12 DIAGNOSIS — R2689 Other abnormalities of gait and mobility: Secondary | ICD-10-CM | POA: Diagnosis not present

## 2024-02-12 NOTE — Therapy (Signed)
 OUTPATIENT PHYSICAL THERAPY NEURO TREATMENT   Patient Name: [name] MRN: [id number] DOB:September 10, 1946, 78 y.o., male Today's Date: 02/12/2024   PCP: Excell Seltzer, MD  REFERRING PROVIDER: Ihor Austin, NP   END OF SESSION:  PT End of Session - 02/12/24 1403     Visit Number 3    Number of Visits 5    Date for PT Re-Evaluation 02/21/24    Authorization Type [employer] MEDICARE    PT Start Time 1402    PT Stop Time 1443    PT Time Calculation (min) 41 min    Activity Tolerance Patient tolerated treatment well    Behavior During Therapy WFL for tasks assessed/performed              Past Medical History:  Diagnosis Date   Agatston coronary artery calcium score greater than 400    Coronary calcium score of 533. This was 69th percentile for age,   Allergy 15 yrs. or more   mosquito or wasp bites bad reaction   Aortic atherosclerosis (HCC)    Cataract 10 yrs. or more   slow bloom checkup every 6 mos.   Hyperlipidemia LDL goal <70    Neuromuscular disorder (HCC) 09/2018   Parkinsons   Parkinson disease (HCC)    Sleep apnea 02/21/2021 sleep study   have Resair machine   Past Surgical History:  Procedure Laterality Date   TONSILLECTOMY     Patient Active Problem List   Diagnosis Date Noted   Agatston coronary artery calcium score greater than 400 02/02/2022   Aortic atherosclerosis (HCC) 02/01/2022   SVT (supraventricular tachycardia) (HCC) 01/12/2022   OSA (obstructive sleep apnea) 01/12/2022   Parkinson's disease (HCC) 11/25/2020   Edema of right lower leg 11/25/2020   Hyperlipidemia LDL goal <70 11/25/2020   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   Hearing loss 11/25/2020   Pityriasis rosea 11/25/2020    ONSET DATE: 12/26/2023  REFERRING DIAG: G20.A1 (ICD-10-CM) - Parkinson's disease without dyskinesia or fluctuating manifestations (HCC)   THERAPY DIAG:  Muscle weakness (generalized)  Abnormal posture  Unsteadiness on feet  Other abnormalities of  gait and mobility  Rationale for Evaluation and Treatment: Rehabilitation  SUBJECTIVE:                                                                                                                                                                                             SUBJECTIVE STATEMENT: Got the yoga blocks and reports it seems to be helping. Has been doing his door stretch. Saw the dermatologist and was found to have a lipoma and will be checking it out  every 6 months.   Pt accompanied by: self, spouse in waiting room   PERTINENT HISTORY:  PD (dx 2019), HLD, cellulitis, lymphedema, low back pain, hearing loss   PAIN:  Are you having pain? No  PRECAUTIONS: Fall   FALLS: Has patient fallen in last 6 months? No  LIVING ENVIRONMENT: Lives with: lives with their spouse Lives in: House/apartment Stairs: No, only 1 step to enter in the front.  Has following equipment at home: Dan Humphreys - 2 wheeled, Environmental consultant - 4 wheeled, and [employer], Walk in shower, Shower chair   PLOF: Independent with community mobility with device  PATIENT GOALS: Wants to work on getting himself straighter and work on posture   OBJECTIVE:  Note: Objective measures were completed at Evaluation unless otherwise noted.  COGNITION: Overall cognitive status: Within functional limits for tasks assessed   SENSATION: Light touch: WFL Pt denies numbness/tingling   COORDINATION: Heel to shin: slightly slower RLE  EDEMA:  Swelling in bilat ankles (pt reports taking Lasix), sometimes won't take it when coming to therapy or unfamiliar environment    POSTURE: rounded shoulders, forward head, increased thoracic kyphosis, posterior pelvic tilt, and flexed trunk    LOWER EXTREMITY MMT:    MMT Right Eval Left Eval  Hip flexion 4+ 4+  Hip extension    Hip abduction 4 4  Hip adduction 5 5  Hip internal rotation    Hip external rotation    Knee flexion 5 5  Knee extension 5 5  Ankle dorsiflexion 5 4   Ankle plantarflexion    Ankle inversion    Ankle eversion    (Blank rows = not tested)  BED MOBILITY:  Pt reports no difficulty   TRANSFERS: Assistive device utilized: None  Sit to stand: SBA Stand to sit: SBA Can perform without UE support, incr forward flexed posture in standing    FUNCTIONAL TESTS:  5 times sit to stand: 20.3 seconds with no UE support  Timed up and go (TUG): 16.5 seconds with Strong Arm Cane, 17.5 seconds with rollator  10 meter walk test: 16.4 seconds with Strong Arm Cane = 2.0 ft/sec, 14.3 seconds with rollator = 2.29 ft/sec                                                                                                                               TREATMENT DATE:  Self-Care: Went over getting a reading stand to help with staying eye level when reading vs. Having incr forward flexed posture, showed pt where to purchase on [employer] over posture and positioning with daily tasks - pt reporting working on reading, crosswords in a sturdy chair instead of his recliner and bringing these tasks to eye level and feels like he has been having better posture with this   Therapeutic Exercise:  Performed exercises on mat table  Went over child's pose stretch, and walking hands to R and L, showed pt how to put  a yoga block under his forehead to help with positioning in the pose In quadruped performed 10 reps of cat/cows for spinal mobility and posture strengthening, verbal/tactile cues for technique and scapular protraction  Prone PWR up 10 reps, verbal/tactile cues for technique, pt enjoying this exercise  Prone PWR Rock 10 reps (5 reps each side), pt more challenged with staying up on forearms when performing this  With red theraband: 2 sets of 10 reps horizontal ABD in sitting with initial cues for technique and sitting up tall instead of leaning backwards, holding in scapular retraction for a few seconds  10 reps each side diagonals     GAIT: Gait pattern:  step through pattern, decreased arm swing- Left, decreased stride length, decreased hip/knee flexion- Right, decreased hip/knee flexion- Left, Right foot flat, Left foot flat, lateral hip instability, and trunk flexed Distance walked: clinic distances  Assistive device utilized: Environmental consultant - 4 wheeled  Level of assistance: SBA  Comments: pt pushes rollator too far anteriorly with incr forward flexed posture during gait, noted some scissoring with RLE during gait  PATIENT EDUCATION: Education details: Working on posture at home during ADLs, cat/cow and prone press up (prone PWR Up) to HEP for posture  Person educated: Patient Education method: Programmer, multimedia, Facilities manager, Verbal cues, and Handouts Education comprehension: verbalized understanding and returned demonstration  HOME EXERCISE PROGRAM: Access Code: [email or web address] URL: [email or web address] Date: 02/12/2024 Prepared by: Sherlie Ban  Exercises - Wall push up with strong push away  - 1 x daily - 7 x weekly - 3 sets - 10 reps - Quadruped Thoracic Rotation - Reach Under  - 1 x daily - 7 x weekly - 3 sets - 10 reps - Half-Kneeling Forward Lean  - 1 x daily - 7 x weekly - 3 sets - 10 reps - Quadruped rock back  - 1 x daily - 7 x weekly - 3 sets - 10 reps - Dead Bug  - 1 x daily - 7 x weekly - 3 sets - 10 reps - Quadruped Fire Hydrant  - 1 x daily - 7 x weekly - 3 sets - 10 reps - Modified Superman on Table  - 1 x daily - 7 x weekly - 3 sets - 10 reps - 2second hold - Correct Standing Posture  - 1 x daily - 5 x weekly - 30 hold - Standing Balance with Eyes Closed on Foam  - 1 x daily - 5 x weekly - 3 sets - 30 hold - Standing march with overhead hold   - 1 x daily - 7 x weekly - 3 sets - 10 reps - Seated Ankle Alphabet  - 1 x daily - 7 x weekly - 3 sets - 10 reps - Ankle Inversion with Resistance  - 1 x daily - 7 x weekly - 3 sets - 10 reps - Ankle Eversion with Resistance  - 1 x daily - 7 x weekly - 3 sets - 10 reps - Ankle  Dorsiflexion with Resistance  - 1 x daily - 7 x weekly - 3 sets - 10 reps - Ankle and Toe Plantarflexion with Resistance  - 1 x daily - 7 x weekly - 3 sets - 10 reps - Calf stretch  - 1 x daily - 7 x weekly - 3 sets - 10 reps - Heel to toe walking   - 1 x daily - 7 x weekly - 3 sets - 10 reps - Backward heel to toe walking   - 1  x daily - 7 x weekly - 3 sets - 10 reps - Half Tandem Stance with Head Nods on Foam Pad  - 1 x daily - 7 x weekly - 1 sets - 4 reps - 30-45 seconds hold - Supine Cervical Retraction with Towel  - 1 x daily - 7 x weekly - 3 sets - 10 reps - Seated Cervical Sidebending Stretch  - 1 x daily - 7 x weekly - 1 sets - 4 reps - 30 second hold - Modified Thomas Stretch  - 1 x daily - 7 x weekly - 3 sets - 1 hold - Single Arm Doorway Pec Stretch at 90 Degrees Abduction  - 1 x daily - 7 x weekly - 2-3 reps - 30-60 seconds  hold - Static Prone on Elbows  - 1 x daily - 7 x weekly - 2 sets - 10 reps - Cat Cow  - 1 x daily - 7 x weekly - 1 sets - 10 reps    Place one yoga block on the shortest setting under your shoulder blades (bra line). Place the second yoga block on the middle or tallest position under your head.  Lie back on the blocks and hold for ~5 minutes. You may extend your arms out to the side for increased stretch   GOALS: Goals reviewed with patient? Yes  SHORT TERM GOALS: ALL STGS = LTGS  LONG TERM GOALS: Target date: 02/19/2024  Pt will be independent with final PD specific HEP to address gait, balance, posture.  Baseline:  Goal status: INITIAL  2.  Pt will improve 5x sit<>stand to less than or equal to 18 sec to demonstrate improved functional strength and transfer efficiency.   Baseline: 20.3 seconds with no UE support  Goal status: INITIAL    ASSESSMENT:  CLINICAL IMPRESSION: Today's skilled session focused on emphasizing importance of posture during daily tasks like reading and crosswords and showed pt where to purchase a reading stand from [employer].  Remainder of session focused on posture exercises in quadruped, prone, and sitting. Added cat/cow and prone press up to HEP for additional postural strengthening as pt enjoyed these exercises and pt felt like that could be very beneficial . Will continue per POC.    OBJECTIVE IMPAIRMENTS: Abnormal gait, decreased activity tolerance, decreased balance, decreased ROM, decreased strength, hypomobility, impaired flexibility, and postural dysfunction.   ACTIVITY LIMITATIONS: lifting, bending, squatting, stairs, transfers, and locomotion level  PARTICIPATION LIMITATIONS: community activity and yard work  PERSONAL FACTORS: Age, Past/current experiences, Time since onset of injury/illness/exacerbation, and 3+ comorbidities: PD (dx 2019), HLD, cellulitis, lymphedema, low back pain, hearing loss   are also affecting patient's functional outcome.   REHAB POTENTIAL: Good  CLINICAL DECISION MAKING: Stable/uncomplicated  EVALUATION COMPLEXITY: Low  PLAN:  PT FREQUENCY: 1x/week  PT DURATION: 4 weeks  PLANNED INTERVENTIONS: 97164- PT Re-evaluation, 97110-Therapeutic exercises, 97530- Therapeutic activity, 97112- Neuromuscular re-education, 97535- Self Care, 16109- Manual therapy, [phone]- Gait training, Patient/Family education, and Balance training  PLAN FOR NEXT SESSION: work on exercises for posture, update and review HEP as needed, work on strategies to help with posture during the day or modifications to postioning, thoracic paraspinal strength    Drake Leach, PT, DPT 02/12/2024, 2:44 PM

## 2024-02-19 ENCOUNTER — Encounter: Payer: Self-pay | Admitting: Physical Therapy

## 2024-02-19 ENCOUNTER — Ambulatory Visit: Admitting: Physical Therapy

## 2024-02-19 DIAGNOSIS — R2681 Unsteadiness on feet: Secondary | ICD-10-CM

## 2024-02-19 DIAGNOSIS — R2689 Other abnormalities of gait and mobility: Secondary | ICD-10-CM

## 2024-02-19 DIAGNOSIS — M6281 Muscle weakness (generalized): Secondary | ICD-10-CM | POA: Diagnosis not present

## 2024-02-19 DIAGNOSIS — R293 Abnormal posture: Secondary | ICD-10-CM

## 2024-02-19 NOTE — Therapy (Signed)
 OUTPATIENT PHYSICAL THERAPY NEURO TREATMENT/DISCHARGE SUMMARY   Patient Name: [name] MRN: [id number] DOB:13-Jun-1946, 78 y.o., male Today's Date: 02/19/2024   PCP: Excell Seltzer, MD  REFERRING PROVIDER: Ihor Austin, NP   END OF SESSION:  PT End of Session - 02/19/24 1401     Visit Number 4    Number of Visits 5    Date for PT Re-Evaluation 02/21/24    Authorization Type [employer] MEDICARE    PT Start Time 1400    PT Stop Time 1432   full time not used due to D/C visit   PT Time Calculation (min) 32 min    Activity Tolerance Patient tolerated treatment well    Behavior During Therapy WFL for tasks assessed/performed               Past Medical History:  Diagnosis Date   Agatston coronary artery calcium score greater than 400    Coronary calcium score of 533. This was 69th percentile for age,   Allergy 15 yrs. or more   mosquito or wasp bites bad reaction   Aortic atherosclerosis (HCC)    Cataract 10 yrs. or more   slow bloom checkup every 6 mos.   Hyperlipidemia LDL goal <70    Neuromuscular disorder (HCC) 09/2018   Parkinsons   Parkinson disease (HCC)    Sleep apnea 02/21/2021 sleep study   have Resair machine   Past Surgical History:  Procedure Laterality Date   TONSILLECTOMY     Patient Active Problem List   Diagnosis Date Noted   Agatston coronary artery calcium score greater than 400 02/02/2022   Aortic atherosclerosis (HCC) 02/01/2022   SVT (supraventricular tachycardia) (HCC) 01/12/2022   OSA (obstructive sleep apnea) 01/12/2022   Parkinson's disease (HCC) 11/25/2020   Edema of right lower leg 11/25/2020   Hyperlipidemia LDL goal <70 11/25/2020   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   Hearing loss 11/25/2020   Pityriasis rosea 11/25/2020    ONSET DATE: 12/26/2023  REFERRING DIAG: G20.A1 (ICD-10-CM) - Parkinson's disease without dyskinesia or fluctuating manifestations (HCC)   THERAPY DIAG:  Muscle weakness (generalized)  Abnormal  posture  Unsteadiness on feet  Other abnormalities of gait and mobility  Rationale for Evaluation and Treatment: Rehabilitation  SUBJECTIVE:                                                                                                                                                                                             SUBJECTIVE STATEMENT: Got the yoga blocks and they feel really good for his posture. Wants to get timed again on the balance exercise with  his eyes closed. Ordered a reading stand on [employer]   Pt accompanied by: self, spouse in waiting room   PERTINENT HISTORY:  PD (dx 2019), HLD, cellulitis, lymphedema, low back pain, hearing loss   PAIN:  Are you having pain? No  PRECAUTIONS: Fall   FALLS: Has patient fallen in last 6 months? No  LIVING ENVIRONMENT: Lives with: lives with their spouse Lives in: House/apartment Stairs: No, only 1 step to enter in the front.  Has following equipment at home: Dan Humphreys - 2 wheeled, Environmental consultant - 4 wheeled, and [employer], Walk in shower, Shower chair   PLOF: Independent with community mobility with device  PATIENT GOALS: Wants to work on getting himself straighter and work on posture   OBJECTIVE:  Note: Objective measures were completed at Evaluation unless otherwise noted.  COGNITION: Overall cognitive status: Within functional limits for tasks assessed   SENSATION: Light touch: WFL Pt denies numbness/tingling   COORDINATION: Heel to shin: slightly slower RLE  EDEMA:  Swelling in bilat ankles (pt reports taking Lasix), sometimes won't take it when coming to therapy or unfamiliar environment    POSTURE: rounded shoulders, forward head, increased thoracic kyphosis, posterior pelvic tilt, and flexed trunk    LOWER EXTREMITY MMT:    MMT Right Eval Left Eval  Hip flexion 4+ 4+  Hip extension    Hip abduction 4 4  Hip adduction 5 5  Hip internal rotation    Hip external rotation    Knee flexion 5 5  Knee  extension 5 5  Ankle dorsiflexion 5 4  Ankle plantarflexion    Ankle inversion    Ankle eversion    (Blank rows = not tested)  BED MOBILITY:  Pt reports no difficulty   TRANSFERS: Assistive device utilized: None  Sit to stand: SBA Stand to sit: SBA Can perform without UE support, incr forward flexed posture in standing    FUNCTIONAL TESTS:  5 times sit to stand: 20.3 seconds with no UE support  Timed up and go (TUG): 16.5 seconds with Strong Arm Cane, 17.5 seconds with rollator  10 meter walk test: 16.4 seconds with Strong Arm Cane = 2.0 ft/sec, 14.3 seconds with rollator = 2.29 ft/sec                                                                                                                               TREATMENT DATE:  Therapeutic Activity:  5x sit <> stand: 20.8 seconds with no UE support  Practiced balance with EC on air ex: initially with feet more narrow > feet hip width distance, multiple reps of 10 second holds, pt frequently losing balance posteriorly into wall. Pt getting frustrated performing this one at home, discussed can use wall/chair as needed for balance and can perform with wider BOS vs. Having more narrow BOS  Reviewed standing tall against the wall for posture, cues for cervical retraction for posterior chain strengthening vs. Just extending  neck back, pt able to demo understanding  Gave updated community resources handout for PD with pt potentially interested in going to some of these classes Discussed will schedule PD screens in 6 months    Performed exercises on mat table  Reviewed from adding to HEP in past couple of visits:  Went over child's pose stretch 3 x 30 seconds  In quadruped performed 10 reps of cat/cows for spinal mobility and posture strengthening, verbal/tactile cues for technique and scapular protraction  Prone PWR up 10 reps, verbal/tactile cues for technique to help work on posture and posterior chain strengthening       GAIT: Gait pattern: step through pattern, decreased arm swing- Left, decreased stride length, decreased hip/knee flexion- Right, decreased hip/knee flexion- Left, Right foot flat, Left foot flat, lateral hip instability, and trunk flexed Distance walked: clinic distances  Assistive device utilized: Environmental consultant - 4 wheeled  Level of assistance: SBA  Comments: pt pushes rollator too far anteriorly with incr forward flexed posture during gait, noted some scissoring with RLE during gait    PHYSICAL THERAPY DISCHARGE SUMMARY  Visits from Start of Care: 4  Current functional level related to goals / functional outcomes: Mod I mobility with rollator    Remaining deficits: Forward flexed posture, balance impairments, fall risk, gait abnormalities, decr functional strength    Education / Equipment: HEP, updated community resources    Patient agrees to discharge. Patient goals were partially met. Patient is being discharged due to meeting the stated rehab goals. And reaching maximum potential with PT at this time due to stable outcome measures.   PATIENT EDUCATION: Education details: See therapeutic activity section above, D/C from PT, getting scheduled for PD screens in 6 months  Person educated: Patient Education method: Explanation, Demonstration, Verbal cues, and Handouts Education comprehension: verbalized understanding and returned demonstration  HOME EXERCISE PROGRAM: Access Code: [email or web address] URL: [email or web address] Date: 02/12/2024 Prepared by: Sherlie Ban  Exercises - Wall push up with strong push away  - 1 x daily - 7 x weekly - 3 sets - 10 reps - Quadruped Thoracic Rotation - Reach Under  - 1 x daily - 7 x weekly - 3 sets - 10 reps - Half-Kneeling Forward Lean  - 1 x daily - 7 x weekly - 3 sets - 10 reps - Quadruped rock back  - 1 x daily - 7 x weekly - 3 sets - 10 reps - Dead Bug  - 1 x daily - 7 x weekly - 3 sets - 10 reps - Quadruped Fire Hydrant   - 1 x daily - 7 x weekly - 3 sets - 10 reps - Modified Superman on Table  - 1 x daily - 7 x weekly - 3 sets - 10 reps - 2second hold - Correct Standing Posture  - 1 x daily - 5 x weekly - 30 hold - Standing Balance with Eyes Closed on Foam  - 1 x daily - 5 x weekly - 3 sets - 30 hold - Standing march with overhead hold   - 1 x daily - 7 x weekly - 3 sets - 10 reps - Seated Ankle Alphabet  - 1 x daily - 7 x weekly - 3 sets - 10 reps - Ankle Inversion with Resistance  - 1 x daily - 7 x weekly - 3 sets - 10 reps - Ankle Eversion with Resistance  - 1 x daily - 7 x weekly - 3 sets - 10 reps - Ankle Dorsiflexion with  Resistance  - 1 x daily - 7 x weekly - 3 sets - 10 reps - Ankle and Toe Plantarflexion with Resistance  - 1 x daily - 7 x weekly - 3 sets - 10 reps - Calf stretch  - 1 x daily - 7 x weekly - 3 sets - 10 reps - Heel to toe walking   - 1 x daily - 7 x weekly - 3 sets - 10 reps - Backward heel to toe walking   - 1 x daily - 7 x weekly - 3 sets - 10 reps - Half Tandem Stance with Head Nods on Foam Pad  - 1 x daily - 7 x weekly - 1 sets - 4 reps - 30-45 seconds hold - Supine Cervical Retraction with Towel  - 1 x daily - 7 x weekly - 3 sets - 10 reps - Seated Cervical Sidebending Stretch  - 1 x daily - 7 x weekly - 1 sets - 4 reps - 30 second hold - Modified Thomas Stretch  - 1 x daily - 7 x weekly - 3 sets - 1 hold - Single Arm Doorway Pec Stretch at 90 Degrees Abduction  - 1 x daily - 7 x weekly - 2-3 reps - 30-60 seconds  hold - Static Prone on Elbows  - 1 x daily - 7 x weekly - 2 sets - 10 reps - Cat Cow  - 1 x daily - 7 x weekly - 1 sets - 10 reps    Place one yoga block on the shortest setting under your shoulder blades (bra line). Place the second yoga block on the middle or tallest position under your head.  Lie back on the blocks and hold for ~5 minutes. You may extend your arms out to the side for increased stretch   GOALS: Goals reviewed with patient? Yes  SHORT TERM GOALS:  ALL STGS = LTGS  LONG TERM GOALS: Target date: 02/19/2024  Pt will be independent with final PD specific HEP to address gait, balance, posture.  Baseline: pt consistently performs his HEP  Goal status: MET  2.  Pt will improve 5x sit<>stand to less than or equal to 18 sec to demonstrate improved functional strength and transfer efficiency.   Baseline: 20.3 seconds with no UE support   20.8 seconds with no UE support on 2/8 Goal status: NOT MET    ASSESSMENT:  CLINICAL IMPRESSION: Assessed LTGs today with pt meeting LTG #1 in regards to HEP. Pt has been performing consistently. With this POC, worked on adding more posture exercises for posterior chain strengthening and mobility. Also educated on more upright posture during daily tasks like doing his crosswords and reading. Pt did not meet goal in regards to 5x sit <> stand with no UE support. Pt's time remained stable since eval. Did provide updated community resources for PD with pt potentially interested in exercise classes in the community. Pt is pleased with current functional level and with his current HEP to work on his deficits. Pt in agreement to D/C with plan for PD screens in 6 months.     OBJECTIVE IMPAIRMENTS: Abnormal gait, decreased activity tolerance, decreased balance, decreased ROM, decreased strength, hypomobility, impaired flexibility, and postural dysfunction.   ACTIVITY LIMITATIONS: lifting, bending, squatting, stairs, transfers, and locomotion level  PARTICIPATION LIMITATIONS: community activity and yard work  PERSONAL FACTORS: Age, Past/current experiences, Time since onset of injury/illness/exacerbation, and 3+ comorbidities: PD (dx 2019), HLD, cellulitis, lymphedema, low back pain, hearing loss  are also affecting patient's functional outcome.   REHAB POTENTIAL: Good  CLINICAL DECISION MAKING: Stable/uncomplicated  EVALUATION COMPLEXITY: Low  PLAN:  PT FREQUENCY: 1x/week  PT DURATION: 4  weeks  PLANNED INTERVENTIONS: 97164- PT Re-evaluation, 97110-Therapeutic exercises, 97530- Therapeutic activity, [id number]- Neuromuscular re-education, 97535- Self Care, 16109- Manual therapy, [phone]- Gait training, Patient/Family education, and Balance training  PLAN FOR NEXT SESSION: D/C from PT    Drake Leach, PT, DPT 02/19/2024, 2:35 PM

## 2024-02-26 ENCOUNTER — Ambulatory Visit: Admitting: Physical Therapy

## 2024-02-28 ENCOUNTER — Ambulatory Visit (INDEPENDENT_AMBULATORY_CARE_PROVIDER_SITE_OTHER): Admitting: Family Medicine

## 2024-02-28 ENCOUNTER — Encounter: Payer: Self-pay | Admitting: Family Medicine

## 2024-02-28 VITALS — BP 118/60 | HR 74 | Temp 98.2°F | Ht 69.0 in | Wt 240.2 lb

## 2024-02-28 DIAGNOSIS — G20B1 Parkinson's disease with dyskinesia, without mention of fluctuations: Secondary | ICD-10-CM | POA: Diagnosis not present

## 2024-02-28 NOTE — Assessment & Plan Note (Addendum)
 Chronic, good control of tremor with current medication plan.  Patient concerned about continued poor balance and poor posture.  He has recently completed physical therapy and plans to continue home physical therapy.  He is interested in new research/treatment options in Parkinson's disease.  He requests referral to Dr. Winferd Hatter who specifically treats movement disorders including Parkinson's. She has been recommended to him.

## 2024-02-28 NOTE — Progress Notes (Signed)
 Patient ID: [name], male    DOB: [birth year], 78 y.o.   MRN: [id number]  This visit was conducted in person.  BP 118/60   Pulse 74   Temp 98.2 F (36.8 C) (Oral)   Ht 5\' 9"  (1.753 m)   Wt 240 lb 3.2 oz (109 kg)   SpO2 96%   BMI 35.47 kg/m    CC:  Chief Complaint  Patient presents with   Parkinson Concern     Patient sees Dr. Arbutus Leas and he is states that the last few times he has seen the NP so he is thinking that he may need to be referred to someone else    Subjective:   HPI: [name] is a 78 y.o. male presenting on 02/28/2024 for Parkinson Concern  (Patient sees Dr. Arbutus Leas and he is states that the last few times he has seen the NP so he is thinking that he may need to be referred to someone else)  Parkinson's disease: Diagnosed in 2019 initially symptoms consisting of changes in voice, changes noted in facial expression, posture and gait changes and hand tremor.  Followed by neurology Dr. Frances Furbish... planning on changing Neurologists On Sinemet 25/100 mg p.o. 3 times daily.  Doing physical therapy.   Reviewed  12/26/2023 OV note with [name]  Dr. Frances Furbish [facility]) per pt is more of a sleep specialist than a movement MD.  He is interested in  more uptodate info, research studies etc.    No tremor, issues with balance posture.  No recent falls.    PT noted lipoma on back.. evaluated by Dermatology.  Relevant past medical, surgical, family and social history reviewed and updated as indicated. Interim medical history since our last visit reviewed. Allergies and medications reviewed and updated. Outpatient Medications Prior to Visit  Medication Sig Dispense Refill   ASPIRIN 81 PO Take by mouth daily.     carbidopa-levodopa (SINEMET IR) 25-100 MG tablet Take 1 tablet by mouth 3 (three) times daily. 270 tablet 3   cetirizine (ZYRTEC ALLERGY) 10 MG tablet Take 1 tablet (10 mg total) by mouth daily. 90 tablet 3   Coenzyme Q10 (COQ10 PO) Take by mouth. 1 per  day     furosemide (LASIX) 20 MG tablet Take 0.5 tablets (10 mg total) by mouth daily. 45 tablet 3   glucosamine-chondroitin 500-400 MG tablet Take 1 tablet by mouth daily.     ketoconazole (NIZORAL) 2 % cream Apply 1 Application topically daily. 60 g 0   ketoconazole (NIZORAL) 2 % shampoo Apply 1 Application topically 2 (two) times a week.     metoprolol succinate (TOPROL-XL) 25 MG 24 hr tablet Take 1 tablet by mouth once daily 90 tablet 3   metroNIDAZOLE (METROCREAM) 0.75 % cream Apply 1 Application topically daily.     Multiple Vitamins-Minerals (MULTIVITAMIN WITH MINERALS) tablet Take 1 tablet by mouth daily.     mupirocin ointment (BACTROBAN) 2 % Apply topically as needed.     nystatin (MYCOSTATIN/NYSTOP) powder Apply 1 Application topically 3 (three) times daily. 15 g 0   pramipexole (MIRAPEX) 1 MG tablet Take 1 tablet (1 mg total) by mouth in the morning, at noon, and at bedtime. 270 tablet 3   rosuvastatin (CRESTOR) 10 MG tablet Take 1 tablet by mouth once daily 90 tablet 3   No facility-administered medications prior to visit.     Per HPI unless specifically indicated in ROS section below Review of Systems  Constitutional:  Negative for fatigue and fever.  HENT:  Negative for ear pain.   Eyes:  Negative for pain.  Respiratory:  Negative for cough and shortness of breath.   Cardiovascular:  Negative for chest pain, palpitations and leg swelling.  Gastrointestinal:  Negative for abdominal pain.  Genitourinary:  Negative for dysuria.  Musculoskeletal:  Negative for arthralgias.  Neurological:  Negative for syncope, light-headedness and headaches.  Psychiatric/Behavioral:  Negative for dysphoric mood.    Objective:  BP 118/60   Pulse 74   Temp 98.2 F (36.8 C) (Oral)   Ht 5\' 9"  (1.753 m)   Wt 240 lb 3.2 oz (109 kg)   SpO2 96%   BMI 35.47 kg/m   Wt Readings from Last 3 Encounters:  02/28/24 240 lb 3.2 oz (109 kg)  12/26/23 240 lb 9.6 oz (109.1 kg)  10/26/23 244 lb (110.7  kg)      Physical Exam Constitutional:      Appearance: He is well-developed.     Comments: Using rolling walker  HENT:     Head: Normocephalic.     Right Ear: Hearing normal.     Left Ear: Hearing normal.     Nose: Nose normal.  Neck:     Thyroid: No thyroid mass or thyromegaly.     Vascular: No carotid bruit.     Trachea: Trachea normal.  Cardiovascular:     Rate and Rhythm: Normal rate and regular rhythm.     Pulses: Normal pulses.     Heart sounds: Heart sounds not distant. No murmur heard.    No friction rub. No gallop.  Pulmonary:     Effort: Pulmonary effort is normal. No respiratory distress.     Breath sounds: Normal breath sounds.  Skin:    General: Skin is warm and dry.     Findings: No rash.  Neurological:     Mental Status: He is alert and oriented to person, place, and time.     Cranial Nerves: Cranial nerves 2-12 are intact.     Sensory: Sensation is intact.     Motor: Atrophy and abnormal muscle tone present. No tremor.     Coordination: Coordination abnormal.     Gait: Gait abnormal.  Psychiatric:        Attention and Perception: Attention normal.        Mood and Affect: Affect is flat.        Speech: Speech normal.        Behavior: Behavior normal.        Thought Content: Thought content normal.        Cognition and Memory: Cognition normal.        Judgment: Judgment normal.       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  Patient concerned about continued poor balance and poor posture.  He has  recently completed physical therapy and plans to continue home physical therapy.  He is interested in new research/treatment options in Parkinson's disease.  He requests referral to  Dr. Winferd Hatter who specifically treats movement disorders including Parkinson's. She has been recommended to him.   Orders: -     Ambulatory referral to Neurology    No follow-ups on file.   Herby Lolling, MD

## 2024-03-03 NOTE — Progress Notes (Unsigned)
 Assessment/Plan:   1.  Parkinsonism  - Patient does not meet criteria today for idiopathic Parkinson's disease, but he was seen today on both pramipexole  and levodopa .  These can certainly cover the symptoms of Parkinson's disease.  He does had a bit of facial bradykinesia.  He and I discussed that it would be valuable to see him off of his Parkinson's medication, and I plan to bring him back in the next month off of pramipexole  and levodopa .  - We discussed that the patient could have Parkinson's disease when I see him off of medication.  He also could have vascular parkinsonism, but some of his walking issues certainly look orthopedic/structural in nature.   Patient apparently had an abnormal DaTscan in September, 2019 in [place], [place] .  I have a copy of the report.  It does state that there was asymmetrically diminished putamen activity, but it does not tell me which putamen had the decreased uptake and in fact it says "asymmetrically diminished in the putamen of both hemispheres."  Depending on what happens when I see him off of medication, we may decide to proceed with a skin biopsy.  - Patient has not seen decline of his diagnosed Parkinson's disease over the years.  His medications have not changed over the years and he has no sensation off on/off.  2.  Sleep apnea, overall mild  - Patient had nocturnal polysomnogram in 2022 at [facility] neurology with AHI of 5.6, indicative of very mild sleep apnea.  Overall, the patient feels markedly better on CPAP and is faithful with it.  Subjective:   [name] was seen today in the movement disorders clinic for neurologic consultation at the request of Cherlyn Cornet, Amy E, MD.  The consultation is for the evaluation of Parkinsons disease.  Outside records that were made available to me were reviewed.  This patient is accompanied in the office by his spouse who supplements the history. Patient has been under the care of Dr. Omar Bibber since 2022, but  has mostly seen the nurse practitioner at their office.  He had also previously been under the care of a neurologist in [place] , where he was actually diagnosed with Parkinsons disease.  When he first saw Dr. Omar Bibber in March, 2022, he was on carbidopa /levodopa  25/100, 1 tablet 3 times per day and pramipexole  1 mg 3 times a day from his prior neurologist in [place] .  He remains on the same medications today.  He takes those at 7am/noon/6-7pm.  He cannot tell when they wore off.  After her first visit, Dr. Omar Bibber ordered a sleep study.  There was very mild sleep apnea with an apnea-hypopnea index of 5.6.  CPAP was recommended.  He is currently on CPAP.  He was last seen at [facility] neurology on December 25, 2022.  Prior to moving to [place] , the patient had been on rasagiline in early 2020, but it was stopped because of cost.  He was also on ropinirole but it was stopped because of drowsiness.  He was on Rytary  but that was stopped because of cost.  He apparently had a DaTscan while done in [place]  in September, 2019.  I have a copy of that report.  This stated that it did show asymmetrically diminished uptake in the putamen of both hemispheres (unclear where the asymmetry was) with normal activity at the caudate.  Wife states that first sx's were shuffle and change in facial expression and inention tremor.  Pt doesn't think that sx's have changed  much since dx.     Specific Symptoms:  Tremor: No., states that he previously only had tremor when holding a paper.   Family hx of similar:  mother had ET; sister has tremor Voice: always been soft spoken and still is but wife thinks that he is a bit softer Sleep: sleep well with cpap  Vivid Dreams:  No.  Acting out dreams:  No. Drooling:  a little Postural symptoms:  Yes.    Falls?  No. Bradykinesia symptoms: slow movements and difficulty getting out of a chair; some trouble in the turn Loss of smell:  Yes.   Loss of taste:  No. Urinary  Incontinence:  has urgency with Lasix  Difficulty Swallowing:  No. Handwriting, micrographia: "a little smaller" Trouble with ADL's:  No.  Trouble buttoning clothing: No. Depression:  No. Memory changes:  No. Hallucinations:  No.  visual distortions: No. N/V:  No. Lightheaded:  No.  Syncope: No. Diplopia:  No. Dyskinesia:  No.  Neuroimaging of the brain has not previously been performed.    PREVIOUS MEDICATIONS: Requip (drowsiness); rasagiline (stopped because of cost); rytary  (stopped because of cost)  ALLERGIES:   Allergies  Allergen Reactions   Bee Venom     CURRENT MEDICATIONS:  Current Meds  Medication Sig   ASPIRIN 81 PO Take by mouth daily.   carbidopa -levodopa  (SINEMET  IR) 25-100 MG tablet Take 1 tablet by mouth 3 (three) times daily.   cetirizine  (ZYRTEC  ALLERGY) 10 MG tablet Take 1 tablet (10 mg total) by mouth daily.   Coenzyme Q10 (COQ10 PO) Take by mouth. 1 per day   furosemide  (LASIX ) 20 MG tablet Take 0.5 tablets (10 mg total) by mouth daily.   glucosamine-chondroitin 500-400 MG tablet Take 1 tablet by mouth daily.   ketoconazole  (NIZORAL ) 2 % cream Apply 1 Application topically daily.   ketoconazole  (NIZORAL ) 2 % shampoo Apply 1 Application topically 2 (two) times a week.   metoprolol  succinate (TOPROL -XL) 25 MG 24 hr tablet Take 1 tablet by mouth once daily   metroNIDAZOLE (METROCREAM) 0.75 % cream Apply 1 Application topically daily.   Multiple Vitamins-Minerals (MULTIVITAMIN WITH MINERALS) tablet Take 1 tablet by mouth daily.   mupirocin ointment (BACTROBAN) 2 % Apply topically as needed.   nystatin  (MYCOSTATIN /NYSTOP ) powder Apply 1 Application topically 3 (three) times daily.   pramipexole  (MIRAPEX ) 1 MG tablet Take 1 tablet (1 mg total) by mouth in the morning, at noon, and at bedtime.   rosuvastatin  (CRESTOR ) 10 MG tablet Take 1 tablet by mouth once daily     Objective:   VITALS:   Vitals:   03/05/24 0949  BP: 116/60  Pulse: 72  SpO2: 93%   Weight: 235 lb 9.6 oz (106.9 kg)  Height: 6' (1.829 m)    GEN:  The patient appears stated age and is in NAD. HEENT:  Normocephalic, atraumatic.  The mucous membranes are moist. The superficial temporal arteries are without ropiness or tenderness. CV:  RRR Lungs:  CTAB Neck/HEME:  There are no carotid bruits bilaterally.  Neurological examination:  Orientation: The patient is alert and oriented x3.  Cranial nerves: There is good facial symmetry.  There is mild facial hypomimia.  Extraocular muscles are intact. The visual fields are full to confrontational testing. The speech is fluent and clear. Soft palate rises symmetrically and there is no tongue deviation. Hearing is intact to conversational tone. Sensation: Sensation is intact to light and pinprick throughout (facial, trunk, extremities). Vibration is intact at the bilateral big toe, albeit  decreased slightly. There is no extinction with double simultaneous stimulation. There is no sensory dermatomal level identified. Motor: Strength is 5/5 in the bilateral upper and lower extremities.   Shoulder shrug is equal and symmetric.  There is no pronator drift. Deep tendon reflexes: Deep tendon reflexes are 2/4 at the bilateral biceps, triceps, brachioradialis, patella and achilles. Plantar responses are downgoing bilaterally.  Movement examination: Tone: There is normal tone in the bilateral upper extremities.  The tone in the lower extremities is normal.  Abnormal movements: There is no rest tremor, even with distraction procedures.  There is no postural tremor.  There is intention tremor.  He has no trouble with Archimedes spirals.  Coordination:  There is no decremation with RAM's, although he does have slow toe taps bilaterally. Gait and Station: The patient pushes off to arise.  He is flexed at the waist with an antalgic gait with and without the walker.  He has a scoliotic curve.  He is unstable without the walker. I have reviewed and  interpreted the following labs independently   Chemistry      Component Value Date/Time   NA 141 10/18/2023 0724   K 4.1 10/18/2023 0724   CL 105 10/18/2023 0724   CO2 28 10/18/2023 0724   BUN 14 10/18/2023 0724   CREATININE 0.65 10/18/2023 0724      Component Value Date/Time   CALCIUM  9.7 10/18/2023 0724   ALKPHOS 30 (L) 10/18/2023 0724   AST 28 10/18/2023 0724   ALT 8 10/18/2023 0724   BILITOT 1.0 10/18/2023 0724      No results found for: "TSH" No results found for: "WBC", "HGB", "HCT", "MCV", "PLT"   Total time spent on today's visit was 60 minutes, including both face-to-face time and nonface-to-face time.  Time included that spent on review of records (prior notes available to me/labs/imaging if pertinent), discussing treatment and goals, answering patient's questions and coordinating care.  Cc:  Judithann Novas, MD

## 2024-03-05 ENCOUNTER — Encounter: Payer: Self-pay | Admitting: Neurology

## 2024-03-05 ENCOUNTER — Ambulatory Visit: Admitting: Neurology

## 2024-03-05 VITALS — BP 116/60 | HR 72 | Ht 72.0 in | Wt 235.6 lb

## 2024-03-05 DIAGNOSIS — G4733 Obstructive sleep apnea (adult) (pediatric): Secondary | ICD-10-CM

## 2024-03-05 DIAGNOSIS — G20C Parkinsonism, unspecified: Secondary | ICD-10-CM

## 2024-03-05 NOTE — Patient Instructions (Addendum)
 I will see you on 5/22.  Hold your pramipexole  and carbidopa /levodopa  36 hours prior to the appointment

## 2024-04-01 ENCOUNTER — Telehealth: Payer: Self-pay | Admitting: Neurology

## 2024-04-01 NOTE — Progress Notes (Signed)
 Assessment/Plan:   1.  Parkinsonism             - Patient seen today off of pramipexole  and levodopa  and he does not meet criteria for Parkinson's disease.  We decided to leave him off of the medications today.  He still does have facial bradykinesia, so we will continue to watch him given history of abnormal DaTscan.             - Patient apparently had an abnormal DaTscan in September, 2019 in [place], [place] .  I have a copy of the report.  It does state that there was asymmetrically diminished putamen activity, but it does not tell me which putamen had the decreased uptake and in fact it says "asymmetrically diminished in the putamen of both hemispheres."   - Discussed skin biopsy for alpha-synuclein, but both of us  decided to hold off on that and just see how he does wait and pursue that in the future, if needed.             - Patient has not seen decline of his diagnosed Parkinson's disease over the years.  His medications have not changed over the years and he has no sensation off on/off.  -we will do MRI brain to look for small vessel changes because of family ACT.    2.  Sleep apnea, overall mild             - Patient had nocturnal polysomnogram in 2022 at [facility] neurology with AHI of 5.6, indicative of very mild sleep apnea.  Overall, the patient feels markedly better on CPAP and is faithful with it.  3.  Scoliosis  - I do think that orthopedic/degenerative changes are responsible for some of the gait instability.  We will proceed with MRI lumbar and thoracic spine   Subjective:   [name] was seen today in follow up for parkinsonism.  Patient is with his wife who supplements the history.  I saw the patient about a month ago and he did not look particularly parkinsonian, but we recognize that his medications could have been covering symptoms.  He was seen on levodopa  and pramipexole .  We decided to bring him back today off of medication to see how he looks.  He has been  off of medication for nearly 3 days and feels good off of the medication.  One night he had a lot of leg motion but one night he slept a full 8 hours and "I have never done that!"  Ambulation has been the same.  He is biking for exercise.    They ask about back issues/scoliosis contributing balance trouble.  They ask about possible vascular parkinsonism.  He has never had MRI brain.     ALLERGIES:   Allergies  Allergen Reactions   Bee Venom     CURRENT MEDICATIONS:  Current Meds  Medication Sig   ASPIRIN 81 PO Take by mouth daily.   carbidopa -levodopa  (SINEMET  IR) 25-100 MG tablet Take 1 tablet by mouth 3 (three) times daily.   cetirizine  (ZYRTEC  ALLERGY) 10 MG tablet Take 1 tablet (10 mg total) by mouth daily.   Coenzyme Q10 (COQ10 PO) Take by mouth. 1 per day   furosemide  (LASIX ) 20 MG tablet Take 0.5 tablets (10 mg total) by mouth daily.   glucosamine-chondroitin 500-400 MG tablet Take 1 tablet by mouth daily.   ketoconazole  (NIZORAL ) 2 % cream Apply 1 Application topically daily.   ketoconazole  (NIZORAL ) 2 % shampoo  Apply 1 Application topically 2 (two) times a week.   metoprolol  succinate (TOPROL -XL) 25 MG 24 hr tablet Take 1 tablet by mouth once daily   metroNIDAZOLE (METROCREAM) 0.75 % cream Apply 1 Application topically daily.   Multiple Vitamins-Minerals (MULTIVITAMIN WITH MINERALS) tablet Take 1 tablet by mouth daily.   mupirocin ointment (BACTROBAN) 2 % Apply topically as needed.   nystatin  (MYCOSTATIN /NYSTOP ) powder Apply 1 Application topically 3 (three) times daily.   pramipexole  (MIRAPEX ) 1 MG tablet Take 1 tablet (1 mg total) by mouth in the morning, at noon, and at bedtime.   rosuvastatin  (CRESTOR ) 10 MG tablet Take 1 tablet by mouth once daily     Objective:   PHYSICAL EXAMINATION:    VITALS:   Vitals:   04/03/24 1052  BP: 116/60  Pulse: 71  SpO2: 98%  Weight: 237 lb (107.5 kg)    GEN:  The patient appears stated age and is in NAD. HEENT:   Normocephalic, atraumatic.  The mucous membranes are moist. The superficial temporal arteries are without ropiness or tenderness. CV:  RRR Lungs:  CTAB Neck/HEME:  There are no carotid bruits bilaterally.  Neurological examination:  Orientation: The patient is alert and oriented x3. Cranial nerves: There is good facial symmetry with facial hypomimia. The speech is fluent and clear. Soft palate rises symmetrically and there is no tongue deviation. Hearing is intact to conversational tone. Sensation: Sensation is intact to light touch throughout Motor: Strength is at least antigravity x4.  Movement examination: Tone: There is nl tone in the ue/le Abnormal movements: There is no rest tremor.  There is no postural tremor.  There is intention tremor. Coordination:  There is no decremation with RAM's, with any form of RAMS, including alternating supination and pronation of the forearm, hand opening and closing, finger taps, heel taps and toe taps.  He does have slow toe taps bilaterally, but no decremation. Gait and Station: The patient pushes off to arise.  Patient is flexed at the waist with an antalgic gait both with and without the walker.  He has a scoliotic curve.  He is unstable without the walker.  I have reviewed and interpreted the following labs independently    Chemistry      Component Value Date/Time   NA 141 10/18/2023 0724   K 4.1 10/18/2023 0724   CL 105 10/18/2023 0724   CO2 28 10/18/2023 0724   BUN 14 10/18/2023 0724   CREATININE 0.65 10/18/2023 0724      Component Value Date/Time   CALCIUM  9.7 10/18/2023 0724   ALKPHOS 30 (L) 10/18/2023 0724   AST 28 10/18/2023 0724   ALT 8 10/18/2023 0724   BILITOT 1.0 10/18/2023 0724       No results found for: "WBC", "HGB", "HCT", "MCV", "PLT"  No results found for: "TSH"   Total time spent on today's visit was 31 minutes, including both face-to-face time and nonface-to-face time.  Time included that spent on review of  records (prior notes available to me/labs/imaging if pertinent), discussing treatment and goals, answering patient's questions and coordinating care.  Cc:  Judithann Novas, MD

## 2024-04-01 NOTE — Telephone Encounter (Signed)
 Call patient and remind him to hold levodopa  and pramipexole  for his upcoming visit

## 2024-04-02 NOTE — Telephone Encounter (Signed)
 Called and sent mychart message

## 2024-04-03 ENCOUNTER — Ambulatory Visit: Admitting: Neurology

## 2024-04-03 ENCOUNTER — Encounter: Payer: Self-pay | Admitting: Neurology

## 2024-04-03 ENCOUNTER — Other Ambulatory Visit: Payer: Self-pay

## 2024-04-03 VITALS — BP 116/60 | HR 71 | Wt 237.0 lb

## 2024-04-03 DIAGNOSIS — G20C Parkinsonism, unspecified: Secondary | ICD-10-CM

## 2024-04-03 DIAGNOSIS — M419 Scoliosis, unspecified: Secondary | ICD-10-CM

## 2024-04-03 DIAGNOSIS — R2681 Unsteadiness on feet: Secondary | ICD-10-CM | POA: Diagnosis not present

## 2024-04-03 DIAGNOSIS — G214 Vascular parkinsonism: Secondary | ICD-10-CM

## 2024-04-03 DIAGNOSIS — M545 Low back pain, unspecified: Secondary | ICD-10-CM

## 2024-04-03 NOTE — Patient Instructions (Signed)
A referral to [facility] has been placed for your MRI someone will contact you directly to schedule your appt. They are located at [address] Please contact them directly by calling [phone] with any questions regarding your referral.  

## 2024-04-24 ENCOUNTER — Ambulatory Visit

## 2024-04-24 VITALS — BP 116/60 | Ht 72.0 in | Wt 237.0 lb

## 2024-04-24 DIAGNOSIS — Z0000 Encounter for general adult medical examination without abnormal findings: Secondary | ICD-10-CM

## 2024-04-24 DIAGNOSIS — Z2821 Immunization not carried out because of patient refusal: Secondary | ICD-10-CM

## 2024-04-24 NOTE — Progress Notes (Signed)
 Because this visit was a virtual/telehealth visit,  certain criteria was not obtained, such a blood pressure, CBG if applicable, and timed get up and go. Any medications not marked as taking were not mentioned during the medication reconciliation part of the visit. Any vitals not documented were not able to be obtained due to this being a telehealth visit or patient was unable to self-report a recent blood pressure reading due to a lack of equipment at home via telehealth. Vitals that have been documented are verbally provided by the patient.   This visit was performed by a medical professional under my direct supervision. I was immediately available for consultation/collaboration. I have reviewed and agree with the Annual Wellness Visit documentation.  Subjective:   [name] is a 78 y.o. who presents for a Medicare Wellness preventive visit.  As a reminder, Annual Wellness Visits don't include a physical exam, and some assessments may be limited, especially if this visit is performed virtually. We may recommend an in-person follow-up visit with your provider if needed.  Visit Complete: Virtual I connected with  [name] on 04/24/24 by a audio enabled telemedicine application and verified that I am speaking with the correct person using two identifiers.  Patient Location: Home  Provider Location: Home Office  I discussed the limitations of evaluation and management by telemedicine. The patient expressed understanding and agreed to proceed.  Vital Signs: Because this visit was a virtual/telehealth visit, some criteria may be missing or patient reported. Any vitals not documented were not able to be obtained and vitals that have been documented are patient reported.  VideoDeclined- This patient declined Librarian, academic. Therefore the visit was completed with audio only.  Persons Participating in Visit: Patient.  AWV Questionnaire: Yes: Patient Medicare  AWV questionnaire was completed by the patient on 04/22/2024; I have confirmed that all information answered by patient is correct and no changes since this date.  Cardiac Risk Factors include: advanced age (>15men, >85 women);male gender;dyslipidemia;obesity (BMI >30kg/m2);smoking/ tobacco exposure     Objective:    Today's Vitals   04/24/24 1437  BP: 116/60  Weight: 237 lb (107.5 kg)  Height: 6' (1.829 m)   Body mass index is 32.14 kg/m.     04/24/2024    2:41 PM 04/03/2024   10:52 AM 03/05/2024    9:50 AM 06/26/2023    2:05 PM 04/23/2023    3:36 PM 04/19/2022    8:59 AM 01/05/2022   11:06 AM  Advanced Directives  Does Patient Have a Medical Advance Directive? Yes Yes No Yes Yes No No  Type of Advance Directive Living will Living will  Healthcare Power of [place];Living will Healthcare Power of [place];Living will    Does patient want to make changes to medical advance directive? No - Patient declined   Yes (Inpatient - patient defers changing a medical advance directive and declines information at this time)     Copy of Healthcare Power of Attorney in Chart?    No - copy requested No - copy requested    Would patient like information on creating a medical advance directive?      Yes (MAU/Ambulatory/Procedural Areas - Information given) Yes (MAU/Ambulatory/Procedural Areas - Information given)    Current Medications (verified) Outpatient Encounter Medications as of 04/24/2024  Medication Sig   ASPIRIN 81 PO Take by mouth daily.   cetirizine  (ZYRTEC  ALLERGY) 10 MG tablet Take 1 tablet (10 mg total) by mouth daily.   Coenzyme Q10 (COQ10  PO) Take by mouth. 1 per day   furosemide  (LASIX ) 20 MG tablet Take 0.5 tablets (10 mg total) by mouth daily.   glucosamine-chondroitin 500-400 MG tablet Take 1 tablet by mouth daily.   ketoconazole  (NIZORAL ) 2 % cream Apply 1 Application topically daily.   ketoconazole  (NIZORAL ) 2 % shampoo Apply 1 Application topically 2 (two) times a week.    metoprolol  succinate (TOPROL -XL) 25 MG 24 hr tablet Take 1 tablet by mouth once daily   metroNIDAZOLE (METROCREAM) 0.75 % cream Apply 1 Application topically daily.   Multiple Vitamins-Minerals (MULTIVITAMIN WITH MINERALS) tablet Take 1 tablet by mouth daily.   mupirocin ointment (BACTROBAN) 2 % Apply topically as needed.   nystatin  (MYCOSTATIN /NYSTOP ) powder Apply 1 Application topically 3 (three) times daily.   rosuvastatin  (CRESTOR ) 10 MG tablet Take 1 tablet by mouth once daily   No facility-administered encounter medications on file as of 04/24/2024.    Allergies (verified) Bee venom   History: Past Medical History:  Diagnosis Date   Agatston coronary artery calcium  score greater than 400    Coronary calcium  score of 533. This was 69th percentile for age,   Allergy 15 yrs. or more   mosquito or wasp bites bad reaction   Aortic atherosclerosis (HCC)    Cataract 10 yrs. or more   slow bloom checkup every 6 mos.   Hyperlipidemia LDL goal <70    Neuromuscular disorder (HCC) 09/2018   Parkinsons   Parkinson disease (HCC)    Sleep apnea 02/21/2021 sleep study   have Resair machine   Past Surgical History:  Procedure Laterality Date   TONSILLECTOMY     Family History  Problem Relation Age of Onset   Arthritis Mother    Hearing loss Mother    Hypertension Mother    Parkinson's disease Mother    Prostate cancer Father    Hearing loss Father    Heart attack Father 21   Hyperlipidemia Father    Hypertension Sister    Heart disease Maternal Grandmother    Hypertension Maternal Grandmother    Arthritis Maternal Grandmother    Diabetes Daughter    Sleep apnea Neg Hx    Social History   Socioeconomic History   Marital status: Married    Spouse name: Ludwig Safer   Number of children: 1   Years of education: master's degree   Highest education level: Master's degree (e.g., MA, MS, MEng, MEd, MSW, MBA)  Occupational History   Occupation: retired    Comment: professor  -  music professor  Tobacco Use   Smoking status: Some Days    Types: Pipe   Smokeless tobacco: Never   Tobacco comments:    so little now not worth mentioning - cigar =special occasions, pipe also couple of times since move  Vaping Use   Vaping status: Never Used  Substance and Sexual Activity   Alcohol use: Yes    Alcohol/week: 10.0 standard drinks of alcohol    Types: 2 Glasses of wine, 6 Cans of beer, 2 Standard drinks or equivalent per week    Comment: 2 beers few days/week   Drug use: Never   Sexual activity: Yes    Birth control/protection: Post-menopausal  Other Topics Concern   Not on file  Social History Narrative   11/25/20   From: [place] , but from this area, moved back 2022   Living: with wife, Ludwig Safer (1980)   Work: retired Education officer, 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, played piano      Family:  Cristen in [place], 2 granddaughters       Enjoys: piano, read, crosswords      Exercise: stationary bike 40 min daily, PT for balance   Diet: good, yogurt, cereal, big meal at lunchtime, light dinner      Safety   Seat belts: Yes    Guns: no   Safe in relationships: Yes    Right handed    Social Drivers of Health   Financial Resource Strain: Low Risk  (04/24/2024)   Overall Financial Resource Strain (CARDIA)    Difficulty of Paying Living Expenses: Not hard at all  Food Insecurity: Unknown (04/24/2024)   Hunger Vital Sign    Worried About Running Out of Food in the Last Year: Never true    Ran Out of Food in the Last Year: Not on file  Transportation Needs: No Transportation Needs (04/24/2024)   PRAPARE - Administrator, Civil Service (Medical): No    Lack of Transportation (Non-Medical): No  Physical Activity: Sufficiently Active (04/24/2024)   Exercise Vital Sign    Days of Exercise per Week: 7 days    Minutes of Exercise per Session: 30 min  Stress: No Stress Concern Present (04/24/2024)   [employer] of Occupational Health -  Occupational Stress Questionnaire    Feeling of Stress: Not at all  Social Connections: Moderately Isolated (04/24/2024)   Social Connection and Isolation Panel    Frequency of Communication with Friends and Family: More than three times a week    Frequency of Social Gatherings with Friends and Family: Twice a week    Attends Religious Services: Never    Database administrator or Organizations: No    Attends Engineer, structural: Not on file    Marital Status: Married    Tobacco Counseling Ready to quit: Not Answered Counseling given: Not Answered Tobacco comments: so little now not worth mentioning - cigar =special occasions, pipe also couple of times since move    Clinical Intake:  Pre-visit preparation completed: Yes  Pain : No/denies pain     BMI - recorded: 32.14 Nutritional Status: BMI > 30  Obese Nutritional Risks: None Diabetes: No  Lab Results  Component Value Date   HGBA1C 5.3 10/26/2023     How often do you need to have someone help you when you read instructions, pamphlets, or other written materials from your doctor or pharmacy?: 1 - Never What is the last grade level you completed in school?: Masters  Interpreter Needed?: No  Information entered by :: [employer]   Activities of Daily Living     04/22/2024    9:06 AM  In your present state of health, do you have any difficulty performing the following activities:  Hearing? 0  Vision? 0  Difficulty concentrating or making decisions? 0  Walking or climbing stairs? 1  Dressing or bathing? 0  Doing errands, shopping? 0  Preparing Food and eating ? N  Using the Toilet? N  In the past six months, have you accidently leaked urine? Y  Do you have problems with loss of bowel control? N  Managing your Medications? N  Managing your Finances? N  Housekeeping or managing your Housekeeping? N    Patient Care Team: Judithann Novas, MD as PCP - General (Family Medicine) Jacqueline Matsu,  MD as PCP - Cardiology (Cardiology) Debbra Fairy, MD as Attending Physician (Neurology) Laurice Pope, MD as Referring Physician (Dermatology)  I have updated your Care Teams  any recent Medical Services you may have received from other providers in the past year.     Assessment:   This is a routine wellness examination for [name].  Hearing/Vision screen Hearing Screening - Comments:: Patient wears heading aids  Vision Screening - Comments:: Wears glasses   Goals Addressed             This Visit's Progress    Patient Stated   On track    Continue to work on walking       Depression Screen     04/24/2024    2:42 PM 10/26/2023   10:04 AM 04/25/2023   12:00 PM 04/23/2023    3:35 PM 04/19/2022    9:32 AM 04/19/2022    9:00 AM 11/25/2020   10:42 AM  PHQ 2/9 Scores  PHQ - 2 Score 2 0 0 0 0 0 0  PHQ- 9 Score 2  1        Fall Risk     04/22/2024    9:06 AM 04/03/2024   10:52 AM 03/05/2024    9:49 AM 02/28/2024    3:43 PM 10/26/2023   10:04 AM  Fall Risk   Falls in the past year? 1 0 0 0 0  Number falls in past yr: 0 0 0 0 0  Injury with Fall? 0 0 0 0 0  Risk for fall due to : History of fall(s)   No Fall Risks No Fall Risks  Follow up Falls evaluation completed;Falls prevention discussed Falls evaluation completed Falls evaluation completed Falls evaluation completed Falls evaluation completed    MEDICARE RISK AT HOME:  Medicare Risk at Home Any stairs in or around the home?: (Patient-Rptd) Yes If so, are there any without handrails?: (Patient-Rptd) Yes Home free of loose throw rugs in walkways, pet beds, electrical cords, etc?: (Patient-Rptd) Yes Adequate lighting in your home to reduce risk of falls?: (Patient-Rptd) Yes Life alert?: (Patient-Rptd) No Use of a cane, walker or w/c?: (Patient-Rptd) Yes Grab bars in the bathroom?: (Patient-Rptd) No Shower chair or bench in shower?: (Patient-Rptd) Yes Elevated toilet seat or a handicapped toilet?: No  TIMED UP AND  GO:  Was the test performed?  No  Cognitive Function: 6CIT completed        04/24/2024    2:40 PM 04/23/2023    3:38 PM  6CIT Screen  What Year? 0 points 0 points  What month? 0 points 0 points  What time? 0 points 0 points  Count back from 20 0 points 0 points  Months in reverse 0 points 0 points  Repeat phrase 0 points 0 points  Total Score 0 points 0 points    Immunizations Immunization History  Administered Date(s) Administered   Fluad Quad(high Dose 65+) 08/07/2020, 09/10/2021, 08/16/2022   Influenza-Unspecified 08/29/2023   Moderna Covid-19 Vaccine Bivalent Booster 54yrs & up 09/25/2021   Moderna SARS-COV2 Booster Vaccination 08/25/2022   Moderna Sars-Covid-2 Vaccination 12/03/2019, 01/06/2020, 09/04/2020   Pfizer(Comirnaty)Fall Seasonal Vaccine 12 years and older 07/23/2023   Pneumococcal Conjugate-13 08/28/2017   Pneumococcal Polysaccharide-23 11/09/2018   Td 10/22/2003   Tdap 06/27/2019   Zoster Recombinant(Shingrix) 09/14/2018, 04/30/2019    Screening Tests Health Maintenance  Topic Date Due   COVID-19 Vaccine (8 - 2024-25 season) 01/20/2024   INFLUENZA VACCINE  06/13/2024   Medicare Annual Wellness (AWV)  04/24/2025   DTaP/Tdap/Td (3 - Td or Tdap) 06/26/2029   Pneumococcal Vaccine: 50+ Years  Completed   Hepatitis C Screening  Completed  Zoster Vaccines- Shingrix  Completed   HPV VACCINES  Aged Out   Meningococcal B Vaccine  Aged Out   Colonoscopy  Discontinued    Health Maintenance  Health Maintenance Due  Topic Date Due   COVID-19 Vaccine (8 - 2024-25 season) 01/20/2024   Health Maintenance Items Addressed: Patient declined vaccination  Additional Screening:  Vision Screening: Recommended annual ophthalmology exams for early detection of glaucoma and other disorders of the eye. Would you like a referral to an eye doctor? No    Dental Screening: Recommended annual dental exams for proper oral hygiene  Community Resource Referral / Chronic  Care Management: CRR required this visit?  No   CCM required this visit?  No   Plan:    I have personally reviewed and noted the following in the patient's chart:   Medical and social history Use of alcohol, tobacco or illicit drugs  Current medications and supplements including opioid prescriptions. Patient is not currently taking opioid prescriptions. Functional ability and status Nutritional status Physical activity Advanced directives List of other physicians Hospitalizations, surgeries, and ER visits in previous 12 months Vitals Screenings to include cognitive, depression, and falls Referrals and appointments  In addition, I have reviewed and discussed with patient certain preventive protocols, quality metrics, and best practice recommendations. A written personalized care plan for preventive services as well as general preventive health recommendations were provided to patient.   Freeda Jerry, [place]   04/24/2024   After Visit Summary: (MyChart) Due to this being a telephonic visit, the after visit summary with patients personalized plan was offered to patient via MyChart   Notes: Nothing significant to report at this time.

## 2024-04-24 NOTE — Patient Instructions (Signed)
 [name] , Thank you for taking time out of your busy schedule to complete your Annual Wellness Visit with me. I enjoyed our conversation and look forward to speaking with you again next year. I, as well as your care team,  appreciate your ongoing commitment to your health goals. Please review the following plan we discussed and let me know if I can assist you in the future. Your Game plan/ To Do List    Referrals: If you haven't heard from the office you've been referred to, please reach out to them at the phone provided.  none Follow up Visits: Next Medicare AWV with our clinical staff: 04/28/2025   Have you seen your provider in the last 6 months (3 months if uncontrolled diabetes)? No Next Office Visit with your provider: 05/20/2024  Clinician Recommendations:  Aim for 30 minutes of exercise or brisk walking, 6-8 glasses of water, and 5 servings of fruits and vegetables each day.       This is a list of the screening recommended for you and due dates:  Health Maintenance  Topic Date Due   COVID-19 Vaccine (8 - 2024-25 season) 01/20/2024   Flu Shot  06/13/2024   Medicare Annual Wellness Visit  04/24/2025   DTaP/Tdap/Td vaccine (3 - Td or Tdap) 06/26/2029   Pneumococcal Vaccine for age over 22  Completed   Hepatitis C Screening  Completed   Zoster (Shingles) Vaccine  Completed   HPV Vaccine  Aged Out   Meningitis B Vaccine  Aged Out   Colon Cancer Screening  Discontinued    Advanced directives: (Copy Requested) Please bring a copy of your health care power of attorney and living will to the office to be added to your chart at your convenience. You can mail to [facility] [address] [place], [place] [address] or email to [email or web address] Advance Care Planning is important because it:  [x]  Makes sure you receive the medical care that is consistent with your values, goals, and preferences  [x]  It provides guidance to your family and loved ones and  reduces their decisional burden about whether or not they are making the right decisions based on your wishes.  Follow the link provided in your after visit summary or read over the paperwork we have mailed to you to help you started getting your Advance Directives in place. If you need assistance in completing these, please reach out to us  so that we can help you!  See attachments for Preventive Care and Fall Prevention Tips.

## 2024-04-25 ENCOUNTER — Ambulatory Visit (INDEPENDENT_AMBULATORY_CARE_PROVIDER_SITE_OTHER): Payer: Medicare HMO | Admitting: Family Medicine

## 2024-04-25 ENCOUNTER — Other Ambulatory Visit (INDEPENDENT_AMBULATORY_CARE_PROVIDER_SITE_OTHER)

## 2024-04-25 ENCOUNTER — Encounter: Payer: Self-pay | Admitting: Family Medicine

## 2024-04-25 VITALS — BP 90/60 | HR 72 | Temp 98.3°F | Ht 69.0 in | Wt 243.2 lb

## 2024-04-25 DIAGNOSIS — G20C Parkinsonism, unspecified: Secondary | ICD-10-CM | POA: Diagnosis not present

## 2024-04-25 DIAGNOSIS — E785 Hyperlipidemia, unspecified: Secondary | ICD-10-CM

## 2024-04-25 DIAGNOSIS — I700 Atherosclerosis of aorta: Secondary | ICD-10-CM | POA: Diagnosis not present

## 2024-04-25 DIAGNOSIS — G4733 Obstructive sleep apnea (adult) (pediatric): Secondary | ICD-10-CM | POA: Diagnosis not present

## 2024-04-25 DIAGNOSIS — K635 Polyp of colon: Secondary | ICD-10-CM | POA: Diagnosis not present

## 2024-04-25 DIAGNOSIS — I471 Supraventricular tachycardia, unspecified: Secondary | ICD-10-CM | POA: Diagnosis not present

## 2024-04-25 DIAGNOSIS — Z0000 Encounter for general adult medical examination without abnormal findings: Secondary | ICD-10-CM | POA: Diagnosis not present

## 2024-04-25 NOTE — Assessment & Plan Note (Addendum)
 Chronic,  now off parkinson's meds given Dx in question.  Followed by Dr. Winferd Hatter Neurology

## 2024-04-25 NOTE — Assessment & Plan Note (Signed)
 Stable, chronic.  Continue current medication.  LDL goal less than 70 given aortic atherosclerosis On Crestor  10 mg daily,   Has stopped fenofibrate  with minimal cahnge in chol panel.

## 2024-04-25 NOTE — Assessment & Plan Note (Signed)
Saw cardiology Dr. Radford Pax in early 2023 for irregular heart rate.( PACs seen on CPAP monitor). wore monitor... ZIO showed NSR with episodes of SVT.Marland Kitchen started on toprol XL

## 2024-04-25 NOTE — Assessment & Plan Note (Signed)
LDL goal less than 70 given aortic atherosclerosis On Crestor 10 mg daily, fenofibrate 160 mg p.o. daily 

## 2024-04-25 NOTE — Progress Notes (Signed)
 Patient ID: [name], male    DOB: 26-Jul-1946, 78 y.o.   MRN: [id number]  This visit was conducted in person.  BP 90/60   Pulse 72   Temp 98.3 F (36.8 C) (Oral)   Ht 5' 9 (1.753 m)   Wt 243 lb 4 oz (110.3 kg)   SpO2 95%   BMI 35.92 kg/m    CC:  Chief Complaint  Patient presents with   Annual Exam    Part 2    Subjective:   HPI: [name] is a 78 y.o. male presenting on 04/25/2024 for Annual Exam (Part 2)  The patient presents for  complete physical and review of chronic health problems. He/She also has the following acute concerns today:  The patient saw a LPN or RN for medicare wellness visit.  04/24/2024  Prevention and wellness was reviewed in detail. Note reviewed and important notes copied below.  ? Parkinson's disease: Diagnosed in 2019.. now in question.. per Dr. Winferd Hatter new neurologist pt does not meet criteria Followed by neurology Dr. Winferd Hatter.. reviewed last OV note from Neuro 03/05/2024, 04/03/2024  No longer on Sinemet  25/100 mg p.o. 3 times daily.  Doing physical therapy.... MRI brain scheduled.  Elevated Cholesterol: LDL goal less than 70 given aortic atherosclerosis On Crestor  10 mg daily, fenofibrate  160 mg p.o. daily Saw cardiology Dr. Micael Adas in early 2023 for irregular heart rate.( PACs seen on CPAP monitor). wore monitor... ZIO showed NSR with episodes of SVT.Aaron Aas started on toprol  XL Lab Results  Component Value Date   CHOL 94 10/18/2023   HDL 40.30 10/18/2023   LDLCALC 40 10/18/2023   TRIG 72.0 10/18/2023   CHOLHDL 2 10/18/2023  Using medications without problems: no side effects Muscle aches:  Diet compliance: moderate Exercise ride  stationary bike and stretches/PT balance exercises Other complaints:   Obstructive sleep apnea: Using CPAP, well controlled.    Peripheral edema in right lower leg given history of recurrent cellulitis.  He takes low-dose furosemide  10 mg daily to help with swelling.  Wears compression hose  BP Readings from  Last 3 Encounters:  04/25/24 90/60  04/24/24 116/60  04/03/24 116/60     Relevant past medical, surgical, family and social history reviewed and updated as indicated. Interim medical history since our last visit reviewed. Allergies and medications reviewed and updated. Outpatient Medications Prior to Visit  Medication Sig Dispense Refill   ASPIRIN 81 PO Take by mouth daily.     cetirizine  (ZYRTEC  ALLERGY) 10 MG tablet Take 1 tablet (10 mg total) by mouth daily. 90 tablet 3   Coenzyme Q10 (COQ10 PO) Take by mouth. 1 per day     furosemide  (LASIX ) 20 MG tablet Take 0.5 tablets (10 mg total) by mouth daily. 45 tablet 3   glucosamine-chondroitin 500-400 MG tablet Take 1 tablet by mouth daily.     ketoconazole  (NIZORAL ) 2 % cream Apply 1 Application topically daily. 60 g 0   ketoconazole  (NIZORAL ) 2 % shampoo Apply 1 Application topically 2 (two) times a week.     metoprolol  succinate (TOPROL -XL) 25 MG 24 hr tablet Take 1 tablet by mouth once daily 90 tablet 3   metroNIDAZOLE (METROCREAM) 0.75 % cream Apply 1 Application topically daily.     Multiple Vitamins-Minerals (MULTIVITAMIN WITH MINERALS) tablet Take 1 tablet by mouth daily.     mupirocin ointment (BACTROBAN) 2 % Apply topically as needed.     nystatin  (MYCOSTATIN /NYSTOP ) powder Apply 1 Application topically 3 (three) times  daily. 15 g 0   rosuvastatin  (CRESTOR ) 10 MG tablet Take 1 tablet by mouth once daily 90 tablet 3   No facility-administered medications prior to visit.     Per HPI unless specifically indicated in ROS section below Review of Systems  Constitutional:  Negative for fatigue and fever.  HENT:  Negative for ear pain.   Eyes:  Negative for pain.  Respiratory:  Negative for cough and shortness of breath.   Cardiovascular:  Negative for chest pain, palpitations and leg swelling.  Gastrointestinal:  Negative for abdominal pain.  Genitourinary:  Negative for dysuria.  Musculoskeletal:  Negative for arthralgias.   Neurological:  Negative for syncope, light-headedness and headaches.  Psychiatric/Behavioral:  Negative for dysphoric mood.    Objective:  BP 90/60   Pulse 72   Temp 98.3 F (36.8 C) (Oral)   Ht 5' 9 (1.753 m)   Wt 243 lb 4 oz (110.3 kg)   SpO2 95%   BMI 35.92 kg/m   Wt Readings from Last 3 Encounters:  04/25/24 243 lb 4 oz (110.3 kg)  04/24/24 237 lb (107.5 kg)  04/03/24 237 lb (107.5 kg)      Physical Exam Constitutional:      Appearance: He is well-developed.     Comments: Using rolling walker  HENT:     Head: Normocephalic.     Right Ear: Hearing normal.     Left Ear: Hearing normal.     Nose: Nose normal.  Neck:     Thyroid: No thyroid mass or thyromegaly.     Vascular: No carotid bruit.     Trachea: Trachea normal.   Cardiovascular:     Rate and Rhythm: Normal rate and regular rhythm.     Pulses: Normal pulses.     Heart sounds: Heart sounds not distant. No murmur heard.    No friction rub. No gallop.     Comments: No peripheral edema Pulmonary:     Effort: Pulmonary effort is normal. No respiratory distress.     Breath sounds: Normal breath sounds.   Skin:    General: Skin is warm and dry.     Findings: No rash.   Neurological:     Cranial Nerves: Cranial nerves 2-12 are intact.     Sensory: Sensation is intact.     Motor: Motor function is intact.     Coordination: Coordination abnormal.     Gait: Gait abnormal.   Psychiatric:        Speech: Speech normal.        Behavior: Behavior normal.        Thought Content: Thought content normal.       Results for orders placed or performed in visit on 10/26/23  POCT glycosylated hemoglobin (Hb A1C)   Collection Time: 10/26/23 10:41 AM  Result Value Ref Range   Hemoglobin A1C 5.3 4.0 - 5.6 %   HbA1c POC (<> result, manual entry)     HbA1c, POC (prediabetic range)     HbA1c, POC (controlled diabetic range)       COVID 19 screen:  No recent travel or known exposure to COVID19 The patient denies  respiratory symptoms of COVID 19 at this time. The importance of social distancing was discussed today.   Assessment and Plan The patient's preventative maintenance and recommended screening tests for an annual wellness exam were reviewed in full today. Brought up to date unless services declined.  Counselled on the importance of diet, exercise, and its role in  overall health and mortality. The patient's FH and SH was reviewed, including their home life, tobacco status, and drug and alcohol status.    Vaccines: COVID x 5, influenza vaccine in fall, Tdap August 2020, completed pneumonia and Shingrix series Prostate Cancer Screen:   father with prostate cancer, he request continue PSA yearly Lab Results  Component Value Date   PSA 0.23 10/18/2023   PSA 0.22 04/18/2023   PSA 0.23 04/19/2022  Colon Cancer Screen: November 04, 2018,  Was normal..      Smoking Status: Former smoker pipe or cigar smoker 2 times a year, occ ETOH/ drug use: 1 beers a day three times a week/none  Hep C: Negative     Problem List Items Addressed This Visit     Aortic atherosclerosis (HCC) (Chronic)   LDL goal less than 70 given aortic atherosclerosis On Crestor  10 mg daily, fenofibrate  160 mg p.o. daily      Hyperlipidemia LDL goal <70 (Chronic)   Stable, chronic.  Continue current medication.  LDL goal less than 70 given aortic atherosclerosis On Crestor  10 mg daily,   Has stopped fenofibrate  with minimal cahnge in chol panel.      Relevant Orders   Lipid panel   Comprehensive metabolic panel with GFR   OSA (obstructive sleep apnea) (Chronic)   Using CPAP, well controlled.      Parkinsonism (HCC)   Chronic,  now off parkinson's meds given Dx in question.  Followed by Dr. Winferd Hatter Neurology      SVT (supraventricular tachycardia) [facility])   Saw cardiology Dr. Micael Adas in early 2023 for irregular heart rate.( PACs seen on CPAP monitor). wore monitor... ZIO showed NSR with episodes of SVT.Aaron Aas started  on toprol  XL      Other Visit Diagnoses       Routine general medical examination at a health care facility    -  Primary     Polyp of colon, unspecified part of colon, unspecified type       Relevant Orders   Ambulatory referral to Gastroenterology        Herby Lolling, MD

## 2024-04-25 NOTE — Addendum Note (Signed)
 Addended by: Bernadene Brewer on: 04/25/2024 11:44 AM   Modules accepted: Orders

## 2024-04-25 NOTE — Assessment & Plan Note (Signed)
Using CPAP, well controlled.

## 2024-04-26 LAB — COMPREHENSIVE METABOLIC PANEL WITH GFR
AG Ratio: 1.7 (calc) (ref 1.0–2.5)
ALT: 26 U/L (ref 9–46)
AST: 30 U/L (ref 10–35)
Albumin: 4.3 g/dL (ref 3.6–5.1)
Alkaline phosphatase (APISO): 31 U/L — ABNORMAL LOW (ref 35–144)
BUN/Creatinine Ratio: 19 (calc) (ref 6–22)
BUN: 13 mg/dL (ref 7–25)
CO2: 25 mmol/L (ref 20–32)
Calcium: 9.2 mg/dL (ref 8.6–10.3)
Chloride: 105 mmol/L (ref 98–110)
Creat: 0.67 mg/dL — ABNORMAL LOW (ref 0.70–1.28)
Globulin: 2.6 g/dL (ref 1.9–3.7)
Glucose, Bld: 85 mg/dL (ref 65–99)
Potassium: 4.1 mmol/L (ref 3.5–5.3)
Sodium: 140 mmol/L (ref 135–146)
Total Bilirubin: 0.9 mg/dL (ref 0.2–1.2)
Total Protein: 6.9 g/dL (ref 6.1–8.1)
eGFR: 97 mL/min/{1.73_m2} (ref >=60)

## 2024-04-26 LAB — LIPID PANEL
Cholesterol: 95 mg/dL (ref <200)
HDL: 39 mg/dL — ABNORMAL LOW (ref >=40)
LDL Cholesterol (Calc): 36 mg/dL
Non-HDL Cholesterol (Calc): 56 mg/dL (ref <130)
Total CHOL/HDL Ratio: 2.4 (calc) (ref <5.0)
Triglycerides: 115 mg/dL (ref <150)

## 2024-04-28 ENCOUNTER — Encounter: Payer: Self-pay | Admitting: Neurology

## 2024-04-30 ENCOUNTER — Ambulatory Visit
Admission: RE | Admit: 2024-04-30 | Discharge: 2024-04-30 | Disposition: A | Discharge status: Home / Self Care | Source: Ambulatory Visit | Attending: Neurology | Admitting: Neurology

## 2024-04-30 DIAGNOSIS — G214 Vascular parkinsonism: Secondary | ICD-10-CM

## 2024-04-30 DIAGNOSIS — M545 Low back pain, unspecified: Secondary | ICD-10-CM

## 2024-04-30 DIAGNOSIS — M419 Scoliosis, unspecified: Secondary | ICD-10-CM

## 2024-04-30 DIAGNOSIS — M48061 Spinal stenosis, lumbar region without neurogenic claudication: Secondary | ICD-10-CM | POA: Diagnosis not present

## 2024-05-01 ENCOUNTER — Ambulatory Visit: Payer: Self-pay | Admitting: Family Medicine

## 2024-05-01 ENCOUNTER — Ambulatory Visit: Payer: Self-pay | Admitting: Neurology

## 2024-05-01 NOTE — Progress Notes (Signed)
 No critical labs need to be addressed urgently. We will discuss labs in detail at upcoming office visit.

## 2024-05-20 ENCOUNTER — Ambulatory Visit

## 2024-05-21 ENCOUNTER — Telehealth: Payer: Self-pay | Admitting: Neurology

## 2024-05-21 ENCOUNTER — Telehealth: Payer: Self-pay

## 2024-05-21 ENCOUNTER — Other Ambulatory Visit: Payer: Self-pay

## 2024-05-21 DIAGNOSIS — M48061 Spinal stenosis, lumbar region without neurogenic claudication: Secondary | ICD-10-CM

## 2024-05-21 NOTE — Telephone Encounter (Signed)
 Called patient and delivered results. Patient and his wife agreed to referral to neurosurgery. Patient and his wife wanted to know what this means about the possibility of parkinson's and the next step. Do you still want to see them in Nov

## 2024-05-21 NOTE — Telephone Encounter (Signed)
 Pt. Would like call back about results

## 2024-05-22 NOTE — Telephone Encounter (Signed)
 Called pateint and wife and gave results

## 2024-05-23 NOTE — Telephone Encounter (Signed)
 Called and spoke to patients wife and gave recommendations

## 2024-05-25 ENCOUNTER — Other Ambulatory Visit: Payer: Self-pay | Admitting: Cardiology

## 2024-05-25 DIAGNOSIS — I700 Atherosclerosis of aorta: Secondary | ICD-10-CM

## 2024-05-28 NOTE — Progress Notes (Signed)
 Referring Physician:  Avelina Greig BRAVO, MD [address] [place],  [place] [address]  Primary Physician:  Avelina Greig BRAVO, MD  History of Present Illness: 06/02/2024 [name] is here today with a chief complaint of gait instability and balance issues.  He has been following closely with neurology.  He was previously diagnosed with Parkinson disease and now they have stated that they do not think this is the case.  Patient and his wife state that his gait instability and balance issues started approximately 6 years ago they have not been able to get to the root of the issue yet.  He states that he does have some minimal lower back pain with off-and-on right leg weakness, but this is not consistent and not his biggest concern.  Denies cervical pain or radiating pain to his upper extremities.  Duration: 2 months of back mild pain-gait issue since 2019 Severity: 3/10  Precipitating: aggravated by not so much pain Modifying factors: made better by Aleve Weakness: none Timing: off and on Bowel/Bladder Dysfunction: none  Conservative measures:  Physical therapy: has participated in PT for gait/balance Multimodal medical therapy including regular antiinflammatories: Aleve  Injections: no epidural steroid injections  Past Surgery: no spine surgery   The symptoms are causing a significant impact on the patient's life.   Review of Systems:  A 10 point review of systems is negative, except for the pertinent positives and negatives detailed in the HPI.  Past Medical History: Past Medical History:  Diagnosis Date   Agatston coronary artery calcium  score greater than 400    Coronary calcium  score of 533. This was 69th percentile for age,   Allergy 15 yrs. or more   mosquito or wasp bites bad reaction   Aortic atherosclerosis (HCC)    Cataract 10 yrs. or more   slow bloom checkup every 6 mos.   Hyperlipidemia LDL goal <70    Neuromuscular disorder (HCC) 09/2018   Parkinsons    Parkinson disease (HCC)    Sleep apnea 02/21/2021 sleep study   have Resair machine    Past Surgical History: Past Surgical History:  Procedure Laterality Date   TONSILLECTOMY      Allergies: Allergies as of 06/02/2024 - Review Complete 06/02/2024  Allergen Reaction Noted   Bee venom  03/05/2024    Medications: Outpatient Encounter Medications as of 06/02/2024  Medication Sig   ASPIRIN 81 PO Take by mouth daily.   cetirizine  (ZYRTEC  ALLERGY) 10 MG tablet Take 1 tablet (10 mg total) by mouth daily.   Coenzyme Q10 (COQ10 PO) Take by mouth. 1 per day   furosemide  (LASIX ) 20 MG tablet Take 0.5 tablets (10 mg total) by mouth daily.   glucosamine-chondroitin 500-400 MG tablet Take 1 tablet by mouth daily.   ketoconazole  (NIZORAL ) 2 % cream Apply 1 Application topically daily.   ketoconazole  (NIZORAL ) 2 % shampoo Apply 1 Application topically 2 (two) times a week.   metoprolol  succinate (TOPROL -XL) 25 MG 24 hr tablet Take 1 tablet (25 mg total) by mouth daily. Patient must schedule appointment for further refills first attempt   metroNIDAZOLE (METROCREAM) 0.75 % cream Apply 1 Application topically daily.   Multiple Vitamins-Minerals (MULTIVITAMIN WITH MINERALS) tablet Take 1 tablet by mouth daily.   mupirocin ointment (BACTROBAN) 2 % Apply topically as needed.   nystatin  (MYCOSTATIN /NYSTOP ) powder Apply 1 Application topically 3 (three) times daily.   rosuvastatin  (CRESTOR ) 10 MG tablet Take 1 tablet by mouth once daily   No facility-administered  encounter medications on file as of 06/02/2024.    Social History: Social History   Tobacco Use   Smoking status: Some Days    Types: Cigars   Smokeless tobacco: Never   Tobacco comments:    so little now not worth mentioning - cigar =special occasions, pipe also couple of times since move  Vaping Use   Vaping status: Never Used  Substance Use Topics   Alcohol use: Yes    Alcohol/week: 10.0 standard drinks of alcohol    Types: 2  Glasses of wine, 6 Cans of beer, 2 Standard drinks or equivalent per week    Comment: 2 beers few days/week   Drug use: Never    Family Medical History: Family History  Problem Relation Age of Onset   Arthritis Mother    Hearing loss Mother    Hypertension Mother    Parkinson's disease Mother    Prostate cancer Father    Hearing loss Father    Heart attack Father 48   Hyperlipidemia Father    Hypertension Sister    Heart disease Maternal Grandmother    Hypertension Maternal Grandmother    Arthritis Maternal Grandmother    Diabetes Daughter    Sleep apnea Neg Hx     Physical Examination: @VITALWITHPAIN @  General: Patient is well developed, well nourished, calm, collected, and in no apparent distress. Attention to examination is appropriate.  Psychiatric: Patient is non-anxious.  Head:  Pupils equal, round, and reactive to light.  ENT:  Oral mucosa appears well hydrated.  Neck:   Supple.    Respiratory: Patient is breathing without any difficulty.  Extremities: No edema.  Vascular: Palpable dorsal pedal pulses.  Skin:   On exposed skin, there are no abnormal skin lesions.  NEUROLOGICAL:     Awake, alert, oriented to person, place, and time.  Speech is clear and fluent. Fund of knowledge is appropriate.   Cranial Nerves: Pupils equal round and reactive to light.  Facial tone is symmetric.    Patient has forward carriage in cervical spine  Strength: Side Biceps Triceps Deltoid Interossei Grip Wrist Ext. Wrist Flex.  R 5 5 5 5 5 5 5   L 5 5 5 5 5 5 5    Side Iliopsoas Quads Hamstring PF DF EHL  R 5 5 5 5  4+ 5  L 5 5 5 5 5 5    Some difficulty with inversion and eversion of her ankle. Reflexes are 2+ and symmetric at the biceps, triceps, brachioradialis, patella and achilles.   Hoffman's is absent.  Clonus is not present.  Toes are down-going.  Bilateral upper and lower extremity sensation is intact to light touch.    Patient ambulates with a  rollator.  Medical Decision Making  Imaging: EXAM: MRI THORACIC AND LUMBAR SPINE WITHOUT CONTRAST   TECHNIQUE: Multiplanar and multiecho pulse sequences of the thoracic and lumbar spine were obtained without intravenous contrast.   COMPARISON:  None Available.   FINDINGS: MRI THORACIC SPINE FINDINGS   Alignment:  Normal.   Vertebrae: Normal vertebral body heights and marrow signal.   Cord:  Normal spinal cord signal and volume.   Paraspinal and other soft tissues: Unremarkable.   Disc levels:   Small disc bulge at T4-5. Small left central disc protrusion at T7-8. No spinal canal stenosis or significant neural foraminal narrowing.   MRI LUMBAR SPINE FINDINGS   Segmentation:  Standard.   Alignment: 3 mm retrolisthesis of L1 on L2. Grade 1 anterolisthesis of L4 on L5. Levoscoliotic  curvature of the lumbar spine.   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 Normal vertebral body heights. Modic type 1 degenerative endplate marrow signal changes at L2-3.   Conus medullaris and cauda equina: Conus extends to the L1-2 level. Conus and cauda equina appear normal.   Paraspinal and other soft tissues: Mild fatty atrophy of the paraspinal muscles.   Disc levels:   T12-L1:  Normal.   L1-L2: Small disc bulge and mild bilateral facet arthropathy. No spinal canal stenosis or neural foraminal narrowing.   L2-L3: Congenitally short pedicles, disc bulge and mild bilateral facet arthropathy contribute to mild spinal canal stenosis. No significant neural foraminal narrowing.   L3-L4: Congenitally short pedicles, disc bulge and facet arthropathy contribute to moderate spinal canal stenosis. No significant neural foraminal narrowing.   L4-L5: Anterolisthesis with uncovered disc, congenitally short pedicles and moderate bilateral facet arthropathy with joint effusions and periarticular edema contribute to moderate-to-severe spinal canal stenosis. No  significant neural foraminal narrowing.   L5-S1: No disc herniation, spinal canal stenosis or neural foraminal narrowing. Mild bilateral facet arthropathy.   IMPRESSION: 1. 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 2. 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 3. Mild spinal canal stenosis at L2-3. 4. No spinal canal stenosis or neural foraminal narrowing in the thoracic spine.  I have personally reviewed the images and agree with the above interpretation.  Assessment and Plan: [name] is a pleasant 78 y.o. male is here today with a chief complaint of gait instability and balance issues.  He has been following closely with neurology.  He was previously diagnosed with Parkinson disease and now they have stated that they do not think this is the case.  Patient and his wife state that his gait instability and balance issues started approximately 6 years ago they have not been able to get to the root of the issue yet.  He states that he does have some minimal lower back pain with off-and-on right leg weakness, but this is not consistent and not his biggest concern.  No neck pain or neck pain radiating down bilateral upper extremities.  On examination patient does have some mild right foot weakness.  He does have significant forward carriage and scoliosis of his spine.  MRI reviewed which does show some degenerative changes throughout.  There is a small disc herniation in his thoracic spine as well as some spinal stenosis around L4-5.  Concerned patient potentially has cervical myelopathy that has been contributing to his gait instability.  Would like him to undergo MRI of his cervical spine.  He has been in physical therapy consistently for many years and this has not improved.  Today would like him to undergo scoliosis films as well as flexion-extension x-rays of both his  cervical and lumbar spine.  Will review results once complete regarding the MRI.  Thank you for involving me in the care of this patient.   I spent a total of 45 minutes in both face-to-face and non-face-to-face activities for this visit on the date of this encounter including repairing to the patient, obtaining and reviewing separately obtained history, performing medically appropriate examination, counseling the patient and his family, ordering additional test, documenting clinical information, independently interpreting results, care coronation.  Lyle Decamp, PA-C Dept. of Neurosurgery

## 2024-06-02 ENCOUNTER — Ambulatory Visit: Admitting: Physician Assistant

## 2024-06-02 ENCOUNTER — Encounter: Payer: Self-pay | Admitting: Physician Assistant

## 2024-06-02 ENCOUNTER — Other Ambulatory Visit: Payer: Self-pay | Admitting: Physician Assistant

## 2024-06-02 ENCOUNTER — Ambulatory Visit
Admission: RE | Admit: 2024-06-02 | Discharge: 2024-06-02 | Disposition: A | Discharge status: Home / Self Care | Source: Ambulatory Visit | Attending: Physician Assistant | Admitting: Physician Assistant

## 2024-06-02 ENCOUNTER — Ambulatory Visit
Admission: RE | Admit: 2024-06-02 | Discharge: 2024-06-02 | Disposition: A | Discharge status: Home / Self Care | Attending: Physician Assistant | Admitting: Physician Assistant

## 2024-06-02 VITALS — BP 124/66 | Ht 73.0 in | Wt 240.0 lb

## 2024-06-02 DIAGNOSIS — R2681 Unsteadiness on feet: Secondary | ICD-10-CM

## 2024-06-02 DIAGNOSIS — M5136 Other intervertebral disc degeneration, lumbar region with discogenic back pain only: Secondary | ICD-10-CM | POA: Diagnosis not present

## 2024-06-02 DIAGNOSIS — M48061 Spinal stenosis, lumbar region without neurogenic claudication: Secondary | ICD-10-CM

## 2024-06-02 DIAGNOSIS — M5124 Other intervertebral disc displacement, thoracic region: Secondary | ICD-10-CM

## 2024-06-02 DIAGNOSIS — R2689 Other abnormalities of gait and mobility: Secondary | ICD-10-CM | POA: Diagnosis not present

## 2024-06-02 DIAGNOSIS — M4712 Other spondylosis with myelopathy, cervical region: Secondary | ICD-10-CM

## 2024-06-02 DIAGNOSIS — M419 Scoliosis, unspecified: Secondary | ICD-10-CM | POA: Diagnosis not present

## 2024-06-02 DIAGNOSIS — M8580 Other specified disorders of bone density and structure, unspecified site: Secondary | ICD-10-CM | POA: Diagnosis not present

## 2024-06-02 DIAGNOSIS — M5126 Other intervertebral disc displacement, lumbar region: Secondary | ICD-10-CM | POA: Diagnosis not present

## 2024-06-02 DIAGNOSIS — Z981 Arthrodesis status: Secondary | ICD-10-CM | POA: Diagnosis not present

## 2024-06-05 ENCOUNTER — Inpatient Hospital Stay
Admission: RE | Admit: 2024-06-05 | Discharge: 2024-06-05 | Discharge status: Home / Self Care | Source: Ambulatory Visit | Attending: Physician Assistant | Admitting: Physician Assistant

## 2024-06-05 DIAGNOSIS — M419 Scoliosis, unspecified: Secondary | ICD-10-CM

## 2024-06-05 DIAGNOSIS — M542 Cervicalgia: Secondary | ICD-10-CM | POA: Diagnosis not present

## 2024-06-05 DIAGNOSIS — R2681 Unsteadiness on feet: Secondary | ICD-10-CM

## 2024-06-05 DIAGNOSIS — M4712 Other spondylosis with myelopathy, cervical region: Secondary | ICD-10-CM | POA: Diagnosis not present

## 2024-06-09 ENCOUNTER — Other Ambulatory Visit

## 2024-06-09 ENCOUNTER — Ambulatory Visit: Payer: Self-pay | Admitting: Physician Assistant

## 2024-06-19 NOTE — Progress Notes (Signed)
 Referring Physician:  Avelina Greig BRAVO, MD [address] [place],  [place] [address]  Primary Physician:  Avelina Greig BRAVO, MD  History of Present Illness: 06/23/2024 With a complicated past medical history.  He has had a history of right-sided cellulitis in his right lower extremity in 2018.  Ever since that time he is had difficulty with ambulation.  This is led to a workup with concern for possible parkinsonian is him.  He was treated with medication, and eventually was stopped on his Parkinson's medication for lack of response and to medications, and lack of fulfilling full diagnostic criteria.  His major complaint is difficulty with falling and necessitating utilizing a rollator.  He has minimal to no back pain.  Minimal to no leg pain.  Minimal to no neck pain.  Minimal to no radiating arm pain.  He has been evaluated for his worsening gait instability and balance issues ever since the hospitalization for cellulitis.  He has been worked up for vascular claudication he has not had a workup for neuropathy   Conservative measures:  Physical therapy: has participated in PT for gait/balance Multimodal medical therapy including regular antiinflammatories: Aleve  Injections: no epidural steroid injections   Past Surgery: no spine surgery  The symptoms are causing a significant impact on the patient's life.   I have utilized the care everywhere function in epic to review the outside records available from external health systems.  Review of Systems:  A 10 point review of systems is negative, except for the pertinent positives and negatives detailed in the HPI.  Past Medical History: Past Medical History:  Diagnosis Date   Agatston coronary artery calcium  score greater than 400    Coronary calcium  score of 533. This was 69th percentile for age,   Allergy 15 yrs. or more   mosquito or wasp bites bad reaction   Aortic atherosclerosis (HCC)    Cataract 10 yrs. or more   slow bloom  checkup every 6 mos.   Hyperlipidemia LDL goal <70    Neuromuscular disorder (HCC) 09/2018   Parkinsons   Parkinson disease (HCC)    Sleep apnea 02/21/2021 sleep study   have Resair machine    Past Surgical History: Past Surgical History:  Procedure Laterality Date   TONSILLECTOMY      Allergies: Allergies as of 06/23/2024 - Review Complete 06/02/2024  Allergen Reaction Noted   Bee venom  03/05/2024    Medications:  Current Outpatient Medications:    ASPIRIN 81 PO, Take by mouth daily., Disp: , Rfl:    cetirizine  (ZYRTEC  ALLERGY) 10 MG tablet, Take 1 tablet (10 mg total) by mouth daily., Disp: 90 tablet, Rfl: 3   Coenzyme Q10 (COQ10 PO), Take by mouth. 1 per day, Disp: , Rfl:    furosemide  (LASIX ) 20 MG tablet, Take 0.5 tablets (10 mg total) by mouth daily. (Patient taking differently: Take 10 mg by mouth daily. As needed), Disp: 45 tablet, Rfl: 3   glucosamine-chondroitin 500-400 MG tablet, Take 1 tablet by mouth daily., Disp: , Rfl:    ketoconazole  (NIZORAL ) 2 % cream, Apply 1 Application topically daily., Disp: 60 g, Rfl: 0   ketoconazole  (NIZORAL ) 2 % shampoo, Apply 1 Application topically 2 (two) times a week., Disp: , Rfl:    metoprolol  succinate (TOPROL -XL) 25 MG 24 hr tablet, Take 1 tablet (25 mg total) by mouth daily. Patient must schedule appointment for further refills first attempt, Disp: 30 tablet, Rfl: 0   metroNIDAZOLE (METROCREAM)  0.75 % cream, Apply 1 Application topically daily., Disp: , Rfl:    Multiple Vitamins-Minerals (MULTIVITAMIN WITH MINERALS) tablet, Take 1 tablet by mouth daily., Disp: , Rfl:    mupirocin ointment (BACTROBAN) 2 %, Apply topically as needed., Disp: , Rfl:    nystatin  (MYCOSTATIN /NYSTOP ) powder, Apply 1 Application topically 3 (three) times daily., Disp: 15 g, Rfl: 0   rosuvastatin  (CRESTOR ) 10 MG tablet, Take 1 tablet by mouth once daily, Disp: 30 tablet, Rfl: 0  Social History: Social History   Tobacco Use   Smoking status: Some  Days    Types: Cigars   Smokeless tobacco: Never   Tobacco comments:    so little now not worth mentioning - cigar =special occasions, pipe also couple of times since move  Vaping Use   Vaping status: Never Used  Substance Use Topics   Alcohol use: Yes    Alcohol/week: 10.0 standard drinks of alcohol    Types: 2 Glasses of wine, 6 Cans of beer, 2 Standard drinks or equivalent per week    Comment: 2 beers few days/week   Drug use: Never    Family Medical History: Family History  Problem Relation Age of Onset   Arthritis Mother    Hearing loss Mother    Hypertension Mother    Parkinson's disease Mother    Prostate cancer Father    Hearing loss Father    Heart attack Father 29   Hyperlipidemia Father    Hypertension Sister    Heart disease Maternal Grandmother    Hypertension Maternal Grandmother    Arthritis Maternal Grandmother    Diabetes Daughter    Sleep apnea Neg Hx     Physical Examination: Vitals:   06/23/24 1047  BP: 128/70    General: Patient is in no apparent distress. Attention to examination is appropriate.  Neck:   Supple.  Full range of motion.  Respiratory: Patient is breathing without any difficulty.   NEUROLOGICAL:     Awake, alert, oriented to person, place, and time.  Speech is clear and fluent.   Cranial Nerves: Pupils equal round and reactive to light.  Facial tone is symmetric although somewhat hypoactive.  Facial sensation is symmetric. Shoulder shrug is symmetric. Tongue protrusion is midline.    Strength: He has no major deficits noted in his bilateral lower extremities.  Once activating has good strength.  No major deficits.  No muscle wasting.  Reflexes are 2+ at the bilateral brachial radialis, triceps, and biceps.  1+ at the bilateral knees and medial hamstrings, and trace to absent at the bilateral Achille  When compared to his bilateral proximal upper extremities he does state that he has decreased sensation to light touch and pain  in his distal lower extremities     Imaging: I personally reviewed his cervical, thoracic, and lumbar imaging.  In regards to his cervical and thoracic imaging, there is some spondylosis but no evidence of cord compression.  In his lumbar spine his area of most severe compression is at the L4-5 interval although there is still some evidence of CSF signal at this level.  He has some mild facet changes.  Overall is moderate to severe but not critical.  Have also reviewed his scoliosis films, he does appear to be in his sagittal balance with a significant thoracic kyphosis.  He does not appear to be compensating, he is in positive balance both at the level of the cervical spine from C2-C7, as well as with a cervical plumbline.  He also has a rightward shift on coronal imaging.   I have personally reviewed the images and agree with the above interpretation.  Medical Decision Making/Assessment and Plan: Mr. Parson is a pleasant 78 y.o. male with gait difficulty and instability requiring a rollator.  He has a complicated medical history, was previously diagnosed with possible parkinsonian is him, given medications but with no significant improvement.  He has been reevaluated and found to have lack of full evidence of a diagnosis of Parkinson's disease by Dr. Evonnie.  He has been weaned off of his Parkinson's medication with no changes.  He does not have significant low back pain or neck pain.  He does not have significant radiating symptomatology.  He does not have any new muscle weakness.  States that he has had difficulty with ambulating ever since he had a severe bout of cellulitis since 2018.  He was treated with prolonged antibiotics.  Had severe leg swelling.  He is also had physical therapy but not seeing significant improvements.  He does not have a classic presentation for lumbar neurogenic claudication.  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  Does not have any radiating/radicular pain.  He does have some symptoms consistent with neuropathy, he has slightly decreased sensation right worse than left when compared to the bilateral upper extremities.  He also has decreased reflexes in the distal lower extremities, his Achilles reflexes are absent bilaterally but his patellas are present but diminished when compared to his upper extremities.  Given his previous traumatic/infectious injury to his right lower extremity with severe swelling requiring IV antibiotic long-term.  He is at risk for right lower extremity neuropathy.  He does not have other considerable risk factors for neuropathy including no history of diabetes or hypothyroidism.  Does not have a significant alcohol history per patient report.  However I do feel that we should evaluate him for neuropathy given his examination and presentation with difficulty with ambulating and turning.  I would like to get a bilateral lower extremity EMG/nerve conduction study for this reason.  If this is negative he could have gait instability and difficulty with walking given his positive sagittal and offset coronal balance.  This could put him out of the area of which he would be able to easily recover from tripping or unstable events.  I would like to rule out neuropathy or lower extremity radiculopathy prior to contributing his issues to his spinal deformity.  Thank you for involving me in the care of this patient.    Penne MICAEL Claudene MD/MSCR Neurosurgery   Spent a total of 30 minutes with the patient today, this was in chart review, face-to-face evaluation, physical examination, coordination of his care going forward, and documentation.

## 2024-06-23 ENCOUNTER — Encounter: Payer: Self-pay | Admitting: Neurosurgery

## 2024-06-23 ENCOUNTER — Ambulatory Visit: Admitting: Neurosurgery

## 2024-06-23 VITALS — BP 128/70 | Ht 73.0 in | Wt 240.0 lb

## 2024-06-23 DIAGNOSIS — Z9229 Personal history of other drug therapy: Secondary | ICD-10-CM

## 2024-06-23 DIAGNOSIS — R292 Abnormal reflex: Secondary | ICD-10-CM | POA: Diagnosis not present

## 2024-06-23 DIAGNOSIS — M48061 Spinal stenosis, lumbar region without neurogenic claudication: Secondary | ICD-10-CM

## 2024-06-23 DIAGNOSIS — R208 Other disturbances of skin sensation: Secondary | ICD-10-CM

## 2024-06-23 DIAGNOSIS — R2681 Unsteadiness on feet: Secondary | ICD-10-CM | POA: Insufficient documentation

## 2024-06-23 DIAGNOSIS — Z8619 Personal history of other infectious and parasitic diseases: Secondary | ICD-10-CM | POA: Diagnosis not present

## 2024-06-23 DIAGNOSIS — M419 Scoliosis, unspecified: Secondary | ICD-10-CM | POA: Insufficient documentation

## 2024-06-27 ENCOUNTER — Telehealth: Payer: Self-pay | Admitting: Neurology

## 2024-06-27 NOTE — Telephone Encounter (Signed)
 Would you like Dr. Claudene to order the EMG or do you prefer I put in the orders

## 2024-06-27 NOTE — Telephone Encounter (Signed)
 Called patients wife back. Referral for EMG has been received at our office from Dr. Claudene and they will be calling to schedule soon

## 2024-06-27 NOTE — Telephone Encounter (Signed)
 Pt wife called in this afternoon and she stated that pt saw Dr. Claudene and Dr. Claudene recommended a Nerve Test for pt. The wife would like a call back to see what can be done  about getting a Nerve Test done.

## 2024-06-30 ENCOUNTER — Other Ambulatory Visit: Payer: Self-pay

## 2024-06-30 ENCOUNTER — Encounter: Payer: Self-pay | Admitting: Neurology

## 2024-06-30 DIAGNOSIS — R202 Paresthesia of skin: Secondary | ICD-10-CM

## 2024-07-04 ENCOUNTER — Ambulatory Visit: Admitting: Gastroenterology

## 2024-07-04 ENCOUNTER — Encounter: Payer: Self-pay | Admitting: Gastroenterology

## 2024-07-04 VITALS — BP 104/68 | HR 71 | Ht 73.0 in | Wt 243.2 lb

## 2024-07-04 DIAGNOSIS — K625 Hemorrhage of anus and rectum: Secondary | ICD-10-CM | POA: Diagnosis not present

## 2024-07-04 DIAGNOSIS — K648 Other hemorrhoids: Secondary | ICD-10-CM

## 2024-07-04 DIAGNOSIS — Z860100 Personal history of colon polyps, unspecified: Secondary | ICD-10-CM

## 2024-07-04 MED ORDER — NA SULFATE-K SULFATE-MG SULF 17.5-3.13-1.6 GM/177ML PO SOLN: 1.0000 | Freq: Once | ORAL | 0 refills | Status: AC

## 2024-07-04 NOTE — Progress Notes (Signed)
 [name] [id number] 1946/04/06   Chief Complaint: Discuss colonoscopy  Referring Provider: Avelina Greig BRAVO, MD Primary GI MD: [facility]  HPI: [name] is a 78 y.o. male with past medical history of Parkinson's disease, HLD, sleep apnea on CPAP who presents today to discuss colonoscopy.    Patient here today with his wife.  Had a colonoscopy in 2019 with a 5-year recall recommended due to history of colon polyps and is here to discuss setting up procedure.  He has occasional constipation, and when constipated sometimes sees blood in his stool/on the toilet paper, which he attributes to hemorrhoids.  Denies any abdominal or rectal pain with bowel movements.  He takes MiraLAX 3-4 times a week which does help with constipation.  States he is only seeing BRBPR 2-3 times a year.  Denies any diarrhea or melena.  Denies any upper GI symptoms including dysphagia, nausea, vomiting, reflux, heartburn.  States that he had been diagnosed with Parkinson's, was seeing physical therapy.  Eventually was referred to a specialist who told him that he did not have Parkinson's, so he has gone off of all of his Parkinson's medications.    Denies history of MI/stroke.  Denies any shortness of breath or chest pain.  Not on oxygen at home.  He does use a walker due to a bad leg.  Previous GI Procedures/Imaging   Colonoscopy 11/04/2018 No endoscopic evidence of polyps in the entire colon Internal hemorrhoids Recall 5 years   Past Medical History:  Diagnosis Date   Agatston coronary artery calcium  score greater than 400    Coronary calcium  score of 533. This was 69th percentile for age,   Allergy 15 yrs. or more   mosquito or wasp bites bad reaction   Aortic atherosclerosis (HCC)    Arrhythmia    Cataract 10 yrs. or more   slow bloom checkup every 6 mos.   Hyperlipidemia LDL goal <70    Neuromuscular disorder (HCC) 09/2018   Parkinsons   Parkinson disease (HCC)    Sleep apnea 02/21/2021  sleep study   have Resair machine    Past Surgical History:  Procedure Laterality Date   TONSILLECTOMY     WISDOM TOOTH EXTRACTION      Current Outpatient Medications  Medication Sig Dispense Refill   ASPIRIN 81 PO Take by mouth daily.     cetirizine  (ZYRTEC  ALLERGY) 10 MG tablet Take 1 tablet (10 mg total) by mouth daily. 90 tablet 3   Coenzyme Q10 (COQ10 PO) Take by mouth. 1 per day     furosemide  (LASIX ) 20 MG tablet Take 0.5 tablets (10 mg total) by mouth daily. (Patient taking differently: Take 10 mg by mouth daily. As needed) 45 tablet 3   glucosamine-chondroitin 500-400 MG tablet Take 1 tablet by mouth daily.     ketoconazole  (NIZORAL ) 2 % cream Apply 1 Application topically daily. 60 g 0   ketoconazole  (NIZORAL ) 2 % shampoo Apply 1 Application topically 2 (two) times a week.     metoprolol  succinate (TOPROL -XL) 25 MG 24 hr tablet Take 1 tablet (25 mg total) by mouth daily. Patient must schedule appointment for further refills first attempt 30 tablet 0   metroNIDAZOLE (METROCREAM) 0.75 % cream Apply 1 Application topically daily.     Multiple Vitamins-Minerals (MULTIVITAMIN WITH MINERALS) tablet Take 1 tablet by mouth daily.     mupirocin ointment (BACTROBAN) 2 % Apply topically as needed.     nystatin  (MYCOSTATIN /NYSTOP ) powder Apply 1 Application topically 3 (  three) times daily. 15 g 0   rosuvastatin  (CRESTOR ) 10 MG tablet Take 1 tablet by mouth once daily 30 tablet 0   No current facility-administered medications for this visit.    Allergies as of 07/04/2024 - Review Complete 07/04/2024  Allergen Reaction Noted   Bee venom  03/05/2024    Family History  Problem Relation Age of Onset   Arthritis Mother    Hearing loss Mother    Hypertension Mother    Parkinson's disease Mother    Prostate cancer Father    Hearing loss Father    Heart attack Father 56   Hyperlipidemia Father    Ulcers Father    Hypertension Sister    Heart disease Maternal Grandmother     Hypertension Maternal Grandmother    Arthritis Maternal Grandmother    Diabetes Daughter    Sleep apnea Neg Hx     Social History   Tobacco Use   Smoking status: Some Days    Types: Cigars   Smokeless tobacco: Never   Tobacco comments:    so little now not worth mentioning - cigar =special occasions, pipe also couple of times since move  Vaping Use   Vaping status: Never Used  Substance Use Topics   Alcohol use: Not Currently    Comment: 1 a day   Drug use: Never     Review of Systems:    Constitutional: No unintentional weight loss, fever, chills Cardiovascular: No chest pain Respiratory: No SOB  Gastrointestinal: See HPI and otherwise negative    Physical Exam:  Vital signs: BP 104/68   Pulse 71   Ht 6' 1 (1.854 m)   Wt 243 lb 3.2 oz (110.3 kg)   BMI 32.09 kg/m   Constitutional: Pleasant, overweight male in NAD, alert and cooperative, uses a walker Head:  Normocephalic and atraumatic.  Eyes: No scleral icterus.  Respiratory: Respirations even and unlabored. Lungs clear to auscultation bilaterally.  No wheezes, crackles, or rhonchi.  Cardiovascular:  Regular rate and rhythm. No murmurs. No peripheral edema. Gastrointestinal:  Soft, nondistended, nontender. No rebound or guarding. Normal bowel sounds. No appreciable masses or hepatomegaly. Rectal:  Not performed.  Neurologic:  Alert and oriented x4;  grossly normal neurologically.  Skin:   Dry and intact without significant lesions or rashes. Psychiatric: Oriented to person, place and time. Demonstrates good judgement and reason without abnormal affect or behaviors.   RELEVANT LABS AND IMAGING: CBC No results found for: WBC, RBC, HGB, HCT, PLT, MCV, MCH, MCHC, RDW, LYMPHSABS, MONOABS, EOSABS, BASOSABS  CMP     Component Value Date/Time   NA 140 04/25/2024 1144   K 4.1 04/25/2024 1144   CL 105 04/25/2024 1144   CO2 25 04/25/2024 1144   GLUCOSE 85 04/25/2024 1144   BUN 13  04/25/2024 1144   CREATININE 0.67 (L) 04/25/2024 1144   CALCIUM  9.2 04/25/2024 1144   PROT 6.9 04/25/2024 1144   ALBUMIN 4.3 10/18/2023 0724   AST 30 04/25/2024 1144   ALT 26 04/25/2024 1144   ALKPHOS 30 (L) 10/18/2023 0724   BILITOT 0.9 04/25/2024 1144     Assessment/Plan:   History of colon polyps Internal hemorrhoids Rectal bleeding Patient seen today to discuss repeat colonoscopy.  Last colonoscopy performed in 2019 with finding of internal hemorrhoids and recommended recall in 5 years due to history of colon polyps, though no polyps were seen on that particular exam.  He endorses occasional BRBPR if he has severe constipation and straining with bowel movements,  which he attributes to hemorrhoids.  This occurs 2-3 times a year.  He takes MiraLAX 3 to 4 days a week and this tends to keep his bowel movements regular.  Denies any diarrhea or melena.  No other concerns today.  - Schedule colonoscopy. I thoroughly discussed the procedure with the patient to include nature of the procedure, alternatives, benefits, and risks (including but not limited to bleeding, infection, perforation, anesthesia/cardiac/pulmonary complications). Patient verbalized understanding and gave verbal consent to proceed with procedure.  - Request past procedure records.   Camie Furbish, PA-C [facility] 07/04/2024, 11:17 AM  Patient Care Team: Avelina Greig BRAVO, MD as PCP - General (Family Medicine) Shlomo Wilbert SAUNDERS, MD as PCP - Cardiology (Cardiology) Buck Saucer, MD as Attending Physician (Neurology) Luke Estefana RAMAN, MD as Referring Physician (Dermatology)

## 2024-07-04 NOTE — Patient Instructions (Signed)
 You have been scheduled for a colonoscopy. Please follow written instructions given to you at your visit today.   If you use inhalers (even only as needed), please bring them with you on the day of your procedure.  DO NOT TAKE 7 DAYS PRIOR TO TEST- Trulicity (dulaglutide) Ozempic, Wegovy (semaglutide) Mounjaro (tirzepatide) Bydureon Bcise (exanatide extended release)  DO NOT TAKE 1 DAY PRIOR TO YOUR TEST Rybelsus (semaglutide) Adlyxin (lixisenatide) Victoza (liraglutide) Byetta (exanatide) ___________________________________________________________________________    Due to recent changes in healthcare laws, you may see the results of your imaging and laboratory studies on MyChart before your provider has had a chance to review them.  We understand that in some cases there may be results that are confusing or concerning to you. Not all laboratory results come back in the same time frame and the provider may be waiting for multiple results in order to interpret others.  Please give us  48 hours in order for your provider to thoroughly review all the results before contacting the office for clarification of your results.    I appreciate the  opportunity to care for you  Thank You   Camie Heinz,PA-C

## 2024-07-07 ENCOUNTER — Ambulatory Visit (AMBULATORY_SURGERY_CENTER): Admitting: Gastroenterology

## 2024-07-07 ENCOUNTER — Ambulatory Visit: Payer: Medicare HMO | Admitting: Adult Health

## 2024-07-07 ENCOUNTER — Encounter: Payer: Self-pay | Admitting: Gastroenterology

## 2024-07-07 VITALS — BP 100/50 | HR 57 | Temp 98.1°F | Resp 16 | Ht 73.0 in | Wt 243.0 lb

## 2024-07-07 DIAGNOSIS — Z1211 Encounter for screening for malignant neoplasm of colon: Secondary | ICD-10-CM | POA: Diagnosis not present

## 2024-07-07 DIAGNOSIS — K648 Other hemorrhoids: Secondary | ICD-10-CM

## 2024-07-07 DIAGNOSIS — D127 Benign neoplasm of rectosigmoid junction: Secondary | ICD-10-CM | POA: Diagnosis not present

## 2024-07-07 DIAGNOSIS — K625 Hemorrhage of anus and rectum: Secondary | ICD-10-CM | POA: Diagnosis not present

## 2024-07-07 DIAGNOSIS — D128 Benign neoplasm of rectum: Secondary | ICD-10-CM | POA: Diagnosis not present

## 2024-07-07 DIAGNOSIS — D122 Benign neoplasm of ascending colon: Secondary | ICD-10-CM | POA: Diagnosis not present

## 2024-07-07 DIAGNOSIS — D123 Benign neoplasm of transverse colon: Secondary | ICD-10-CM

## 2024-07-07 DIAGNOSIS — Z860100 Personal history of colon polyps, unspecified: Secondary | ICD-10-CM

## 2024-07-07 DIAGNOSIS — Z860101 Personal history of adenomatous and serrated colon polyps: Secondary | ICD-10-CM | POA: Diagnosis not present

## 2024-07-07 DIAGNOSIS — D125 Benign neoplasm of sigmoid colon: Secondary | ICD-10-CM | POA: Diagnosis not present

## 2024-07-07 MED ORDER — SODIUM CHLORIDE 0.9 % IV SOLN: 500.0000 mL | INTRAVENOUS | Status: DC

## 2024-07-07 NOTE — Progress Notes (Signed)
 ____________________________________________________________  Attending physician addendum:  Thank you for sending this case to me. I have reviewed the entire note and agree with the plan.   Victory Brand, MD  ____________________________________________________________

## 2024-07-07 NOTE — Patient Instructions (Signed)

## 2024-07-07 NOTE — Progress Notes (Signed)
 Vss nad trans to pacu

## 2024-07-07 NOTE — Progress Notes (Signed)
 Pt's states no medical or surgical changes since previsit or office visit.

## 2024-07-07 NOTE — Op Note (Signed)
 [name] Patient Name: [name] Procedure Date: 07/07/2024 8:53 AM MRN: [id number] Endoscopist: Victory L. Legrand , MD, [id number] Age: 78 Referring MD:  Date of Birth: 01/22/1946 Gender: Male Account #: [id number] Procedure:                Colonoscopy Indications:              Personal history of colonic polyps                           No polyps last colonoscopy 2019. Reportedly had                            [place] Medicines:                Monitored Anesthesia Care Procedure:                Pre-Anesthesia Assessment:                           - Prior to the procedure, a History and Physical                            was performed, and patient medications and                            allergies were reviewed. The patient's tolerance of                            previous anesthesia was also reviewed. The risks                            and benefits of the procedure and the sedation                            options and risks were discussed with the patient.                            All questions were answered, and informed consent                            was obtained. Prior Anticoagulants: The patient has                            taken no anticoagulant or antiplatelet agents. ASA                            Grade Assessment: III - A patient with severe                            systemic disease. After reviewing the risks and                            benefits, the patient was deemed in satisfactory  condition to undergo the procedure.                           After obtaining informed consent, the colonoscope                            was passed under direct vision. Throughout the                            procedure, the patient's blood pressure, pulse, and                            oxygen saturations were monitored continuously. The                            Olympus CF-HQ190L ([id number])  Colonoscope was                            introduced through the anus and advanced to the the                            cecum, identified by appendiceal orifice and                            ileocecal valve. The colonoscopy was performed                            without difficulty. The patient tolerated the                            procedure well. The quality of the bowel                            preparation was good. The ileocecal valve,                            appendiceal orifice, and rectum were photographed. Scope In: 9:22:34 AM Scope Out: 9:43:56 AM Scope Withdrawal Time: 0 hours 16 minutes 10 seconds  Total Procedure Duration: 0 hours 21 minutes 22 seconds  Findings:                 The perianal and digital rectal examinations were                            normal.                           Repeat examination of right colon under NBI                            performed.                           Four sessile polyps were found in the rectum,  sigmoid colon, transverse colon and proximal                            ascending colon. The polyps were 3 to 8 mm in size.                            These polyps were removed with a cold snare.                            Resection and retrieval were complete.                           Internal hemorrhoids were found.                           The exam was otherwise without abnormality on                            direct and retroflexion views. Complications:            No immediate complications. Estimated Blood Loss:     Estimated blood loss was minimal. Impression:               - Four 3 to 8 mm polyps in the rectum, in the                            sigmoid colon, in the transverse colon and in the                            proximal ascending colon, removed with a cold                            snare. Resected and retrieved.                           - Internal hemorrhoids.                            - The examination was otherwise normal on direct                            and retroflexion views. Recommendation:           - Patient has a contact number available for                            emergencies. The signs and symptoms of potential                            delayed complications were discussed with the                            patient. Return to normal activities tomorrow.  Written discharge instructions were provided to the                            patient.                           - Resume previous diet.                           - Continue present medications.                           - Await pathology results.                           - No repeat surveillance colonoscopy recommended                            due to age, current guidelines and today's findings. [name] L. Legrand, MD 07/07/2024 9:48:49 AM This report has been signed electronically.

## 2024-07-07 NOTE — Progress Notes (Signed)
 Called to room to assist during endoscopic procedure.  Patient ID and intended procedure confirmed with present staff. Received instructions for my participation in the procedure from the performing physician.

## 2024-07-07 NOTE — Progress Notes (Signed)
 No significant changes to clinical history since GI office visit on 07/04/24.  The patient is appropriate for an endoscopic procedure in the ambulatory setting.  - Victory Brand, MD

## 2024-07-08 ENCOUNTER — Telehealth: Payer: Self-pay | Admitting: Cardiology

## 2024-07-08 ENCOUNTER — Telehealth: Payer: Self-pay

## 2024-07-08 DIAGNOSIS — I700 Atherosclerosis of aorta: Secondary | ICD-10-CM

## 2024-07-08 MED ORDER — METOPROLOL SUCCINATE ER 25 MG PO TB24: 25.0000 mg | ORAL_TABLET | Freq: Every day | ORAL | 0 refills | Status: DC

## 2024-07-08 MED ORDER — ROSUVASTATIN CALCIUM 10 MG PO TABS: 10.0000 mg | ORAL_TABLET | Freq: Every day | ORAL | 0 refills | Status: DC

## 2024-07-08 NOTE — Telephone Encounter (Signed)
 RX sent in

## 2024-07-08 NOTE — Telephone Encounter (Signed)
*  STAT* If patient is at the pharmacy, call can be transferred to refill team.   1. Which medications need to be refilled? (please list name of each medication and dose if known) rosuvastatin  (CRESTOR ) 10 MG tablet   metoprolol  succinate (TOPROL -XL) 25 MG 24 hr tablet   2. Which pharmacy/location (including street and city if local pharmacy) is medication to be sent to?  [facility] [address] [place], [place] - [address]      3. Do they need a 30 day or 90 day supply? 90 day    Pt is out of medication

## 2024-07-08 NOTE — Telephone Encounter (Signed)
 Left message on answering machine.

## 2024-07-09 LAB — SURGICAL PATHOLOGY

## 2024-07-10 ENCOUNTER — Ambulatory Visit: Payer: Self-pay | Admitting: Gastroenterology

## 2024-08-06 NOTE — Progress Notes (Signed)
 Cardiology Office Note:  .   Date:  08/19/2024  ID:  [name], DOB [birth year], MRN [id number] PCP: Donald Greig BRAVO, MD  [facility] Providers Cardiologist:  Wilbert Bihari, MD    History of Present Illness: .   [name] is a 78 y.o. male with history of CAD elevated coronary calcium  score 533 2023, HLD, atrial tachycardia on BB, OSA on cpap, LE edema.  Patient here for f/u with his wife. Having a lot of trouble with his right leg. Had EMG yesterday that shows moderate polyneuropathy. Was initially told he had Parkinson's but then was told he didn't. He can walk some but using a rolator or cane. Has mod-severe spinal stenosis on MRI 04/2024.Denies chest pain, dyspnea, palpitations. Takes an occasional lasix  for LE edema every few weeks. Rides exercise bike daily for 35-45 min.   ROS:    Studies Reviewed: SABRA    EKG Interpretation Date/Time:  Tuesday August 19 2024 09:44:54 EDT Ventricular Rate:  66 PR Interval:  192 QRS Duration:  96 QT Interval:  400 QTC Calculation: 419 R Axis:   12  Text Interpretation: Normal sinus rhythm Normal ECG When compared with ECG of 23-May-2023 09:02, Confirmed by [name] [phone]) on 08/19/2024 9:50:01 AM    Prior CV Studies:   Monitor 2023 Predominant rhythm was normal sinus rhythm with average heart rate 76bpm and ranged from 34 to 136bpm. Occasional PACs and nonsustained atrial tachycardia with longest episode lasting 46.7sec and fastest episdoe 179bpm. Isolated PVC and ventricular couplets  Calcium  score 2023 FINDINGS: Non-cardiac: See separate report from [facility].   Ascending Aorta: Normal caliber.  Aortic atherosclerosis.   Pericardium: Normal.   Coronary arteries: Normal origins.   Coronary Calcium  Score:   Left main: 1   Left anterior descending artery: 13   Left circumflex artery: 18   Right coronary artery: 501   Total: 533   Percentile: 69th for age, sex, and race matched control.    IMPRESSION: 1. Coronary calcium  score of 533. This was 69th percentile for age, gender, and race matched controls.   2.  Aortic atherosclerosis.    Risk Assessment/Calculations:             Physical Exam:   VS:  BP 108/62   Pulse 66   Ht 6' 1 (1.854 m)   Wt 244 lb 9.6 oz (110.9 kg)   SpO2 97%   BMI 32.27 kg/m    Orhtostatics: No data found. Wt Readings from Last 3 Encounters:  08/19/24 244 lb 9.6 oz (110.9 kg)  07/07/24 243 lb (110.2 kg)  07/04/24 243 lb 3.2 oz (110.3 kg)    GEN: Well nourished, well developed in no acute distress NECK: No JVD; No carotid bruits CARDIAC:  RRR, no murmurs, rubs, gallops RESPIRATORY:  Clear to auscultation without rales, wheezing or rhonchi  ABDOMEN: Soft, non-tender, non-distended EXTREMITIES:  No edema; No deformity   ASSESSMENT AND PLAN: .    CAD without angina: Elevated coronary calcium  score of 533 on CT calcium  score 01/2022. He denies chest pain, dyspnea, or other symptoms concerning for angina. Continues to exercise daily with no evidence of activity intolerance. No indication for further ischemic evaluation at this time. No bleeding concerns. Continue aspirin, metoprolol , rosuvastatin .   Aortic atherosclerosis/Hyperlipidemia LDL goal < 70: LDL 36 04/2024.  Encouraged heart healthy, mostly plant-based diet and continued regular exercise.   Atrial tachycardia: Cardiac monitor completed 02/23/2022 revealed occasional PACs and nonsustained atrial tachycardia with longest  episode lasting 46.7 seconds. He denies palpitations, is overall unaware of these. He is tolerating beta blocker without any adverse effect. Continue Toprol  XL 25 mg daily.   OSA: Compliant, monitors sleep history on an app. No concerns today.    LE edema: Has chronic RLE edema that he feels is stable. Takes 10 mg of furosemide  only on occasion              Dispo: f/u Dr. Shlomo in 1 yr.  Signed, Olivia Pavy, PA-C

## 2024-08-18 ENCOUNTER — Ambulatory Visit: Admitting: Neurology

## 2024-08-18 DIAGNOSIS — R202 Paresthesia of skin: Secondary | ICD-10-CM | POA: Diagnosis not present

## 2024-08-18 DIAGNOSIS — G629 Polyneuropathy, unspecified: Secondary | ICD-10-CM

## 2024-08-18 NOTE — Procedures (Signed)
 [facility]  [address] [place], [address]  [place], [place] [address] Tel: [phone] Fax: [phone] Test Date:  08/18/2024  Patient: [name] DOB: 1946-04-27 Physician: Venetia Potters, MD  Sex: Male Height: 6' 1 Ref Phys: Penne Sharps, MD  ID#: [id number]   Technician:    History: This is a 78 year old male with gait instability and falls.  NCV & EMG Findings: Extensive electrodiagnostic evaluation of bilateral lower limbs shows: Bilateral sural and superficial peroneal/fibular sensory responses are absent. Bilateral peroneal/fibular (EDB) and tibial (AH) motor responses are absent. Bilateral peroneal/fibular (TA) motor responses show reduced amplitude (L1.69, R1.67 mV). Bilateral H reflex latencies are absent. Chronic motor axon loss changes WITH active denervation changes are seen in bilateral tibialis anterior and bilateral medial head of gastrocnemius muscles.  Impression: This is an abnormal study. 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 The findings are at least moderate in degree electrically. No definitive electrodiagnostic evidence of a right or left lumbosacral (L3-S1) motor radiculopathy.   ___________________________ Venetia Potters, MD    Nerve Conduction Studies Motor Nerve Results    Latency Amplitude F-Lat Segment Distance CV Comment  Site (ms) Norm (mV) Norm (ms)  (cm) (m/s) Norm   Left Fibular (EDB) Motor  Ankle *NR  < 6.0 *NR  > 2.5        Bel fib head *NR - *NR -  Bel fib head-Ankle - *NR  > 40   Pop fossa *NR - *NR -  Pop fossa-Bel fib head - *NR -   Right Fibular (EDB) Motor  Ankle *NR  < 6.0 *NR  > 2.5        Bel fib head *NR - *NR -  Bel fib head-Ankle - *NR  > 40   Pop fossa *NR - *NR -  Pop fossa-Bel fib head - *NR -   Left Fibular (TA) Motor  Fib head 2.6  < 4.5 *1.69  > 3.0         Pop fossa 3.9  < 6.7 1.59 -  Pop fossa-Fib head 9 69  > 40   Right Fibular (TA) Motor  Fib head 2.5  < 4.5 *1.67  > 3.0        Pop fossa 3.7  < 6.7 1.34 -  Pop fossa-Fib head 8 67  > 40   Left Tibial (AH) Motor  Ankle *NR  < 6.0 *NR  > 4.0        Knee *NR - *NR -  Knee-Ankle - *NR  > 40   Right Tibial (AH) Motor  Ankle *NR  < 6.0 *NR  > 4.0        Knee *NR - *NR -  Knee-Ankle - *NR  > 40    Sensory Sites    Neg Peak Lat Amplitude (O-P) Segment Distance Velocity Comment  Site (ms) Norm (V) Norm  (cm) (ms)   Left Superficial Fibular Sensory  14 cm-Ankle *NR  < 4.6 *NR  > 3 14 cm-Ankle 14    Right Superficial Fibular Sensory  14 cm-Ankle *NR  < 4.6 *NR  > 3 14 cm-Ankle 14    Left Sural Sensory  Calf-Lat mall *NR  < 4.6 *NR  > 3 Calf-Lat mall 14    Right Sural Sensory  Calf-Lat mall *NR  < 4.6 *NR  > 3 Calf-Lat mall 14  H-Reflex Results    M-Lat H Lat H Neg Amp H-M Lat  Site (ms) (ms) Norm (mV) (ms)  Left Tibial H-Reflex  Pop fossa 5.2 -  < 35.0 - -  Right Tibial H-Reflex  Pop fossa 3.6 -  < 35.0 - -   Electromyography   Side Muscle Ins.Act Fibs Fasc Recrt Amp Dur Poly Activation Comment  Left Tib ant Nml *1+ Nml *SMU *2+ *2+ *1+ Nml N/A  Left Gastroc MH Nml *1+ Nml *3- *2+ *2+ *1+ Nml N/A  Left Rectus fem Nml Nml Nml Nml Nml Nml Nml Nml N/A  Left Biceps fem SH Nml Nml Nml Nml Nml Nml Nml Nml N/A  Left Gluteus med Nml Nml Nml Nml Nml Nml Nml Nml N/A  Right Tib ant Nml *1+ Nml *3- *2+ *2+ *1+ Nml N/A  Right Gastroc MH Nml *1+ Nml *3- *2+ *2+ *1+ Nml N/A  Right Rectus fem Nml Nml Nml Nml Nml Nml Nml Nml N/A  Right Biceps fem SH Nml Nml Nml Nml Nml Nml Nml Nml N/A  Right Gluteus med Nml Nml Nml Nml Nml Nml Nml Nml N/A  Right Gluteus max Nml Nml Nml Nml Nml Nml Nml Nml N/A  Right Lumbar PSP lower Nml Nml Nml Nml Nml Nml Nml Nml N/A      Waveforms:  Motor               Sensory           H-Reflex

## 2024-08-19 ENCOUNTER — Encounter: Payer: Self-pay | Admitting: Physician Assistant

## 2024-08-19 ENCOUNTER — Ambulatory Visit: Attending: Physician Assistant | Admitting: Physician Assistant

## 2024-08-19 ENCOUNTER — Ambulatory Visit: Payer: Self-pay | Admitting: Neurosurgery

## 2024-08-19 VITALS — BP 108/62 | HR 66 | Ht 73.0 in | Wt 244.6 lb

## 2024-08-19 DIAGNOSIS — I700 Atherosclerosis of aorta: Secondary | ICD-10-CM

## 2024-08-19 DIAGNOSIS — R600 Localized edema: Secondary | ICD-10-CM

## 2024-08-19 DIAGNOSIS — I4719 Other supraventricular tachycardia: Secondary | ICD-10-CM

## 2024-08-19 DIAGNOSIS — R931 Abnormal findings on diagnostic imaging of heart and coronary circulation: Secondary | ICD-10-CM

## 2024-08-19 DIAGNOSIS — G4733 Obstructive sleep apnea (adult) (pediatric): Secondary | ICD-10-CM | POA: Diagnosis not present

## 2024-08-19 NOTE — Patient Instructions (Signed)
 Follow-Up: At [facility], you and your health needs are our priority.  As part of our continuing mission to provide you with exceptional heart care, our providers are all part of one team.  This team includes your primary Cardiologist (physician) and Advanced Practice Providers or APPs (Physician Assistants and Nurse Practitioners) who all work together to provide you with the care you need, when you need it.  Your next appointment:   1 year(s)  Provider:   Gaylyn Keas, MD

## 2024-08-21 ENCOUNTER — Telehealth: Payer: Self-pay | Admitting: Neurology

## 2024-08-21 NOTE — Telephone Encounter (Signed)
 Called patients wife and they are questioning the EMG results. Dr. Claudene had sent patient a My chart message stating that the belief is that it is polyneuropathy coming from diabetes. Patient had bloodwork done and he is not diabetic or even prediabetic. Patients wife wanting to know what they need to do next. Dr. Claudene put in the note to follow up with PCP and or Neurology and patients  wife wanting to know if they need blood work or exactly what the EMG meant. Patient is seeing Dr. Evonnie on 10/07/24

## 2024-08-21 NOTE — Telephone Encounter (Signed)
 Follow-up with Dr. Evonnie as scheduled.  I think patient may have misunderstood, as Dr. Theressa note indicates that diabetes and alcohol are the most common causes, but many individuals do not have these risk factors and are diagnosed with idiopathic neuropathy.  Please reassure pt that he did not mention that pt has diabetes.  Thanks.

## 2024-08-21 NOTE — Telephone Encounter (Signed)
 Pt.s wife called for results and next steps

## 2024-08-22 NOTE — Telephone Encounter (Signed)
 Called pateint and spoke to him and his wife they are happy to get Dr. Cyndia recommendations

## 2024-08-28 DIAGNOSIS — B372 Candidiasis of skin and nail: Secondary | ICD-10-CM | POA: Diagnosis not present

## 2024-08-28 DIAGNOSIS — D170 Benign lipomatous neoplasm of skin and subcutaneous tissue of head, face and neck: Secondary | ICD-10-CM | POA: Diagnosis not present

## 2024-08-28 DIAGNOSIS — L821 Other seborrheic keratosis: Secondary | ICD-10-CM | POA: Diagnosis not present

## 2024-08-28 DIAGNOSIS — D1801 Hemangioma of skin and subcutaneous tissue: Secondary | ICD-10-CM | POA: Diagnosis not present

## 2024-08-28 DIAGNOSIS — L218 Other seborrheic dermatitis: Secondary | ICD-10-CM | POA: Diagnosis not present

## 2024-09-09 ENCOUNTER — Telehealth: Payer: Self-pay | Admitting: Physical Therapy

## 2024-09-09 ENCOUNTER — Ambulatory Visit: Attending: Family Medicine | Admitting: Physical Therapy

## 2024-09-09 ENCOUNTER — Ambulatory Visit: Admitting: Occupational Therapy

## 2024-09-09 ENCOUNTER — Ambulatory Visit: Admitting: Speech Pathology

## 2024-09-09 DIAGNOSIS — R2681 Unsteadiness on feet: Secondary | ICD-10-CM | POA: Insufficient documentation

## 2024-09-09 DIAGNOSIS — R293 Abnormal posture: Secondary | ICD-10-CM

## 2024-09-09 DIAGNOSIS — M6281 Muscle weakness (generalized): Secondary | ICD-10-CM | POA: Insufficient documentation

## 2024-09-09 DIAGNOSIS — R2689 Other abnormalities of gait and mobility: Secondary | ICD-10-CM | POA: Insufficient documentation

## 2024-09-09 DIAGNOSIS — R29818 Other symptoms and signs involving the nervous system: Secondary | ICD-10-CM

## 2024-09-09 NOTE — Therapy (Unsigned)
 Occupational Therapy Parkinson's Disease Screen  Hand dominance:  right   Physical Performance Test item #2 (simulated eating):  13 sec  Physical Performance Test item #4 (donning/doffing jacket):  19 sec  Fastening/unfastening 3 buttons in:  23 sec  9-hole peg test:    RUE  29 sec        LUE  29 sec  Box & Blocks Test:   RUE  46 blocks        LUE  39 blocks  Change in ability to perform ADLs/IADLs:  Pt reports no significant changes to occupational performance.   Other Comments:  Pt reports he was told he does not have PD and has been off medications to treat PD without any change.    Pt does not require occupational therapy services at this time.  Despite no longer having PD dx, recommend pt continue with prior HEP to maintain current level of function. He has been able to maintain this since original OT POC.   FUNCTIONAL OUTCOME MEASURES: Fastening/unfastening 3 buttons: 26 sec Physical performance test: PPT#2 (simulated eating) 15 & PPT#4 (donning/doffing jacket): 21 sec with button up jacket with buttoned cuffs   COORDINATION: 9 Hole Peg test: Right: 28 sec; Left: 29 sec  RUE 47;  LUE 43 blocks

## 2024-09-09 NOTE — Therapy (Signed)
 [facility] [address] [place], [place], [address] Phone: [phone]   Fax:  [phone]  Patient Details  Name: [name] MRN: [id number] Date of Birth: 17-Aug-1946 Referring Provider:  Avelina Greig BRAVO, MD  Encounter Date: 09/09/2024  Physical Therapy Parkinson's Disease Screen   Timed Up and Go test:16.06s w/rollator (previously 17.5s w/rollator)   10 meter walk test:32.8' over 13.22s = 2.48 ft/s w/rollator (previously 2.29 ft/s)    5 time sit to stand test: 26.25s w/BUE support (previously 20.3s no UE support)   Pt presents w/rollator and strong arm cane. Pt states he was undiagnosed w/PD by Dr. 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 Pt states his RLE has almost gone out on him a few times, but has not had any falls. Pt denies numbness/tingling in his legs and states he has no burning sensation. Pt would like to work on strengthening his RLE, so patient would benefit from Physical Therapy evaluation due to new diagnosis of polyneuropathy.     Amaal Dimartino E Zayin Valadez, PT, DPT 09/09/2024, 1:40 PM  [facility] [address] [place], [place], [address] Phone: [phone]   Fax:  [phone]

## 2024-09-09 NOTE — Telephone Encounter (Signed)
 Dr. Evonnie,  Lorene CHRISTELLA Sharps  was seen by PT today for a balance screen and would benefit from a PT evaluation for polyneuropathy.    If you agree, please place an order in [facility] workque in [facility] or fax the order to [phone].  Thank you,  Marlon FORBES Dural, PT, DPT [facility] [address] [place], [place]  [address] Phone:  [phone] Fax:  [phone]

## 2024-09-10 ENCOUNTER — Telehealth: Payer: Self-pay | Admitting: *Deleted

## 2024-09-10 DIAGNOSIS — R2689 Other abnormalities of gait and mobility: Secondary | ICD-10-CM

## 2024-09-10 DIAGNOSIS — G20C Parkinsonism, unspecified: Secondary | ICD-10-CM

## 2024-09-10 NOTE — Telephone Encounter (Signed)
 Copied from CRM #[id number]. Topic: General - Other >> Sep 10, 2024  2:13 PM Mercedes MATSU wrote: Reason for CRM: Patient called in wanting to speak with Amy. Patient can be reached at [phone] Bastian Andreoli (patients wife) its in regards of the proila shot.

## 2024-09-10 NOTE — Telephone Encounter (Signed)
 Spoke with [name].  She is wanting to know if Dr. Avelina would send a referral to [facility] to continue with PT.  He has been doing PT there for balance and strength.  He set up an appointment with Dr. Avelina for 08/17/2024 if needed to discuss this referral.  Orell are seeing Dr. Evonnie on 09/23/2024 but they don't think she will do the referral until she sees him and they would like to be able to continue PT next week if Dr. Avelina is agreeable to placing the referral.  Please advise.

## 2024-09-11 NOTE — Telephone Encounter (Signed)
 Given patient has already been doing physical therapy I will place a referral without seeing him to continue this. It is at [facility] where I do not usually refer so I am not sure if they will accept my referral.   I am assuming the reason is Parkinson's associated balance issues.  Can someone with referrals , let me know if I placed the referral to [facility] rehab  correctly or if I can even refer to [facility].

## 2024-09-11 NOTE — Addendum Note (Signed)
 Addended by: WENDELL ARLAND RAMAN on: 09/11/2024 10:02 AM   Modules accepted: Orders

## 2024-09-11 NOTE — Telephone Encounter (Signed)
 Referral placed.   Dr. Avelina, please sign order.

## 2024-09-11 NOTE — Telephone Encounter (Signed)
 Noted.  Will sign order

## 2024-09-15 ENCOUNTER — Telehealth: Payer: Self-pay | Admitting: Family Medicine

## 2024-09-15 NOTE — Telephone Encounter (Signed)
 CRM was created for the wrong pt's chart.

## 2024-09-15 NOTE — Telephone Encounter (Signed)
 Copied from CRM #[id number]. Topic: General - Other >> Sep 15, 2024  1:32 PM Victoria A wrote: Reason for CRM: Patients wife asked for Morna to call her back please

## 2024-09-17 ENCOUNTER — Ambulatory Visit: Admitting: Family Medicine

## 2024-09-17 ENCOUNTER — Ambulatory Visit: Admitting: Physical Therapy

## 2024-09-18 ENCOUNTER — Encounter: Payer: Self-pay | Admitting: Physical Therapy

## 2024-09-18 ENCOUNTER — Ambulatory Visit: Attending: Family Medicine | Admitting: Physical Therapy

## 2024-09-18 DIAGNOSIS — R2681 Unsteadiness on feet: Secondary | ICD-10-CM | POA: Insufficient documentation

## 2024-09-18 DIAGNOSIS — R293 Abnormal posture: Secondary | ICD-10-CM | POA: Insufficient documentation

## 2024-09-18 DIAGNOSIS — R278 Other lack of coordination: Secondary | ICD-10-CM | POA: Diagnosis not present

## 2024-09-18 DIAGNOSIS — M6281 Muscle weakness (generalized): Secondary | ICD-10-CM | POA: Insufficient documentation

## 2024-09-18 DIAGNOSIS — R2689 Other abnormalities of gait and mobility: Secondary | ICD-10-CM | POA: Diagnosis not present

## 2024-09-18 DIAGNOSIS — G20C Parkinsonism, unspecified: Secondary | ICD-10-CM | POA: Diagnosis not present

## 2024-09-18 NOTE — Therapy (Signed)
 OUTPATIENT PHYSICAL THERAPY NEURO EVALUATION   Patient Name: [name] MRN: [id number] DOB:07-Mar-1946, 78 y.o., male Today's Date: 09/18/2024   PCP: Donald Greig BRAVO, MD REFERRING PROVIDER: Avelina Greig BRAVO, MD  END OF SESSION:  PT End of Session - 09/18/24 1403     Visit Number 1    Number of Visits 13    Date for Recertification  11/06/24    Authorization Type [employer]    PT Start Time 1400    PT Stop Time 1451    PT Time Calculation (min) 51 min    Activity Tolerance Patient tolerated treatment well    Behavior During Therapy WFL for tasks assessed/performed          Past Medical History:  Diagnosis Date   Agatston coronary artery calcium  score greater than 400    Coronary calcium  score of 533. This was 69th percentile for age,   Allergy 15 yrs. or more   mosquito or wasp bites bad reaction   Aortic atherosclerosis    Arrhythmia    Cataract 10 yrs. or more   slow bloom checkup every 6 mos.   Hyperlipidemia LDL goal <70    Neuromuscular disorder (HCC) 09/2018   Parkinsons   Parkinson disease (HCC)    Sleep apnea 02/21/2021 sleep study   have Resair machine   Past Surgical History:  Procedure Laterality Date   TONSILLECTOMY     WISDOM TOOTH EXTRACTION     Patient Active Problem List   Diagnosis Date Noted   Gait instability 06/23/2024   Scoliosis 06/23/2024   Agatston coronary artery calcium  score greater than 400 02/02/2022   Aortic atherosclerosis 02/01/2022   SVT (supraventricular tachycardia) 01/12/2022   OSA (obstructive sleep apnea) 01/12/2022   Parkinsonism (HCC) 11/25/2020   Edema of right lower leg 11/25/2020   Hyperlipidemia LDL goal <70 11/25/2020   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   Hearing loss 11/25/2020   Pityriasis rosea 11/25/2020    ONSET DATE: 09/11/2024 (referral)   REFERRING DIAG: G20.C (ICD-10-CM) - Parkinsonism, unspecified Parkinsonism type (HCC) R26.89 (ICD-10-CM) - Balance problems  THERAPY DIAG:  Abnormal  posture  Muscle weakness (generalized)  Unsteadiness on feet  Other abnormalities of gait and mobility  Other lack of coordination  Rationale for Evaluation and Treatment: Rehabilitation  SUBJECTIVE:                                                                                                                                                                                             SUBJECTIVE STATEMENT: Pt, who is well known to this therapist, presents with rollator and StrongArm cane  propped on rollator. Pt reports he was undiagnosed w/PD this summer and has stopped taking Carbidopa -Levodopa . Pt states his RLE is giving him issues, frequently going out on him and is difficult to move at times. Pt has had MRIs and NCV w/EMG performed but has not been communicated with regarding results. Pt denies numbness/tingling in BLEs and also denies pain. Pt confused regarding results of NCV w/EMG as he does not have symptoms of neuropathy.   Pt reports sometimes he can stand up and his RLE feels stable. Other times it feels as though his leg is going to give out on him. Pt denies difficulty w/his LLE. Pt denies falls, but uses rollator most of the time as he is fearful of his leg giving out.    Pt accompanied by: Wife, [name] in lobby   PERTINENT HISTORY:  HLD, cellulitis, lymphedema, low back pain, hearing loss, (undiagnosed w/PD in 2025)  PAIN:  Are you having pain? No  PRECAUTIONS: Fall  RED FLAGS: None   WEIGHT BEARING RESTRICTIONS: No  FALLS: Has patient fallen in last 6 months? No  LIVING ENVIRONMENT: Lives with: lives with their spouse Lives in: House/apartment Stairs: Yes: External: 2-3 steps; on right going up, on left going up, and can reach both Has following equipment at home: Walker - 4 wheeled, shower chair, Ramped entry, and StrongArm cane  PLOF: Requires assistive device for independence  PATIENT GOALS: I want to be able to stand up and walk without this thing  (rollator) and strengthen this thing (RLE)   OBJECTIVE:  Note: Objective measures were completed at Evaluation unless otherwise noted.  DIAGNOSTIC FINDINGS: MRI of C-spine from 06/05/24  IMPRESSION: 1. Cervical spondylosis as outlined within the body of the report. 2. No significant spinal canal stenosis. 3. Multilevel foraminal stenosis, greatest on the right at C5-C6 (moderate at this site). 4. No more than mild disc degeneration within the cervical spine. 5. Multilevel facet arthropathy, greatest on the left at C4-C5 (advanced at this site). 6. Facet ankylosis on the left at C3-C4. 7. Mild grade 1 anterolisthesis at C4-C5 and C5-C6.  MRI of lumbar spine from 04/30/24 IMPRESSION: 1. 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 2. 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 3. Mild spinal canal stenosis at L2-3. 4. No spinal canal stenosis or neural foraminal narrowing in the thoracic spine.  MRI of T-spine 04/30/24 IMPRESSION: 1. 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 2. 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 3. Mild spinal canal stenosis at L2-3. 4. No spinal canal stenosis or neural foraminal narrowing in the thoracic spine.  NCV w/EMG from 06/30/24  NCV & EMG Findings: Extensive electrodiagnostic evaluation of bilateral lower limbs shows: Bilateral sural and superficial peroneal/fibular sensory responses are absent. Bilateral peroneal/fibular (EDB) and tibial (AH) motor responses are absent. Bilateral peroneal/fibular (TA) motor responses show reduced amplitude (L1.69, R1.67 mV). Bilateral H reflex latencies are absent. Chronic motor axon loss changes WITH active denervation changes are seen in bilateral tibialis anterior and  bilateral medial head of gastrocnemius muscles.   Impression: This is an abnormal study. 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 The findings are at least moderate in degree electrically. No definitive electrodiagnostic evidence of a right or  left lumbosacral (L3-S1) motor radiculopathy.  COGNITION: Overall cognitive status: Within functional limits for tasks assessed   SENSATION: Pt denies numbness/tingling in all extremities    EDEMA: Pt has chronic swelling of distal RLE    POSTURE: rounded shoulders, forward head, increased thoracic kyphosis, and flexed trunk   LOWER EXTREMITY ROM:     Active  Right Eval Left Eval  Hip flexion    Hip extension    Hip abduction    Hip adduction    Hip internal rotation    Hip external rotation    Knee flexion    Knee extension    Ankle dorsiflexion    Ankle plantarflexion    Ankle inversion    Ankle eversion     (Blank rows = not tested)  LOWER EXTREMITY MMT:  Tested in seated position   MMT Right Eval Left Eval  Hip flexion 5 5  Hip extension    Hip abduction 5 5  Hip adduction 5 5  Hip internal rotation    Hip external rotation    Knee flexion 5 5  Knee extension 5 5  Ankle dorsiflexion 5 5  Ankle plantarflexion    Ankle inversion    Ankle eversion    (Blank rows = not tested)  BED MOBILITY:  Not tested Independent per pt   TRANSFERS: Sit to stand: Modified independence  Assistive device utilized: None     Stand to sit: Modified independence  Assistive device utilized: None      RAMP:  Not tested  CURB:  Not tested  STAIRS: Not tested GAIT: Gait pattern: Bilateral genu valgus, excessive R ankle inversion/pronation, step through pattern, decreased step length- Left, decreased stance time- Right, decreased stride length, decreased hip/knee flexion- Right,  decreased ankle dorsiflexion- Right, decreased ankle dorsiflexion- Left, lateral hip instability, lateral lean- Right, decreased trunk rotation, trunk flexed, and narrow BOS Distance walked: Various short clinic distances  Assistive device utilized: Walker - 4 wheeled Level of assistance: SBA Comments: Pt ambulates w/excessive thoracic kyphosis and forward flexed posture.    FUNCTIONAL TESTS:   [facility] PT Assessment - 09/18/24 1437       Transfers   Five time sit to stand comments  22.75s   BUE support, lack of full hip extension                                                                                                                                      TREATMENT:   Self-care/home management  Provided therapeutic listening as pt discussed all the doctors he has seen and his test results.  Discussed PT POC and plan to work on resistance training to strengthen posterior chain and core stability for longevity and functional use of RLE. Pt in agreement with this.  Verbally reviewed pt's HEP from previous POC and informed pt we will remove some exercises in future sessions, but for now his HEP is  still appropriate despite no longer having PD diagnosis.    PATIENT EDUCATION: Education details: PT POC, review of HEP, eval findings  Person educated: Patient Education method: Medical Illustrator Education comprehension: verbalized understanding and needs further education  HOME EXERCISE PROGRAM: From previous POC: Access Code: [email or web address]   GOALS: Goals reviewed with patient? Yes  SHORT TERM GOALS: Target date: 10/16/2024    Pt will improve 5 x STS to less than or equal to 20 seconds without BUE support to demonstrate improved functional strength and transfer efficiency.   Baseline: 22.75s w/BUE support  Goal status: INITIAL  2.  to be assessed and STG/LTG updated  Baseline:  Goal status: INITIAL  3.  to be assessed and LTG updated  Baseline:  Goal  status: INITIAL  4.  FGA to be assessed and LTG updated  Baseline:  Goal status: INITIAL  5.  Pt will lift 20# weight from ground using proper body mechanics for improved posterior chain strength and stability w/lifting.  Baseline:  Goal status: INITIAL   LONG TERM GOALS: Target date: 10/30/2024    Pt will improve 5 x STS to less than or equal to 15 seconds without UE support to demonstrate improved functional strength and transfer efficiency.   Baseline: 22.75s w/BUE support  Goal status: INITIAL  2.  goal  Baseline:  Goal status: INITIAL  3.  goal  Baseline:  Goal status: INITIAL  4.  FGA goal  Baseline:  Goal status: INITIAL  5.  Pt will lift 30# weight from ground using proper body mechanics for improved posterior chain strength and stability w/lifting.  Baseline:  Goal status: INITIAL  ASSESSMENT:  CLINICAL IMPRESSION: Patient is a 78 year old male referred to Neuro OPPT for balance impairment. Pt's PMH is significant for: HLD, cellulitis, lymphedema, low back pain, hearing loss, (undiagnosed w/PD in 2025). The following deficits were present during the exam: impaired functional strength, impaired gait kinematics, postural deficits and balance deficits. Based on 5x STS, pt is an incr risk for falls. Will further assess balance next session. Pt would benefit from skilled PT to address these impairments and functional limitations to maximize functional mobility independence.    OBJECTIVE IMPAIRMENTS: Abnormal gait, decreased activity tolerance, decreased balance, decreased endurance, decreased knowledge of condition, decreased mobility, difficulty walking, decreased strength, improper body mechanics, and postural dysfunction  ACTIVITY LIMITATIONS: carrying, lifting, bending, squatting, stairs, transfers, locomotion level, and caring for others  PARTICIPATION LIMITATIONS: cleaning, interpersonal relationship, driving, shopping, community activity, and yard  work  PERSONAL FACTORS: 1 comorbidity: bilateral peripheral neuropathy are also affecting patient's functional outcome.   REHAB POTENTIAL: Good  CLINICAL DECISION MAKING: Evolving/moderate complexity  EVALUATION COMPLEXITY: Moderate  PLAN:  PT FREQUENCY: 1-2x/week  PT DURATION: 6 weeks  PLANNED INTERVENTIONS: [id number]- PT Re-evaluation, 97750- Physical Performance Testing, 97110-Therapeutic exercises, 97530- Therapeutic activity, [id number]- Neuromuscular re-education, 97535- Self Care, [id number]- Manual therapy, [id number]- Gait training, [phone]- Orthotic Initial, [phone]- Orthotic/Prosthetic subsequent, [phone]- Aquatic Therapy, [phone]- Electrical stimulation (manual), [phone] (1-2 muscles), [id number] (3+ muscles)- Dry Needling, Patient/Family education, Balance training, Stair training, Joint mobilization, Spinal mobilization, Vestibular training, and DME instructions  PLAN FOR NEXT SESSION: , FGA and and update goals. Review HEP. Work on lifting, sit to stands w/reduced UE reliance, leg presses, functional hip strength    Chemere Steffler E Bach Rocchi, PT, DPT 09/18/2024, 4:16 PM

## 2024-09-23 ENCOUNTER — Ambulatory Visit: Admitting: Physical Therapy

## 2024-09-23 DIAGNOSIS — R293 Abnormal posture: Secondary | ICD-10-CM | POA: Diagnosis not present

## 2024-09-23 DIAGNOSIS — R2689 Other abnormalities of gait and mobility: Secondary | ICD-10-CM

## 2024-09-23 DIAGNOSIS — M6281 Muscle weakness (generalized): Secondary | ICD-10-CM

## 2024-09-23 DIAGNOSIS — R2681 Unsteadiness on feet: Secondary | ICD-10-CM

## 2024-09-23 DIAGNOSIS — G20C Parkinsonism, unspecified: Secondary | ICD-10-CM | POA: Diagnosis not present

## 2024-09-23 DIAGNOSIS — R278 Other lack of coordination: Secondary | ICD-10-CM

## 2024-09-23 NOTE — Therapy (Signed)
 OUTPATIENT PHYSICAL THERAPY NEURO TREATMENT   Patient Name: CHRISTOPH COPELAN MRN: [id number] DOB:1946/10/20, 78 y.o., male Today's Date: 09/23/2024   PCP: Avelina Greig BRAVO, MD REFERRING PROVIDER: Avelina Greig BRAVO, MD  END OF SESSION:  PT End of Session - 09/23/24 1059     Visit Number 2    Number of Visits 13    Date for Recertification  11/06/24    Authorization Type [employer]    PT Start Time 1059    PT Stop Time 1146    PT Time Calculation (min) 47 min    Equipment Utilized During Treatment Gait belt    Activity Tolerance Patient tolerated treatment well    Behavior During Therapy WFL for tasks assessed/performed           Past Medical History:  Diagnosis Date   Agatston coronary artery calcium  score greater than 400    Coronary calcium  score of 533. This was 69th percentile for age,   Allergy 15 yrs. or more   mosquito or wasp bites bad reaction   Aortic atherosclerosis    Arrhythmia    Cataract 10 yrs. or more   slow bloom checkup every 6 mos.   Hyperlipidemia LDL goal <70    Neuromuscular disorder (HCC) 09/2018   Parkinsons   Parkinson disease (HCC)    Sleep apnea 02/21/2021 sleep study   have Resair machine   Past Surgical History:  Procedure Laterality Date   TONSILLECTOMY     WISDOM TOOTH EXTRACTION     Patient Active Problem List   Diagnosis Date Noted   Gait instability 06/23/2024   Scoliosis 06/23/2024   Agatston coronary artery calcium  score greater than 400 02/02/2022   Aortic atherosclerosis 02/01/2022   SVT (supraventricular tachycardia) 01/12/2022   OSA (obstructive sleep apnea) 01/12/2022   Parkinsonism (HCC) 11/25/2020   Edema of right lower leg 11/25/2020   Hyperlipidemia LDL goal <70 11/25/2020   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   Hearing loss 11/25/2020   Pityriasis rosea 11/25/2020    ONSET DATE: 09/11/2024 (referral)   REFERRING DIAG: G20.C (ICD-10-CM) - Parkinsonism, unspecified Parkinsonism type (HCC) R26.89 (ICD-10-CM)  - Balance problems  THERAPY DIAG:  Abnormal posture  Muscle weakness (generalized)  Unsteadiness on feet  Other abnormalities of gait and mobility  Other lack of coordination  Rationale for Evaluation and Treatment: Rehabilitation  SUBJECTIVE:                                                                                                                                                                                             SUBJECTIVE STATEMENT: Pt presents w/rollator. Denies  acute changes or falls.    Pt accompanied by: Wife, [name] in lobby   PERTINENT HISTORY:  HLD, cellulitis, lymphedema, low back pain, hearing loss, (undiagnosed w/PD in 2025)  PAIN:  Are you having pain? No  PRECAUTIONS: Fall  RED FLAGS: None   WEIGHT BEARING RESTRICTIONS: No  FALLS: Has patient fallen in last 6 months? No  LIVING ENVIRONMENT: Lives with: lives with their spouse Lives in: House/apartment Stairs: Yes: External: 2-3 steps; on right going up, on left going up, and can reach both Has following equipment at home: Walker - 4 wheeled, shower chair, Ramped entry, and StrongArm cane  PLOF: Requires assistive device for independence  PATIENT GOALS: I want to be able to stand up and walk without this thing (rollator) and strengthen this thing (RLE)   OBJECTIVE:  Note: Objective measures were completed at Evaluation unless otherwise noted.  DIAGNOSTIC FINDINGS: MRI of C-spine from 06/05/24  IMPRESSION: 1. Cervical spondylosis as outlined within the body of the report. 2. No significant spinal canal stenosis. 3. Multilevel foraminal stenosis, greatest on the right at C5-C6 (moderate at this site). 4. No more than mild disc degeneration within the cervical spine. 5. Multilevel facet arthropathy, greatest on the left at C4-C5 (advanced at this site). 6. Facet ankylosis on the left at C3-C4. 7. Mild grade 1 anterolisthesis at C4-C5 and C5-C6.  MRI of lumbar spine from  04/30/24 IMPRESSION: 1. 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 2. 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 3. Mild spinal canal stenosis at L2-3. 4. No spinal canal stenosis or neural foraminal narrowing in the thoracic spine.  MRI of T-spine 04/30/24 IMPRESSION: 1. 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 2. 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 3. Mild spinal canal stenosis at L2-3. 4. No spinal canal stenosis or neural foraminal narrowing in the thoracic spine.  NCV w/EMG from 06/30/24  NCV & EMG Findings: Extensive electrodiagnostic evaluation of bilateral lower limbs shows: Bilateral sural and superficial peroneal/fibular sensory responses are absent. Bilateral peroneal/fibular (EDB) and tibial (AH) motor responses are absent. Bilateral peroneal/fibular (TA) motor responses show reduced amplitude (L1.69, R1.67 mV). Bilateral H reflex latencies are absent. Chronic motor axon loss changes WITH active denervation changes are seen in bilateral tibialis anterior and bilateral medial head of gastrocnemius muscles.   Impression: This is an abnormal study. 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 The findings are at least moderate in degree electrically. No definitive electrodiagnostic evidence of a right or left lumbosacral (L3-S1) motor radiculopathy.  COGNITION: Overall cognitive status: Within functional limits for tasks assessed   SENSATION: Pt denies numbness/tingling in all extremities    EDEMA: Pt has chronic swelling of distal RLE    POSTURE: rounded shoulders, forward  head, increased thoracic kyphosis, and flexed trunk   LOWER EXTREMITY ROM:     Active  Right Eval Left Eval  Hip flexion    Hip extension    Hip abduction    Hip adduction    Hip internal rotation    Hip external rotation    Knee flexion    Knee extension    Ankle dorsiflexion    Ankle plantarflexion    Ankle inversion    Ankle eversion     (  Blank rows = not tested)  LOWER EXTREMITY MMT:  Tested in seated position   MMT Right Eval Left Eval  Hip flexion 5 5  Hip extension    Hip abduction 5 5  Hip adduction 5 5  Hip internal rotation    Hip external rotation    Knee flexion 5 5  Knee extension 5 5  Ankle dorsiflexion 5 5  Ankle plantarflexion    Ankle inversion    Ankle eversion    (Blank rows = not tested)  BED MOBILITY:  Not tested Independent per pt   TRANSFERS: Sit to stand: Modified independence  Assistive device utilized: None     Stand to sit: Modified independence  Assistive device utilized: None      RAMP:  Not tested  CURB:  Not tested  STAIRS: Not tested GAIT: Gait pattern: Bilateral genu valgus, excessive R ankle inversion/pronation, step through pattern, decreased step length- Left, decreased stance time- Right, decreased stride length, decreased hip/knee flexion- Right, decreased ankle dorsiflexion- Right, decreased ankle dorsiflexion- Left, lateral hip instability, lateral lean- Right, decreased trunk rotation, trunk flexed, and narrow BOS Distance walked: Various short clinic distances  Assistive device utilized: Walker - 4 wheeled Level of assistance: SBA Comments: Pt ambulates w/excessive thoracic kyphosis and forward flexed posture.    FUNCTIONAL TESTS:   [facility] PT Assessment - 09/23/24 1103       Ambulation/Gait   Gait velocity 32.8' over 11.69s = 2.81 ft/s w/rollator                                                                                                                                        TREATMENT:    Physical Performance   [facility] PT Assessment - 09/23/24 1103       Ambulation/Gait   Gait velocity 32.8' over 11.69s = 2.81 ft/s w/rollator            Gait pattern: Bilateral genu valgus, trendelenburg on L, step through pattern, decreased step length- Right, decreased stance time- Left, decreased stride length, decreased ankle dorsiflexion- Right, trendelenburg, lateral hip instability, trunk flexed, and narrow BOS Distance walked: 115' loop completed 8x + 93' = 1,013'  Assistive device utilized: Environmental Consultant - 4 wheeled Level of assistance: SBA Comments: RPE of 1.5/10 following activity. Noted worsening kyphotic posture throughout assessment that pt unaware of. Also noted significant hip drop on LLE, resulting in decreased eccentric control of R IC.    Ther Act  Modified HEP (see below) and struck out exercises that pt has not been performing.  Pt performed sit > supine on mat mod I and the following were performed for improved glute strength and postural control:  Supine glute bridges, x10 reps w/3-5s isometric hold. Noted decreased hip extension of LLE vs R  Sidelying clamshells, x15 reps per side. Increased difficulty on LLE w/decreased A/ROM noted. Pt also reported increased difficulty performing on LLE.  PATIENT EDUCATION: Education details:  Results of , Updates to HEP Person educated: Patient Education method: Medical Illustrator Education comprehension: verbalized understanding and needs further education  HOME EXERCISE PROGRAM: From previous POC: Access Code: [email or web address]   Access Code: [email or web address] URL: [email or web address] Date: 09/23/2024 Prepared by: Marlon Kellyann Ordway  Exercises - Wall push up with strong push away  - 1 x daily - 7 x weekly - 3 sets - 10 reps - Dead Bug  - 1 x daily - 7 x weekly - 3 sets - 10 reps - Correct Standing Posture  - 1 x daily - 5 x weekly - 30 hold - Standing Balance with Eyes Closed on Foam  - 1 x daily - 5 x  weekly - 3 sets - 30 hold - Standing march with overhead hold   - 1 x daily - 7 x weekly - 3 sets - 10 reps - Seated Ankle Alphabet  - 1 x daily - 7 x weekly - 3 sets - 10 reps - Ankle Inversion with Resistance  - 1 x daily - 7 x weekly - 3 sets - 10 reps - Ankle Eversion with Resistance  - 1 x daily - 7 x weekly - 3 sets - 10 reps - Ankle Dorsiflexion with Resistance  - 1 x daily - 7 x weekly - 3 sets - 10 reps - Ankle and Toe Plantarflexion with Resistance  - 1 x daily - 7 x weekly - 3 sets - 10 reps - Calf stretch  - 1 x daily - 7 x weekly - 3 sets - 10 reps - Seated Cervical Sidebending Stretch  - 1 x daily - 7 x weekly - 1 sets - 4 reps - 30 second hold - Single Arm Doorway Pec Stretch at 90 Degrees Abduction  - 1 x daily - 7 x weekly - 2-3 reps - 30-60 seconds  hold - Static Prone on Elbows  - 1 x daily - 7 x weekly - 2 sets - 10 reps - Supine Bridge  - 1 x daily - 7 x weekly - 3 sets - 10 reps - 3-5 seconds hold - Clamshell  - 1 x daily - 7 x weekly - 3 sets - 10 reps - 2-3 seconds  hold  GOALS: Goals reviewed with patient? Yes  SHORT TERM GOALS: Target date: 10/16/2024    Pt will improve 5 x STS to less than or equal to 20 seconds without BUE support to demonstrate improved functional strength and transfer efficiency.   Baseline: 22.75s w/BUE support  Goal status: INITIAL  2.  Pt will improve gait velocity to at least 2.95 ft/s w/LRAD for improved gait efficiency and independence    Baseline: 2.81 ft/s w/rollator (11/11) Goal status: REVISED   3.  to be assessed and LTG updated  Baseline:  Goal status: MET  4.  FGA to be assessed and LTG updated  Baseline:  Goal status: INITIAL  5.  Pt will lift 20# weight from ground using proper body mechanics for improved posterior chain strength and stability w/lifting.  Baseline:  Goal status: INITIAL   LONG TERM GOALS: Target date: 10/30/2024    Pt will improve 5 x STS to less than or equal to 15 seconds without UE  support to demonstrate improved functional strength and transfer efficiency.   Baseline: 22.75s w/BUE support  Goal status: INITIAL  2.  Pt will improve gait velocity to at least 3.1 ft/s w/LRAD for improved gait efficiency and endurance  Baseline: 2.81 ft/s w/rollator (11/11) Goal status: REVISED   3.  Pt will ambulate greater than or equal to 1250 feet on with LARD and improved postural control for improved cardiovascular endurance and BLE strength.   Baseline: 1,013' w/rollator (11/11) Goal status: INITIAL  4.  FGA goal  Baseline:  Goal status: INITIAL  5.  Pt will lift 30# weight from ground using proper body mechanics for improved posterior chain strength and stability w/lifting.  Baseline:  Goal status: INITIAL  ASSESSMENT:  CLINICAL IMPRESSION: Emphasis of skilled PT session on assessing gait kinematics and endurance via and gait speed as well as modifying HEP. Pt demonstrates significant hip drop on LLE w/gait, resulting in decreased step length w/RLE and poor eccentric control. Revised pt's HEP to work on glute med/max strength and pt very challenged on L side. Continue POC.    OBJECTIVE IMPAIRMENTS: Abnormal gait, decreased activity tolerance, decreased balance, decreased endurance, decreased knowledge of condition, decreased mobility, difficulty walking, decreased strength, improper body mechanics, and postural dysfunction  ACTIVITY LIMITATIONS: carrying, lifting, bending, squatting, stairs, transfers, locomotion level, and caring for others  PARTICIPATION LIMITATIONS: cleaning, interpersonal relationship, driving, shopping, community activity, and yard work  PERSONAL FACTORS: 1 comorbidity: bilateral peripheral neuropathy are also affecting patient's functional outcome.   REHAB POTENTIAL: Good  CLINICAL DECISION MAKING: Evolving/moderate complexity  EVALUATION COMPLEXITY: Moderate  PLAN:  PT FREQUENCY: 1-2x/week  PT DURATION: 6 weeks  PLANNED  INTERVENTIONS: [id number]- PT Re-evaluation, 97750- Physical Performance Testing, 97110-Therapeutic exercises, 97530- Therapeutic activity, [id number]- Neuromuscular re-education, 97535- Self Care, [id number]- Manual therapy, [id number]- Gait training, [phone]- Orthotic Initial, [phone]- Orthotic/Prosthetic subsequent, [phone]- Aquatic Therapy, [phone]- Electrical stimulation (manual), [phone] (1-2 muscles), [id number] (3+ muscles)- Dry Needling, Patient/Family education, Balance training, Stair training, Joint mobilization, Spinal mobilization, Vestibular training, and DME instructions  PLAN FOR NEXT SESSION: FGA and update goals. Review HEP. Work on lifting, sit to stands w/reduced UE reliance, leg presses, functional hip strength, glute med strength    Martinez Boxx E Renay Crammer, PT, DPT 09/23/2024, 11:47 AM

## 2024-09-26 ENCOUNTER — Ambulatory Visit: Admitting: Physical Therapy

## 2024-09-26 DIAGNOSIS — R293 Abnormal posture: Secondary | ICD-10-CM

## 2024-09-26 DIAGNOSIS — M6281 Muscle weakness (generalized): Secondary | ICD-10-CM | POA: Diagnosis not present

## 2024-09-26 DIAGNOSIS — R2681 Unsteadiness on feet: Secondary | ICD-10-CM

## 2024-09-26 DIAGNOSIS — G20C Parkinsonism, unspecified: Secondary | ICD-10-CM | POA: Diagnosis not present

## 2024-09-26 DIAGNOSIS — R2689 Other abnormalities of gait and mobility: Secondary | ICD-10-CM

## 2024-09-26 DIAGNOSIS — R278 Other lack of coordination: Secondary | ICD-10-CM | POA: Diagnosis not present

## 2024-09-26 NOTE — Patient Instructions (Signed)
  Seated Deadlift  Start in a sturdy chair with a 10# kettlebell between your feet With good posture, reach for the weight between your feet (see above) Slowly lift the weight off the floor while keeping your back straight (do not pull with your arms) Slowly return the weight to the ground and repeat You may add a resistance band to the activity to increase resistance

## 2024-09-26 NOTE — Therapy (Signed)
 OUTPATIENT PHYSICAL THERAPY NEURO TREATMENT   Patient Name: [name] MRN: [id number] DOB:11-Oct-1946, 78 y.o., male Today's Date: 09/26/2024   PCP: Avelina Greig BRAVO, MD REFERRING PROVIDER: Avelina Greig BRAVO, MD  END OF SESSION:  PT End of Session - 09/26/24 1316     Visit Number 3    Number of Visits 13    Date for Recertification  11/06/24    Authorization Type [employer]    PT Start Time 1314    PT Stop Time 1359    PT Time Calculation (min) 45 min    Equipment Utilized During Treatment Gait belt    Activity Tolerance Patient tolerated treatment well    Behavior During Therapy WFL for tasks assessed/performed            Past Medical History:  Diagnosis Date   Agatston coronary artery calcium  score greater than 400    Coronary calcium  score of 533. This was 69th percentile for age,   Allergy 15 yrs. or more   mosquito or wasp bites bad reaction   Aortic atherosclerosis    Arrhythmia    Cataract 10 yrs. or more   slow bloom checkup every 6 mos.   Hyperlipidemia LDL goal <70    Neuromuscular disorder (HCC) 09/2018   Parkinsons   Parkinson disease (HCC)    Sleep apnea 02/21/2021 sleep study   have Resair machine   Past Surgical History:  Procedure Laterality Date   TONSILLECTOMY     WISDOM TOOTH EXTRACTION     Patient Active Problem List   Diagnosis Date Noted   Gait instability 06/23/2024   Scoliosis 06/23/2024   Agatston coronary artery calcium  score greater than 400 02/02/2022   Aortic atherosclerosis 02/01/2022   SVT (supraventricular tachycardia) 01/12/2022   OSA (obstructive sleep apnea) 01/12/2022   Parkinsonism (HCC) 11/25/2020   Edema of right lower leg 11/25/2020   Hyperlipidemia LDL goal <70 11/25/2020   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   Hearing loss 11/25/2020   Pityriasis rosea 11/25/2020    ONSET DATE: 09/11/2024 (referral)   REFERRING DIAG: G20.C (ICD-10-CM) - Parkinsonism, unspecified Parkinsonism type (HCC) R26.89  (ICD-10-CM) - Balance problems  THERAPY DIAG:  Abnormal posture  Muscle weakness (generalized)  Unsteadiness on feet  Other abnormalities of gait and mobility  Rationale for Evaluation and Treatment: Rehabilitation  SUBJECTIVE:                                                                                                                                                                                             SUBJECTIVE STATEMENT: Pt presents w/rollator. Denies acute changes or falls.  Has been working on his HEP, thinks his L hip is getting stronger    Pt accompanied by: Wife, [name] in lobby   PERTINENT HISTORY:  HLD, cellulitis, lymphedema, low back pain, hearing loss, (undiagnosed w/PD in 2025)  PAIN:  Are you having pain? No  PRECAUTIONS: Fall  RED FLAGS: None   WEIGHT BEARING RESTRICTIONS: No  FALLS: Has patient fallen in last 6 months? No  LIVING ENVIRONMENT: Lives with: lives with their spouse Lives in: House/apartment Stairs: Yes: External: 2-3 steps; on right going up, on left going up, and can reach both Has following equipment at home: Walker - 4 wheeled, shower chair, Ramped entry, and StrongArm cane  PLOF: Requires assistive device for independence  PATIENT GOALS: I want to be able to stand up and walk without this thing (rollator) and strengthen this thing (RLE)   OBJECTIVE:  Note: Objective measures were completed at Evaluation unless otherwise noted.  DIAGNOSTIC FINDINGS: MRI of C-spine from 06/05/24  IMPRESSION: 1. Cervical spondylosis as outlined within the body of the report. 2. No significant spinal canal stenosis. 3. Multilevel foraminal stenosis, greatest on the right at C5-C6 (moderate at this site). 4. No more than mild disc degeneration within the cervical spine. 5. Multilevel facet arthropathy, greatest on the left at C4-C5 (advanced at this site). 6. Facet ankylosis on the left at C3-C4. 7. Mild grade 1 anterolisthesis at  C4-C5 and C5-C6.  MRI of lumbar spine from 04/30/24 IMPRESSION: 1. 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 2. 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 3. Mild spinal canal stenosis at L2-3. 4. No spinal canal stenosis or neural foraminal narrowing in the thoracic spine.  MRI of T-spine 04/30/24 IMPRESSION: 1. 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 2. 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 3. Mild spinal canal stenosis at L2-3. 4. No spinal canal stenosis or neural foraminal narrowing in the thoracic spine.  NCV w/EMG from 06/30/24  NCV & EMG Findings: Extensive electrodiagnostic evaluation of bilateral lower limbs shows: Bilateral sural and superficial peroneal/fibular sensory responses are absent. Bilateral peroneal/fibular (EDB) and tibial (AH) motor responses are absent. Bilateral peroneal/fibular (TA) motor responses show reduced amplitude (L1.69, R1.67 mV). Bilateral H reflex latencies are absent. Chronic motor axon loss changes WITH active denervation changes are seen in bilateral tibialis anterior and bilateral medial head of gastrocnemius muscles.   Impression: This is an abnormal study. 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 The findings are at least moderate in degree electrically. No definitive electrodiagnostic evidence of a right or left lumbosacral (L3-S1) motor radiculopathy.  COGNITION: Overall cognitive status: Within functional limits for tasks assessed   SENSATION: Pt denies numbness/tingling in all extremities    EDEMA: Pt has chronic swelling of distal RLE     POSTURE: rounded shoulders, forward head, increased thoracic kyphosis, and flexed trunk   LOWER EXTREMITY ROM:     Active  Right Eval Left Eval  Hip flexion    Hip extension    Hip abduction    Hip adduction    Hip internal rotation    Hip external rotation    Knee flexion    Knee extension    Ankle dorsiflexion    Ankle plantarflexion  Ankle inversion    Ankle eversion     (Blank rows = not tested)  LOWER EXTREMITY MMT:  Tested in seated position   MMT Right Eval Left Eval  Hip flexion 5 5  Hip extension    Hip abduction 5 5  Hip adduction 5 5  Hip internal rotation    Hip external rotation    Knee flexion 5 5  Knee extension 5 5  Ankle dorsiflexion 5 5  Ankle plantarflexion    Ankle inversion    Ankle eversion    (Blank rows = not tested)  BED MOBILITY:  Not tested Independent per pt   TRANSFERS: Sit to stand: Modified independence  Assistive device utilized: None     Stand to sit: Modified independence  Assistive device utilized: None      RAMP:  Not tested  CURB:  Not tested  STAIRS: Not tested GAIT: Gait pattern: Bilateral genu valgus, excessive R ankle inversion/pronation, step through pattern, decreased step length- Left, decreased stance time- Right, decreased stride length, decreased hip/knee flexion- Right, decreased ankle dorsiflexion- Right, decreased ankle dorsiflexion- Left, lateral hip instability, lateral lean- Right, decreased trunk rotation, trunk flexed, and narrow BOS Distance walked: Various short clinic distances  Assistive device utilized: Walker - 4 wheeled Level of assistance: SBA Comments: Pt ambulates w/excessive thoracic kyphosis and forward flexed posture.    FUNCTIONAL TESTS:                                                                                                                                   TREATMENT:   Ther Act  Reviewed HEP provided at last session:  Supine glute bridges, x10 reps w/3-5s  isometric hold. Noted increased symmetry of pelvic position initially, but L hip drop w/fatigue. Progressed to B-stance glute bridges, x10 reps per side w/3-5s isometric hold which was much more difficult for pt on L side. Informed pt he can perform regular bridges or B-stance at home.  Sidelying clamshells, x10 reps per side. Noted continued difficulty on LLE w/decreased A/ROM noted. Pt denied feeling muscle fatigue on L side, just stated he could tell his leg was not moving well.  Educated pt on tactile cues to use to feel if glute med was working and pt able to feel this well on R side, but not L. However, placement of hand on glute med did reduce pt's posterior rotation compensation to perform movement on L side.  Seated deadlifts w/15# KB, x15 reps, for improved postural control and posterior chain strength. Min cues to move weight w/hip/spinal extensors rather than arms. Added blue resistance band over KB (anchored by pt's feet) to add resistance, x15 reps. Educated pt on performing it this way at home as he only has 10# KB. Added to HEP (see pt instructions).  At ballet bar, lateral resisted side stepping w/red theraband around ankles, x3 reps per side, for improved functional hip strength and single leg  stability. Mod verbal cues to avoid dragging feet on floor, especially LLE. Added to HEP (see bolded below). Pt challenged to get band off of ankles, but was able to perform independently.    PATIENT EDUCATION: Education details:  Updates to HEP Person educated: Patient Education method: Programmer, Multimedia, Demonstration, Actor cues, Verbal cues, and Handouts Education comprehension: verbalized understanding, returned demonstration, verbal cues required, tactile cues required, and needs further education  HOME EXERCISE PROGRAM: From previous POC: Access Code: [email or web address]   Access Code: [email or web address] URL: [email or web address] Date: 09/23/2024 Prepared by: Marlon Lelah Rennaker  Exercises - Wall  push up with strong push away  - 1 x daily - 7 x weekly - 3 sets - 10 reps - Dead Bug  - 1 x daily - 7 x weekly - 3 sets - 10 reps - Correct Standing Posture  - 1 x daily - 5 x weekly - 30 hold - Standing Balance with Eyes Closed on Foam  - 1 x daily - 5 x weekly - 3 sets - 30 hold - Standing march with overhead hold   - 1 x daily - 7 x weekly - 3 sets - 10 reps - Seated Ankle Alphabet  - 1 x daily - 7 x weekly - 3 sets - 10 reps - Ankle Inversion with Resistance  - 1 x daily - 7 x weekly - 3 sets - 10 reps - Ankle Eversion with Resistance  - 1 x daily - 7 x weekly - 3 sets - 10 reps - Ankle Dorsiflexion with Resistance  - 1 x daily - 7 x weekly - 3 sets - 10 reps - Ankle and Toe Plantarflexion with Resistance  - 1 x daily - 7 x weekly - 3 sets - 10 reps - Calf stretch  - 1 x daily - 7 x weekly - 3 sets - 10 reps - Seated Cervical Sidebending Stretch  - 1 x daily - 7 x weekly - 1 sets - 4 reps - 30 second hold - Single Arm Doorway Pec Stretch at 90 Degrees Abduction  - 1 x daily - 7 x weekly - 2-3 reps - 30-60 seconds  hold - Static Prone on Elbows  - 1 x daily - 7 x weekly - 2 sets - 10 reps - Supine Bridge  - 1 x daily - 7 x weekly - 3 sets - 10 reps - 3-5 seconds hold (can perform in B-stance)  - Clamshell  - 1 x daily - 7 x weekly - 3 sets - 10 reps - 2-3 seconds  hold - Seated Deadlifts (see pt instructions) - Side Stepping with Resistance at Ankles and Counter Support  - 1 x daily - 7 x weekly  GOALS: Goals reviewed with patient? Yes  SHORT TERM GOALS: Target date: 10/16/2024    Pt will improve 5 x STS to less than or equal to 20 seconds without BUE support to demonstrate improved functional strength and transfer efficiency.   Baseline: 22.75s w/BUE support  Goal status: INITIAL  2.  Pt will improve gait velocity to at least 2.95 ft/s w/LRAD for improved gait efficiency and independence    Baseline: 2.81 ft/s w/rollator (11/11) Goal status: REVISED   3.  to be assessed  and LTG updated  Baseline:  Goal status: MET  4.  FGA to be assessed and LTG updated  Baseline:  Goal status: INITIAL  5.  Pt will lift 20# weight from ground using proper body mechanics for improved posterior chain strength  and stability w/lifting.  Baseline:  Goal status: INITIAL   LONG TERM GOALS: Target date: 10/30/2024    Pt will improve 5 x STS to less than or equal to 15 seconds without UE support to demonstrate improved functional strength and transfer efficiency.   Baseline: 22.75s w/BUE support  Goal status: INITIAL  2.  Pt will improve gait velocity to at least 3.1 ft/s w/LRAD for improved gait efficiency and endurance   Baseline: 2.81 ft/s w/rollator (11/11) Goal status: REVISED   3.  Pt will ambulate greater than or equal to 1250 feet on with LARD and improved postural control for improved cardiovascular endurance and BLE strength.   Baseline: 1,013' w/rollator (11/11) Goal status: INITIAL  4.  FGA goal  Baseline:  Goal status: INITIAL  5.  Pt will lift 30# weight from ground using proper body mechanics for improved posterior chain strength and stability w/lifting.  Baseline:  Goal status: INITIAL  ASSESSMENT:  CLINICAL IMPRESSION: Emphasis of skilled PT session on reviewing HEP from previous session, improved posterior chain strength and functional hip strength. Pt continues to be limited by L glute med weakness and has impaired sensation in muscle as well. Educated pt on tactile cues to use w/exercise due to decreased sensation in hips. Pt challenged by banded walks, often dragging his LLE on the ground due to hip weakness. Continue POC.    OBJECTIVE IMPAIRMENTS: Abnormal gait, decreased activity tolerance, decreased balance, decreased endurance, decreased knowledge of condition, decreased mobility, difficulty walking, decreased strength, improper body mechanics, and postural dysfunction  ACTIVITY LIMITATIONS: carrying, lifting, bending, squatting,  stairs, transfers, locomotion level, and caring for others  PARTICIPATION LIMITATIONS: cleaning, interpersonal relationship, driving, shopping, community activity, and yard work  PERSONAL FACTORS: 1 comorbidity: bilateral peripheral neuropathy are also affecting patient's functional outcome.   REHAB POTENTIAL: Good  CLINICAL DECISION MAKING: Evolving/moderate complexity  EVALUATION COMPLEXITY: Moderate  PLAN:  PT FREQUENCY: 1-2x/week  PT DURATION: 6 weeks  PLANNED INTERVENTIONS: [id number]- PT Re-evaluation, 97750- Physical Performance Testing, 97110-Therapeutic exercises, 97530- Therapeutic activity, [id number]- Neuromuscular re-education, 97535- Self Care, [id number]- Manual therapy, [id number]- Gait training, [phone]- Orthotic Initial, [phone]- Orthotic/Prosthetic subsequent, [phone]- Aquatic Therapy, [phone]- Electrical stimulation (manual), [phone] (1-2 muscles), [id number] (3+ muscles)- Dry Needling, Patient/Family education, Balance training, Stair training, Joint mobilization, Spinal mobilization, Vestibular training, and DME instructions  PLAN FOR NEXT SESSION: FGA and update goals. Review HEP. Work on lifting, sit to stands w/reduced UE reliance, leg presses, functional hip strength, glute med strength, modified RDLs   Rylen Swindler E Joylene Wescott, PT, DPT 09/26/2024, 2:47 PM

## 2024-09-28 ENCOUNTER — Other Ambulatory Visit: Payer: Self-pay | Admitting: Cardiology

## 2024-09-28 DIAGNOSIS — I700 Atherosclerosis of aorta: Secondary | ICD-10-CM

## 2024-09-29 ENCOUNTER — Telehealth: Payer: Self-pay | Admitting: Neurology

## 2024-09-29 NOTE — Telephone Encounter (Addendum)
 Everything is in the chart. Will there be anything you would like for me to tell this patient

## 2024-09-29 NOTE — Telephone Encounter (Signed)
 Called and let patients wife know we did receive records and that Dr. Evonnie will see them on Tue the 25th

## 2024-09-29 NOTE — Telephone Encounter (Signed)
 Michelle(Wife) lvm wanting to verify  if Dr.Tat has received records from [facility] and [place].    PH: [phone]

## 2024-10-01 ENCOUNTER — Ambulatory Visit: Admitting: Physical Therapy

## 2024-10-01 DIAGNOSIS — R293 Abnormal posture: Secondary | ICD-10-CM | POA: Diagnosis not present

## 2024-10-01 DIAGNOSIS — R2689 Other abnormalities of gait and mobility: Secondary | ICD-10-CM | POA: Diagnosis not present

## 2024-10-01 DIAGNOSIS — M6281 Muscle weakness (generalized): Secondary | ICD-10-CM

## 2024-10-01 DIAGNOSIS — R2681 Unsteadiness on feet: Secondary | ICD-10-CM

## 2024-10-01 DIAGNOSIS — G20C Parkinsonism, unspecified: Secondary | ICD-10-CM | POA: Diagnosis not present

## 2024-10-01 DIAGNOSIS — R278 Other lack of coordination: Secondary | ICD-10-CM

## 2024-10-01 NOTE — Therapy (Signed)
 OUTPATIENT PHYSICAL THERAPY NEURO TREATMENT   Patient Name: [name] MRN: [id number] DOB:[birth year], 78 y.o., male Today's Date: 10/01/2024   PCP: Avelina Greig BRAVO, MD REFERRING PROVIDER: Avelina Greig BRAVO, MD  END OF SESSION:  PT End of Session - 10/01/24 1313     Visit Number 4    Number of Visits 13    Date for Recertification  11/06/24    Authorization Type [employer]    Authorization Time Period 13 PT visits 09/18/2024 - 12/17/2024    PT Start Time 1313    PT Stop Time 1400    PT Time Calculation (min) 47 min    Equipment Utilized During Treatment Gait belt    Activity Tolerance Patient tolerated treatment well    Behavior During Therapy WFL for tasks assessed/performed             Past Medical History:  Diagnosis Date   Agatston coronary artery calcium  score greater than 400    Coronary calcium  score of 533. This was 69th percentile for age,   Allergy 15 yrs. or more   mosquito or wasp bites bad reaction   Aortic atherosclerosis    Arrhythmia    Cataract 10 yrs. or more   slow bloom checkup every 6 mos.   Hyperlipidemia LDL goal <70    Neuromuscular disorder (HCC) 09/2018   Parkinsons   Parkinson disease (HCC)    Sleep apnea 02/21/2021 sleep study   have Resair machine   Past Surgical History:  Procedure Laterality Date   TONSILLECTOMY     WISDOM TOOTH EXTRACTION     Patient Active Problem List   Diagnosis Date Noted   Gait instability 06/23/2024   Scoliosis 06/23/2024   Agatston coronary artery calcium  score greater than 400 02/02/2022   Aortic atherosclerosis 02/01/2022   SVT (supraventricular tachycardia) 01/12/2022   OSA (obstructive sleep apnea) 01/12/2022   Parkinsonism (HCC) 11/25/2020   Edema of right lower leg 11/25/2020   Hyperlipidemia LDL goal <70 11/25/2020   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   Hearing loss 11/25/2020   Pityriasis rosea 11/25/2020    ONSET DATE: 09/11/2024 (referral)   REFERRING DIAG: G20.C (ICD-10-CM)  - Parkinsonism, unspecified Parkinsonism type (HCC) R26.89 (ICD-10-CM) - Balance problems  THERAPY DIAG:  Muscle weakness (generalized)  Unsteadiness on feet  Other abnormalities of gait and mobility  Abnormal posture  Other lack of coordination  Rationale for Evaluation and Treatment: Rehabilitation  SUBJECTIVE:  SUBJECTIVE STATEMENT: Pt presents w/rollator. Denies acute changes or falls. Has been working on his HEP, banded walks are very challenging.    Pt accompanied by: Wife, [name] in lobby   PERTINENT HISTORY:  HLD, cellulitis, lymphedema, low back pain, hearing loss, (undiagnosed w/PD in 2025)  PAIN:  Are you having pain? No  PRECAUTIONS: Fall  RED FLAGS: None   WEIGHT BEARING RESTRICTIONS: No  FALLS: Has patient fallen in last 6 months? No  LIVING ENVIRONMENT: Lives with: lives with their spouse Lives in: House/apartment Stairs: Yes: External: 2-3 steps; on right going up, on left going up, and can reach both Has following equipment at home: Walker - 4 wheeled, shower chair, Ramped entry, and StrongArm cane  PLOF: Requires assistive device for independence  PATIENT GOALS: I want to be able to stand up and walk without this thing (rollator) and strengthen this thing (RLE)   OBJECTIVE:  Note: Objective measures were completed at Evaluation unless otherwise noted.  DIAGNOSTIC FINDINGS: MRI of C-spine from 06/05/24  IMPRESSION: 1. Cervical spondylosis as outlined within the body of the report. 2. No significant spinal canal stenosis. 3. Multilevel foraminal stenosis, greatest on the right at C5-C6 (moderate at this site). 4. No more than mild disc degeneration within the cervical spine. 5. Multilevel facet arthropathy, greatest on the left at C4-C5 (advanced at this  site). 6. Facet ankylosis on the left at C3-C4. 7. Mild grade 1 anterolisthesis at C4-C5 and C5-C6.  MRI of lumbar spine from 04/30/24 IMPRESSION: 1. 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 2. 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 3. Mild spinal canal stenosis at L2-3. 4. No spinal canal stenosis or neural foraminal narrowing in the thoracic spine.  MRI of T-spine 04/30/24 IMPRESSION: 1. 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 2. 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 3. Mild spinal canal stenosis at L2-3. 4. No spinal canal stenosis or neural foraminal narrowing in the thoracic spine.  NCV w/EMG from 06/30/24  NCV & EMG Findings: Extensive electrodiagnostic evaluation of bilateral lower limbs shows: Bilateral sural and superficial peroneal/fibular sensory responses are absent. Bilateral peroneal/fibular (EDB) and tibial (AH) motor responses are absent. Bilateral peroneal/fibular (TA) motor responses show reduced amplitude (L1.69, R1.67 mV). Bilateral H reflex latencies are absent. Chronic motor axon loss changes WITH active denervation changes are seen in bilateral tibialis anterior and bilateral medial head of gastrocnemius muscles.   Impression: This is an abnormal study. 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 The findings are at least moderate in degree electrically. No definitive electrodiagnostic evidence of a right or left lumbosacral (L3-S1) motor radiculopathy.  COGNITION: Overall cognitive status: Within functional limits for tasks assessed   SENSATION: Pt denies  numbness/tingling in all extremities    EDEMA: Pt has chronic swelling of distal RLE    POSTURE: rounded shoulders, forward head, increased thoracic kyphosis, and flexed trunk   LOWER EXTREMITY ROM:     Active  Right Eval Left Eval  Hip flexion    Hip extension    Hip abduction    Hip adduction    Hip internal rotation    Hip external rotation    Knee flexion    Knee extension  Ankle dorsiflexion    Ankle plantarflexion    Ankle inversion    Ankle eversion     (Blank rows = not tested)  LOWER EXTREMITY MMT:  Tested in seated position   MMT Right Eval Left Eval  Hip flexion 5 5  Hip extension    Hip abduction 5 5  Hip adduction 5 5  Hip internal rotation    Hip external rotation    Knee flexion 5 5  Knee extension 5 5  Ankle dorsiflexion 5 5  Ankle plantarflexion    Ankle inversion    Ankle eversion    (Blank rows = not tested)  BED MOBILITY:  Not tested Independent per pt   TRANSFERS: Sit to stand: Modified independence  Assistive device utilized: None     Stand to sit: Modified independence  Assistive device utilized: None      RAMP:  Not tested  CURB:  Not tested  STAIRS: Not tested GAIT: Gait pattern: Bilateral genu valgus, excessive R ankle inversion/pronation, step through pattern, decreased step length- Left, decreased stance time- Right, decreased stride length, decreased hip/knee flexion- Right, decreased ankle dorsiflexion- Right, decreased ankle dorsiflexion- Left, lateral hip instability, lateral lean- Right, decreased trunk rotation, trunk flexed, and narrow BOS Distance walked: Various short clinic distances  Assistive device utilized: Walker - 4 wheeled Level of assistance: SBA Comments: Pt ambulates w/excessive thoracic kyphosis and forward flexed posture.    FUNCTIONAL TESTS:                                                                                                                                 TREATMENT:   Ther  Act/NMR Discussed upcoming visit w/Dr. Tat and reviewed EMG/MRI results. Educated pt on neuropathy and addressed all questions within PT scope of practice.  Modified pt's paper HEP to remove exercises from his binder that are struck out (see below) The following were performed for improved reciprocal coordination, postural control, functional hip strength and periscapular stability:  Quadruped bird dogs, x5 reps per side. Pt required mod verbal cues to perform reciprocal pattern rather than ipsilateral. Increased difficulty stabilizing on LLE > RLE. Added to HEP (see bolded below)  At wall, serratus activation w/foam roller, x10 reps. Provided tactile cues initially to facilitate use of serratus anterior rather than pt leaning into wall to perform. Attempted to progress movement by adding resistance band around forearms, but too difficult for pt at this time. Added to HEP (see bolded below) and cued pt to perform at home w/pool noodle or towel roll. Also educated pt on where to purchase foam roller if desired   PATIENT EDUCATION: Education details:  Updates to HEP Person educated: Patient Education method: Programmer, Multimedia, Demonstration, Actor cues, Verbal cues, and Handouts Education comprehension: verbalized understanding, returned demonstration, verbal cues required, tactile cues required, and needs further education  HOME EXERCISE PROGRAM:  Access Code: [email or web address] URL: [email or web address] Date: 09/23/2024 Prepared by: Marlon Lorine Iannaccone  Exercises -  Wall push up with strong push away  - 1 x daily - 7 x weekly - 3 sets - 10 reps - Dead Bug  - 1 x daily - 7 x weekly - 3 sets - 10 reps - Correct Standing Posture  - 1 x daily - 5 x weekly - 30 hold - Standing Balance with Eyes Closed on Foam  - 1 x daily - 5 x weekly - 3 sets - 30 hold - Standing march with overhead hold   - 1 x daily - 7 x weekly - 3 sets - 10 reps - Seated Ankle Alphabet  - 1 x daily - 7 x weekly - 3 sets - 10  reps - Ankle Inversion with Resistance  - 1 x daily - 7 x weekly - 3 sets - 10 reps - Ankle Eversion with Resistance  - 1 x daily - 7 x weekly - 3 sets - 10 reps - Ankle Dorsiflexion with Resistance  - 1 x daily - 7 x weekly - 3 sets - 10 reps - Ankle and Toe Plantarflexion with Resistance  - 1 x daily - 7 x weekly - 3 sets - 10 reps - Calf stretch  - 1 x daily - 7 x weekly - 3 sets - 10 reps - Seated Cervical Sidebending Stretch  - 1 x daily - 7 x weekly - 1 sets - 4 reps - 30 second hold - Single Arm Doorway Pec Stretch at 90 Degrees Abduction  - 1 x daily - 7 x weekly - 2-3 reps - 30-60 seconds  hold - Static Prone on Elbows  - 1 x daily - 7 x weekly - 2 sets - 10 reps - Supine Bridge  - 1 x daily - 7 x weekly - 3 sets - 10 reps - 3-5 seconds hold (can perform in B-stance)  - Clamshell  - 1 x daily - 7 x weekly - 3 sets - 10 reps - 2-3 seconds  hold - Seated Deadlifts (see pt instructions) - Side Stepping with Resistance at Ankles and Counter Support  - 1 x daily - 7 x weekly - Bird Dog  - 1 x daily - 7 x weekly - 3 sets - 10 reps - Serratus Activation at Wall with Foam Roll  - 1 x daily - 7 x weekly - 3 sets - 10 reps  GOALS: Goals reviewed with patient? Yes  SHORT TERM GOALS: Target date: 10/16/2024    Pt will improve 5 x STS to less than or equal to 20 seconds without BUE support to demonstrate improved functional strength and transfer efficiency.   Baseline: 22.75s w/BUE support  Goal status: INITIAL  2.  Pt will improve gait velocity to at least 2.95 ft/s w/LRAD for improved gait efficiency and independence    Baseline: 2.81 ft/s w/rollator (11/11) Goal status: REVISED   3.  to be assessed and LTG updated  Baseline:  Goal status: MET  4.  FGA to be assessed and LTG updated  Baseline:  Goal status: INITIAL  5.  Pt will lift 20# weight from ground using proper body mechanics for improved posterior chain strength and stability w/lifting.  Baseline:  Goal status:  INITIAL   LONG TERM GOALS: Target date: 10/30/2024    Pt will improve 5 x STS to less than or equal to 15 seconds without UE support to demonstrate improved functional strength and transfer efficiency.   Baseline: 22.75s w/BUE support  Goal status: INITIAL  2.  Pt will improve gait velocity to at least 3.1 ft/s w/LRAD for improved gait efficiency and endurance   Baseline: 2.81 ft/s w/rollator (11/11) Goal status: REVISED   3.  Pt will ambulate greater than or equal to 1250 feet on with LARD and improved postural control for improved cardiovascular endurance and BLE strength.   Baseline: 1,013' w/rollator (11/11) Goal status: INITIAL  4.  FGA goal  Baseline:  Goal status: INITIAL  5.  Pt will lift 30# weight from ground using proper body mechanics for improved posterior chain strength and stability w/lifting.  Baseline:  Goal status: INITIAL  ASSESSMENT:  CLINICAL IMPRESSION: Emphasis of skilled PT session on modifying HEP, pt education on neuropathy, periscapular stabilization and reciprocal coordination. Briefly reviewed pt's results of MRI/EMG and provided education on neuropathy. Pt to see Dr. Evonnie next week regarding this. Pt continues to demonstrate significant glute med weakness of LLE, likely contributing to gait instability on R side. Pt also challenged by serratus anterior activation due to periscapular weakness, so will continue to progress in PT.  Continue POC.    OBJECTIVE IMPAIRMENTS: Abnormal gait, decreased activity tolerance, decreased balance, decreased endurance, decreased knowledge of condition, decreased mobility, difficulty walking, decreased strength, improper body mechanics, and postural dysfunction  ACTIVITY LIMITATIONS: carrying, lifting, bending, squatting, stairs, transfers, locomotion level, and caring for others  PARTICIPATION LIMITATIONS: cleaning, interpersonal relationship, driving, shopping, community activity, and yard work  PERSONAL  FACTORS: 1 comorbidity: bilateral peripheral neuropathy are also affecting patient's functional outcome.   REHAB POTENTIAL: Good  CLINICAL DECISION MAKING: Evolving/moderate complexity  EVALUATION COMPLEXITY: Moderate  PLAN:  PT FREQUENCY: 1-2x/week  PT DURATION: 6 weeks  PLANNED INTERVENTIONS: [id number]- PT Re-evaluation, 97750- Physical Performance Testing, 97110-Therapeutic exercises, 97530- Therapeutic activity, [id number]- Neuromuscular re-education, 97535- Self Care, [id number]- Manual therapy, [id number]- Gait training, [phone]- Orthotic Initial, [phone]- Orthotic/Prosthetic subsequent, [phone]- Aquatic Therapy, [phone]- Electrical stimulation (manual), [phone] (1-2 muscles), [id number] (3+ muscles)- Dry Needling, Patient/Family education, Balance training, Stair training, Joint mobilization, Spinal mobilization, Vestibular training, and DME instructions  PLAN FOR NEXT SESSION: FGA and update goals. Review HEP. Work on lifting, sit to stands w/reduced UE reliance, leg presses, functional hip strength, glute med strength, modified RDLs, prone I/Y/T   Liv Rallis E Yvan Dority, PT, DPT 10/01/2024, 2:00 PM

## 2024-10-01 NOTE — Progress Notes (Signed)
 Assessment/Plan:  Assessment and Plan Assessment & Plan    1.  Parkinsonism             - Patient seen today off of pramipexole  and levodopa  and he does not meet criteria for Parkinson's disease.  We decided to leave him off of the medications today.  He still does have facial bradykinesia, so we will continue to watch him given history of abnormal DaTscan.             - Patient apparently had an abnormal DaTscan in September, 2019 in [place], [place] .  I have a copy of the report.  It does state that there was asymmetrically diminished putamen activity, but it does not tell me which putamen had the decreased uptake and in fact it says asymmetrically diminished in the putamen of both hemispheres.   - Discussed skin biopsy for alpha-synuclein, but both of us  decided to hold off on that and just see how he does wait and pursue that in the future, if needed.             - Patient has not seen decline of his diagnosed Parkinson's disease over the years.  His medications have not changed over the years and he has no sensation off on/off.  -will continue to monitor     2.  Sleep apnea, overall mild             - Patient had nocturnal polysomnogram in 2022 at [facility] neurology with AHI of 5.6, indicative of very mild sleep apnea.  Overall, the patient feels markedly better on CPAP and is faithful with it.  3.  Gait instability  - As previous, I do think that this is multifactorial.  I still think that orthopedic and degenerative changes are an issue.  He does have moderate to severe lumbar spinal stenosis.  He has scoliosis.  EMG demonstrates moderate peripheral neuropathy.  He and I discussed nature and pathophysiology of peripheral neuropathy.  He is not diabetic.  He does not drink alcohol regularly.  He and I discussed that there is no medication to help with the balance issues associated with peripheral neuropathy.    -send to PT  -labs today to look at reversible causes  today  -discussed upright walker and trial on that today   4.  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 Neurosurgery advised against surgical intervention. Gait instability likely multifactorial. - Continue current management without surgical intervention.  Subjective:  Discussed the use of AI scribe software for clinical note transcription with the patient, who gave verbal consent to proceed.  History of Present Illness    [name] was seen today in follow up for parkinsonism.  Patient is with his wife who supplements the history.  Patient is currently off of Parkinson's medications.  He has had no problem being off of the Parkinson's medications.  While he has met the criteria for bradykinesia, he has not met other criteria for Parkinson's disease, so we have decided to take a wait-and-see approach.  Gait instability has really been his biggest issue, but I thought that those were primarily orthopedic/degenerative in nature.  MRI of the lumbar spine confirmed degenerative changes with moderate to severe spinal canal stenosis at L4-L5.  He also have the known scoliosis.  He saw Dr. Sharps of neurosurgery and he noted that the patient had no radiating pain or  claudication, so was leery to do any surgical intervention for the lumbar spinal stenosis.  He had an EMG ordered by Dr. Claudene, completed at our office.  There was evidence of moderate peripheral neuropathy.  No falls.  He is exercising frequently.     ALLERGIES:   Allergies  Allergen Reactions   Bee Venom Anaphylaxis    CURRENT MEDICATIONS:  No outpatient medications have been marked as taking for the 10/07/24 encounter (Office Visit) with Courage Biglow, Asberry RAMAN, DO.     Objective:   PHYSICAL EXAMINATION:    VITALS:   Vitals:   10/07/24 0801  BP: 132/86  Pulse: 85  SpO2: 96%  Weight: 247 lb 6.4 oz (112.2 kg)      GEN:  The patient appears stated age and is in NAD. HEENT:  Normocephalic, atraumatic.  The mucous membranes are moist. The superficial temporal arteries are without ropiness or tenderness. CV:  RRR Lungs:  CTAB Neck/HEME:  There are no carotid bruits bilaterally.  Neurological examination:  Orientation: The patient is alert and oriented x3. Cranial nerves: There is good facial symmetry with facial hypomimia. The speech is fluent and clear. Soft palate rises symmetrically and there is no tongue deviation. Hearing is intact to conversational tone. Sensation: Sensation is intact to light touch throughout Motor: Strength is at least antigravity x4.  Movement examination: Tone: There is nl tone in the ue/le Abnormal movements: There is no rest tremor.  There is no postural tremor.  There is intention tremor. Coordination:  There is no decremation with RAM's, with any form of RAMS, including alternating supination and pronation of the forearm, hand opening and closing, finger taps, heel taps and toe taps.  He does have slow toe taps bilaterally, but no decremation. Gait and Station: The patient pushes off to arise.  Patient is flexed at the waist with an antalgic gait both with and without the walker.  He has a scoliotic curve.  He is unstable without the walker.  I have reviewed and interpreted the following labs independently    Chemistry      Component Value Date/Time   NA 140 04/25/2024 1144   K 4.1 04/25/2024 1144   CL 105 04/25/2024 1144   CO2 25 04/25/2024 1144   BUN 13 04/25/2024 1144   CREATININE 0.67 (L) 04/25/2024 1144      Component Value Date/Time   CALCIUM  9.2 04/25/2024 1144   ALKPHOS 30 (L) 10/18/2023 0724   AST 30 04/25/2024 1144   ALT 26 04/25/2024 1144   BILITOT 0.9 04/25/2024 1144       No results found for: WBC, HGB, HCT, MCV, PLT  No results found for: TSH  No results found for: VITAMINB12  Lab Results  Component Value Date    HGBA1C 5.3 10/26/2023    Total time spent on today's visit was 30 minutes, including both face-to-face time and nonface-to-face time.  Time included that spent on review of records (prior notes available to me/labs/imaging if pertinent), discussing treatment and goals, answering patient's questions and coordinating care.  Cc:  Avelina Greig BRAVO, MD

## 2024-10-03 ENCOUNTER — Ambulatory Visit: Admitting: Physical Therapy

## 2024-10-03 DIAGNOSIS — R2681 Unsteadiness on feet: Secondary | ICD-10-CM

## 2024-10-03 DIAGNOSIS — R2689 Other abnormalities of gait and mobility: Secondary | ICD-10-CM

## 2024-10-03 DIAGNOSIS — R293 Abnormal posture: Secondary | ICD-10-CM | POA: Diagnosis not present

## 2024-10-03 DIAGNOSIS — M6281 Muscle weakness (generalized): Secondary | ICD-10-CM

## 2024-10-03 DIAGNOSIS — G20C Parkinsonism, unspecified: Secondary | ICD-10-CM | POA: Diagnosis not present

## 2024-10-03 DIAGNOSIS — R278 Other lack of coordination: Secondary | ICD-10-CM | POA: Diagnosis not present

## 2024-10-03 NOTE — Therapy (Signed)
 OUTPATIENT PHYSICAL THERAPY NEURO TREATMENT   Patient Name: [name] MRN: [id number] DOB:[birth year], 78 y.o., male Today's Date: 10/03/2024   PCP: Avelina Greig BRAVO, MD REFERRING PROVIDER: Avelina Greig BRAVO, MD  END OF SESSION:  PT End of Session - 10/03/24 1319     Visit Number 5    Number of Visits 13    Date for Recertification  11/06/24    Authorization Type [employer]    Authorization Time Period 13 PT visits 09/18/2024 - 12/17/2024    PT Start Time 1317    PT Stop Time 1359    PT Time Calculation (min) 42 min    Equipment Utilized During Treatment --    Activity Tolerance Patient tolerated treatment well    Behavior During Therapy WFL for tasks assessed/performed              Past Medical History:  Diagnosis Date   Agatston coronary artery calcium  score greater than 400    Coronary calcium  score of 533. This was 69th percentile for age,   Allergy 15 yrs. or more   mosquito or wasp bites bad reaction   Aortic atherosclerosis    Arrhythmia    Cataract 10 yrs. or more   slow bloom checkup every 6 mos.   Hyperlipidemia LDL goal <70    Neuromuscular disorder (HCC) 09/2018   Parkinsons   Parkinson disease (HCC)    Sleep apnea 02/21/2021 sleep study   have Resair machine   Past Surgical History:  Procedure Laterality Date   TONSILLECTOMY     WISDOM TOOTH EXTRACTION     Patient Active Problem List   Diagnosis Date Noted   Gait instability 06/23/2024   Scoliosis 06/23/2024   Agatston coronary artery calcium  score greater than 400 02/02/2022   Aortic atherosclerosis 02/01/2022   SVT (supraventricular tachycardia) 01/12/2022   OSA (obstructive sleep apnea) 01/12/2022   Parkinsonism (HCC) 11/25/2020   Edema of right lower leg 11/25/2020   Hyperlipidemia LDL goal <70 11/25/2020   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   Hearing loss 11/25/2020   Pityriasis rosea 11/25/2020    ONSET DATE: 09/11/2024 (referral)   REFERRING DIAG: G20.C (ICD-10-CM) -  Parkinsonism, unspecified Parkinsonism type (HCC) R26.89 (ICD-10-CM) - Balance problems  THERAPY DIAG:  Muscle weakness (generalized)  Unsteadiness on feet  Other abnormalities of gait and mobility  Abnormal posture  Rationale for Evaluation and Treatment: Rehabilitation  SUBJECTIVE:  SUBJECTIVE STATEMENT: Pt presents w/rollator. Denies acute changes or falls. [name] dogs are really challenging at home, I was finally able to perform 4 in a row. Used a rain stick for his serratus activation and was too loud, so is going to order a foam roller.    Pt accompanied by: Wife, [name] in lobby   PERTINENT HISTORY:  HLD, cellulitis, lymphedema, low back pain, hearing loss, (undiagnosed w/PD in 2025)  PAIN:  Are you having pain? No  PRECAUTIONS: Fall  RED FLAGS: None   WEIGHT BEARING RESTRICTIONS: No  FALLS: Has patient fallen in last 6 months? No  LIVING ENVIRONMENT: Lives with: lives with their spouse Lives in: House/apartment Stairs: Yes: External: 2-3 steps; on right going up, on left going up, and can reach both Has following equipment at home: Walker - 4 wheeled, shower chair, Ramped entry, and StrongArm cane  PLOF: Requires assistive device for independence  PATIENT GOALS: I want to be able to stand up and walk without this thing (rollator) and strengthen this thing (RLE)   OBJECTIVE:  Note: Objective measures were completed at Evaluation unless otherwise noted.  DIAGNOSTIC FINDINGS: MRI of C-spine from 06/05/24  IMPRESSION: 1. Cervical spondylosis as outlined within the body of the report. 2. No significant spinal canal stenosis. 3. Multilevel foraminal stenosis, greatest on the right at C5-C6 (moderate at this site). 4. No more than mild disc degeneration within the cervical  spine. 5. Multilevel facet arthropathy, greatest on the left at C4-C5 (advanced at this site). 6. Facet ankylosis on the left at C3-C4. 7. Mild grade 1 anterolisthesis at C4-C5 and C5-C6.  MRI of lumbar spine from 04/30/24 IMPRESSION: 1. 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 2. 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 3. Mild spinal canal stenosis at L2-3. 4. No spinal canal stenosis or neural foraminal narrowing in the thoracic spine.  MRI of T-spine 04/30/24 IMPRESSION: 1. 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 2. 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 3. Mild spinal canal stenosis at L2-3. 4. No spinal canal stenosis or neural foraminal narrowing in the thoracic spine.  NCV w/EMG from 06/30/24  NCV & EMG Findings: Extensive electrodiagnostic evaluation of bilateral lower limbs shows: Bilateral sural and superficial peroneal/fibular sensory responses are absent. Bilateral peroneal/fibular (EDB) and tibial (AH) motor responses are absent. Bilateral peroneal/fibular (TA) motor responses show reduced amplitude (L1.69, R1.67 mV). Bilateral H reflex latencies are absent. Chronic motor axon loss changes WITH active denervation changes are seen in bilateral tibialis anterior and bilateral medial head of gastrocnemius muscles.   Impression: This is an abnormal study. 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 The findings are at least moderate in degree electrically. No definitive electrodiagnostic evidence of a right or left lumbosacral (L3-S1) motor radiculopathy.  COGNITION: Overall  cognitive status: Within functional limits for tasks assessed   SENSATION: Pt denies numbness/tingling in all extremities    EDEMA: Pt has chronic swelling of distal RLE    POSTURE: rounded shoulders, forward head, increased thoracic kyphosis, and flexed trunk   LOWER EXTREMITY ROM:     Active  Right Eval Left Eval  Hip flexion    Hip extension    Hip abduction    Hip  adduction    Hip internal rotation    Hip external rotation    Knee flexion    Knee extension    Ankle dorsiflexion    Ankle plantarflexion    Ankle inversion    Ankle eversion     (Blank rows = not tested)  LOWER EXTREMITY MMT:  Tested in seated position   MMT Right Eval Left Eval  Hip flexion 5 5  Hip extension    Hip abduction 5 5  Hip adduction 5 5  Hip internal rotation    Hip external rotation    Knee flexion 5 5  Knee extension 5 5  Ankle dorsiflexion 5 5  Ankle plantarflexion    Ankle inversion    Ankle eversion    (Blank rows = not tested)  BED MOBILITY:  Not tested Independent per pt   TRANSFERS: Sit to stand: Modified independence  Assistive device utilized: None     Stand to sit: Modified independence  Assistive device utilized: None      RAMP:  Not tested  CURB:  Not tested  STAIRS: Not tested GAIT: Gait pattern: Bilateral genu valgus, excessive R ankle inversion/pronation, step through pattern, decreased step length- Left, decreased stance time- Right, decreased stride length, decreased hip/knee flexion- Right, decreased ankle dorsiflexion- Right, decreased ankle dorsiflexion- Left, lateral hip instability, lateral lean- Right, decreased trunk rotation, trunk flexed, and narrow BOS Distance walked: Various short clinic distances  Assistive device utilized: Walker - 4 wheeled Level of assistance: SBA Comments: Pt ambulates w/excessive thoracic kyphosis and forward flexed posture.    FUNCTIONAL TESTS:                                                                                                                                  TREATMENT:   Ther Act The following were performed for improved core stability, periscapular strength and posterior chain strength:  Supine glute bridge w/10# OH hold, x10 reps w/5s isometric hold. Min cues for scapular retraction w/movement. Progressed to 10 reps w/10# OH pullover for added core and lat engagement.  Bent over rows w/20# KB, x10 reps per side w/CGA-min A due to lateral LOB to L side when performing w/RUE. Cued pt for wider BOS for improved stability  Sit to stands w/10# KB in goblet position, 2x7 reps. Min cues for improved eccentric control and to avoid premature knee extension. Pt very challenged by this, requiring CGA due to anterior instability.  Seated shoulder abduction w/resistance using green theraband, x10 reps w/hands in pronation and x10 reps in supination. Added to HEP (see bolded below)    PATIENT EDUCATION: Education details:  Updates to HEP Person educated: Patient Education method: Explanation, Demonstration, Tactile cues, Verbal cues, and Handouts Education comprehension: verbalized understanding, returned demonstration, verbal cues required, tactile cues required, and needs further education  HOME EXERCISE PROGRAM:  Access Code: [email or web address] URL: [email or web address] Date: 09/23/2024 Prepared by: Marlon Harman Langhans  Exercises - Wall  push up with strong push away  - 1 x daily - 7 x weekly - 3 sets - 10 reps - Dead Bug  - 1 x daily - 7 x weekly - 3 sets - 10 reps - Correct Standing Posture  - 1 x daily - 5 x weekly - 30 hold - Standing Balance with Eyes Closed on Foam  - 1 x daily - 5 x weekly - 3 sets - 30 hold - Standing march with overhead hold   - 1 x daily - 7 x weekly - 3 sets - 10 reps - Seated Ankle Alphabet  - 1 x daily - 7 x weekly - 3 sets - 10 reps - Ankle Inversion with Resistance  - 1 x daily - 7 x weekly - 3 sets - 10 reps - Ankle Eversion with Resistance  - 1 x daily -  7 x weekly - 3 sets - 10 reps - Ankle Dorsiflexion with Resistance  - 1 x daily - 7 x weekly - 3 sets - 10 reps - Ankle and Toe Plantarflexion with Resistance  - 1 x daily - 7 x weekly - 3 sets - 10 reps - Calf stretch  - 1 x daily - 7 x weekly - 3 sets - 10 reps - Seated Cervical Sidebending Stretch  - 1 x daily - 7 x weekly - 1 sets - 4 reps - 30 second hold - Single Arm Doorway Pec Stretch at 90 Degrees Abduction  - 1 x daily - 7 x weekly - 2-3 reps - 30-60 seconds  hold - Static Prone on Elbows  - 1 x daily - 7 x weekly - 2 sets - 10 reps - Supine Bridge  - 1 x daily - 7 x weekly - 3 sets - 10 reps - 3-5 seconds hold (can perform in B-stance)  - Clamshell  - 1 x daily - 7 x weekly - 3 sets - 10 reps - 2-3 seconds  hold - Seated Deadlifts (see pt instructions) - Side Stepping with Resistance at Ankles and Counter Support  - 1 x daily - 7 x weekly - Bird Dog  - 1 x daily - 7 x weekly - 3 sets - 10 reps - Serratus Activation at Wall with Foam Roll  - 1 x daily - 7 x weekly - 3 sets - 10 reps - Seated Shoulder Horizontal Abduction with Resistance  - 1 x daily - 7 x weekly - 1-2 sets - 10 reps  GOALS: Goals reviewed with patient? Yes  SHORT TERM GOALS: Target date: 10/16/2024    Pt will improve 5 x STS to less than or equal to 20 seconds without BUE support to demonstrate improved functional strength and transfer efficiency.   Baseline: 22.75s w/BUE support  Goal status: INITIAL  2.  Pt will improve gait velocity to at least 2.95 ft/s w/LRAD for improved gait efficiency and independence    Baseline: 2.81 ft/s w/rollator (11/11) Goal status: REVISED   3.  to be assessed and LTG updated  Baseline:  Goal status: MET  4.  FGA to be assessed and LTG updated  Baseline:  Goal status: INITIAL  5.  Pt will lift 20# weight from ground using proper body mechanics for improved posterior chain strength and stability w/lifting.  Baseline:  Goal status: INITIAL   LONG TERM GOALS:  Target date: 10/30/2024    Pt will improve 5 x STS to less than or equal to 15 seconds without UE  support to demonstrate improved functional strength and transfer efficiency.   Baseline: 22.75s w/BUE support  Goal status: INITIAL  2.  Pt will improve gait velocity to at least 3.1 ft/s w/LRAD for improved gait efficiency and endurance   Baseline: 2.81 ft/s w/rollator (11/11) Goal status: REVISED   3.  Pt will ambulate greater than or equal to 1250 feet on with LARD and improved postural control for improved cardiovascular endurance and BLE strength.   Baseline: 1,013' w/rollator (11/11) Goal status: INITIAL  4.  FGA goal  Baseline:  Goal status: INITIAL  5.  Pt will lift 30# weight from ground using proper body mechanics for improved posterior chain strength and stability w/lifting.  Baseline:  Goal status: INITIAL  ASSESSMENT:  CLINICAL IMPRESSION: Emphasis of skilled PT session on posterior chain strength, periscapular strength and postural control. Pt challenged by bent over rows, losing his balance to L side w/activity due to ongoing glute med weakness. Pt able to progress his glute bridges today to work on deep core and lat strength w/improved pelvic stability noted. Pt reports significant difficulty performing bird dogs at home due to proximal instability and difficulty maintaining reciprocal coordination. Will continue per POC.    OBJECTIVE IMPAIRMENTS: Abnormal gait, decreased activity tolerance, decreased balance, decreased endurance, decreased knowledge of condition, decreased mobility, difficulty walking, decreased strength, improper body mechanics, and postural dysfunction  ACTIVITY LIMITATIONS: carrying, lifting, bending, squatting, stairs, transfers, locomotion level, and caring for others  PARTICIPATION LIMITATIONS: cleaning, interpersonal relationship, driving, shopping, community activity, and yard work  PERSONAL FACTORS: 1 comorbidity: bilateral peripheral  neuropathy are also affecting patient's functional outcome.   REHAB POTENTIAL: Good  CLINICAL DECISION MAKING: Evolving/moderate complexity  EVALUATION COMPLEXITY: Moderate  PLAN:  PT FREQUENCY: 1-2x/week  PT DURATION: 6 weeks  PLANNED INTERVENTIONS: 97164- PT Re-evaluation, 97750- Physical Performance Testing, 97110-Therapeutic exercises, 97530- Therapeutic activity, [id number]- Neuromuscular re-education, 97535- Self Care, [id number]- Manual therapy, [id number]- Gait training, [phone]- Orthotic Initial, [phone]- Orthotic/Prosthetic subsequent, [phone]- Aquatic Therapy, [phone]- Electrical stimulation (manual), [phone] (1-2 muscles), [phone] (3+ muscles)- Dry Needling, Patient/Family education, Balance training, Stair training, Joint mobilization, Spinal mobilization, Vestibular training, and DME instructions  PLAN FOR NEXT SESSION: FGA and update goals. Review HEP. Work on lifting, sit to stands w/reduced UE reliance, leg presses, functional hip strength, glute med strength, modified RDLs, prone I/Y/T   Anthonymichael Munday E Kimari Lienhard, PT, DPT 10/03/2024, 2:00 PM

## 2024-10-07 ENCOUNTER — Ambulatory Visit: Admitting: Neurology

## 2024-10-07 ENCOUNTER — Ambulatory Visit: Admitting: Physical Therapy

## 2024-10-07 ENCOUNTER — Other Ambulatory Visit

## 2024-10-07 VITALS — BP 132/86 | HR 85 | Wt 247.4 lb

## 2024-10-07 DIAGNOSIS — R2681 Unsteadiness on feet: Secondary | ICD-10-CM

## 2024-10-07 DIAGNOSIS — R2689 Other abnormalities of gait and mobility: Secondary | ICD-10-CM

## 2024-10-07 DIAGNOSIS — G629 Polyneuropathy, unspecified: Secondary | ICD-10-CM

## 2024-10-07 DIAGNOSIS — R293 Abnormal posture: Secondary | ICD-10-CM

## 2024-10-07 DIAGNOSIS — R278 Other lack of coordination: Secondary | ICD-10-CM | POA: Diagnosis not present

## 2024-10-07 DIAGNOSIS — R739 Hyperglycemia, unspecified: Secondary | ICD-10-CM

## 2024-10-07 DIAGNOSIS — G20C Parkinsonism, unspecified: Secondary | ICD-10-CM | POA: Diagnosis not present

## 2024-10-07 DIAGNOSIS — M48061 Spinal stenosis, lumbar region without neurogenic claudication: Secondary | ICD-10-CM

## 2024-10-07 DIAGNOSIS — M6281 Muscle weakness (generalized): Secondary | ICD-10-CM

## 2024-10-07 DIAGNOSIS — R251 Tremor, unspecified: Secondary | ICD-10-CM

## 2024-10-07 NOTE — Patient Instructions (Addendum)
 Your provider has requested that you have labwork completed today. The lab is located on the Second floor at [address], within the [facility] office. When you get off the elevator, turn right and go in the [facility] [address]; the first brown door on the left.  Tell the ladies behind the desk that you are there for lab work. If you are not called within 15 minutes please check with the front desk.   Once you complete your labs you are free to go. You will receive a call or message via MyChart with your lab results.                      VISIT SUMMARY: Today, you came in for a follow-up appointment to discuss your gait instability and the results of your recent EMG study. You were accompanied by your wife. We reviewed your history of lumbar spine issues and the findings from your MRI and EMG studies. We also discussed your balance issues and the multifactorial causes contributing to your gait instability.  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 This is contributing to your gait instability. We have ordered a urine test to check for proteins that might indicate neuropathy and a blood test to check your vitamin B12 levels. You will also start physical therapy to help with balance retraining, and we have provided you with an upright walker to improve your posture and balance.  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 This, along with degenerative changes and scoliosis, is contributing to your gait instability. Neurosurgery has advised against surgical intervention, so we will continue with  your current management plan without surgery.  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 Although you have some symptoms, you do not meet the criteria for Parkinson's disease. We will continue to monitor you for any development of Parkinson's disease. In the meantime, please continue your physical activities and use assistive devices as needed.  INSTRUCTIONS: Please follow up with the lab for your urine and blood tests. Continue with your physical therapy sessions and use the upright walker for better balance and posture. We will monitor your condition and adjust the treatment plan as needed. If you notice any changes or have any concerns, please contact our office.                      Contains text generated by Abridge.                                 Contains text generated by Abridge.

## 2024-10-07 NOTE — Therapy (Signed)
 OUTPATIENT PHYSICAL THERAPY NEURO TREATMENT   Patient Name: [name] MRN: [id number] DOB:[birth year], 78 y.o., male Today's Date: 10/07/2024   PCP: Donald Greig BRAVO, MD REFERRING PROVIDER: Avelina Greig BRAVO, MD  END OF SESSION:  PT End of Session - 10/07/24 1109     Visit Number 6    Number of Visits 13    Date for Recertification  11/06/24    Authorization Type [employer]    Authorization Time Period 13 PT visits 09/18/2024 - 12/17/2024    PT Start Time 1107    PT Stop Time 1150    PT Time Calculation (min) 43 min    Activity Tolerance Patient tolerated treatment well    Behavior During Therapy WFL for tasks assessed/performed              Past Medical History:  Diagnosis Date   Agatston coronary artery calcium  score greater than 400    Coronary calcium  score of 533. This was 69th percentile for age,   Allergy 15 yrs. or more   mosquito or wasp bites bad reaction   Aortic atherosclerosis    Arrhythmia    Cataract 10 yrs. or more   slow bloom checkup every 6 mos.   Hyperlipidemia LDL goal <70    Neuromuscular disorder (HCC) 09/2018   Parkinsons   Parkinson disease (HCC)    Sleep apnea 02/21/2021 sleep study   have Resair machine   Past Surgical History:  Procedure Laterality Date   TONSILLECTOMY     WISDOM TOOTH EXTRACTION     Patient Active Problem List   Diagnosis Date Noted   Gait instability 06/23/2024   Scoliosis 06/23/2024   Agatston coronary artery calcium  score greater than 400 02/02/2022   Aortic atherosclerosis 02/01/2022   SVT (supraventricular tachycardia) 01/12/2022   OSA (obstructive sleep apnea) 01/12/2022   Parkinsonism (HCC) 11/25/2020   Edema of right lower leg 11/25/2020   Hyperlipidemia LDL goal <70 11/25/2020   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   Hearing loss 11/25/2020   Pityriasis rosea 11/25/2020    ONSET DATE: 09/11/2024 (referral)   REFERRING DIAG: G20.C (ICD-10-CM) - Parkinsonism, unspecified Parkinsonism type  (HCC) R26.89 (ICD-10-CM) - Balance problems  THERAPY DIAG:  Muscle weakness (generalized)  Unsteadiness on feet  Other abnormalities of gait and mobility  Abnormal posture  Rationale for Evaluation and Treatment: Rehabilitation  SUBJECTIVE:                                                                                                                                                                                             SUBJECTIVE STATEMENT: Pt presents w/rollator.  States he saw Dr. Evonnie, went well. Trialed an Up-walker but did not feel good. No falls or pain today.    Pt accompanied by: Wife, [name] in lobby   PERTINENT HISTORY:  HLD, cellulitis, lymphedema, low back pain, hearing loss, (undiagnosed w/PD in 2025)  PAIN:  Are you having pain? No  PRECAUTIONS: Fall  RED FLAGS: None   WEIGHT BEARING RESTRICTIONS: No  FALLS: Has patient fallen in last 6 months? No  LIVING ENVIRONMENT: Lives with: lives with their spouse Lives in: House/apartment Stairs: Yes: External: 2-3 steps; on right going up, on left going up, and can reach both Has following equipment at home: Walker - 4 wheeled, shower chair, Ramped entry, and StrongArm cane  PLOF: Requires assistive device for independence  PATIENT GOALS: I want to be able to stand up and walk without this thing (rollator) and strengthen this thing (RLE)   OBJECTIVE:  Note: Objective measures were completed at Evaluation unless otherwise noted.  DIAGNOSTIC FINDINGS: MRI of C-spine from 06/05/24  IMPRESSION: 1. Cervical spondylosis as outlined within the body of the report. 2. No significant spinal canal stenosis. 3. Multilevel foraminal stenosis, greatest on the right at C5-C6 (moderate at this site). 4. No more than mild disc degeneration within the cervical spine. 5. Multilevel facet arthropathy, greatest on the left at C4-C5 (advanced at this site). 6. Facet ankylosis on the left at C3-C4. 7. Mild grade 1  anterolisthesis at C4-C5 and C5-C6.  MRI of lumbar spine from 04/30/24 IMPRESSION: 1. 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 2. 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 3. Mild spinal canal stenosis at L2-3. 4. No spinal canal stenosis or neural foraminal narrowing in the thoracic spine.  MRI of T-spine 04/30/24 IMPRESSION: 1. 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 2. 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 3. Mild spinal canal stenosis at L2-3. 4. No spinal canal stenosis or neural foraminal narrowing in the thoracic spine.  NCV w/EMG from 06/30/24  NCV & EMG Findings: Extensive electrodiagnostic evaluation of bilateral lower limbs shows: Bilateral sural and superficial peroneal/fibular sensory responses are absent. Bilateral peroneal/fibular (EDB) and tibial (AH) motor responses are absent. Bilateral peroneal/fibular (TA) motor responses show reduced amplitude (L1.69, R1.67 mV). Bilateral H reflex latencies are absent. Chronic motor axon loss changes WITH active denervation changes are seen in bilateral tibialis anterior and bilateral medial head of gastrocnemius muscles.   Impression: This is an abnormal study. 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 The findings are at least moderate in degree electrically. No definitive electrodiagnostic evidence of a right or left lumbosacral (L3-S1) motor radiculopathy.  COGNITION: Overall cognitive status: Within functional limits for tasks assessed   SENSATION: Pt denies numbness/tingling in all extremities    EDEMA: Pt has chronic  swelling of distal RLE    POSTURE: rounded shoulders, forward head, increased thoracic kyphosis, and flexed trunk   LOWER EXTREMITY ROM:     Active  Right Eval Left Eval  Hip flexion    Hip extension    Hip abduction    Hip adduction    Hip internal rotation    Hip external rotation    Knee flexion    Knee extension    Ankle  dorsiflexion    Ankle plantarflexion    Ankle inversion    Ankle eversion     (Blank rows = not tested)  LOWER EXTREMITY MMT:  Tested in seated position   MMT Right Eval Left Eval  Hip flexion 5 5  Hip extension    Hip abduction 5 5  Hip adduction 5 5  Hip internal rotation    Hip external rotation    Knee flexion 5 5  Knee extension 5 5  Ankle dorsiflexion 5 5  Ankle plantarflexion    Ankle inversion    Ankle eversion    (Blank rows = not tested)  BED MOBILITY:  Not tested Independent per pt   TRANSFERS: Sit to stand: Modified independence  Assistive device utilized: None     Stand to sit: Modified independence  Assistive device utilized: None      RAMP:  Not tested  CURB:  Not tested  STAIRS: Not tested GAIT: Gait pattern: Bilateral genu valgus, excessive R ankle inversion/pronation, step through pattern, decreased step length- Left, decreased stance time- Right, decreased stride length, decreased hip/knee flexion- Right, decreased ankle dorsiflexion- Right, decreased ankle dorsiflexion- Left, lateral hip instability, lateral lean- Right, decreased trunk rotation, trunk flexed, and narrow BOS Distance walked: Various short clinic distances  Assistive device utilized: Walker - 4 wheeled Level of assistance: SBA Comments: Pt ambulates w/excessive thoracic kyphosis and forward flexed posture.    FUNCTIONAL TESTS:                                                                                                                                 TREATMENT:   Ther Act The following were performed for improved posterior chain  strength, periscapular strength and proximal stability:  Reviewed supine glute bridges w/10# OH pull over per pt request, x10 reps. Pt performed well w/no cues required.  Prone T's w/5s isometric hold, x10 reps. Added to HEP (see bolded below)  Prone Y's w/5s isometric hold, x10 reps. Very difficult for pt, added to HEP (see bolded below)  Prone A's w/5s isometric hold, x10 reps. Added to HEP (see bolded below)  Tall kneel mini squats w/emphasis on upright posture and facilitation of hip hinge, x15 reps w/no UE support. Cued pt to visually fixate on object at clavicle height to reduce forward flexed posture, which worked well for pt.  Tall kneel fire hydrants w/BUE support on bench, x10 reps per side. Increased difficulty stabilizing on LLE.    PATIENT EDUCATION: Education details:  Updates to HEP Person educated: Patient Education method: Programmer, Multimedia, Demonstration, Actor cues, Verbal cues, and Handouts Education comprehension: verbalized understanding, returned demonstration, verbal cues required, tactile cues required, and needs further education  HOME EXERCISE PROGRAM:  Access Code: [email or web address] URL: [email or web address] Date: 09/23/2024 Prepared by: Marlon Chanele Douglas  Exercises - Wall push up with strong push away  - 1 x daily - 7 x weekly - 3 sets - 10 reps - Dead  Bug  - 1 x daily - 7 x weekly - 3 sets - 10 reps - Correct Standing Posture  - 1 x daily - 5 x weekly - 30 hold - Standing Balance with Eyes Closed on Foam  - 1 x daily - 5 x weekly - 3 sets - 30 hold - Standing march with overhead hold   - 1 x daily - 7 x weekly - 3 sets - 10 reps - Seated Ankle Alphabet  - 1 x daily - 7 x weekly - 3 sets - 10 reps - Ankle Inversion with Resistance  - 1 x daily - 7 x weekly - 3 sets - 10 reps - Ankle Eversion with Resistance  - 1 x daily - 7 x weekly - 3 sets - 10 reps - Ankle Dorsiflexion with Resistance  - 1 x daily - 7 x weekly - 3 sets - 10 reps - Ankle and Toe  Plantarflexion with Resistance  - 1 x daily - 7 x weekly - 3 sets - 10 reps - Calf stretch  - 1 x daily - 7 x weekly - 3 sets - 10 reps - Seated Cervical Sidebending Stretch  - 1 x daily - 7 x weekly - 1 sets - 4 reps - 30 second hold - Single Arm Doorway Pec Stretch at 90 Degrees Abduction  - 1 x daily - 7 x weekly - 2-3 reps - 30-60 seconds  hold - Static Prone on Elbows  - 1 x daily - 7 x weekly - 2 sets - 10 reps - Supine Bridge  - 1 x daily - 7 x weekly - 3 sets - 10 reps - 3-5 seconds hold (can perform in B-stance)  - Clamshell  - 1 x daily - 7 x weekly - 3 sets - 10 reps - 2-3 seconds  hold - Seated Deadlifts (see pt instructions) - Side Stepping with Resistance at Ankles and Counter Support  - 1 x daily - 7 x weekly - Bird Dog  - 1 x daily - 7 x weekly - 3 sets - 10 reps - Serratus Activation at Wall with Foam Roll  - 1 x daily - 7 x weekly - 3 sets - 10 reps - Seated Shoulder Horizontal Abduction with Resistance  - 1 x daily - 7 x weekly - 1-2 sets - 10 reps - Prone Scapular Retraction Y  - 1 x daily - 7 x weekly - 3 sets - 10 reps - Prone Scapular Retraction Arms at Side  - 1 x daily - 7 x weekly - 3 sets - 10 reps - Prone Shoulder Extension  - 1 x daily - 7 x weekly - 3 sets - 10 reps  GOALS: Goals reviewed with patient? Yes  SHORT TERM GOALS: Target date: 10/16/2024    Pt will improve 5 x STS to less than or equal to 20 seconds without BUE support to demonstrate improved functional strength and transfer efficiency.   Baseline: 22.75s w/BUE support  Goal status: INITIAL  2.  Pt will improve gait velocity to at least 2.95 ft/s w/LRAD for improved gait efficiency and independence    Baseline: 2.81 ft/s w/rollator (11/11) Goal status: REVISED   3.  to be assessed and LTG updated  Baseline:  Goal status: MET  4.  FGA to be assessed and LTG updated  Baseline:  Goal status: INITIAL  5.  Pt will lift 20# weight from ground using proper body mechanics for improved  posterior  chain strength and stability w/lifting.  Baseline:  Goal status: INITIAL   LONG TERM GOALS: Target date: 10/30/2024    Pt will improve 5 x STS to less than or equal to 15 seconds without UE support to demonstrate improved functional strength and transfer efficiency.   Baseline: 22.75s w/BUE support  Goal status: INITIAL  2.  Pt will improve gait velocity to at least 3.1 ft/s w/LRAD for improved gait efficiency and endurance   Baseline: 2.81 ft/s w/rollator (11/11) Goal status: REVISED   3.  Pt will ambulate greater than or equal to 1250 feet on with LARD and improved postural control for improved cardiovascular endurance and BLE strength.   Baseline: 1,013' w/rollator (11/11) Goal status: INITIAL  4.  FGA goal  Baseline:  Goal status: INITIAL  5.  Pt will lift 30# weight from ground using proper body mechanics for improved posterior chain strength and stability w/lifting.  Baseline:  Goal status: INITIAL  ASSESSMENT:  CLINICAL IMPRESSION: Emphasis of skilled PT session on periscapular control, proximal stability and posterior chain strength. Pt very challenged by prone scapular retraction, noting inability to lift hands off mat table due to weakness. Pt also challenged by tall kneel activities due to weakened spinal extensors and proximal instability. Pt will continue to benefit from skilled PT to improve postural control for improved stability w/gait and reduced fall risk. Will continue per POC.    OBJECTIVE IMPAIRMENTS: Abnormal gait, decreased activity tolerance, decreased balance, decreased endurance, decreased knowledge of condition, decreased mobility, difficulty walking, decreased strength, improper body mechanics, and postural dysfunction  ACTIVITY LIMITATIONS: carrying, lifting, bending, squatting, stairs, transfers, locomotion level, and caring for others  PARTICIPATION LIMITATIONS: cleaning, interpersonal relationship, driving, shopping, community  activity, and yard work  PERSONAL FACTORS: 1 comorbidity: bilateral peripheral neuropathy are also affecting patient's functional outcome.   REHAB POTENTIAL: Good  CLINICAL DECISION MAKING: Evolving/moderate complexity  EVALUATION COMPLEXITY: Moderate  PLAN:  PT FREQUENCY: 1-2x/week  PT DURATION: 6 weeks  PLANNED INTERVENTIONS: [id number]- PT Re-evaluation, 97750- Physical Performance Testing, 97110-Therapeutic exercises, 97530- Therapeutic activity, [id number]- Neuromuscular re-education, 97535- Self Care, [id number]- Manual therapy, [id number]- Gait training, [phone]- Orthotic Initial, [phone]- Orthotic/Prosthetic subsequent, [phone]- Aquatic Therapy, [phone]- Electrical stimulation (manual), [phone] (1-2 muscles), [id number] (3+ muscles)- Dry Needling, Patient/Family education, Balance training, Stair training, Joint mobilization, Spinal mobilization, Vestibular training, and DME instructions  PLAN FOR NEXT SESSION: FGA and update goals. Review HEP. Work on lifting, sit to stands w/reduced UE reliance, leg presses, functional hip strength, glute med strength, modified RDLs, prone I/Y/T   Marlon Bradley Xylah Early, PT, DPT 10/07/2024, 11:57 AM

## 2024-10-08 ENCOUNTER — Ambulatory Visit: Payer: Self-pay | Admitting: Neurology

## 2024-10-10 LAB — IMMUNOFIXATION INTE

## 2024-10-10 LAB — PROTEIN ELECTROPHORESIS,RANDOM URN
Creatinine, Urine: 65 mg/dL (ref 20–320)
Protein/Creat Ratio: 77 mg/g{creat} (ref 25–148)
Protein/Creatinine Ratio: 0.077 mg/mg{creat} (ref 0.025–0.148)
Total Protein, Urine: 5 mg/dL (ref 5–25)

## 2024-10-13 ENCOUNTER — Ambulatory Visit: Attending: Family Medicine | Admitting: Physical Therapy

## 2024-10-13 DIAGNOSIS — R293 Abnormal posture: Secondary | ICD-10-CM | POA: Diagnosis present

## 2024-10-13 DIAGNOSIS — R2681 Unsteadiness on feet: Secondary | ICD-10-CM | POA: Diagnosis present

## 2024-10-13 DIAGNOSIS — R2689 Other abnormalities of gait and mobility: Secondary | ICD-10-CM | POA: Insufficient documentation

## 2024-10-13 DIAGNOSIS — M6281 Muscle weakness (generalized): Secondary | ICD-10-CM | POA: Insufficient documentation

## 2024-10-13 NOTE — Patient Instructions (Signed)
   Perform with two kettlebells on a 14-16 inch box/surface

## 2024-10-13 NOTE — Therapy (Signed)
 OUTPATIENT PHYSICAL THERAPY NEURO TREATMENT   Patient Name: [name] MRN: [id number] DOB:October 02, 1946, 78 y.o., male Today's Date: 10/13/2024   PCP: Avelina Greig BRAVO, MD REFERRING PROVIDER: Avelina Greig BRAVO, MD  END OF SESSION:  PT End of Session - 10/13/24 1319     Visit Number 7    Number of Visits 13    Date for Recertification  11/06/24    Authorization Type [employer]    Authorization Time Period 13 PT visits 09/18/2024 - 12/17/2024    PT Start Time 1318    PT Stop Time 1403    PT Time Calculation (min) 45 min    Equipment Utilized During Treatment Gait belt    Activity Tolerance Patient tolerated treatment well    Behavior During Therapy WFL for tasks assessed/performed              Past Medical History:  Diagnosis Date   Agatston coronary artery calcium  score greater than 400    Coronary calcium  score of 533. This was 69th percentile for age,   Allergy 15 yrs. or more   mosquito or wasp bites bad reaction   Aortic atherosclerosis    Arrhythmia    Cataract 10 yrs. or more   slow bloom checkup every 6 mos.   Hyperlipidemia LDL goal <70    Neuromuscular disorder (HCC) 09/2018   Parkinsons   Parkinson disease (HCC)    Sleep apnea 02/21/2021 sleep study   have Resair machine   Past Surgical History:  Procedure Laterality Date   TONSILLECTOMY     WISDOM TOOTH EXTRACTION     Patient Active Problem List   Diagnosis Date Noted   Gait instability 06/23/2024   Scoliosis 06/23/2024   Agatston coronary artery calcium  score greater than 400 02/02/2022   Aortic atherosclerosis 02/01/2022   SVT (supraventricular tachycardia) 01/12/2022   OSA (obstructive sleep apnea) 01/12/2022   Parkinsonism (HCC) 11/25/2020   Edema of right lower leg 11/25/2020   Hyperlipidemia LDL goal <70 11/25/2020   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 States he is doing well. HEP is still challenging but I am doing it. No falls or pain.    Pt accompanied by: Wife, [name] in lobby   PERTINENT HISTORY:  HLD, cellulitis, lymphedema, low back pain, hearing loss, (undiagnosed w/PD in 2025)  PAIN:  Are you having pain? No  PRECAUTIONS: Fall  RED FLAGS: None   WEIGHT BEARING RESTRICTIONS: No  FALLS: Has patient fallen in last 6 months? No  LIVING ENVIRONMENT: Lives with: lives with their spouse Lives in: House/apartment Stairs: Yes: External: 2-3 steps; on right going up, on left going up, and can reach both Has following equipment at home: Walker - 4 wheeled, shower chair, Ramped entry, and StrongArm cane  PLOF: Requires assistive device for independence  PATIENT GOALS: I want to be able to stand up and walk without this thing (rollator) and strengthen this thing (RLE)   OBJECTIVE:  Note: Objective measures were completed at Evaluation unless otherwise noted.  DIAGNOSTIC FINDINGS: MRI of C-spine from 06/05/24  IMPRESSION: 1. Cervical spondylosis as outlined within the body of the report. 2. No significant spinal canal stenosis. 3. Multilevel foraminal stenosis, greatest on the right at C5-C6 (moderate at this site). 4. No more than mild disc degeneration within the cervical spine. 5. Multilevel facet arthropathy, greatest on the left at C4-C5 (advanced at this site). 6. Facet  ankylosis on the left at C3-C4. 7. Mild grade 1 anterolisthesis at C4-C5 and C5-C6.  MRI of lumbar spine from 04/30/24 IMPRESSION: 1. 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 2. 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 3. Mild spinal canal stenosis at L2-3. 4. No spinal canal stenosis or neural foraminal narrowing in the thoracic spine.  MRI of T-spine 04/30/24 IMPRESSION: 1. 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 2. 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 3. Mild spinal canal stenosis at L2-3. 4. No spinal canal stenosis or neural foraminal narrowing in the thoracic spine.  NCV w/EMG from 06/30/24  NCV & EMG Findings: Extensive electrodiagnostic evaluation of bilateral lower limbs shows: Bilateral sural and superficial peroneal/fibular sensory responses are absent. Bilateral peroneal/fibular (EDB) and tibial (AH) motor responses are absent. Bilateral peroneal/fibular (TA) motor responses show reduced amplitude (L1.69, R1.67 mV). Bilateral H reflex latencies are absent. Chronic motor axon loss changes WITH active denervation changes are seen in bilateral tibialis anterior and bilateral medial head of gastrocnemius muscles.   Impression: This is an abnormal study. 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 The findings are at least moderate in degree electrically. No definitive electrodiagnostic evidence of a right or left lumbosacral (L3-S1) motor radiculopathy.  COGNITION: Overall cognitive status: Within functional limits for tasks assessed   SENSATION: Pt denies numbness/tingling  in all extremities    EDEMA: Pt has chronic swelling of distal RLE    POSTURE: rounded shoulders, forward head, increased thoracic kyphosis, and flexed trunk   LOWER EXTREMITY ROM:     Active  Right Eval Left Eval  Hip flexion    Hip extension    Hip abduction    Hip adduction    Hip internal rotation    Hip external rotation    Knee flexion    Knee extension  Ankle dorsiflexion    Ankle plantarflexion    Ankle inversion    Ankle eversion     (Blank rows = not tested)  LOWER EXTREMITY MMT:  Tested in seated position   MMT Right Eval Left Eval  Hip flexion 5 5  Hip extension    Hip abduction 5 5  Hip adduction 5 5  Hip internal rotation    Hip external rotation    Knee flexion 5 5  Knee extension 5 5  Ankle dorsiflexion 5 5  Ankle plantarflexion    Ankle inversion    Ankle eversion    (Blank rows = not tested)  BED MOBILITY:  Not tested Independent per pt   TRANSFERS: Sit to stand: Modified independence  Assistive device utilized: None     Stand to sit: Modified independence  Assistive device utilized: None      RAMP:  Not tested  CURB:  Not tested  STAIRS: Not tested GAIT: Gait pattern: Bilateral genu valgus, excessive R ankle inversion/pronation, step through pattern, decreased step length- Left, decreased stance time- Right, decreased stride length, decreased hip/knee flexion- Right, decreased ankle dorsiflexion- Right, decreased ankle dorsiflexion- Left, lateral hip instability, lateral lean- Right, decreased trunk rotation, trunk flexed, and narrow BOS Distance walked: Various short clinic distances  Assistive device utilized: Walker - 4 wheeled Level of assistance: SBA Comments: Pt ambulates w/excessive thoracic kyphosis and forward flexed posture.    FUNCTIONAL TESTS:                                                                                                                                 TREATMENT:   Ther Act/NMR The following  were performed for improved postural control, posterior chain strength, facilitation of hip hinge and stability w/anterior weight shifting:  In hallway, suitcase carries w/20# KB, 10' x3 reps w/CGA. Increased difficulty holding on R side. Added to HEP (see bolded below)  Modified deadlifts to 14 box w/15# KB, x15 reps. Mod multimodal cues for proper weight shift and neutral spine position throughout. Min cues to maintain weight in heels of feet and to fixate gaze on object in front of him rather than look down at ground. Min cues for fast hip extension to work on immediate standing balance and upright posture.  Progressed to deadlifts from ground using 15# KB, x15 reps w/CGA. Continued to cue pt to perform fast hip extension for anticipatory balance strategies.  Standing gorilla rows from 14# box using 15# and 20# KB, 2x12 reps per side (alt KB each set). Pt performed well w/no LOB noted, but increased difficulty maintaining hip hinge position. Added to HEP via word document (see pt instructions).    PATIENT EDUCATION: Education details:  Updates to HEP Person educated: Patient Education method: Programmer, Multimedia, Demonstration, Actor cues, Verbal cues, and Handouts Education comprehension: verbalized understanding, returned demonstration, verbal cues required, tactile cues required, and needs further education  HOME EXERCISE PROGRAM:  Access Code:  [email or web address] URL: [email or web address] Date: 09/23/2024 Prepared by: Marlon Wynetta Seith  Exercises - Wall push up with strong push away  - 1 x daily - 7 x weekly - 3 sets - 10 reps - Dead Bug  - 1 x daily - 7 x weekly - 3 sets - 10 reps - Correct Standing Posture  - 1 x daily - 5 x weekly - 30 hold - Standing Balance with Eyes Closed on Foam  - 1 x daily - 5 x weekly - 3 sets - 30 hold - Standing march with overhead hold   - 1 x daily - 7 x weekly - 3 sets - 10 reps - Seated Ankle Alphabet  - 1 x daily - 7 x weekly - 3 sets - 10 reps - Ankle  Inversion with Resistance  - 1 x daily - 7 x weekly - 3 sets - 10 reps - Ankle Eversion with Resistance  - 1 x daily - 7 x weekly - 3 sets - 10 reps - Ankle Dorsiflexion with Resistance  - 1 x daily - 7 x weekly - 3 sets - 10 reps - Ankle and Toe Plantarflexion with Resistance  - 1 x daily - 7 x weekly - 3 sets - 10 reps - Calf stretch  - 1 x daily - 7 x weekly - 3 sets - 10 reps - Seated Cervical Sidebending Stretch  - 1 x daily - 7 x weekly - 1 sets - 4 reps - 30 second hold - Single Arm Doorway Pec Stretch at 90 Degrees Abduction  - 1 x daily - 7 x weekly - 2-3 reps - 30-60 seconds  hold - Static Prone on Elbows  - 1 x daily - 7 x weekly - 2 sets - 10 reps - Supine Bridge  - 1 x daily - 7 x weekly - 3 sets - 10 reps - 3-5 seconds hold (can perform in B-stance)  - Clamshell  - 1 x daily - 7 x weekly - 3 sets - 10 reps - 2-3 seconds  hold - Seated Deadlifts (see pt instructions) - Side Stepping with Resistance at Ankles and Counter Support  - 1 x daily - 7 x weekly - Bird Dog  - 1 x daily - 7 x weekly - 3 sets - 10 reps - Serratus Activation at Wall with Foam Roll  - 1 x daily - 7 x weekly - 3 sets - 10 reps - Seated Shoulder Horizontal Abduction with Resistance  - 1 x daily - 7 x weekly - 1-2 sets - 10 reps - Prone Scapular Retraction Y  - 1 x daily - 7 x weekly - 3 sets - 10 reps - Prone Scapular Retraction Arms at Side  - 1 x daily - 7 x weekly - 3 sets - 10 reps - Prone Shoulder Extension  - 1 x daily - 7 x weekly - 3 sets - 10 reps - Kettlebell Suitcase Carry  - 1 x daily - 7 x weekly - 3 sets - 10 reps  GOALS: Goals reviewed with patient? Yes  SHORT TERM GOALS: Target date: 10/16/2024    Pt will improve 5 x STS to less than or equal to 20 seconds without BUE support to demonstrate improved functional strength and transfer efficiency.   Baseline: 22.75s w/BUE support  Goal status: INITIAL  2.  Pt will improve gait velocity to at least 2.95 ft/s w/LRAD for improved gait efficiency  and independence    Baseline: 2.81 ft/s  w/rollator (11/11) Goal status: REVISED   3.  to be assessed and LTG updated  Baseline:  Goal status: MET  4.  FGA to be assessed and LTG updated  Baseline:  Goal status: INITIAL  5.  Pt will lift 20# weight from ground using proper body mechanics for improved posterior chain strength and stability w/lifting.  Baseline:  Goal status: INITIAL   LONG TERM GOALS: Target date: 10/30/2024    Pt will improve 5 x STS to less than or equal to 15 seconds without UE support to demonstrate improved functional strength and transfer efficiency.   Baseline: 22.75s w/BUE support  Goal status: INITIAL  2.  Pt will improve gait velocity to at least 3.1 ft/s w/LRAD for improved gait efficiency and endurance   Baseline: 2.81 ft/s w/rollator (11/11) Goal status: REVISED   3.  Pt will ambulate greater than or equal to 1250 feet on with LARD and improved postural control for improved cardiovascular endurance and BLE strength.   Baseline: 1,013' w/rollator (11/11) Goal status: INITIAL  4.  FGA goal  Baseline:  Goal status: INITIAL  5.  Pt will lift 30# weight from ground using proper body mechanics for improved posterior chain strength and stability w/lifting.  Baseline:  Goal status: INITIAL  ASSESSMENT:  CLINICAL IMPRESSION: Emphasis of skilled PT session on postural control, posterior chain strength, stability w/hip hinge and anticipatory balance strategies. Pt very challenged w/suitcase carries when holding weight in RUE due to L hip weakness. Pt able to perform full deadlift to ground w/15# weight but required min-mod multimodal cues for proper hip hinge and neutral spine position throughout. Pt continues to be limited by excessive kyphotic posture and LLE weakness, resulting in decreased step length/clearance on RLE. Will continue per POC.    OBJECTIVE IMPAIRMENTS: Abnormal gait, decreased activity tolerance, decreased balance,  decreased endurance, decreased knowledge of condition, decreased mobility, difficulty walking, decreased strength, improper body mechanics, and postural dysfunction  ACTIVITY LIMITATIONS: carrying, lifting, bending, squatting, stairs, transfers, locomotion level, and caring for others  PARTICIPATION LIMITATIONS: cleaning, interpersonal relationship, driving, shopping, community activity, and yard work  PERSONAL FACTORS: 1 comorbidity: bilateral peripheral neuropathy are also affecting patient's functional outcome.   REHAB POTENTIAL: Good  CLINICAL DECISION MAKING: Evolving/moderate complexity  EVALUATION COMPLEXITY: Moderate  PLAN:  PT FREQUENCY: 1-2x/week  PT DURATION: 6 weeks  PLANNED INTERVENTIONS: [id number]- PT Re-evaluation, 97750- Physical Performance Testing, 97110-Therapeutic exercises, 97530- Therapeutic activity, [id number]- Neuromuscular re-education, 97535- Self Care, [id number]- Manual therapy, [id number]- Gait training, [phone]- Orthotic Initial, [phone]- Orthotic/Prosthetic subsequent, [phone]- Aquatic Therapy, [phone]- Electrical stimulation (manual), [phone] (1-2 muscles), [id number] (3+ muscles)- Dry Needling, Patient/Family education, Balance training, Stair training, Joint mobilization, Spinal mobilization, Vestibular training, and DME instructions  PLAN FOR NEXT SESSION: goals. Review HEP. Work on lifting, sit to stands w/reduced UE reliance, leg presses, functional hip strength, glute med strength, modified RDLs, prone I/Y/T   Marlon BRAVO Jazma Pickel, PT, DPT 10/13/2024, 2:18 PM

## 2024-10-14 ENCOUNTER — Ambulatory Visit: Admitting: Physical Therapy

## 2024-10-14 LAB — PROTEIN ELECTROPHORESIS, SERUM
Albumin ELP: 3.9 g/dL (ref 3.8–4.8)
Alpha 1: 0.2 g/dL (ref 0.2–0.3)
Alpha 2: 0.9 g/dL (ref 0.5–0.9)
Beta 2: 0.5 g/dL (ref 0.2–0.5)
Beta Globulin: 0.5 g/dL (ref 0.4–0.6)
Gamma Globulin: 1 g/dL (ref 0.8–1.7)
Total Protein: 7 g/dL (ref 6.1–8.1)

## 2024-10-14 LAB — IMMUNOFIXATION ELECTROPHORESIS
IgG (Immunoglobin G), Serum: 985 mg/dL (ref 600–1540)
IgM, Serum: 74 mg/dL (ref 50–300)
Immunoglobulin A: 431 mg/dL — ABNORMAL HIGH (ref 70–320)

## 2024-10-14 LAB — HEMOGLOBIN A1C
Hgb A1c MFr Bld: 5.7 % — ABNORMAL HIGH (ref <5.7)
Mean Plasma Glucose: 117 mg/dL
eAG (mmol/L): 6.5 mmol/L

## 2024-10-14 LAB — SYPHILIS: RPR W/REFLEX TO RPR TITER AND TREPONEMAL ANTIBODIES, TRADITIONAL SCREENING AND DIAGNOSIS ALGORITHM: RPR Ser Ql: NONREACTIVE

## 2024-10-14 LAB — B12 AND FOLATE PANEL
Folate: 16 ng/mL
Vitamin B-12: 367 pg/mL (ref 200–1100)

## 2024-10-14 LAB — TSH: TSH: 6.23 m[IU]/L — ABNORMAL HIGH (ref 0.40–4.50)

## 2024-10-16 ENCOUNTER — Ambulatory Visit: Admitting: Physical Therapy

## 2024-10-16 DIAGNOSIS — M6281 Muscle weakness (generalized): Secondary | ICD-10-CM | POA: Diagnosis not present

## 2024-10-16 DIAGNOSIS — R293 Abnormal posture: Secondary | ICD-10-CM

## 2024-10-16 DIAGNOSIS — R2689 Other abnormalities of gait and mobility: Secondary | ICD-10-CM

## 2024-10-16 DIAGNOSIS — R2681 Unsteadiness on feet: Secondary | ICD-10-CM

## 2024-10-16 NOTE — Therapy (Signed)
 OUTPATIENT PHYSICAL THERAPY NEURO TREATMENT   Patient Name: [name] MRN: [id number] DOB:29-Jan-1946, 78 y.o., male Today's Date: 10/16/2024   PCP: Avelina Greig BRAVO, MD REFERRING PROVIDER: Avelina Greig BRAVO, MD  END OF SESSION:  PT End of Session - 10/16/24 1313     Visit Number 8    Number of Visits 13    Date for Recertification  11/06/24    Authorization Type [employer]    Authorization Time Period 13 PT visits 09/18/2024 - 12/17/2024    PT Start Time 1312    PT Stop Time 1401    PT Time Calculation (min) 49 min    Equipment Utilized During Treatment Gait belt    Activity Tolerance Patient tolerated treatment well    Behavior During Therapy WFL for tasks assessed/performed               Past Medical History:  Diagnosis Date   Agatston coronary artery calcium  score greater than 400    Coronary calcium  score of 533. This was 69th percentile for age,   Allergy 15 yrs. or more   mosquito or wasp bites bad reaction   Aortic atherosclerosis    Arrhythmia    Cataract 10 yrs. or more   slow bloom checkup every 6 mos.   Hyperlipidemia LDL goal <70    Neuromuscular disorder (HCC) 09/2018   Parkinsons   Parkinson disease (HCC)    Sleep apnea 02/21/2021 sleep study   have Resair machine   Past Surgical History:  Procedure Laterality Date   TONSILLECTOMY     WISDOM TOOTH EXTRACTION     Patient Active Problem List   Diagnosis Date Noted   Gait instability 06/23/2024   Scoliosis 06/23/2024   Agatston coronary artery calcium  score greater than 400 02/02/2022   Aortic atherosclerosis 02/01/2022   SVT (supraventricular tachycardia) 01/12/2022   OSA (obstructive sleep apnea) 01/12/2022   Parkinsonism (HCC) 11/25/2020   Edema of right lower leg 11/25/2020   Hyperlipidemia LDL goal <70 11/25/2020   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 States he is doing well. Really challenged by suitcase carries, especially when holding on the R side. No falls.    Pt accompanied by: Wife, [name] in lobby   PERTINENT HISTORY:  HLD, cellulitis, lymphedema, low back pain, hearing loss, (undiagnosed w/PD in 2025)  PAIN:  Are you having pain? No  PRECAUTIONS: Fall  RED FLAGS: None   WEIGHT BEARING RESTRICTIONS: No  FALLS: Has patient fallen in last 6 months? No  LIVING ENVIRONMENT: Lives with: lives with their spouse Lives in: House/apartment Stairs: Yes: External: 2-3 steps; on right going up, on left going up, and can reach both Has following equipment at home: Walker - 4 wheeled, shower chair, Ramped entry, and StrongArm cane  PLOF: Requires assistive device for independence  PATIENT GOALS: I want to be able to stand up and walk without this thing (rollator) and strengthen this thing (RLE)   OBJECTIVE:  Note: Objective measures were completed at Evaluation unless otherwise noted.  DIAGNOSTIC FINDINGS: MRI of C-spine from 06/05/24  IMPRESSION: 1. Cervical spondylosis as outlined within the body of the report. 2. No significant spinal canal stenosis. 3. Multilevel foraminal stenosis, greatest on the right at C5-C6 (moderate at this site). 4. No more than mild disc degeneration within the cervical spine. 5. Multilevel facet arthropathy, greatest on the left at C4-C5 (advanced at this  site). 6. Facet ankylosis on the left at C3-C4. 7. Mild grade 1 anterolisthesis at C4-C5 and C5-C6.  MRI of lumbar spine from 04/30/24 IMPRESSION: 1. 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 2. 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 3. Mild spinal canal stenosis at L2-3. 4. No spinal canal stenosis or neural foraminal narrowing in the thoracic spine.  MRI of T-spine 04/30/24 IMPRESSION: 1. 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 2. 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 3. Mild spinal canal stenosis at L2-3. 4. No spinal canal stenosis or neural foraminal narrowing in the thoracic spine.  NCV w/EMG from 06/30/24  NCV & EMG Findings: Extensive electrodiagnostic evaluation of bilateral lower limbs shows: Bilateral sural and superficial peroneal/fibular sensory responses are absent. Bilateral peroneal/fibular (EDB) and tibial (AH) motor responses are absent. Bilateral peroneal/fibular (TA) motor responses show reduced amplitude (L1.69, R1.67 mV). Bilateral H reflex latencies are absent. Chronic motor axon loss changes WITH active denervation changes are seen in bilateral tibialis anterior and bilateral medial head of gastrocnemius muscles.   Impression: This is an abnormal study. 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 The findings are at least moderate in degree electrically. No definitive electrodiagnostic evidence of a right or left lumbosacral (L3-S1) motor radiculopathy.  COGNITION: Overall cognitive status: Within functional limits for tasks assessed   SENSATION: Pt denies  numbness/tingling in all extremities    EDEMA: Pt has chronic swelling of distal RLE    POSTURE: rounded shoulders, forward head, increased thoracic kyphosis, and flexed trunk   LOWER EXTREMITY ROM:     Active  Right Eval Left Eval  Hip flexion    Hip extension    Hip abduction    Hip adduction    Hip internal rotation    Hip external rotation    Knee flexion    Knee extension  Ankle dorsiflexion    Ankle plantarflexion    Ankle inversion    Ankle eversion     (Blank rows = not tested)  LOWER EXTREMITY MMT:  Tested in seated position   MMT Right Eval Left Eval  Hip flexion 5 5  Hip extension    Hip abduction 5 5  Hip adduction 5 5  Hip internal rotation    Hip external rotation    Knee flexion 5 5  Knee extension 5 5  Ankle dorsiflexion 5 5  Ankle plantarflexion    Ankle inversion    Ankle eversion    (Blank rows = not tested)  BED MOBILITY:  Not tested Independent per pt   TRANSFERS: Sit to stand: Modified independence  Assistive device utilized: None     Stand to sit: Modified independence  Assistive device utilized: None      RAMP:  Not tested  CURB:  Not tested  STAIRS: Not tested GAIT: Gait pattern: Bilateral genu valgus, excessive R ankle inversion/pronation, step through pattern, decreased step length- Left, decreased stance time- Right, decreased stride length, decreased hip/knee flexion- Right, decreased ankle dorsiflexion- Right, decreased ankle dorsiflexion- Left, lateral hip instability, lateral lean- Right, decreased trunk rotation, trunk flexed, and narrow BOS Distance walked: Various short clinic distances  Assistive device utilized: Walker - 4 wheeled Level of assistance: SBA Comments: Pt ambulates w/excessive thoracic kyphosis and forward flexed posture.    FUNCTIONAL TESTS:   [facility] PT Assessment - 10/16/24 1319       Transfers   Five time sit to stand comments  30.19s   no UE support     Ambulation/Gait   Gait velocity  32.8' over 12.16s = 2.71 ft/s w/rollator                                                                                                                                       TREATMENT:   Ther Act/Physical Performance/Ther Ex Encouraged pt to try to play his keyboard while standing by placing it on his elevating desk to work on standing posture.    [facility] PT Assessment - 10/16/24 1319       Transfers   Five time sit to stand comments  30.19s   no UE support     Ambulation/Gait   Gait velocity 32.8' over 12.16s = 2.71 ft/s w/rollator         Reviewed suitcase carries w/20# KB per pt request, x15' each UE. Pt performed well when holding on L side, but required CGA when holding on R side due to L hip weakness. Removed from HEP at this time as it continues to be difficult for pt.  In // bars, alt 6 box step ups w/BUE support, x12 reps per side, for improved functional BLE strength, single leg stability and step clearance. Pt continues to demonstrate significant R hip drop when standing on L side.  Seated oblique  crunches w/12# KB, x20 reps per side, w/min tactile cues applied to obliques to facilitate proper technique.  Seated OH raises w/6# med ball and extended elbows, 2x10 reps, for improved core stability and postural control.  Seated woodchops w/6# med ball, x10 reps per side for improved core stability.    PATIENT EDUCATION: Education details:  Updates to HEP Person educated: Patient Education method: Explanation, Demonstration, Actor cues, and Verbal cues Education comprehension: verbalized understanding, returned demonstration, verbal cues required, tactile cues required, and needs further education  HOME EXERCISE PROGRAM:  Access Code: [email or web address] URL: [email or web address] Date: 09/23/2024 Prepared by: Marlon Kadesia Robel  Exercises - Wall push up with strong push away  - 1 x daily - 7 x weekly - 3 sets - 10 reps - Dead Bug  - 1 x daily - 7 x weekly - 3 sets  - 10 reps - Correct Standing Posture  - 1 x daily - 5 x weekly - 30 hold - Standing Balance with Eyes Closed on Foam  - 1 x daily - 5 x weekly - 3 sets - 30 hold - Standing march with overhead hold   - 1 x daily - 7 x weekly - 3 sets - 10 reps - Seated Ankle Alphabet  - 1 x daily - 7 x weekly - 3 sets - 10 reps - Ankle Inversion with Resistance  - 1 x daily - 7 x weekly - 3 sets - 10 reps - Ankle Eversion with Resistance  - 1 x daily - 7 x weekly - 3 sets - 10 reps - Ankle Dorsiflexion with Resistance  - 1 x daily - 7 x weekly - 3 sets - 10 reps - Ankle and Toe Plantarflexion with Resistance  - 1 x daily - 7 x weekly - 3 sets - 10 reps - Calf stretch  - 1 x daily - 7 x weekly - 3 sets - 10 reps - Seated Cervical Sidebending Stretch  - 1 x daily - 7 x weekly - 1 sets - 4 reps - 30 second hold - Single Arm Doorway Pec Stretch at 90 Degrees Abduction  - 1 x daily - 7 x weekly - 2-3 reps - 30-60 seconds  hold - Static Prone on Elbows  - 1 x daily - 7 x weekly - 2 sets - 10 reps - Supine Bridge  - 1 x daily - 7 x weekly - 3 sets - 10 reps - 3-5 seconds hold (can perform in B-stance)  - Clamshell  - 1 x daily - 7 x weekly - 3 sets - 10 reps - 2-3 seconds  hold - Seated Deadlifts (see pt instructions) - Side Stepping with Resistance at Ankles and Counter Support  - 1 x daily - 7 x weekly - Bird Dog  - 1 x daily - 7 x weekly - 3 sets - 10 reps - Serratus Activation at Wall with Foam Roll  - 1 x daily - 7 x weekly - 3 sets - 10 reps - Seated Shoulder Horizontal Abduction with Resistance  - 1 x daily - 7 x weekly - 1-2 sets - 10 reps - Prone Scapular Retraction Y  - 1 x daily - 7 x weekly - 3 sets - 10 reps - Prone Scapular Retraction Arms at Side  - 1 x daily - 7 x weekly - 3 sets - 10 reps - Prone Shoulder Extension  - 1 x daily - 7 x weekly - 3 sets - 10 reps  GOALS:  Goals reviewed with patient? Yes  SHORT TERM GOALS: Target date: 10/16/2024    Pt will improve 5 x STS to less than or equal to  20 seconds without BUE support to demonstrate improved functional strength and transfer efficiency.   Baseline: 22.75s w/BUE support; 30.19s w/BUE support (12/4) Goal status: NOT MET  2.  Pt will improve gait velocity to at least 2.95 ft/s w/LRAD for improved gait efficiency and independence    Baseline: 2.81 ft/s w/rollator (11/11); 2.7 ft/s w/rollator (12/4) Goal status: NOT MET   3.  to be assessed and LTG updated  Baseline:  Goal status: MET  4.  FGA to be assessed and LTG updated  Baseline:  Goal status: INITIAL  5.  Pt will lift 20# weight from ground using proper body mechanics for improved posterior chain strength and stability w/lifting.  Baseline:  Goal status: INITIAL   LONG TERM GOALS: Target date: 10/30/2024    Pt will improve 5 x STS to less than or equal to 25 seconds without UE support to demonstrate improved functional strength and transfer efficiency.   Baseline: 22.75s w/BUE support; 30.19s w/BUE support (12/4) Goal status: REVISED  2.  Pt will improve gait velocity to at least 3.1 ft/s w/LRAD for improved gait efficiency and endurance   Baseline: 2.81 ft/s w/rollator (11/11); 2.7 ft/s w/rollator (12/4) Goal status: REVISED   3.  Pt will ambulate greater than or equal to 1250 feet on with LARD and improved postural control for improved cardiovascular endurance and BLE strength.   Baseline: 1,013' w/rollator (11/11) Goal status: INITIAL  4.  FGA goal  Baseline:  Goal status: INITIAL  5.  Pt will lift 30# weight from ground using proper body mechanics for improved posterior chain strength and stability w/lifting.  Baseline:  Goal status: INITIAL  ASSESSMENT:  CLINICAL IMPRESSION: Emphasis of skilled PT session on initiating STG assessment, functional core stability and postural control. Pt has not met any LTGs this date, but was able to perform 5x STS without UE support compared to needing BUE support on eval. Will continue goal  assessment next session. Pt continues to be limited by L glute med weakness, resulting in significant difficulty performing suitcase carries at home, so removed from HEP. Remainder of session spent on functional core stability and postural control, which was challenging for pt so will continue progressing in PT. Will continue per POC.    OBJECTIVE IMPAIRMENTS: Abnormal gait, decreased activity tolerance, decreased balance, decreased endurance, decreased knowledge of condition, decreased mobility, difficulty walking, decreased strength, improper body mechanics, and postural dysfunction  ACTIVITY LIMITATIONS: carrying, lifting, bending, squatting, stairs, transfers, locomotion level, and caring for others  PARTICIPATION LIMITATIONS: cleaning, interpersonal relationship, driving, shopping, community activity, and yard work  PERSONAL FACTORS: 1 comorbidity: bilateral peripheral neuropathy are also affecting patient's functional outcome.   REHAB POTENTIAL: Good  CLINICAL DECISION MAKING: Evolving/moderate complexity  EVALUATION COMPLEXITY: Moderate  PLAN:  PT FREQUENCY: 1-2x/week  PT DURATION: 6 weeks  PLANNED INTERVENTIONS: [id number]- PT Re-evaluation, 97750- Physical Performance Testing, 97110-Therapeutic exercises, 97530- Therapeutic activity, [id number]- Neuromuscular re-education, 97535- Self Care, [id number]- Manual therapy, [id number]- Gait training, [phone]- Orthotic Initial, [phone]- Orthotic/Prosthetic subsequent, [phone]- Aquatic Therapy, [phone]- Electrical stimulation (manual), [phone] (1-2 muscles), [phone] (3+ muscles)- Dry Needling, Patient/Family education, Balance training, Stair training, Joint mobilization, Spinal mobilization, Vestibular training, and DME instructions  PLAN FOR NEXT SESSION: goals. Review HEP. Work on lifting, sit to stands w/reduced UE reliance, leg presses, functional hip strength, glute med strength, modified  RDLs, prone I/Y/T, core stability    Marlon BRAVO Haden Cavenaugh, PT, DPT 10/16/2024,  2:05 PM

## 2024-10-21 ENCOUNTER — Ambulatory Visit: Admitting: Physical Therapy

## 2024-10-21 DIAGNOSIS — R293 Abnormal posture: Secondary | ICD-10-CM

## 2024-10-21 DIAGNOSIS — R2681 Unsteadiness on feet: Secondary | ICD-10-CM

## 2024-10-21 DIAGNOSIS — R2689 Other abnormalities of gait and mobility: Secondary | ICD-10-CM

## 2024-10-21 DIAGNOSIS — M6281 Muscle weakness (generalized): Secondary | ICD-10-CM | POA: Diagnosis not present

## 2024-10-21 NOTE — Therapy (Signed)
 OUTPATIENT PHYSICAL THERAPY NEURO TREATMENT   Patient Name: [name] MRN: [id number] DOB:March 11, 1946, 78 y.o., male Today's Date: 10/21/2024   PCP: Avelina Greig BRAVO, MD REFERRING PROVIDER: Avelina Greig BRAVO, MD  END OF SESSION:  PT End of Session - 10/21/24 1306     Visit Number 9    Number of Visits 13    Date for Recertification  11/06/24    Authorization Type [employer]    Authorization Time Period 13 PT visits 09/18/2024 - 12/17/2024    PT Start Time 1305    PT Stop Time 1354    PT Time Calculation (min) 49 min    Equipment Utilized During Treatment Gait belt    Activity Tolerance Patient tolerated treatment well    Behavior During Therapy WFL for tasks assessed/performed             Past Medical History:  Diagnosis Date   Agatston coronary artery calcium  score greater than 400    Coronary calcium  score of 533. This was 69th percentile for age,   Allergy 15 yrs. or more   mosquito or wasp bites bad reaction   Aortic atherosclerosis    Arrhythmia    Cataract 10 yrs. or more   slow bloom checkup every 6 mos.   Hyperlipidemia LDL goal <70    Neuromuscular disorder (HCC) 09/2018   Parkinsons   Parkinson disease (HCC)    Sleep apnea 02/21/2021 sleep study   have Resair machine   Past Surgical History:  Procedure Laterality Date   TONSILLECTOMY     WISDOM TOOTH EXTRACTION     Patient Active Problem List   Diagnosis Date Noted   Gait instability 06/23/2024   Scoliosis 06/23/2024   Agatston coronary artery calcium  score greater than 400 02/02/2022   Aortic atherosclerosis 02/01/2022   SVT (supraventricular tachycardia) 01/12/2022   OSA (obstructive sleep apnea) 01/12/2022   Parkinsonism (HCC) 11/25/2020   Edema of right lower leg 11/25/2020   Hyperlipidemia LDL goal <70 11/25/2020   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 States he is doing well, no changes to report. Cleared off his desk where his keyboard is but has not tried playing it while standing.    Pt accompanied by: Wife, [name] in lobby   PERTINENT HISTORY:  HLD, cellulitis, lymphedema, low back pain, hearing loss, (undiagnosed w/PD in 2025)  PAIN:  Are you having pain? No  PRECAUTIONS: Fall  RED FLAGS: None   WEIGHT BEARING RESTRICTIONS: No  FALLS: Has patient fallen in last 6 months? No  LIVING ENVIRONMENT: Lives with: lives with their spouse Lives in: House/apartment Stairs: Yes: External: 2-3 steps; on right going up, on left going up, and can reach both Has following equipment at home: Walker - 4 wheeled, shower chair, Ramped entry, and StrongArm cane  PLOF: Requires assistive device for independence  PATIENT GOALS: I want to be able to stand up and walk without this thing (rollator) and strengthen this thing (RLE)   OBJECTIVE:  Note: Objective measures were completed at Evaluation unless otherwise noted.  DIAGNOSTIC FINDINGS: MRI of C-spine from 06/05/24  IMPRESSION: 1. Cervical spondylosis as outlined within the body of the report. 2. No significant spinal canal stenosis. 3. Multilevel foraminal stenosis, greatest on the right at C5-C6 (moderate at this site). 4. No more than mild disc degeneration within the cervical spine. 5. Multilevel facet arthropathy, greatest on the left at C4-C5 (advanced  at this site). 6. Facet ankylosis on the left at C3-C4. 7. Mild grade 1 anterolisthesis at C4-C5 and C5-C6.  MRI of lumbar spine from 04/30/24 IMPRESSION: 1. 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 2. 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 3. Mild spinal canal stenosis at L2-3. 4. No spinal canal stenosis or neural foraminal narrowing in the thoracic spine.  MRI of T-spine 04/30/24 IMPRESSION: 1. 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 2. 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 3. Mild spinal canal stenosis at L2-3. 4. No spinal canal stenosis or neural foraminal narrowing in the thoracic spine.  NCV w/EMG from 06/30/24  NCV & EMG Findings: Extensive electrodiagnostic evaluation of bilateral lower limbs shows: Bilateral sural and superficial peroneal/fibular sensory responses are absent. Bilateral peroneal/fibular (EDB) and tibial (AH) motor responses are absent. Bilateral peroneal/fibular (TA) motor responses show reduced amplitude (L1.69, R1.67 mV). Bilateral H reflex latencies are absent. Chronic motor axon loss changes WITH active denervation changes are seen in bilateral tibialis anterior and bilateral medial head of gastrocnemius muscles.   Impression: This is an abnormal study. 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 The findings are at least moderate in degree electrically. No definitive electrodiagnostic evidence of a right or left lumbosacral (L3-S1) motor radiculopathy.  COGNITION: Overall cognitive status: Within functional limits for tasks assessed   SENSATION: Pt  denies numbness/tingling in all extremities    EDEMA: Pt has chronic swelling of distal RLE    POSTURE: rounded shoulders, forward head, increased thoracic kyphosis, and flexed trunk   LOWER EXTREMITY ROM:     Active  Right Eval Left Eval  Hip flexion    Hip extension    Hip abduction    Hip adduction    Hip internal rotation    Hip external rotation    Knee  flexion    Knee extension    Ankle dorsiflexion    Ankle plantarflexion    Ankle inversion    Ankle eversion     (Blank rows = not tested)  LOWER EXTREMITY MMT:  Tested in seated position   MMT Right Eval Left Eval  Hip flexion 5 5  Hip extension    Hip abduction 5 5  Hip adduction 5 5  Hip internal rotation    Hip external rotation    Knee flexion 5 5  Knee extension 5 5  Ankle dorsiflexion 5 5  Ankle plantarflexion    Ankle inversion    Ankle eversion    (Blank rows = not tested)  BED MOBILITY:  Not tested Independent per pt   TRANSFERS: Sit to stand: Modified independence  Assistive device utilized: None     Stand to sit: Modified independence  Assistive device utilized: None      RAMP:  Not tested  CURB:  Not tested  STAIRS: Not tested GAIT: Gait pattern: Bilateral genu valgus, excessive R ankle inversion/pronation, step through pattern, decreased step length- Left, decreased stance time- Right, decreased stride length, decreased hip/knee flexion- Right, decreased ankle dorsiflexion- Right, decreased ankle dorsiflexion- Left, lateral hip instability, lateral lean- Right, decreased trunk rotation, trunk flexed, and narrow BOS Distance walked: Various short clinic distances  Assistive device utilized: Walker - 4 wheeled Level of assistance: SBA Comments: Pt ambulates w/excessive thoracic kyphosis and forward flexed posture.    FUNCTIONAL TESTS:                                                                                                                                  TREATMENT:    Ther Act/Physical Performance/Ther Ex     Gait pattern: step through pattern, decreased step length- Right, decreased stance time- Left, decreased stride length, decreased ankle dorsiflexion- Right, decreased ankle dorsiflexion- Left, trendelenburg, lateral hip instability, trunk flexed, and narrow BOS Distance walked: 115' loop completed 7x + 110' = 915'  Assistive device utilized: [employer]ant - 4 wheeled Level of assistance: SBA Comments: Noted worsening posture after 4 minutes, but pt able to self-correct without cues. Pt states he was more focused on his gait kinematics rather than speed.   Reviewed proper deadlift technique w/20# KB x3 reps for STG assessment. Pt initially stood up too quickly, resulting in posterior LOB, requiring min A to stabilize. However, on remainder of reps, pt able to perform without instability and no assistance required.  On large blue theraball for improved core stability, single leg stability and proximal strength:  Alt marches, x12 reps per side. Increased difficulty performing on LLE. CGA-min A throughout to stabilize ball  Woodchops w/6# DB, x14 reps per side. Min A throughout to stabilize ball and mod verbal cues for proper technique  While sitting on airex on mat table (22.5 height), performed x8 sit to stands w/6# weight held in front rack position for  improved postural control and core stability. Pt very challenged by this, requiring CGA-min A on final few reps to facilitate upright posture.   PATIENT EDUCATION: Education details:  Continue HEP Person educated: Patient Education method: Explanation, Demonstration, Actor cues, and Verbal cues Education comprehension: verbalized understanding, returned demonstration, verbal cues required, tactile cues required, and needs further education  HOME EXERCISE PROGRAM:  Access Code: [email or web address] URL: [email or web address] Date: 09/23/2024 Prepared by: Marlon Naasir Carreira  Exercises - Wall push up  with strong push away  - 1 x daily - 7 x weekly - 3 sets - 10 reps - Dead Bug  - 1 x daily - 7 x weekly - 3 sets - 10 reps - Correct Standing Posture  - 1 x daily - 5 x weekly - 30 hold - Standing Balance with Eyes Closed on Foam  - 1 x daily - 5 x weekly - 3 sets - 30 hold - Standing march with overhead hold   - 1 x daily - 7 x weekly - 3 sets - 10 reps - Seated Ankle Alphabet  - 1 x daily - 7 x weekly - 3 sets - 10 reps - Ankle Inversion with Resistance  - 1 x daily - 7 x weekly - 3 sets - 10 reps - Ankle Eversion with Resistance  - 1 x daily - 7 x weekly - 3 sets - 10 reps - Ankle Dorsiflexion with Resistance  - 1 x daily - 7 x weekly - 3 sets - 10 reps - Ankle and Toe Plantarflexion with Resistance  - 1 x daily - 7 x weekly - 3 sets - 10 reps - Calf stretch  - 1 x daily - 7 x weekly - 3 sets - 10 reps - Seated Cervical Sidebending Stretch  - 1 x daily - 7 x weekly - 1 sets - 4 reps - 30 second hold - Single Arm Doorway Pec Stretch at 90 Degrees Abduction  - 1 x daily - 7 x weekly - 2-3 reps - 30-60 seconds  hold - Static Prone on Elbows  - 1 x daily - 7 x weekly - 2 sets - 10 reps - Supine Bridge  - 1 x daily - 7 x weekly - 3 sets - 10 reps - 3-5 seconds hold (can perform in B-stance)  - Clamshell  - 1 x daily - 7 x weekly - 3 sets - 10 reps - 2-3 seconds  hold - Seated Deadlifts (see pt instructions) - Side Stepping with Resistance at Ankles and Counter Support  - 1 x daily - 7 x weekly - Bird Dog  - 1 x daily - 7 x weekly - 3 sets - 10 reps - Serratus Activation at Wall with Foam Roll  - 1 x daily - 7 x weekly - 3 sets - 10 reps - Seated Shoulder Horizontal Abduction with Resistance  - 1 x daily - 7 x weekly - 1-2 sets - 10 reps - Prone Scapular Retraction Y  - 1 x daily - 7 x weekly - 3 sets - 10 reps - Prone Scapular Retraction Arms at Side  - 1 x daily - 7 x weekly - 3 sets - 10 reps - Prone Shoulder Extension  - 1 x daily - 7 x weekly - 3 sets - 10 reps  GOALS: Goals reviewed with  patient? Yes  SHORT TERM GOALS: Target date: 10/16/2024    Pt will improve 5 x STS to less than or equal to 20 seconds  without BUE support to demonstrate improved functional strength and transfer efficiency.   Baseline: 22.75s w/BUE support; 30.19s w/BUE support (12/4) Goal status: NOT MET  2.  Pt will improve gait velocity to at least 2.95 ft/s w/LRAD for improved gait efficiency and independence    Baseline: 2.81 ft/s w/rollator (11/11); 2.7 ft/s w/rollator (12/4) Goal status: NOT MET   3.  to be assessed and LTG updated  Baseline:  Goal status: MET  4.  FGA to be assessed and LTG updated  Baseline:  Goal status: DC  5.  Pt will lift 20# weight from ground using proper body mechanics for improved posterior chain strength and stability w/lifting.  Baseline:  Goal status: MET    LONG TERM GOALS: Target date: 10/30/2024    Pt will improve 5 x STS to less than or equal to 25 seconds without UE support to demonstrate improved functional strength and transfer efficiency.   Baseline: 22.75s w/BUE support; 30.19s w/BUE support (12/4) Goal status: REVISED  2.  Pt will improve gait velocity to at least 3.1 ft/s w/LRAD for improved gait efficiency and endurance   Baseline: 2.81 ft/s w/rollator (11/11); 2.7 ft/s w/rollator (12/4) Goal status: REVISED   3.  Pt will ambulate greater than or equal to 1250 feet on with LARD and improved postural control for improved cardiovascular endurance and BLE strength.   Baseline: 1,013' w/rollator (11/11) Goal status: INITIAL  4.  FGA goal  Baseline:  Goal status: DC  5.  Pt will lift 30# weight from ground using proper body mechanics for improved posterior chain strength and stability w/lifting.  Baseline:  Goal status: INITIAL  ASSESSMENT:  CLINICAL IMPRESSION: Emphasis of skilled PT session on STG assessment, proximal strengthening and core stability. Pt has met 2 of 4 STGs, demonstrating proper lifting technique of 20#  weight from ground and completing assessment. Remainder of goals assessed last session. Discontinued FGA goal as it is not applicable to pt's goals at this time. Pt continues to be limited by kyphotic posture w/functional core weakness and L hip abductor weakness. Pt challenged to maintain upright posture w/gait and exhibits decreased eccentric control on RLE due to L hip weakness, causing him to pitch forward as well. Pt is mod I w/use of rollator and is improving his core stability to maintain upright posture. Will continue per POC.    OBJECTIVE IMPAIRMENTS: Abnormal gait, decreased activity tolerance, decreased balance, decreased endurance, decreased knowledge of condition, decreased mobility, difficulty walking, decreased strength, improper body mechanics, and postural dysfunction  ACTIVITY LIMITATIONS: carrying, lifting, bending, squatting, stairs, transfers, locomotion level, and caring for others  PARTICIPATION LIMITATIONS: cleaning, interpersonal relationship, driving, shopping, community activity, and yard work  PERSONAL FACTORS: 1 comorbidity: bilateral peripheral neuropathy are also affecting patient's functional outcome.   REHAB POTENTIAL: Good  CLINICAL DECISION MAKING: Evolving/moderate complexity  EVALUATION COMPLEXITY: Moderate  PLAN:  PT FREQUENCY: 1-2x/week  PT DURATION: 6 weeks  PLANNED INTERVENTIONS: [id number]- PT Re-evaluation, 97750- Physical Performance Testing, 97110-Therapeutic exercises, 97530- Therapeutic activity, [id number]- Neuromuscular re-education, 97535- Self Care, [id number]- Manual therapy, [id number]- Gait training, [phone]- Orthotic Initial, [phone]- Orthotic/Prosthetic subsequent, [phone]- Aquatic Therapy, [phone]- Electrical stimulation (manual), [phone] (1-2 muscles), [id number] (3+ muscles)- Dry Needling, Patient/Family education, Balance training, Stair training, Joint mobilization, Spinal mobilization, Vestibular training, and DME instructions  PLAN FOR NEXT SESSION:  10th  visit. Review HEP. Work on lifting, sit to stands w/reduced UE reliance, leg presses, functional hip strength, glute med strength, modified RDLs, prone I/Y/T, core stability,  tall kneel tasks    Marlon BRAVO Sharbel Sahagun, PT, DPT 10/21/2024, 1:56 PM

## 2024-10-23 ENCOUNTER — Ambulatory Visit: Admitting: Physical Therapy

## 2024-10-23 DIAGNOSIS — R2689 Other abnormalities of gait and mobility: Secondary | ICD-10-CM

## 2024-10-23 DIAGNOSIS — M6281 Muscle weakness (generalized): Secondary | ICD-10-CM

## 2024-10-23 DIAGNOSIS — R2681 Unsteadiness on feet: Secondary | ICD-10-CM

## 2024-10-23 DIAGNOSIS — R293 Abnormal posture: Secondary | ICD-10-CM

## 2024-10-23 NOTE — Therapy (Signed)
 OUTPATIENT PHYSICAL THERAPY NEURO TREATMENT - 10TH VISIT PROGRESS NOTE   Patient Name: [name] MRN: [id number] DOB:Aug 25, 1946, 78 y.o., male Today's Date: 10/23/2024   PCP: Avelina Greig BRAVO, MD REFERRING PROVIDER: Avelina Greig BRAVO, MD  Physical Therapy Progress Note   Dates of Reporting Period:09/18/24 - 10/23/24  See Note below for Objective Data and Assessment of Progress/Goals.   END OF SESSION:  PT End of Session - 10/23/24 1318     Visit Number 10    Number of Visits 13    Date for Recertification  11/06/24    Authorization Type [employer]    Authorization Time Period 13 PT visits 09/18/2024 - 12/17/2024    PT Start Time 1316    PT Stop Time 1400    PT Time Calculation (min) 44 min    Equipment Utilized During Treatment Gait belt    Activity Tolerance Patient tolerated treatment well    Behavior During Therapy WFL for tasks assessed/performed             Past Medical History:  Diagnosis Date   Agatston coronary artery calcium  score greater than 400    Coronary calcium  score of 533. This was 69th percentile for age,   Allergy 15 yrs. or more   mosquito or wasp bites bad reaction   Aortic atherosclerosis    Arrhythmia    Cataract 10 yrs. or more   slow bloom checkup every 6 mos.   Hyperlipidemia LDL goal <70    Neuromuscular disorder (HCC) 09/2018   Parkinsons   Parkinson disease (HCC)    Sleep apnea 02/21/2021 sleep study   have Resair machine   Past Surgical History:  Procedure Laterality Date   TONSILLECTOMY     WISDOM TOOTH EXTRACTION     Patient Active Problem List   Diagnosis Date Noted   Gait instability 06/23/2024   Scoliosis 06/23/2024   Agatston coronary artery calcium  score greater than 400 02/02/2022   Aortic atherosclerosis 02/01/2022   SVT (supraventricular tachycardia) 01/12/2022   OSA (obstructive sleep apnea) 01/12/2022   Parkinsonism (HCC) 11/25/2020   Edema of right lower leg 11/25/2020   Hyperlipidemia LDL goal <70  11/25/2020   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 States he is doing well, did a long workout yesterday. Prone Y's are still really challenging, feels like he can barely move his arms with it. No falls.    Pt accompanied by: Wife, [name] in lobby   PERTINENT HISTORY:  HLD, cellulitis, lymphedema, low back pain, hearing loss, (undiagnosed w/PD in 2025)  PAIN:  Are you having pain? No  PRECAUTIONS: Fall  RED FLAGS: None   WEIGHT BEARING RESTRICTIONS: No  FALLS: Has patient fallen in last 6 months? No  LIVING ENVIRONMENT: Lives with: lives with their spouse Lives in: House/apartment Stairs: Yes: External: 2-3 steps; on right going up, on left going up, and can reach both Has following equipment at home: Walker - 4 wheeled, shower chair, Ramped entry, and StrongArm cane  PLOF: Requires assistive device for independence  PATIENT GOALS: I want to be able to stand up and walk without this thing (rollator) and strengthen this thing (RLE)   OBJECTIVE:  Note: Objective measures were completed at Evaluation unless otherwise noted.  DIAGNOSTIC FINDINGS: MRI of C-spine from 06/05/24  IMPRESSION: 1. Cervical spondylosis as outlined within the body of the report. 2. No significant spinal canal stenosis. 3. Multilevel foraminal  stenosis, greatest on the right at C5-C6 (moderate at this site). 4. No more than mild disc degeneration within the cervical spine. 5. Multilevel facet arthropathy, greatest on the left at C4-C5 (advanced at this site). 6. Facet ankylosis on the left at C3-C4. 7. Mild grade 1 anterolisthesis at C4-C5 and C5-C6.  MRI of lumbar spine from 04/30/24 IMPRESSION: 1. 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 2. 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 3. Mild spinal canal stenosis at L2-3. 4. No spinal canal stenosis or neural foraminal narrowing in the thoracic spine.  MRI of T-spine 04/30/24 IMPRESSION: 1. 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 2. 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 3. Mild spinal canal stenosis at L2-3. 4. No spinal canal stenosis or neural foraminal narrowing in the thoracic spine.  NCV w/EMG from 06/30/24  NCV & EMG Findings: Extensive electrodiagnostic evaluation of bilateral lower limbs shows: Bilateral sural and superficial peroneal/fibular sensory responses are absent. Bilateral peroneal/fibular (EDB) and tibial (AH) motor responses are absent. Bilateral peroneal/fibular (TA) motor responses show reduced amplitude (L1.69, R1.67 mV). Bilateral H reflex latencies are absent. Chronic motor axon loss changes WITH active denervation changes are seen in bilateral tibialis anterior and bilateral medial head of gastrocnemius muscles.   Impression: This is an abnormal study. 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 The findings are at least moderate in degree  electrically. No definitive electrodiagnostic evidence of a right or left lumbosacral (L3-S1) motor radiculopathy.  COGNITION: Overall cognitive status: Within functional limits for tasks assessed   SENSATION: Pt denies numbness/tingling in all extremities    EDEMA: Pt has chronic swelling of distal RLE    POSTURE: rounded shoulders, forward head, increased thoracic kyphosis, and flexed trunk   LOWER EXTREMITY ROM:     Active  Right Eval Left Eval  Hip flexion    Hip extension    Hip abduction    Hip adduction    Hip internal rotation    Hip external rotation  Knee flexion    Knee extension    Ankle dorsiflexion    Ankle plantarflexion    Ankle inversion    Ankle eversion     (Blank rows = not tested)  LOWER EXTREMITY MMT:  Tested in seated position   MMT Right Eval Left Eval  Hip flexion 5 5  Hip extension    Hip abduction 5 5  Hip adduction 5 5  Hip internal rotation    Hip external rotation    Knee flexion 5 5  Knee extension 5 5  Ankle dorsiflexion 5 5  Ankle plantarflexion    Ankle inversion    Ankle eversion    (Blank rows = not tested)  BED MOBILITY:  Not tested Independent per pt   TRANSFERS: Sit to stand: Modified independence  Assistive device utilized: None     Stand to sit: Modified independence  Assistive device utilized: None      RAMP:  Not tested  CURB:  Not tested  STAIRS: Not tested GAIT: Gait pattern: Bilateral genu valgus, excessive R ankle inversion/pronation, step through pattern, decreased step length- Left, decreased stance time- Right, decreased stride length, decreased hip/knee flexion- Right, decreased ankle dorsiflexion- Right, decreased ankle dorsiflexion- Left, lateral hip instability, lateral lean- Right, decreased trunk rotation, trunk flexed, and narrow BOS Distance walked: Various short clinic distances  Assistive device utilized: Walker - 4 wheeled Level of assistance: SBA Comments: Pt ambulates w/excessive  thoracic kyphosis and forward flexed posture.    FUNCTIONAL TESTS:                                                                                                                                  TREATMENT:   Ther Act/Ex Reviewed prone Y's and T's per pt request. Pt performed x10 T's and cued pt to focus on only moving BUEs, as he performs full spinal extension w/movement. Pt unable to lift arms on prone Y's despite tactile cues, so regressed to performing Y's while standing facing wall, x15 reps, which was much more successful for pt. Verbally added to HEP (see bolded below)   W/Back against wall, performed alt marches w cues to maintain scapular contact against wall to improve upright posture, core stability and single leg stability. Pt frequently losing balance to L side w/compensated [place] noted. Pt performed 2x8 reps per side.   At ballet bar, standing single leg circles, x15 reps per side, for improved glute med strength and single leg stability. Increased difficulty on LLE  Seated on dynadisc on mat table for improved core stability and hip flexor strength:  Holding two 5# dumbbells in front rack position, alt marches, 2x10 reps per side. Min cues to slow down and focus on exhaling when lifting leg for deep core activation.  Cross body punches w/5# dumbbells, x15 reps per side.   PATIENT EDUCATION: Education details:  Revisions to HEP  Person educated: Patient Education method: Explanation, Demonstration, Tactile cues,  and Verbal cues Education comprehension: verbalized understanding, returned demonstration, verbal cues required, tactile cues required, and needs further education  HOME EXERCISE PROGRAM:  Access Code: [email or web address] URL: [email or web address] Date: 09/23/2024 Prepared by: Marlon Jobina Maita  Exercises - Wall push up with strong push away  - 1 x daily - 7 x weekly - 3 sets - 10 reps - Dead Bug  - 1 x daily - 7 x weekly - 3 sets - 10 reps - Correct  Standing Posture  - 1 x daily - 5 x weekly - 30 hold - Standing Balance with Eyes Closed on Foam  - 1 x daily - 5 x weekly - 3 sets - 30 hold - Standing march with overhead hold   - 1 x daily - 7 x weekly - 3 sets - 10 reps - Seated Ankle Alphabet  - 1 x daily - 7 x weekly - 3 sets - 10 reps - Ankle Inversion with Resistance  - 1 x daily - 7 x weekly - 3 sets - 10 reps - Ankle Eversion with Resistance  - 1 x daily - 7 x weekly - 3 sets - 10 reps - Ankle Dorsiflexion with Resistance  - 1 x daily - 7 x weekly - 3 sets - 10 reps - Ankle and Toe Plantarflexion with Resistance  - 1 x daily - 7 x weekly - 3 sets - 10 reps - Calf stretch  - 1 x daily - 7 x weekly - 3 sets - 10 reps - Seated Cervical Sidebending Stretch  - 1 x daily - 7 x weekly - 1 sets - 4 reps - 30 second hold - Single Arm Doorway Pec Stretch at 90 Degrees Abduction  - 1 x daily - 7 x weekly - 2-3 reps - 30-60 seconds  hold - Static Prone on Elbows  - 1 x daily - 7 x weekly - 2 sets - 10 reps - Supine Bridge  - 1 x daily - 7 x weekly - 3 sets - 10 reps - 3-5 seconds hold (can perform in B-stance)  - Clamshell  - 1 x daily - 7 x weekly - 3 sets - 10 reps - 2-3 seconds  hold - Seated Deadlifts (see pt instructions) - Side Stepping with Resistance at Ankles and Counter Support  - 1 x daily - 7 x weekly - Bird Dog  - 1 x daily - 7 x weekly - 3 sets - 10 reps - Serratus Activation at Wall with Foam Roll  - 1 x daily - 7 x weekly - 3 sets - 10 reps - Seated Shoulder Horizontal Abduction with Resistance  - 1 x daily - 7 x weekly - 1-2 sets - 10 reps - Prone Scapular Retraction Y  - 1 x daily - 7 x weekly - 3 sets - 10 reps - changed to standing on 12/11  - Prone Scapular Retraction Arms at Side  - 1 x daily - 7 x weekly - 3 sets - 10 reps - Prone Shoulder Extension  - 1 x daily - 7 x weekly - 3 sets - 10 reps  GOALS: Goals reviewed with patient? Yes  SHORT TERM GOALS: Target date: 10/16/2024    Pt will improve 5 x STS to less than  or equal to 20 seconds without BUE support to demonstrate improved functional strength and transfer efficiency.   Baseline: 22.75s w/BUE support; 30.19s w/BUE support (12/4) Goal status: NOT MET  2.  Pt will improve gait velocity  to at least 2.95 ft/s w/LRAD for improved gait efficiency and independence    Baseline: 2.81 ft/s w/rollator (11/11); 2.7 ft/s w/rollator (12/4) Goal status: NOT MET   3.  to be assessed and LTG updated  Baseline:  Goal status: MET  4.  FGA to be assessed and LTG updated  Baseline:  Goal status: DC  5.  Pt will lift 20# weight from ground using proper body mechanics for improved posterior chain strength and stability w/lifting.  Baseline:  Goal status: MET    LONG TERM GOALS: Target date: 10/30/2024    Pt will improve 5 x STS to less than or equal to 25 seconds without UE support to demonstrate improved functional strength and transfer efficiency.   Baseline: 22.75s w/BUE support; 30.19s w/BUE support (12/4) Goal status: REVISED  2.  Pt will improve gait velocity to at least 3.1 ft/s w/LRAD for improved gait efficiency and endurance   Baseline: 2.81 ft/s w/rollator (11/11); 2.7 ft/s w/rollator (12/4) Goal status: REVISED   3.  Pt will ambulate greater than or equal to 1250 feet on with LARD and improved postural control for improved cardiovascular endurance and BLE strength.   Baseline: 1,013' w/rollator (11/11) Goal status: INITIAL  4.  FGA goal  Baseline:  Goal status: DC  5.  Pt will lift 30# weight from ground using proper body mechanics for improved posterior chain strength and stability w/lifting.  Baseline:  Goal status: INITIAL  ASSESSMENT:  CLINICAL IMPRESSION: Emphasis of skilled PT session on postural control, periscapular strength, core stability and single leg stability. Pt continues to be challenged by prone Y's due to impaired scapulohumeral rhythm and periscapular weakness, so revised movement to be performed  facing the wall to facilitate upright posture. Pt continues to demonstrate significant forward flexed posture in stance, but is able to self-correct and facilitate spinal/hip extensors now to assist with this. Pt will continue to benefit from skilled PT for improved postural control for reduced fall risk and improved independence w/mobility. Continue POC.    OBJECTIVE IMPAIRMENTS: Abnormal gait, decreased activity tolerance, decreased balance, decreased endurance, decreased knowledge of condition, decreased mobility, difficulty walking, decreased strength, improper body mechanics, and postural dysfunction  ACTIVITY LIMITATIONS: carrying, lifting, bending, squatting, stairs, transfers, locomotion level, and caring for others  PARTICIPATION LIMITATIONS: cleaning, interpersonal relationship, driving, shopping, community activity, and yard work  PERSONAL FACTORS: 1 comorbidity: bilateral peripheral neuropathy are also affecting patient's functional outcome.   REHAB POTENTIAL: Good  CLINICAL DECISION MAKING: Evolving/moderate complexity  EVALUATION COMPLEXITY: Moderate  PLAN:  PT FREQUENCY: 1-2x/week  PT DURATION: 6 weeks  PLANNED INTERVENTIONS: [id number]- PT Re-evaluation, 97750- Physical Performance Testing, 97110-Therapeutic exercises, 97530- Therapeutic activity, [id number]- Neuromuscular re-education, 97535- Self Care, [id number]- Manual therapy, [id number]- Gait training, [phone]- Orthotic Initial, [phone]- Orthotic/Prosthetic subsequent, [phone]- Aquatic Therapy, [phone]- Electrical stimulation (manual), [phone] (1-2 muscles), [id number] (3+ muscles)- Dry Needling, Patient/Family education, Balance training, Stair training, Joint mobilization, Spinal mobilization, Vestibular training, and DME instructions  PLAN FOR NEXT SESSION:  Review HEP. Work on lifting, sit to stands w/reduced UE reliance, leg presses, functional hip strength, glute med strength, modified RDLs, prone I/Y/T, core stability, tall kneel tasks     Tycho Cheramie E [place], PT, DPT 10/23/2024, 2:09 PM

## 2024-10-28 ENCOUNTER — Ambulatory Visit: Admitting: Physical Therapy

## 2024-10-28 DIAGNOSIS — M6281 Muscle weakness (generalized): Secondary | ICD-10-CM | POA: Diagnosis not present

## 2024-10-28 DIAGNOSIS — R2689 Other abnormalities of gait and mobility: Secondary | ICD-10-CM

## 2024-10-28 DIAGNOSIS — R2681 Unsteadiness on feet: Secondary | ICD-10-CM

## 2024-10-28 NOTE — Therapy (Signed)
 OUTPATIENT PHYSICAL THERAPY NEURO TREATMENT    Patient Name: [name] MRN: [id number] DOB:October 16, 1946, 78 y.o., male Today's Date: 10/28/2024   PCP: Avelina Greig BRAVO, MD REFERRING PROVIDER: Avelina Greig BRAVO, MD   END OF SESSION:  PT End of Session - 10/28/24 1320     Visit Number 11    Number of Visits 13    Date for Recertification  11/06/24    Authorization Type [employer]    Authorization Time Period 13 PT visits 09/18/2024 - 12/17/2024    PT Start Time 1318    PT Stop Time 1400    PT Time Calculation (min) 42 min    Equipment Utilized During Treatment Gait belt    Activity Tolerance Patient tolerated treatment well    Behavior During Therapy WFL for tasks assessed/performed             Past Medical History:  Diagnosis Date   Agatston coronary artery calcium  score greater than 400    Coronary calcium  score of 533. This was 69th percentile for age,   Allergy 15 yrs. or more   mosquito or wasp bites bad reaction   Aortic atherosclerosis    Arrhythmia    Cataract 10 yrs. or more   slow bloom checkup every 6 mos.   Hyperlipidemia LDL goal <70    Neuromuscular disorder (HCC) 09/2018   Parkinsons   Parkinson disease (HCC)    Sleep apnea 02/21/2021 sleep study   have Resair machine   Past Surgical History:  Procedure Laterality Date   TONSILLECTOMY     WISDOM TOOTH EXTRACTION     Patient Active Problem List   Diagnosis Date Noted   Gait instability 06/23/2024   Scoliosis 06/23/2024   Agatston coronary artery calcium  score greater than 400 02/02/2022   Aortic atherosclerosis 02/01/2022   SVT (supraventricular tachycardia) 01/12/2022   OSA (obstructive sleep apnea) 01/12/2022   Parkinsonism (HCC) 11/25/2020   Edema of right lower leg 11/25/2020   Hyperlipidemia LDL goal <70 11/25/2020   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 Denies pain or acute changes. HEP is still challenging.    Pt accompanied by: Wife, [name] in lobby   PERTINENT HISTORY:  HLD, cellulitis, lymphedema, low back pain, hearing loss, (undiagnosed w/PD in 2025)  PAIN:  Are you having pain? No  PRECAUTIONS: Fall  RED FLAGS: None   WEIGHT BEARING RESTRICTIONS: No  FALLS: Has patient fallen in last 6 months? No  LIVING ENVIRONMENT: Lives with: lives with their spouse Lives in: House/apartment Stairs: Yes: External: 2-3 steps; on right going up, on left going up, and can reach both Has following equipment at home: Walker - 4 wheeled, shower chair, Ramped entry, and StrongArm cane  PLOF: Requires assistive device for independence  PATIENT GOALS: I want to be able to stand up and walk without this thing (rollator) and strengthen this thing (RLE)   OBJECTIVE:  Note: Objective measures were completed at Evaluation unless otherwise noted.  DIAGNOSTIC FINDINGS: MRI of C-spine from 06/05/24  IMPRESSION: 1. Cervical spondylosis as outlined within the body of the report. 2. No significant spinal canal stenosis. 3. Multilevel foraminal stenosis, greatest on the right at C5-C6 (moderate at this site). 4. No more than mild disc degeneration within the cervical spine. 5. Multilevel facet arthropathy, greatest on the left at C4-C5 (advanced at this site). 6. Facet ankylosis on the left at C3-C4. 7. Mild grade 1  anterolisthesis at C4-C5 and C5-C6.  MRI of lumbar spine from 04/30/24 IMPRESSION: 1. 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 2. 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 3. Mild spinal canal stenosis at L2-3. 4. No spinal canal stenosis or neural foraminal narrowing in the thoracic spine.  MRI of T-spine 04/30/24 IMPRESSION: 1. 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 2. 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 3. Mild spinal canal stenosis at L2-3. 4. No spinal canal stenosis or neural foraminal narrowing in the thoracic spine.  NCV w/EMG from 06/30/24  NCV & EMG Findings: Extensive electrodiagnostic evaluation of bilateral lower limbs shows: Bilateral sural and superficial peroneal/fibular sensory responses are absent. Bilateral peroneal/fibular (EDB) and tibial (AH) motor responses are absent. Bilateral peroneal/fibular (TA) motor responses show reduced amplitude (L1.69, R1.67 mV). Bilateral H reflex latencies are absent. Chronic motor axon loss changes WITH active denervation changes are seen in bilateral tibialis anterior and bilateral medial head of gastrocnemius muscles.   Impression: This is an abnormal study. 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 The findings are at least moderate in degree electrically. No definitive electrodiagnostic evidence of a right or left lumbosacral (L3-S1) motor radiculopathy.  COGNITION: Overall cognitive status: Within functional limits for tasks assessed   SENSATION: Pt denies numbness/tingling in all extremities    EDEMA: Pt has chronic  swelling of distal RLE    POSTURE: rounded shoulders, forward head, increased thoracic kyphosis, and flexed trunk   LOWER EXTREMITY ROM:     Active  Right Eval Left Eval  Hip flexion    Hip extension    Hip abduction    Hip adduction    Hip internal rotation    Hip external rotation    Knee flexion    Knee extension    Ankle dorsiflexion    Ankle plantarflexion  Ankle inversion    Ankle eversion     (Blank rows = not tested)  LOWER EXTREMITY MMT:  Tested in seated position   MMT Right Eval Left Eval  Hip flexion 5 5  Hip extension    Hip abduction 5 5  Hip adduction 5 5  Hip internal rotation    Hip external rotation    Knee flexion 5 5  Knee extension 5 5  Ankle dorsiflexion 5 5  Ankle plantarflexion    Ankle inversion    Ankle eversion    (Blank rows = not tested)  BED MOBILITY:  Not tested Independent per pt   TRANSFERS: Sit to stand: Modified independence  Assistive device utilized: None     Stand to sit: Modified independence  Assistive device utilized: None      RAMP:  Not tested  CURB:  Not tested  STAIRS: Not tested GAIT: Gait pattern: Bilateral genu valgus, excessive R ankle inversion/pronation, step through pattern, decreased step length- Left, decreased stance time- Right, decreased stride length, decreased hip/knee flexion- Right, decreased ankle dorsiflexion- Right, decreased ankle dorsiflexion- Left, lateral hip instability, lateral lean- Right, decreased trunk rotation, trunk flexed, and narrow BOS Distance walked: Various short clinic distances  Assistive device utilized: Walker - 4 wheeled Level of assistance: SBA Comments: Pt ambulates w/excessive thoracic kyphosis and forward flexed posture.    FUNCTIONAL TESTS:                                                                                                                                  TREATMENT:   Ther Act/Ex Seated on dynadisc on mat table for improved core  stability, postural control and hip flexor strength:  Ball volley w2# dowel, x3 minutes. Increased difficulty reaching to R side w/frequent LOB to R.  Progressed to placing feet on Bosu (black side) w/CGA, x3 minutes. Increased instability to R side   OH presses w/wobble bar (2# dowel w/3# dumbbells attached to either end via red theraband), 2x12 reps. Min cues to push head through the window. Pt performed alt seated marches as therapist was setting up wobble bar activity, noted decreased eccentric control on R side  6 Blaze pods on random reach setting for improved proximal stability, functional hip strength and core stability. Performed on 2 minute intervals with 2 minute rest periods.  Pt requires SBA-CGA guarding. Round 1:  in quadruped, placed 3 pods to either side of pt in vertical line and cued pt to perform cross-body tap to pods.  61 hits. Round 2:  same setup but placed pt's knees on rockerboard in L/R direction (padded by airex) for added hip instability.  49 hits. Notable errors/deficits:  Increased difficulty reaching to R side   PATIENT EDUCATION: Education details:  Continue HEP  Person educated: Patient Education method: Explanation, Demonstration, Tactile cues, and Verbal cues Education comprehension: verbalized understanding, returned demonstration, verbal cues required, tactile cues required, and needs further  education  HOME EXERCISE PROGRAM:  Access Code: [email or web address] URL: [email or web address] Date: 09/23/2024 Prepared by: Marlon Maricela Kawahara  Exercises - Wall push up with strong push away  - 1 x daily - 7 x weekly - 3 sets - 10 reps - Dead Bug  - 1 x daily - 7 x weekly - 3 sets - 10 reps - Correct Standing Posture  - 1 x daily - 5 x weekly - 30 hold - Standing Balance with Eyes Closed on Foam  - 1 x daily - 5 x weekly - 3 sets - 30 hold - Standing march with overhead hold   - 1 x daily - 7 x weekly - 3 sets - 10 reps - Seated Ankle Alphabet  - 1 x daily - 7  x weekly - 3 sets - 10 reps - Ankle Inversion with Resistance  - 1 x daily - 7 x weekly - 3 sets - 10 reps - Ankle Eversion with Resistance  - 1 x daily - 7 x weekly - 3 sets - 10 reps - Ankle Dorsiflexion with Resistance  - 1 x daily - 7 x weekly - 3 sets - 10 reps - Ankle and Toe Plantarflexion with Resistance  - 1 x daily - 7 x weekly - 3 sets - 10 reps - Calf stretch  - 1 x daily - 7 x weekly - 3 sets - 10 reps - Seated Cervical Sidebending Stretch  - 1 x daily - 7 x weekly - 1 sets - 4 reps - 30 second hold - Single Arm Doorway Pec Stretch at 90 Degrees Abduction  - 1 x daily - 7 x weekly - 2-3 reps - 30-60 seconds  hold - Static Prone on Elbows  - 1 x daily - 7 x weekly - 2 sets - 10 reps - Supine Bridge  - 1 x daily - 7 x weekly - 3 sets - 10 reps - 3-5 seconds hold (can perform in B-stance)  - Clamshell  - 1 x daily - 7 x weekly - 3 sets - 10 reps - 2-3 seconds  hold - Seated Deadlifts (see pt instructions) - Side Stepping with Resistance at Ankles and Counter Support  - 1 x daily - 7 x weekly - Bird Dog  - 1 x daily - 7 x weekly - 3 sets - 10 reps - Serratus Activation at Wall with Foam Roll  - 1 x daily - 7 x weekly - 3 sets - 10 reps - Seated Shoulder Horizontal Abduction with Resistance  - 1 x daily - 7 x weekly - 1-2 sets - 10 reps - Prone Scapular Retraction Y  - 1 x daily - 7 x weekly - 3 sets - 10 reps - changed to standing on 12/11  - Prone Scapular Retraction Arms at Side  - 1 x daily - 7 x weekly - 3 sets - 10 reps - Prone Shoulder Extension  - 1 x daily - 7 x weekly - 3 sets - 10 reps  GOALS: Goals reviewed with patient? Yes  SHORT TERM GOALS: Target date: 10/16/2024    Pt will improve 5 x STS to less than or equal to 20 seconds without BUE support to demonstrate improved functional strength and transfer efficiency.   Baseline: 22.75s w/BUE support; 30.19s w/BUE support (12/4) Goal status: NOT MET  2.  Pt will improve gait velocity to at least 2.95 ft/s w/LRAD for  improved gait efficiency and independence    Baseline: 2.81 ft/s  w/rollator (11/11); 2.7 ft/s w/rollator (12/4) Goal status: NOT MET   3.  to be assessed and LTG updated  Baseline:  Goal status: MET  4.  FGA to be assessed and LTG updated  Baseline:  Goal status: DC  5.  Pt will lift 20# weight from ground using proper body mechanics for improved posterior chain strength and stability w/lifting.  Baseline:  Goal status: MET    LONG TERM GOALS: Target date: 10/30/2024    Pt will improve 5 x STS to less than or equal to 25 seconds without UE support to demonstrate improved functional strength and transfer efficiency.   Baseline: 22.75s w/BUE support; 30.19s w/BUE support (12/4) Goal status: REVISED  2.  Pt will improve gait velocity to at least 3.1 ft/s w/LRAD for improved gait efficiency and endurance   Baseline: 2.81 ft/s w/rollator (11/11); 2.7 ft/s w/rollator (12/4) Goal status: REVISED   3.  Pt will ambulate greater than or equal to 1250 feet on with LARD and improved postural control for improved cardiovascular endurance and BLE strength.   Baseline: 1,013' w/rollator (11/11) Goal status: INITIAL  4.  FGA goal  Baseline:  Goal status: DC  5.  Pt will lift 30# weight from ground using proper body mechanics for improved posterior chain strength and stability w/lifting.  Baseline:  Goal status: INITIAL  ASSESSMENT:  CLINICAL IMPRESSION: Emphasis of skilled PT session on postural control, core stability and proximal stability. Pt continues to be limited by functional core and L hip abductor weakness but has made improvements in PT as noted by ability to maintain balance on rockerboard in quadruped today. Pt in agreement to add more appointments at a frequency of 1x/week into the new year so will send recert next session. Continue POC.    OBJECTIVE IMPAIRMENTS: Abnormal gait, decreased activity tolerance, decreased balance, decreased endurance, decreased  knowledge of condition, decreased mobility, difficulty walking, decreased strength, improper body mechanics, and postural dysfunction  ACTIVITY LIMITATIONS: carrying, lifting, bending, squatting, stairs, transfers, locomotion level, and caring for others  PARTICIPATION LIMITATIONS: cleaning, interpersonal relationship, driving, shopping, community activity, and yard work  PERSONAL FACTORS: 1 comorbidity: bilateral peripheral neuropathy are also affecting patient's functional outcome.   REHAB POTENTIAL: Good  CLINICAL DECISION MAKING: Evolving/moderate complexity  EVALUATION COMPLEXITY: Moderate  PLAN:  PT FREQUENCY: 1-2x/week  PT DURATION: 6 weeks  PLANNED INTERVENTIONS: [id number]- PT Re-evaluation, 97750- Physical Performance Testing, 97110-Therapeutic exercises, 97530- Therapeutic activity, [id number]- Neuromuscular re-education, 97535- Self Care, [id number]- Manual therapy, [id number]- Gait training, [phone]- Orthotic Initial, [phone]- Orthotic/Prosthetic subsequent, [phone]- Aquatic Therapy, [phone]- Electrical stimulation (manual), [phone] (1-2 muscles), [id number] (3+ muscles)- Dry Needling, Patient/Family education, Balance training, Stair training, Joint mobilization, Spinal mobilization, Vestibular training, and DME instructions  PLAN FOR NEXT SESSION:  Goals and recert. Review HEP. Work on lifting, sit to stands w/reduced UE reliance, leg presses, functional hip strength, glute med strength, modified RDLs, prone I/Y/T, core stability, tall kneel tasks    Dyer Klug E Kandon Hosking, PT, DPT 10/28/2024, 2:09 PM

## 2024-10-30 ENCOUNTER — Ambulatory Visit: Admitting: Physical Therapy

## 2024-10-30 DIAGNOSIS — R2689 Other abnormalities of gait and mobility: Secondary | ICD-10-CM

## 2024-10-30 DIAGNOSIS — R2681 Unsteadiness on feet: Secondary | ICD-10-CM

## 2024-10-30 DIAGNOSIS — R293 Abnormal posture: Secondary | ICD-10-CM

## 2024-10-30 DIAGNOSIS — M6281 Muscle weakness (generalized): Secondary | ICD-10-CM

## 2024-10-30 NOTE — Therapy (Signed)
 OUTPATIENT PHYSICAL THERAPY NEURO TREATMENT - RECERTIFICATION   Patient Name: [name] MRN: [id number] DOB:1946-09-06, 78 y.o., male Today's Date: 10/30/2024   PCP: Avelina Greig BRAVO, MD REFERRING PROVIDER: Avelina Greig BRAVO, MD   END OF SESSION:  PT End of Session - 10/30/24 1322     Visit Number 12    Number of Visits 16    Date for Recertification  12/09/24    Authorization Type [employer]    Authorization Time Period 13 PT visits 09/18/2024 - 12/17/2024    PT Start Time 1321   Previous pt session ran over   PT Stop Time 1402    PT Time Calculation (min) 41 min    Equipment Utilized During Treatment Gait belt    Activity Tolerance Patient tolerated treatment well    Behavior During Therapy WFL for tasks assessed/performed             Past Medical History:  Diagnosis Date   Agatston coronary artery calcium  score greater than 400    Coronary calcium  score of 533. This was 69th percentile for age,   Allergy 15 yrs. or more   mosquito or wasp bites bad reaction   Aortic atherosclerosis    Arrhythmia    Cataract 10 yrs. or more   slow bloom checkup every 6 mos.   Hyperlipidemia LDL goal <70    Neuromuscular disorder (HCC) 09/2018   Parkinsons   Parkinson disease (HCC)    Sleep apnea 02/21/2021 sleep study   have Resair machine   Past Surgical History:  Procedure Laterality Date   TONSILLECTOMY     WISDOM TOOTH EXTRACTION     Patient Active Problem List   Diagnosis Date Noted   Gait instability 06/23/2024   Scoliosis 06/23/2024   Agatston coronary artery calcium  score greater than 400 02/02/2022   Aortic atherosclerosis 02/01/2022   SVT (supraventricular tachycardia) 01/12/2022   OSA (obstructive sleep apnea) 01/12/2022   Parkinsonism (HCC) 11/25/2020   Edema of right lower leg 11/25/2020   Hyperlipidemia LDL goal <70 11/25/2020   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 Denies pain or acute changes. HEP is still challenging. Inflated his theraball, wants to review those exercises today.    Pt accompanied by: Wife, [name] in lobby   PERTINENT HISTORY:  HLD, cellulitis, lymphedema, low back pain, hearing loss, (undiagnosed w/PD in 2025)  PAIN:  Are you having pain? No  PRECAUTIONS: Fall  RED FLAGS: None   WEIGHT BEARING RESTRICTIONS: No  FALLS: Has patient fallen in last 6 months? No  LIVING ENVIRONMENT: Lives with: lives with their spouse Lives in: House/apartment Stairs: Yes: External: 2-3 steps; on right going up, on left going up, and can reach both Has following equipment at home: Walker - 4 wheeled, shower chair, Ramped entry, and StrongArm cane  PLOF: Requires assistive device for independence  PATIENT GOALS: I want to be able to stand up and walk without this thing (rollator) and strengthen this thing (RLE)   OBJECTIVE:  Note: Objective measures were completed at Evaluation unless otherwise noted.  DIAGNOSTIC FINDINGS: MRI of C-spine from 06/05/24  IMPRESSION: 1. Cervical spondylosis as outlined within the body of the report. 2. No significant spinal canal stenosis. 3. Multilevel foraminal stenosis, greatest on the right at C5-C6 (moderate at this site). 4. No more than mild disc degeneration within the cervical spine. 5. Multilevel facet  arthropathy, greatest on the left at C4-C5 (advanced at this site). 6. Facet ankylosis on the left at C3-C4. 7. Mild grade 1 anterolisthesis at C4-C5 and C5-C6.  MRI of lumbar spine from 04/30/24 IMPRESSION: 1. 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 2. 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 3. Mild spinal canal stenosis at L2-3. 4. No spinal canal stenosis or neural foraminal narrowing in the thoracic spine.  MRI of T-spine 04/30/24 IMPRESSION: 1. 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 2. 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 3. Mild spinal canal stenosis at L2-3. 4. No spinal canal stenosis or neural foraminal narrowing in the thoracic spine.  NCV w/EMG from 06/30/24  NCV & EMG Findings: Extensive electrodiagnostic evaluation of bilateral lower limbs shows: Bilateral sural and superficial peroneal/fibular sensory responses are absent. Bilateral peroneal/fibular (EDB) and tibial (AH) motor responses are absent. Bilateral peroneal/fibular (TA) motor responses show reduced amplitude (L1.69, R1.67 mV). Bilateral H reflex latencies are absent. Chronic motor axon loss changes WITH active denervation changes are seen in bilateral tibialis anterior and bilateral medial head of gastrocnemius muscles.   Impression: This is an abnormal study. 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 The findings are at least moderate in degree electrically. No definitive electrodiagnostic evidence of a right or left lumbosacral (L3-S1) motor radiculopathy.  COGNITION: Overall cognitive status: Within  functional limits for tasks assessed   SENSATION: Pt denies numbness/tingling in all extremities    EDEMA: Pt has chronic swelling of distal RLE    POSTURE: rounded shoulders, forward head, increased thoracic kyphosis, and flexed trunk   LOWER EXTREMITY ROM:     Active  Right Eval Left Eval  Hip flexion    Hip extension    Hip abduction    Hip adduction    Hip internal rotation    Hip external rotation    Knee flexion    Knee extension  Ankle dorsiflexion    Ankle plantarflexion    Ankle inversion    Ankle eversion     (Blank rows = not tested)  LOWER EXTREMITY MMT:  Tested in seated position   MMT Right Eval Left Eval  Hip flexion 5 5  Hip extension    Hip abduction 5 5  Hip adduction 5 5  Hip internal rotation    Hip external rotation    Knee flexion 5 5  Knee extension 5 5  Ankle dorsiflexion 5 5  Ankle plantarflexion    Ankle inversion    Ankle eversion    (Blank rows = not tested)  BED MOBILITY:  Not tested Independent per pt   TRANSFERS: Sit to stand: Modified independence  Assistive device utilized: None     Stand to sit: Modified independence  Assistive device utilized: None      RAMP:  Not tested  CURB:  Not tested  STAIRS: Not tested GAIT: Gait pattern: Bilateral genu valgus, excessive R ankle inversion/pronation, step through pattern, decreased step length- Left, decreased stance time- Right, decreased stride length, decreased hip/knee flexion- Right, decreased ankle dorsiflexion- Right, decreased ankle dorsiflexion- Left, lateral hip instability, lateral lean- Right, decreased trunk rotation, trunk flexed, and narrow BOS Distance walked: Various short clinic distances  Assistive device utilized: Walker - 4 wheeled Level of assistance: SBA Comments: Pt ambulates w/excessive thoracic kyphosis and forward flexed posture.    FUNCTIONAL TESTS:   [facility] PT Assessment - 10/30/24 1328       Ambulation/Gait   Gait velocity 32.8' over  11.25s = 2.91 ft/s w/rollator      Balance   Balance Assessed Yes      Standardized Balance Assessment   Standardized Balance Assessment Five Times Sit to Stand    Five times sit to stand comments  32.35s; 24.28s   32.35s from 18 height; from 20 height                                                                                                                                       TREATMENT:   Physical performance   [facility] PT Assessment - 10/30/24 1328       Ambulation/Gait   Gait velocity 32.8' over 11.25s = 2.91 ft/s w/rollator      Balance   Balance Assessed Yes      Standardized Balance Assessment   Standardized Balance Assessment Five Times Sit to Stand    Five times sit to stand comments  32.35s; 24.28s   32.35s from 18 height; from 20 height        NMR/Ther Act  From mat table while seated on airex (22.5 height), sit to stands while holding 10# KB in front rack position for improved quad/glute strength, transfers and core stability. Pt performed 3x8 reps. Mod multimodal cues to avoid premature extension of knees as this causes pt to lose balance posteriorly. RPE of  7-8/10 following activity  Progressed to sit to stands from 20 mat w/no weight and no UE support, x7 reps (failure).  With large blue theraball for improved core stability, single leg stability and posterior chain strength  Alt marches, x8 reps per side w/min A due to lateral instability to R side. Pt demonstrating poor eccentric control of BLEs, so cued for improved control. Encouraged pt to perform close to a wall for safety at home due to lateral instability. Also informed pt he can perform alt knee extensions instead of marching.  Verbally reviewed woodchops on theraball, pt feels good with this and did not need to review   Supine glute bridges w/feet on theraball, 2x10 reps (added to HEP (see bolded below)).   PATIENT EDUCATION: Education details:  updates to HEP, goal results, goals for  extended POC  Person educated: Patient Education method: Explanation, Demonstration, Tactile cues, Verbal cues, and Handouts Education comprehension: verbalized understanding, returned demonstration, verbal cues required, tactile cues required, and needs further education  HOME EXERCISE PROGRAM:  Access Code: [email or web address] URL: [email or web address] Date: 09/23/2024 Prepared by: Marlon Graclyn Lawther  Exercises - Wall push up with strong push away  - 1 x daily - 7 x weekly - 3 sets - 10 reps - Dead Bug  - 1 x daily - 7 x weekly - 3 sets - 10 reps - Correct Standing Posture  - 1 x daily - 5 x weekly - 30 hold - Standing Balance with Eyes Closed on Foam  - 1 x daily - 5 x weekly - 3 sets - 30 hold - Standing march with overhead hold   - 1 x daily - 7 x weekly - 3 sets - 10 reps - Seated Ankle Alphabet  - 1 x daily - 7 x weekly - 3 sets - 10 reps - Ankle Inversion with Resistance  - 1 x daily - 7 x weekly - 3 sets - 10 reps - Ankle Eversion with Resistance  - 1 x daily - 7 x weekly - 3 sets - 10 reps - Ankle Dorsiflexion with Resistance  - 1 x daily - 7 x weekly - 3 sets - 10 reps - Ankle and Toe Plantarflexion with Resistance  - 1 x daily - 7 x weekly - 3 sets - 10 reps - Calf stretch  - 1 x daily - 7 x weekly - 3 sets - 10 reps - Seated Cervical Sidebending Stretch  - 1 x daily - 7 x weekly - 1 sets - 4 reps - 30 second hold - Single Arm Doorway Pec Stretch at 90 Degrees Abduction  - 1 x daily - 7 x weekly - 2-3 reps - 30-60 seconds  hold - Static Prone on Elbows  - 1 x daily - 7 x weekly - 2 sets - 10 reps - Supine Bridge  - 1 x daily - 7 x weekly - 3 sets - 10 reps - 3-5 seconds hold (can perform in B-stance)  - Clamshell  - 1 x daily - 7 x weekly - 3 sets - 10 reps - 2-3 seconds  hold - Seated Deadlifts (see pt instructions) - Side Stepping with Resistance at Ankles and Counter Support  - 1 x daily - 7 x weekly - Bird Dog  - 1 x daily - 7 x weekly - 3 sets - 10 reps - Serratus  Activation at Wall with Foam Roll  - 1 x daily - 7 x weekly - 3 sets - 10 reps -  Seated Shoulder Horizontal Abduction with Resistance  - 1 x daily - 7 x weekly - 1-2 sets - 10 reps - Prone Scapular Retraction Y  - 1 x daily - 7 x weekly - 3 sets - 10 reps - changed to standing on 12/11  - Prone Scapular Retraction Arms at Side  - 1 x daily - 7 x weekly - 3 sets - 10 reps - Prone Shoulder Extension  - 1 x daily - 7 x weekly - 3 sets - 10 reps - Supine Bridge with Heels on Swiss Ball and Knees Bent  - 1 x daily - 7 x weekly - 3 sets - 10 reps  GOALS: Goals reviewed with patient? Yes  SHORT TERM GOALS: Target date: 10/16/2024    Pt will improve 5 x STS to less than or equal to 20 seconds without BUE support to demonstrate improved functional strength and transfer efficiency.   Baseline: 22.75s w/BUE support; 30.19s w/BUE support (12/4) Goal status: NOT MET  2.  Pt will improve gait velocity to at least 2.95 ft/s w/LRAD for improved gait efficiency and independence    Baseline: 2.81 ft/s w/rollator (11/11); 2.7 ft/s w/rollator (12/4) Goal status: NOT MET   3.  to be assessed and LTG updated  Baseline:  Goal status: MET  4.  FGA to be assessed and LTG updated  Baseline:  Goal status: DC  5.  Pt will lift 20# weight from ground using proper body mechanics for improved posterior chain strength and stability w/lifting.  Baseline:  Goal status: MET    LONG TERM GOALS: Target date: 10/30/2024    Pt will improve 5 x STS to less than or equal to 25 seconds without UE support to demonstrate improved functional strength and transfer efficiency.   Baseline: 22.75s w/BUE support; 30.19s w/BUE support (12/4); 32.35s w/BUE support Goal status: IN PROGRESS   2.  Pt will improve gait velocity to at least 3.1 ft/s w/LRAD for improved gait efficiency and endurance   Baseline: 2.81 ft/s w/rollator (11/11); 2.7 ft/s w/rollator (12/4); 2.91 ft/s w/rollator (12/18)  Goal status: NOT MET     3.  Pt will ambulate greater than or equal to 1250 feet on with LARD and improved postural control for improved cardiovascular endurance and BLE strength.   Baseline: 1,013' w/rollator (11/11) Goal status: IN PROGRESS   4.  FGA goal  Baseline:  Goal status: DC  5.  Pt will lift 30# weight from ground using proper body mechanics for improved posterior chain strength and stability w/lifting.  Baseline:  Goal status: IN PROGRESS   NEW LONG TERM GOALS:  Target date: 12/09/2024  Pt will improve 5 x STS to less than or equal to 28 seconds from 18 height without UE support to demonstrate improved functional strength and transfer efficiency.  Baseline: 32.35s w/BUE support (12/18) Goal status: INITIAL  2.  Pt will lift 30# weight from ground using proper body mechanics for improved posterior chain strength and stability w/lifting.  Baseline:  Goal status: IN PROGRESS  3.  Pt will ambulate greater than or equal to 1250 feet on with LARD and improved postural control for improved cardiovascular endurance and BLE strength.  Baseline: 1,013' w/rollator (11/11) Goal status: IN PROGRESS  ASSESSMENT:  CLINICAL IMPRESSION: Emphasis of skilled PT session on LTG assessment, functional BLE strength and core stability. Pt did not met any LTGs this date, but did improve his gait speed w/use of rollator and continues to progress towards  goals. Pt challenged w/sit to stands from low height w/no UE support but is able to perform from elevated height (20). Pt continues to work on improving his core strength and postural control in order to improve his stability w/lifts as well. Reviewed theraball exercises as pt has one at home and pt very challenged by alt marches due to functional hip flexor and core weakness, so educated pt on how to perform this safely at home. Pt in agreement to recert at a frequency of 1x/week for 4 more weeks to continue improving hip strength and postural control.  Continue POC.    OBJECTIVE IMPAIRMENTS: Abnormal gait, decreased activity tolerance, decreased balance, decreased endurance, decreased knowledge of condition, decreased mobility, difficulty walking, decreased strength, improper body mechanics, and postural dysfunction  ACTIVITY LIMITATIONS: carrying, lifting, bending, squatting, stairs, transfers, locomotion level, and caring for others  PARTICIPATION LIMITATIONS: cleaning, interpersonal relationship, driving, shopping, community activity, and yard work  PERSONAL FACTORS: 1 comorbidity: bilateral peripheral neuropathy are also affecting patient's functional outcome.   REHAB POTENTIAL: Good  CLINICAL DECISION MAKING: Evolving/moderate complexity  EVALUATION COMPLEXITY: Moderate  PLAN:  PT FREQUENCY: 1-2x/week (recert)   PT DURATION: 6 weeks + 4 weeks (recert)   PLANNED INTERVENTIONS: 02835- PT Re-evaluation, 97750- Physical Performance Testing, 97110-Therapeutic exercises, 97530- Therapeutic activity, [id number]- Neuromuscular re-education, 97535- Self Care, [id number]- Manual therapy, [id number]- Gait training, [phone]- Orthotic Initial, [phone]- Orthotic/Prosthetic subsequent, [phone]- Aquatic Therapy, [phone]- Electrical stimulation (manual), [phone] (1-2 muscles), [id number] (3+ muscles)- Dry Needling, Patient/Family education, Balance training, Stair training, Joint mobilization, Spinal mobilization, Vestibular training, and DME instructions  PLAN FOR NEXT SESSION:  Send [employer] form. Review HEP. Work on lifting, sit to stands w/reduced UE reliance, leg presses, functional hip strength, glute med strength, modified RDLs, prone I/Y/T, core stability, tall kneel tasks   Marlon BRAVO Tyquez Hollibaugh, PT, DPT 10/30/2024, 2:08 PM

## 2024-11-11 ENCOUNTER — Ambulatory Visit (INDEPENDENT_AMBULATORY_CARE_PROVIDER_SITE_OTHER): Admitting: Family Medicine

## 2024-11-11 ENCOUNTER — Ambulatory Visit: Payer: Self-pay | Admitting: Family Medicine

## 2024-11-11 ENCOUNTER — Encounter: Payer: Self-pay | Admitting: Family Medicine

## 2024-11-11 VITALS — BP 102/60 | HR 81 | Temp 98.2°F | Ht 69.0 in | Wt 247.5 lb

## 2024-11-11 DIAGNOSIS — R7989 Other specified abnormal findings of blood chemistry: Secondary | ICD-10-CM | POA: Insufficient documentation

## 2024-11-11 DIAGNOSIS — R2681 Unsteadiness on feet: Secondary | ICD-10-CM

## 2024-11-11 DIAGNOSIS — R29898 Other symptoms and signs involving the musculoskeletal system: Secondary | ICD-10-CM | POA: Diagnosis not present

## 2024-11-11 DIAGNOSIS — M8588 Other specified disorders of bone density and structure, other site: Secondary | ICD-10-CM | POA: Insufficient documentation

## 2024-11-11 LAB — TSH: TSH: 5.97 u[IU]/mL — ABNORMAL HIGH (ref 0.35–5.50)

## 2024-11-11 LAB — T3, FREE: T3, Free: 3.5 pg/mL (ref 2.3–4.2)

## 2024-11-11 LAB — T4, FREE: Free T4: 0.64 ng/dL (ref 0.60–1.60)

## 2024-11-11 NOTE — Patient Instructions (Addendum)
 Please call the location of your choice from the menu below to schedule your Bone Density appointment.      [facility] at [facility]   Phone:  [phone]   [address]                                                                            [place], [place] [address]                                            Services: 3D Mammogram and Bone [facility] at [facility])  Phone:  [phone]   [address]                        [place], [place] [address]                                              Services:  3D Mammogram and Bone Density

## 2024-11-11 NOTE — Assessment & Plan Note (Signed)
 New, possible hypothyroidism. Will evaluate with TSH, free T3 and free T4.

## 2024-11-11 NOTE — Assessment & Plan Note (Signed)
 Chronic, neurology, Dr. Evonnie does not feel that he has Parkinson's or parkinsonism.  This previous diagnosis made by prior MD has been removed.  He is no longer on carbidopa  levodopa . Possible symptoms due to peripheral neuropathy. ? Unclear cause of leg weakness. Encouraged patient to increase B12 with daily B12 supplement of 1000 mcg daily. Will treat thyroid issue if present.  May need referral for tertiary evaluation depending on Dr. Lenon recommendations.

## 2024-11-11 NOTE — Progress Notes (Signed)
 "   Patient ID: [name], male    DOB: 09-Jun-1946, 78 y.o.   MRN: [id number]  This visit was conducted in person.  BP 102/60   Pulse 81   Temp 98.2 F (36.8 C) (Oral)   Ht 5' 9 (1.753 m)   Wt 247 lb 8 oz (112.3 kg)   SpO2 95%   BMI 36.55 kg/m    CC:  Chief Complaint  Patient presents with   Lab Results    Discuss labs from Dr. Evonnie    Subjective:   HPI: [name] is a 78 y.o. male presenting on 11/11/2024 for Lab Results (Discuss labs from Dr. Evonnie)    Recent labs at Neurologist showed elevated TSH 6.23     Mother with thyroid issues.. possible hyperthyroid.  Not on Biotin.  Not colder,  fatigue pretty good. No recent weight gain. Bikes 30-45 min on stationary bike.   No personal thyroid issues.   Mother with osteoporosis.  Mild osteopenia seen on 06/02/2024 lumbar plain film  Relevant past medical, surgical, family and social history reviewed and updated as indicated. Interim medical history since our last visit reviewed. Allergies and medications reviewed and updated. Outpatient Medications Prior to Visit  Medication Sig Dispense Refill   furosemide  (LASIX ) 20 MG tablet Take 10 mg by mouth daily as needed for fluid or edema.     ASPIRIN 81 PO Take by mouth daily.     cetirizine  (ZYRTEC  ALLERGY) 10 MG tablet Take 1 tablet (10 mg total) by mouth daily. 90 tablet 3   Coenzyme Q10 (COQ10 PO) Take by mouth. 1 per day     glucosamine-chondroitin 500-400 MG tablet Take 1 tablet by mouth daily.     ketoconazole  (NIZORAL ) 2 % cream Apply 1 Application topically daily. 60 g 0   ketoconazole  (NIZORAL ) 2 % shampoo Apply 1 Application topically 2 (two) times a week.     metoprolol  succinate (TOPROL -XL) 25 MG 24 hr tablet Take 1 tablet (25 mg total) by mouth daily. 90 tablet 3   metroNIDAZOLE (METROCREAM) 0.75 % cream Apply 1 Application topically daily.     Multiple Vitamins-Minerals (MULTIVITAMIN WITH MINERALS) tablet Take 1 tablet by mouth daily.     mupirocin  ointment (BACTROBAN) 2 % Apply topically as needed.     nystatin  (MYCOSTATIN /NYSTOP ) powder Apply 1 Application topically 3 (three) times daily. 15 g 0   rosuvastatin  (CRESTOR ) 10 MG tablet Take 1 tablet by mouth once daily 90 tablet 3   furosemide  (LASIX ) 20 MG tablet Take 0.5 tablets (10 mg total) by mouth daily. (Patient taking differently: Take 10 mg by mouth daily. As needed) 45 tablet 3   No facility-administered medications prior to visit.     Per HPI unless specifically indicated in ROS section below Review of Systems  Constitutional:  Negative for fatigue and fever.  HENT:  Negative for ear pain.   Eyes:  Negative for pain.  Respiratory:  Negative for cough and shortness of breath.   Cardiovascular:  Negative for chest pain, palpitations and leg swelling.  Gastrointestinal:  Negative for abdominal pain.  Genitourinary:  Negative for dysuria.  Musculoskeletal:  Negative for arthralgias.  Neurological:  Negative for syncope, light-headedness and headaches.  Psychiatric/Behavioral:  Negative for dysphoric mood.    Objective:  BP 102/60   Pulse 81   Temp 98.2 F (36.8 C) (Oral)   Ht 5' 9 (1.753 m)   Wt 247 lb 8 oz (112.3 kg)   SpO2 95%  BMI 36.55 kg/m   Wt Readings from Last 3 Encounters:  11/11/24 247 lb 8 oz (112.3 kg)  10/07/24 247 lb 6.4 oz (112.2 kg)  08/19/24 244 lb 9.6 oz (110.9 kg)      Physical Exam Constitutional:      Appearance: He is well-developed.     Comments: Using rolling walker  HENT:     Head: Normocephalic.     Right Ear: Hearing normal.     Left Ear: Hearing normal.     Nose: Nose normal.  Neck:     Thyroid: No thyroid mass or thyromegaly.     Vascular: No carotid bruit.     Trachea: Trachea normal.  Cardiovascular:     Rate and Rhythm: Normal rate and regular rhythm.     Pulses: Normal pulses.     Heart sounds: Heart sounds not distant. No murmur heard.    No friction rub. No gallop.     Comments: No peripheral edema Pulmonary:      Effort: Pulmonary effort is normal. No respiratory distress.     Breath sounds: Normal breath sounds.  Skin:    General: Skin is warm and dry.     Findings: No rash.  Neurological:     Cranial Nerves: Cranial nerves 2-12 are intact.     Sensory: Sensation is intact.     Motor: Motor function is intact.     Coordination: Coordination abnormal.     Gait: Gait abnormal.  Psychiatric:        Speech: Speech normal.        Behavior: Behavior normal.        Thought Content: Thought content normal.       Results for orders placed or performed in visit on 10/07/24  Hemoglobin A1c   Collection Time: 10/07/24  8:57 AM  Result Value Ref Range   Hgb A1c MFr Bld 5.7 (H) <5.7 %   Mean Plasma Glucose 117 mg/dL   eAG (mmol/L) 6.5 mmol/L  B12 and Folate Panel   Collection Time: 10/07/24  8:57 AM  Result Value Ref Range   Vitamin B-12 367 200 - 1,100 pg/mL   Folate 16.0 ng/mL  TSH   Collection Time: 10/07/24  8:57 AM  Result Value Ref Range   TSH 6.23 (H) 0.40 - 4.50 mIU/L  RPR W/RFLX TO RPR TITER, TREPONEMAL AB, SCREEN AND DIAGNOSIS   Collection Time: 10/07/24  8:57 AM  Result Value Ref Range   RPR Ser Ql NON-REACTIVE NON-REACTIVE  Protein electrophoresis, serum   Collection Time: 10/07/24  8:57 AM  Result Value Ref Range   Total Protein 7.0 6.1 - 8.1 g/dL   Albumin ELP 3.9 3.8 - 4.8 g/dL   Alpha 1 0.2 0.2 - 0.3 g/dL   Alpha 2 0.9 0.5 - 0.9 g/dL   Beta Globulin 0.5 0.4 - 0.6 g/dL   Beta 2 0.5 0.2 - 0.5 g/dL   Gamma Globulin 1.0 0.8 - 1.7 g/dL   SPE Interp.    Immunofixation electrophoresis   Collection Time: 10/07/24  8:57 AM  Result Value Ref Range   Immunofix Electr Int     Immunoglobulin A 431 (H) 70 - 320 mg/dL   IgG (Immunoglobin G), Serum 985 600 - 1,540 mg/dL   IgM, Serum 74 50 - 699 mg/dL  Immunofixation interpretive, urine   Collection Time: 10/07/24 10:31 AM  Result Value Ref Range   Interpretation    Protein Electrophoresis,Random Urn   Collection Time: 10/07/24  10:31  AM  Result Value Ref Range   Creatinine, Urine 65 20 - 320 mg/dL   Protein/Creat Ratio 77 25 - 148 mg/g creat   Protein/Creatinine Ratio 0.077 0.025 - 0.148 mg/mg creat   Total Protein, Urine 5 5 - 25 mg/dL   Albumin  %   Joeyj-8-Honalopw, U NOTE %   Alpha-2-Globulin, U NOTE %   Beta Globulin, U NOTE %   Gamma Globulin, U NOTE %   Interpretation      Assessment and Plan  Elevated TSH Assessment & Plan: New, possible hypothyroidism. Will evaluate with TSH, free T3 and free T4.   Orders: -     T3, free -     T4, free -     TSH  Osteopenia of lumbar spine Assessment & Plan: New, noted on plain film.  Recommend definitive diagnosis with bone density.  Recommended calcium  in diet, vitamin D 1000 international units daily and regular weightbearing exercise.  Orders: -     DG Bone Density; Future  Leg weakness, bilateral  Gait instability Assessment & Plan: Chronic, neurology, Dr. Evonnie does not feel that he has Parkinson's or parkinsonism.  This previous diagnosis made by prior MD has been removed.  He is no longer on carbidopa  levodopa . Possible symptoms due to peripheral neuropathy. ? Unclear cause of leg weakness. Encouraged patient to increase B12 with daily B12 supplement of 1000 mcg daily. Will treat thyroid issue if present.  May need referral for tertiary evaluation depending on Dr. Lenon recommendations.     No follow-ups on file.   Greig Ring, MD  "

## 2024-11-11 NOTE — Assessment & Plan Note (Signed)
 New, noted on plain film.  Recommend definitive diagnosis with bone density.  Recommended calcium  in diet, vitamin D 1000 international units daily and regular weightbearing exercise.

## 2024-11-14 ENCOUNTER — Other Ambulatory Visit: Payer: Self-pay | Admitting: Family Medicine

## 2024-11-14 MED ORDER — LEVOTHYROXINE SODIUM 25 MCG PO TABS: 25.0000 ug | ORAL_TABLET | Freq: Every day | ORAL | 11 refills | Status: AC

## 2024-11-14 NOTE — Addendum Note (Signed)
 Addended by: AVELINA NO E on: 11/14/2024 03:29 PM   Modules accepted: Orders

## 2024-11-14 NOTE — Telephone Encounter (Signed)
 Copied from CRM #[id number]. Topic: Clinical - Lab/Test Results >> Nov 12, 2024  2:43 PM [name] wrote: Reason for CRM: Pt's wife is calling to inform Dr.Bedsole that the pt has agreed to starting the thyroid medication and would like a callback regarding this on Friday.    ----------------------------------------------------------------------- From previous Reason for Contact - Medication Question: Reason for CRM: tsh results

## 2024-11-18 ENCOUNTER — Ambulatory Visit: Attending: Family Medicine | Admitting: Physical Therapy

## 2024-11-18 DIAGNOSIS — M21372 Foot drop, left foot: Secondary | ICD-10-CM | POA: Insufficient documentation

## 2024-11-18 DIAGNOSIS — R2689 Other abnormalities of gait and mobility: Secondary | ICD-10-CM | POA: Insufficient documentation

## 2024-11-18 DIAGNOSIS — M6281 Muscle weakness (generalized): Secondary | ICD-10-CM | POA: Insufficient documentation

## 2024-11-18 DIAGNOSIS — R293 Abnormal posture: Secondary | ICD-10-CM | POA: Insufficient documentation

## 2024-11-18 DIAGNOSIS — M21371 Foot drop, right foot: Secondary | ICD-10-CM | POA: Insufficient documentation

## 2024-11-18 DIAGNOSIS — R2681 Unsteadiness on feet: Secondary | ICD-10-CM | POA: Insufficient documentation

## 2024-11-18 NOTE — Therapy (Signed)
 " OUTPATIENT PHYSICAL THERAPY NEURO TREATMENT   Patient Name: [name] MRN: [id number] DOB:[birth year], 79 y.o., male Today's Date: 11/18/2024   PCP: Avelina Greig BRAVO, MD REFERRING PROVIDER: Avelina Greig BRAVO, MD   END OF SESSION:  PT End of Session - 11/18/24 1320     Visit Number 13    Number of Visits 16    Date for Recertification  12/09/24    Authorization Type [employer]    Authorization Time Period 13 PT visits 09/18/2024 - 12/17/2024    PT Start Time 1319    PT Stop Time 1401    PT Time Calculation (min) 42 min    Equipment Utilized During Treatment Gait belt    Activity Tolerance Patient tolerated treatment well    Behavior During Therapy WFL for tasks assessed/performed              Past Medical History:  Diagnosis Date   Agatston coronary artery calcium  score greater than 400    Coronary calcium  score of 533. This was 69th percentile for age,   Allergy 15 yrs. or more   mosquito or wasp bites bad reaction   Aortic atherosclerosis    Arrhythmia    Cataract 10 yrs. or more   slow bloom checkup every 6 mos.   Hyperlipidemia LDL goal <70    Neuromuscular disorder (HCC) 09/2018   Parkinsons   Parkinson disease (HCC)    Sleep apnea 02/21/2021 sleep study   have Resair machine   Past Surgical History:  Procedure Laterality Date   TONSILLECTOMY     WISDOM TOOTH EXTRACTION     Patient Active Problem List   Diagnosis Date Noted   Elevated TSH 11/11/2024   Osteopenia of lumbar spine 11/11/2024   Leg weakness, bilateral 11/11/2024   Gait instability 06/23/2024   Scoliosis 06/23/2024   Agatston coronary artery calcium  score greater than 400 02/02/2022   Aortic atherosclerosis 02/01/2022   SVT (supraventricular tachycardia) 01/12/2022   OSA (obstructive sleep apnea) 01/12/2022   Edema of right lower leg 11/25/2020   Hyperlipidemia LDL goal <70 11/25/2020   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 Denies pain or acute changes. HEP is still challenging, goblet sit to stands are the most challenging.    Pt accompanied by: Wife, [name]   PERTINENT HISTORY:  HLD, cellulitis, lymphedema, low back pain, hearing loss, (undiagnosed w/PD in 2025)  PAIN:  Are you having pain? No  PRECAUTIONS: Fall  RED FLAGS: None   WEIGHT BEARING RESTRICTIONS: No  FALLS: Has patient fallen in last 6 months? No  LIVING ENVIRONMENT: Lives with: lives with their spouse Lives in: House/apartment Stairs: Yes: External: 2-3 steps; on right going up, on left going up, and can reach both Has following equipment at home: Walker - 4 wheeled, shower chair, Ramped entry, and StrongArm cane  PLOF: Requires assistive device for independence  PATIENT GOALS: I want to be able to stand up and walk without this thing (rollator) and strengthen this thing (RLE)   OBJECTIVE:  Note: Objective measures were completed at Evaluation unless otherwise noted.  DIAGNOSTIC FINDINGS: MRI of C-spine from 06/05/24  IMPRESSION: 1. Cervical spondylosis as outlined within the body of the report. 2. No significant spinal canal stenosis. 3. Multilevel foraminal stenosis, greatest on the right at C5-C6 (moderate at this site). 4. No more than mild disc degeneration within the cervical spine. 5. Multilevel facet  arthropathy, greatest on the left at C4-C5 (advanced at this site). 6. Facet ankylosis on the left at C3-C4. 7. Mild grade 1 anterolisthesis at C4-C5 and C5-C6.  MRI of lumbar spine from 04/30/24 IMPRESSION: 1. 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 2. 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 3. Mild spinal canal stenosis at L2-3. 4. No spinal canal stenosis or neural foraminal narrowing in the thoracic spine.  MRI of T-spine 04/30/24 IMPRESSION: 1. 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 2. 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 3. Mild spinal canal stenosis at L2-3. 4. No spinal canal stenosis or neural foraminal narrowing in the thoracic spine.  NCV w/EMG from 06/30/24  NCV & EMG Findings: Extensive electrodiagnostic evaluation of bilateral lower limbs shows: Bilateral sural and superficial peroneal/fibular sensory responses are absent. Bilateral peroneal/fibular (EDB) and tibial (AH) motor responses are absent. Bilateral peroneal/fibular (TA) motor responses show reduced amplitude (L1.69, R1.67 mV). Bilateral H reflex latencies are absent. Chronic motor axon loss changes WITH active denervation changes are seen in bilateral tibialis anterior and bilateral medial head of gastrocnemius muscles.   Impression: This is an abnormal study. 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 The findings are at least moderate in degree electrically. No definitive electrodiagnostic evidence of a right or left lumbosacral (L3-S1) motor radiculopathy.  COGNITION: Overall cognitive status: Within  functional limits for tasks assessed   SENSATION: Pt denies numbness/tingling in all extremities    EDEMA: Pt has chronic swelling of distal RLE    POSTURE: rounded shoulders, forward head, increased thoracic kyphosis, and flexed trunk   LOWER EXTREMITY ROM:     Active  Right Eval Left Eval  Hip flexion    Hip extension    Hip abduction    Hip adduction    Hip internal rotation    Hip external rotation    Knee flexion    Knee extension  Ankle dorsiflexion    Ankle plantarflexion    Ankle inversion    Ankle eversion     (Blank rows = not tested)  LOWER EXTREMITY MMT:  Tested in seated position   MMT Right Eval Left Eval  Hip flexion 5 5  Hip extension    Hip abduction 5 5  Hip adduction 5 5  Hip internal rotation    Hip external rotation    Knee flexion 5 5  Knee extension 5 5  Ankle dorsiflexion 5 5  Ankle plantarflexion    Ankle inversion    Ankle eversion    (Blank rows = not tested)  BED MOBILITY:  Not tested Independent per pt   TRANSFERS: Sit to stand: Modified independence  Assistive device utilized: None     Stand to sit: Modified independence  Assistive device utilized: None      RAMP:  Not tested  CURB:  Not tested  STAIRS: Not tested GAIT: Gait pattern: Bilateral genu valgus, excessive R ankle inversion/pronation, step through pattern, decreased step length- Left, decreased stance time- Right, decreased stride length, decreased hip/knee flexion- Right, decreased ankle dorsiflexion- Right, decreased ankle dorsiflexion- Left, lateral hip instability, lateral lean- Right, decreased trunk rotation, trunk flexed, and narrow BOS Distance walked: Various short clinic distances  Assistive device utilized: Walker - 4 wheeled Level of assistance: SBA Comments: Pt ambulates w/excessive thoracic kyphosis and forward flexed posture.    FUNCTIONAL TESTS:                                                                                                                                    TREATMENT:   Ther Act  From mat table while seated on airex (22.5 height), sit to stands w/no UE support x5 reps to review from HEP. Pt demonstrated proper technique (using NDT principles) and premature knee extension not noted. Progressed to holding 10# KB in front rack position for improved quad/glute strength, transfers and core stability. Pt performed 1x8 reps, w/most completed on first attempt. With fatigue, pt does require cues to facilitate anterior lean.  Ended w/ 6 sit to stands from 20 height w/o UE support for BLE strength while fatigued. No instability noted.  In // bars, single leg step ups to 6 step, x6 reps per side unweighted and progressed to 8 reps per side holding 10# KB in contralateral hand for improved single leg stability, glute med strength and eccentric quad control. Increased difficulty performing on LLE. CGA throughout. Min cues for pt to look into mirror at top of rep to promote full hip extension and upright posture.  At ballet bar, Spanish squats from 18 chair height using red resistance band w/BUE support on ballet bar, x10 reps. Pt unable to perform without BUE support, but was able to achieve full knee extension. Progressed to 8 reps while seated on airex (20.5 height) w/BUE support for added BLE strength and endurance.  PATIENT EDUCATION: Education details:  Continue HEP  Person educated: Patient Education method: Explanation, Demonstration, Actor cues, and Verbal cues Education comprehension: verbalized understanding, returned demonstration, verbal cues required, tactile cues required, and needs further education  HOME EXERCISE PROGRAM:  Access Code: [email or web address] URL: [email or web address] Date: 09/23/2024 Prepared by: Marlon Trivia Heffelfinger  Exercises - Wall push up with strong push away  - 1 x daily - 7 x weekly - 3 sets - 10 reps - Dead Bug  - 1 x daily - 7 x weekly - 3 sets - 10 reps - Correct  Standing Posture  - 1 x daily - 5 x weekly - 30 hold - Standing Balance with Eyes Closed on Foam  - 1 x daily - 5 x weekly - 3 sets - 30 hold - Standing march with overhead hold   - 1 x daily - 7 x weekly - 3 sets - 10 reps - Seated Ankle Alphabet  - 1 x daily - 7 x weekly - 3 sets - 10 reps - Ankle Inversion with Resistance  - 1 x daily - 7 x weekly - 3 sets - 10 reps - Ankle Eversion with Resistance  - 1 x daily - 7 x weekly - 3 sets - 10 reps - Ankle Dorsiflexion with Resistance  - 1 x daily - 7 x weekly - 3 sets - 10 reps - Ankle and Toe Plantarflexion with Resistance  - 1 x daily - 7 x weekly - 3 sets - 10 reps - Calf stretch  - 1 x daily - 7 x weekly - 3 sets - 10 reps - Seated Cervical Sidebending Stretch  - 1 x daily - 7 x weekly - 1 sets - 4 reps - 30 second hold - Single Arm Doorway Pec Stretch at 90 Degrees Abduction  - 1 x daily - 7 x weekly - 2-3 reps - 30-60 seconds  hold - Static Prone on Elbows  - 1 x daily - 7 x weekly - 2 sets - 10 reps - Supine Bridge  - 1 x daily - 7 x weekly - 3 sets - 10 reps - 3-5 seconds hold (can perform in B-stance)  - Clamshell  - 1 x daily - 7 x weekly - 3 sets - 10 reps - 2-3 seconds  hold - Seated Deadlifts (see pt instructions) - Side Stepping with Resistance at Ankles and Counter Support  - 1 x daily - 7 x weekly - Bird Dog  - 1 x daily - 7 x weekly - 3 sets - 10 reps - Serratus Activation at Wall with Foam Roll  - 1 x daily - 7 x weekly - 3 sets - 10 reps - Seated Shoulder Horizontal Abduction with Resistance  - 1 x daily - 7 x weekly - 1-2 sets - 10 reps - Prone Scapular Retraction Y  - 1 x daily - 7 x weekly - 3 sets - 10 reps - changed to standing on 12/11  - Prone Scapular Retraction Arms at Side  - 1 x daily - 7 x weekly - 3 sets - 10 reps - Prone Shoulder Extension  - 1 x daily - 7 x weekly - 3 sets - 10 reps - Supine Bridge with Heels on Swiss Ball and Knees Bent  - 1 x daily - 7 x weekly - 3 sets - 10 reps  GOALS: Goals reviewed with  patient? Yes  SHORT TERM GOALS: Target date: 10/16/2024    Pt will improve 5  x STS to less than or equal to 20 seconds without BUE support to demonstrate improved functional strength and transfer efficiency.   Baseline: 22.75s w/BUE support; 30.19s w/BUE support (12/4) Goal status: NOT MET  2.  Pt will improve gait velocity to at least 2.95 ft/s w/LRAD for improved gait efficiency and independence    Baseline: 2.81 ft/s w/rollator (11/11); 2.7 ft/s w/rollator (12/4) Goal status: NOT MET   3.  to be assessed and LTG updated  Baseline:  Goal status: MET  4.  FGA to be assessed and LTG updated  Baseline:  Goal status: DC  5.  Pt will lift 20# weight from ground using proper body mechanics for improved posterior chain strength and stability w/lifting.  Baseline:  Goal status: MET    LONG TERM GOALS: Target date: 10/30/2024    Pt will improve 5 x STS to less than or equal to 25 seconds without UE support to demonstrate improved functional strength and transfer efficiency.   Baseline: 22.75s w/BUE support; 30.19s w/BUE support (12/4); 32.35s w/BUE support Goal status: IN PROGRESS   2.  Pt will improve gait velocity to at least 3.1 ft/s w/LRAD for improved gait efficiency and endurance   Baseline: 2.81 ft/s w/rollator (11/11); 2.7 ft/s w/rollator (12/4); 2.91 ft/s w/rollator (12/18)  Goal status: NOT MET    3.  Pt will ambulate greater than or equal to 1250 feet on with LARD and improved postural control for improved cardiovascular endurance and BLE strength.   Baseline: 1,013' w/rollator (11/11) Goal status: IN PROGRESS   4.  FGA goal  Baseline:  Goal status: DC  5.  Pt will lift 30# weight from ground using proper body mechanics for improved posterior chain strength and stability w/lifting.  Baseline:  Goal status: IN PROGRESS   NEW LONG TERM GOALS:  Target date: 12/09/2024  Pt will improve 5 x STS to less than or equal to 28 seconds from 18 height  without UE support to demonstrate improved functional strength and transfer efficiency.  Baseline: 32.35s w/BUE support (12/18) Goal status: INITIAL  2.  Pt will lift 30# weight from ground using proper body mechanics for improved posterior chain strength and stability w/lifting.  Baseline:  Goal status: IN PROGRESS  3.  Pt will ambulate greater than or equal to 1250 feet on with LARD and improved postural control for improved cardiovascular endurance and BLE strength.  Baseline: 1,013' w/rollator (11/11) Goal status: IN PROGRESS  ASSESSMENT:  CLINICAL IMPRESSION: Emphasis of skilled PT session on functional BLE strength, transfers and single leg stability. Pt continues to be challenged by sit to stands w/no UE support, especially from surfaces <20. However, pt demonstrates significant improvement in postural control and anterior weight shifting compared to previous sessions. Pt challenged by Spanish squats but performed well w/cues to shift forward, so will continue to progress in PT.  Continue POC.    OBJECTIVE IMPAIRMENTS: Abnormal gait, decreased activity tolerance, decreased balance, decreased endurance, decreased knowledge of condition, decreased mobility, difficulty walking, decreased strength, improper body mechanics, and postural dysfunction  ACTIVITY LIMITATIONS: carrying, lifting, bending, squatting, stairs, transfers, locomotion level, and caring for others  PARTICIPATION LIMITATIONS: cleaning, interpersonal relationship, driving, shopping, community activity, and yard work  PERSONAL FACTORS: 1 comorbidity: bilateral peripheral neuropathy are also affecting patient's functional outcome.   REHAB POTENTIAL: Good  CLINICAL DECISION MAKING: Evolving/moderate complexity  EVALUATION COMPLEXITY: Moderate  PLAN:  PT FREQUENCY: 1-2x/week (recert)   PT DURATION: 6 weeks + 4 weeks (recert)  PLANNED INTERVENTIONS: 97164- PT Re-evaluation, 97750- Physical Performance  Testing, 97110-Therapeutic exercises, 97530- Therapeutic activity, [id number]- Neuromuscular re-education, 97535- Self Care, [id number]- Manual therapy, [id number]- Gait training, [phone]- Orthotic Initial, [phone]- Orthotic/Prosthetic subsequent, [phone]- Aquatic Therapy, [phone]- Electrical stimulation (manual), [phone] (1-2 muscles), [id number] (3+ muscles)- Dry Needling, Patient/Family education, Balance training, Stair training, Joint mobilization, Spinal mobilization, Vestibular training, and DME instructions  PLAN FOR NEXT SESSION:  Review HEP. Work on lifting, sit to stands w/reduced UE reliance, leg presses, functional hip strength, glute med strength, modified RDLs, prone I/Y/T, core stability, tall kneel tasks, spanish squats, step ups    Marlon BRAVO Thereasa Iannello, PT, DPT 11/18/2024, 2:02 PM        "

## 2024-11-25 ENCOUNTER — Ambulatory Visit: Admitting: Physical Therapy

## 2024-11-25 DIAGNOSIS — R293 Abnormal posture: Secondary | ICD-10-CM

## 2024-11-25 DIAGNOSIS — M6281 Muscle weakness (generalized): Secondary | ICD-10-CM

## 2024-11-25 DIAGNOSIS — R2681 Unsteadiness on feet: Secondary | ICD-10-CM

## 2024-11-25 DIAGNOSIS — R2689 Other abnormalities of gait and mobility: Secondary | ICD-10-CM

## 2024-11-25 NOTE — Therapy (Addendum)
 " OUTPATIENT PHYSICAL THERAPY NEURO TREATMENT   Patient Name: [name] MRN: [id number] DOB:January 13, 1946, 79 y.o., male Today's Date: 11/25/2024   PCP: Donald Greig BRAVO, MD REFERRING PROVIDER: Avelina Greig BRAVO, MD   END OF SESSION:  PT End of Session - 11/25/24 1356     Visit Number 14    Number of Visits 16    Date for Recertification  12/09/24    Authorization Type [employer]    Authorization Time Period 17 PT visits 09/18/2024 - 12/17/2024    PT Start Time 1354    PT Stop Time 1444    PT Time Calculation (min) 50 min    Equipment Utilized During Treatment Gait belt    Activity Tolerance Patient tolerated treatment well    Behavior During Therapy WFL for tasks assessed/performed              Past Medical History:  Diagnosis Date   Agatston coronary artery calcium  score greater than 400    Coronary calcium  score of 533. This was 69th percentile for age,   Allergy 15 yrs. or more   mosquito or wasp bites bad reaction   Aortic atherosclerosis    Arrhythmia    Cataract 10 yrs. or more   slow bloom checkup every 6 mos.   Hyperlipidemia LDL goal <70    Neuromuscular disorder (HCC) 09/2018   Parkinsons   Parkinson disease (HCC)    Sleep apnea 02/21/2021 sleep study   have Resair machine   Past Surgical History:  Procedure Laterality Date   TONSILLECTOMY     WISDOM TOOTH EXTRACTION     Patient Active Problem List   Diagnosis Date Noted   Elevated TSH 11/11/2024   Osteopenia of lumbar spine 11/11/2024   Leg weakness, bilateral 11/11/2024   Gait instability 06/23/2024   Scoliosis 06/23/2024   Agatston coronary artery calcium  score greater than 400 02/02/2022   Aortic atherosclerosis 02/01/2022   SVT (supraventricular tachycardia) 01/12/2022   OSA (obstructive sleep apnea) 01/12/2022   Edema of right lower leg 11/25/2020   Hyperlipidemia LDL goal <70 11/25/2020   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 States his L foot is going out on him today. Unsure how to describe it, but if he did not have an AD, he would fall. Denies pain or falls. HEP is going well.    Pt accompanied by: Wife, [name] in lobby   PERTINENT HISTORY:  HLD, cellulitis, lymphedema, low back pain, hearing loss, (undiagnosed w/PD in 2025)  PAIN:  Are you having pain? No  PRECAUTIONS: Fall  RED FLAGS: None   WEIGHT BEARING RESTRICTIONS: No  FALLS: Has patient fallen in last 6 months? No  LIVING ENVIRONMENT: Lives with: lives with their spouse Lives in: House/apartment Stairs: Yes: External: 2-3 steps; on right going up, on left going up, and can reach both Has following equipment at home: Walker - 4 wheeled, shower chair, Ramped entry, and StrongArm cane  PLOF: Requires assistive device for independence  PATIENT GOALS: I want to be able to stand up and walk without this thing (rollator) and strengthen this thing (RLE)   OBJECTIVE:  Note: Objective measures were completed at Evaluation unless otherwise noted.  DIAGNOSTIC FINDINGS: MRI of C-spine from 06/05/24  IMPRESSION: 1. Cervical spondylosis as outlined within the body of the report. 2. No significant spinal canal stenosis. 3. Multilevel foraminal stenosis, greatest on the right at C5-C6 (moderate at this site). 4. No more than mild disc  degeneration within the cervical spine. 5. Multilevel facet arthropathy, greatest on the left at C4-C5 (advanced at this site). 6. Facet ankylosis on the left at C3-C4. 7. Mild grade 1 anterolisthesis at C4-C5 and C5-C6.  MRI of lumbar spine from 04/30/24 IMPRESSION: 1. 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 2. 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 3. Mild spinal canal stenosis at L2-3. 4. No spinal canal stenosis or neural foraminal narrowing in the thoracic spine.  MRI of T-spine 04/30/24 IMPRESSION: 1. 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 2. 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 3. Mild spinal canal stenosis at L2-3. 4. No spinal canal stenosis or neural foraminal narrowing in the thoracic spine.  NCV w/EMG from 06/30/24  NCV & EMG Findings: Extensive electrodiagnostic evaluation of bilateral lower limbs shows: Bilateral sural and superficial peroneal/fibular sensory responses are absent. Bilateral peroneal/fibular (EDB) and tibial (AH) motor responses are absent. Bilateral peroneal/fibular (TA) motor responses show reduced amplitude (L1.69, R1.67 mV). Bilateral H reflex latencies are absent. Chronic motor axon loss changes WITH active denervation changes are seen in bilateral tibialis anterior and bilateral medial head of gastrocnemius muscles.   Impression: This is an abnormal study. 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 The findings are at least moderate in degree electrically. No definitive electrodiagnostic evidence of a right or left lumbosacral (L3-S1) motor  radiculopathy.  COGNITION: Overall cognitive status: Within functional limits for tasks assessed   SENSATION: Pt denies numbness/tingling in all extremities    EDEMA: Pt has chronic swelling of distal RLE    POSTURE: rounded shoulders, forward head, increased thoracic kyphosis, and flexed trunk   LOWER EXTREMITY ROM:     Active  Right Eval Left Eval  Hip flexion    Hip extension    Hip abduction    Hip adduction    Hip internal rotation  Hip external rotation    Knee flexion    Knee extension    Ankle dorsiflexion    Ankle plantarflexion    Ankle inversion    Ankle eversion     (Blank rows = not tested)  LOWER EXTREMITY MMT:  Tested in seated position   MMT Right Eval Left Eval  Hip flexion 5 5  Hip extension    Hip abduction 5 5  Hip adduction 5 5  Hip internal rotation    Hip external rotation    Knee flexion 5 5  Knee extension 5 5  Ankle dorsiflexion 5 5  Ankle plantarflexion    Ankle inversion    Ankle eversion    (Blank rows = not tested)  BED MOBILITY:  Not tested Independent per pt   TRANSFERS: Sit to stand: Modified independence  Assistive device utilized: None     Stand to sit: Modified independence  Assistive device utilized: None      RAMP:  Not tested  CURB:  Not tested  STAIRS: Not tested GAIT: Gait pattern: Bilateral genu valgus, excessive R ankle inversion/pronation, step through pattern, decreased step length- Left, decreased stance time- Right, decreased stride length, decreased hip/knee flexion- Right, decreased ankle dorsiflexion- Right, decreased ankle dorsiflexion- Left, lateral hip instability, lateral lean- Right, decreased trunk rotation, trunk flexed, and narrow BOS Distance walked: Various short clinic distances  Assistive device utilized: Walker - 4 wheeled Level of assistance: SBA Comments: Pt ambulates w/excessive thoracic kyphosis and forward flexed posture.    FUNCTIONAL TESTS:                                                                                                                                    TREATMENT:   Ther Act  For improved BLE strength, core stability, postural control, posterior chain strength and endurance:  From mat table (20 height), sit to stands w/no UE support x8 reps. Pt able to perform all but first rep on first attempt Progressed to holding 5# KB in front rack position, 2x5 reps, w/most completed on first attempt. RPE of 7-8/10 w/activity.  From mat table w/airex under bottom (22.5 height), sit to stands holding 10# slam ball followed by ball slam and deadlift, 2x4 reps w/SBA. Min cues for full hip extension at top of rep.  At ballet bar, Spanish squats from 18 chair height using black resistance band w/BUE support on ballet bar, 2x10 reps. Pt unable to perform without BUE support, but was able to achieve full knee extension slowly. Progressed to 1x12 reps while seated on airex (20.5 height) w/BUE support for added BLE strength and endurance. Min cues for full hip/knee extension at top of rep.  SciFit ramp level 10.0 for 8 minutes using BLEs only for dynamic cardiovascular conditioning and BLE strength. RPE of 8-9/10 following activity.    PATIENT EDUCATION: Education details:  Continue HEP  Person educated: Patient Education method: Solicitor, Actor  cues, and Verbal cues Education comprehension: verbalized understanding, returned demonstration, verbal cues required, tactile cues required, and needs further education  HOME EXERCISE PROGRAM:  Access Code: [email or web address] URL: [email or web address] Date: 09/23/2024 Prepared by: Marlon Ragna Kramlich  Exercises - Wall push up with strong push away  - 1 x daily - 7 x weekly - 3 sets - 10 reps - Dead Bug  - 1 x daily - 7 x weekly - 3 sets - 10 reps - Correct Standing Posture  - 1 x daily - 5 x weekly - 30 hold - Standing Balance with Eyes Closed on Foam  - 1 x daily - 5 x weekly - 3 sets -  30 hold - Standing march with overhead hold   - 1 x daily - 7 x weekly - 3 sets - 10 reps - Seated Ankle Alphabet  - 1 x daily - 7 x weekly - 3 sets - 10 reps - Ankle Inversion with Resistance  - 1 x daily - 7 x weekly - 3 sets - 10 reps - Ankle Eversion with Resistance  - 1 x daily - 7 x weekly - 3 sets - 10 reps - Ankle Dorsiflexion with Resistance  - 1 x daily - 7 x weekly - 3 sets - 10 reps - Ankle and Toe Plantarflexion with Resistance  - 1 x daily - 7 x weekly - 3 sets - 10 reps - Calf stretch  - 1 x daily - 7 x weekly - 3 sets - 10 reps - Seated Cervical Sidebending Stretch  - 1 x daily - 7 x weekly - 1 sets - 4 reps - 30 second hold - Single Arm Doorway Pec Stretch at 90 Degrees Abduction  - 1 x daily - 7 x weekly - 2-3 reps - 30-60 seconds  hold - Static Prone on Elbows  - 1 x daily - 7 x weekly - 2 sets - 10 reps - Supine Bridge  - 1 x daily - 7 x weekly - 3 sets - 10 reps - 3-5 seconds hold (can perform in B-stance)  - Clamshell  - 1 x daily - 7 x weekly - 3 sets - 10 reps - 2-3 seconds  hold - Seated Deadlifts (see pt instructions) - Side Stepping with Resistance at Ankles and Counter Support  - 1 x daily - 7 x weekly - Bird Dog  - 1 x daily - 7 x weekly - 3 sets - 10 reps - Serratus Activation at Wall with Foam Roll  - 1 x daily - 7 x weekly - 3 sets - 10 reps - Seated Shoulder Horizontal Abduction with Resistance  - 1 x daily - 7 x weekly - 1-2 sets - 10 reps - Prone Scapular Retraction Y  - 1 x daily - 7 x weekly - 3 sets - 10 reps - changed to standing on 12/11  - Prone Scapular Retraction Arms at Side  - 1 x daily - 7 x weekly - 3 sets - 10 reps - Prone Shoulder Extension  - 1 x daily - 7 x weekly - 3 sets - 10 reps - Supine Bridge with Heels on Swiss Ball and Knees Bent  - 1 x daily - 7 x weekly - 3 sets - 10 reps  GOALS: Goals reviewed with patient? Yes  SHORT TERM GOALS: Target date: 10/16/2024    Pt will improve 5 x STS to less than or equal to 20 seconds without BUE  support to demonstrate improved  functional strength and transfer efficiency.   Baseline: 22.75s w/BUE support; 30.19s w/BUE support (12/4) Goal status: NOT MET  2.  Pt will improve gait velocity to at least 2.95 ft/s w/LRAD for improved gait efficiency and independence    Baseline: 2.81 ft/s w/rollator (11/11); 2.7 ft/s w/rollator (12/4) Goal status: NOT MET   3.  to be assessed and LTG updated  Baseline:  Goal status: MET  4.  FGA to be assessed and LTG updated  Baseline:  Goal status: DC  5.  Pt will lift 20# weight from ground using proper body mechanics for improved posterior chain strength and stability w/lifting.  Baseline:  Goal status: MET    LONG TERM GOALS: Target date: 10/30/2024    Pt will improve 5 x STS to less than or equal to 25 seconds without UE support to demonstrate improved functional strength and transfer efficiency.   Baseline: 22.75s w/BUE support; 30.19s w/BUE support (12/4); 32.35s w/BUE support Goal status: IN PROGRESS   2.  Pt will improve gait velocity to at least 3.1 ft/s w/LRAD for improved gait efficiency and endurance   Baseline: 2.81 ft/s w/rollator (11/11); 2.7 ft/s w/rollator (12/4); 2.91 ft/s w/rollator (12/18)  Goal status: NOT MET    3.  Pt will ambulate greater than or equal to 1250 feet on with LARD and improved postural control for improved cardiovascular endurance and BLE strength.   Baseline: 1,013' w/rollator (11/11) Goal status: IN PROGRESS   4.  FGA goal  Baseline:  Goal status: DC  5.  Pt will lift 30# weight from ground using proper body mechanics for improved posterior chain strength and stability w/lifting.  Baseline:  Goal status: IN PROGRESS   NEW LONG TERM GOALS:  Target date: 12/09/2024  Pt will improve 5 x STS to less than or equal to 28 seconds from 18 height without UE support to demonstrate improved functional strength and transfer efficiency.  Baseline: 32.35s w/BUE support (12/18) Goal status:  INITIAL  2.  Pt will lift 30# weight from ground using proper body mechanics for improved posterior chain strength and stability w/lifting.  Baseline:  Goal status: IN PROGRESS  3.  Pt will ambulate greater than or equal to 1250 feet on with LARD and improved postural control for improved cardiovascular endurance and BLE strength.  Baseline: 1,013' w/rollator (11/11) Goal status: IN PROGRESS  ASSESSMENT:  CLINICAL IMPRESSION: Emphasis of skilled PT session on functional BLE strength, posterior chain strength, transfers and postural control. Pt reports feeling as though his R foot was going out on him today, which would have resulted in a fall if he had not had his cane. Pt able to perform multiple sit to stands without UE support from 20 seat height today, most on first attempt, improved from 22.5 height. Pt continues to be limited by quad and hip abductor/extensor weakness, but has made significant improvements in PT and will continue to benefit from skilled PT services for reduced fall risk and improved independence w/mobility. Continue POC.    OBJECTIVE IMPAIRMENTS: Abnormal gait, decreased activity tolerance, decreased balance, decreased endurance, decreased knowledge of condition, decreased mobility, difficulty walking, decreased strength, improper body mechanics, and postural dysfunction  ACTIVITY LIMITATIONS: carrying, lifting, bending, squatting, stairs, transfers, locomotion level, and caring for others  PARTICIPATION LIMITATIONS: cleaning, interpersonal relationship, driving, shopping, community activity, and yard work  PERSONAL FACTORS: 1 comorbidity: bilateral peripheral neuropathy are also affecting patient's functional outcome.   REHAB POTENTIAL: Good  CLINICAL DECISION MAKING: Evolving/moderate complexity  EVALUATION COMPLEXITY: Moderate  PLAN:  PT FREQUENCY: 1-2x/week (recert)   PT DURATION: 6 weeks + 4 weeks (recert)   PLANNED INTERVENTIONS: 02835- PT  Re-evaluation, 97750- Physical Performance Testing, 97110-Therapeutic exercises, 97530- Therapeutic activity, [id number]- Neuromuscular re-education, 97535- Self Care, [id number]- Manual therapy, [id number]- Gait training, [phone]- Orthotic Initial, [phone]- Orthotic/Prosthetic subsequent, [phone]- Aquatic Therapy, [phone]- Electrical stimulation (manual), [phone] (1-2 muscles), [id number] (3+ muscles)- Dry Needling, Patient/Family education, Balance training, Stair training, Joint mobilization, Spinal mobilization, Vestibular training, and DME instructions  PLAN FOR NEXT SESSION:  Review HEP. Work on lifting, sit to stands w/reduced UE reliance, leg presses, functional hip strength, glute med strength, modified RDLs, prone I/Y/T, core stability, tall kneel tasks, spanish squats, step ups, Spanish squats w/black band    Marlon Bradley Tarrin Menn, PT, DPT 11/25/2024, 2:47 PM        "

## 2024-11-28 ENCOUNTER — Ambulatory Visit
Admission: RE | Admit: 2024-11-28 | Discharge: 2024-11-28 | Disposition: A | Discharge status: Home / Self Care | Source: Ambulatory Visit | Attending: Family Medicine | Admitting: Family Medicine

## 2024-11-28 DIAGNOSIS — M8588 Other specified disorders of bone density and structure, other site: Secondary | ICD-10-CM | POA: Insufficient documentation

## 2024-12-02 ENCOUNTER — Ambulatory Visit: Admitting: Physical Therapy

## 2024-12-02 DIAGNOSIS — R293 Abnormal posture: Secondary | ICD-10-CM

## 2024-12-02 DIAGNOSIS — R2681 Unsteadiness on feet: Secondary | ICD-10-CM

## 2024-12-02 DIAGNOSIS — R2689 Other abnormalities of gait and mobility: Secondary | ICD-10-CM

## 2024-12-02 DIAGNOSIS — M6281 Muscle weakness (generalized): Secondary | ICD-10-CM

## 2024-12-02 NOTE — Therapy (Signed)
 " OUTPATIENT PHYSICAL THERAPY NEURO TREATMENT   Patient Name: [name] MRN: [id number] DOB:1946/10/08, 79 y.o., male Today's Date: 12/02/2024   PCP: Avelina Greig BRAVO, MD REFERRING PROVIDER: Avelina Greig BRAVO, MD   END OF SESSION:  PT End of Session - 12/02/24 1317     Visit Number 15    Number of Visits 16    Date for Recertification  12/09/24    Authorization Type [employer]    Authorization Time Period 17 PT visits 09/18/2024 - 12/17/2024    PT Start Time 1315    PT Stop Time 1401    PT Time Calculation (min) 46 min    Equipment Utilized During Treatment Gait belt    Activity Tolerance Patient tolerated treatment well    Behavior During Therapy WFL for tasks assessed/performed               Past Medical History:  Diagnosis Date   Agatston coronary artery calcium  score greater than 400    Coronary calcium  score of 533. This was 69th percentile for age,   Allergy 15 yrs. or more   mosquito or wasp bites bad reaction   Aortic atherosclerosis    Arrhythmia    Cataract 10 yrs. or more   slow bloom checkup every 6 mos.   Hyperlipidemia LDL goal <70    Neuromuscular disorder (HCC) 09/2018   Parkinsons   Parkinson disease (HCC)    Sleep apnea 02/21/2021 sleep study   have Resair machine   Past Surgical History:  Procedure Laterality Date   TONSILLECTOMY     WISDOM TOOTH EXTRACTION     Patient Active Problem List   Diagnosis Date Noted   Elevated TSH 11/11/2024   Osteopenia of lumbar spine 11/11/2024   Leg weakness, bilateral 11/11/2024   Gait instability 06/23/2024   Scoliosis 06/23/2024   Agatston coronary artery calcium  score greater than 400 02/02/2022   Aortic atherosclerosis 02/01/2022   SVT (supraventricular tachycardia) 01/12/2022   OSA (obstructive sleep apnea) 01/12/2022   Edema of right lower leg 11/25/2020   Hyperlipidemia LDL goal <70 11/25/2020   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 States his L foot feels better today, did not bother him after PT last week. Still doing his HEP, still tough. No falls or pain.    Pt accompanied by: Self   PERTINENT HISTORY:  HLD, cellulitis, lymphedema, low back pain, hearing loss, (undiagnosed w/PD in 2025)  PAIN:  Are you having pain? No  PRECAUTIONS: Fall  RED FLAGS: None   WEIGHT BEARING RESTRICTIONS: No  FALLS: Has patient fallen in last 6 months? No  LIVING ENVIRONMENT: Lives with: lives with their spouse Lives in: House/apartment Stairs: Yes: External: 2-3 steps; on right going up, on left going up, and can reach both Has following equipment at home: Walker - 4 wheeled, shower chair, Ramped entry, and StrongArm cane  PLOF: Requires assistive device for independence  PATIENT GOALS: I want to be able to stand up and walk without this thing (rollator) and strengthen this thing (RLE)   OBJECTIVE:  Note: Objective measures were completed at Evaluation unless otherwise noted.  DIAGNOSTIC FINDINGS: MRI of C-spine from 06/05/24  IMPRESSION: 1. Cervical spondylosis as outlined within the body of the report. 2. No significant spinal canal stenosis. 3. Multilevel foraminal stenosis, greatest on the right at C5-C6 (moderate at this site). 4. No more than mild disc degeneration within the cervical spine. 5.  Multilevel facet arthropathy, greatest on the left at C4-C5 (advanced at this site). 6. Facet ankylosis on the left at C3-C4. 7. Mild grade 1 anterolisthesis at C4-C5 and C5-C6.  MRI of lumbar spine from 04/30/24 IMPRESSION: 1. 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 2. 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 3. Mild spinal canal stenosis at L2-3. 4. No spinal canal stenosis or neural foraminal narrowing in the thoracic spine.  MRI of T-spine 04/30/24 IMPRESSION: 1. 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 2. 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 3. Mild spinal canal stenosis at L2-3. 4. No spinal canal stenosis or neural foraminal narrowing in the thoracic spine.  NCV w/EMG from 06/30/24  NCV & EMG Findings: Extensive electrodiagnostic evaluation of bilateral lower limbs shows: Bilateral sural and superficial peroneal/fibular sensory responses are absent. Bilateral peroneal/fibular (EDB) and tibial (AH) motor responses are absent. Bilateral peroneal/fibular (TA) motor responses show reduced amplitude (L1.69, R1.67 mV). Bilateral H reflex latencies are absent. Chronic motor axon loss changes WITH active denervation changes are seen in bilateral tibialis anterior and bilateral medial head of gastrocnemius muscles.   Impression: This is an abnormal study. 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 The findings are at least moderate in degree electrically. No definitive electrodiagnostic evidence of a right or left lumbosacral (L3-S1) motor radiculopathy.  COGNITION: Overall cognitive  status: Within functional limits for tasks assessed   SENSATION: Pt denies numbness/tingling in all extremities    EDEMA: Pt has chronic swelling of distal RLE    POSTURE: rounded shoulders, forward head, increased thoracic kyphosis, and flexed trunk   LOWER EXTREMITY ROM:     Active  Right Eval Left Eval  Hip flexion    Hip extension    Hip abduction    Hip adduction    Hip internal rotation    Hip external rotation    Knee flexion  Knee extension    Ankle dorsiflexion    Ankle plantarflexion    Ankle inversion    Ankle eversion     (Blank rows = not tested)  LOWER EXTREMITY MMT:  Tested in seated position   MMT Right Eval Left Eval  Hip flexion 5 5  Hip extension    Hip abduction 5 5  Hip adduction 5 5  Hip internal rotation    Hip external rotation    Knee flexion 5 5  Knee extension 5 5  Ankle dorsiflexion 5 5  Ankle plantarflexion    Ankle inversion    Ankle eversion    (Blank rows = not tested)  BED MOBILITY:  Not tested Independent per pt   TRANSFERS: Sit to stand: Modified independence  Assistive device utilized: None     Stand to sit: Modified independence  Assistive device utilized: None      RAMP:  Not tested  CURB:  Not tested  STAIRS: Not tested GAIT: Gait pattern: Bilateral genu valgus, excessive R ankle inversion/pronation, step through pattern, decreased step length- Left, decreased stance time- Right, decreased stride length, decreased hip/knee flexion- Right, decreased ankle dorsiflexion- Right, decreased ankle dorsiflexion- Left, lateral hip instability, lateral lean- Right, decreased trunk rotation, trunk flexed, and narrow BOS Distance walked: Various short clinic distances  Assistive device utilized: Walker - 4 wheeled Level of assistance: SBA Comments: Pt ambulates w/excessive thoracic kyphosis and forward flexed posture.    FUNCTIONAL TESTS:                                                                                                                                    TREATMENT:   Ther Act/Ex  SciFit multi-peaks level 10.5 for 8 minutes using BLEs only for dynamic cardiovascular conditioning and BLE strength. RPE of 6-7/10 following activity.  For improved core stability, functional BLE strength and postural control:  Sit to stands from 20 mat height while holding 10# KB in goblet position, 2x5 reps. Mod multimodal cues to avoid premature knee extension as pt pushing himself backwards initially.  Leg presses w/min A required to place feet on foot plate:  Double leg presses, x10 reps at 60# then 12 reps at 100# Single leg presses at 60#, x8 reps per side. Good eccentric control noted bilaterally.  Spanish squats from blue chair at ballet bar w/black resistance band and 10# KB in goblet position, x10 reps. Performed w/airex in chair for added height (21.5).  Progressed to 8 reps from 21.5 height w/no added weight  Ended w/3 reps from chair height (19) w/no UE support. Pt required multiple attempts to perform due to fatigue and decreased anterior weight shift  RPE of 8/10 following session    PATIENT EDUCATION: Education details:  Continue HEP, informed pt that next week is last scheduled visit and to think about if he would like to recert vs DC Person educated: Patient Education method: Explanation, Demonstration,  Tactile cues, and Verbal cues Education comprehension: verbalized understanding, returned demonstration, verbal cues required, tactile cues required, and needs further education  HOME EXERCISE PROGRAM:  Access Code: [email or web address] URL: [email or web address] Date: 09/23/2024 Prepared by: Marlon Rolly Magri  Exercises - Wall push up with strong push away  - 1 x daily - 7 x weekly - 3 sets - 10 reps - Dead Bug  - 1 x daily - 7 x weekly - 3 sets - 10 reps - Correct Standing Posture  - 1 x daily - 5 x weekly - 30 hold - Standing Balance with Eyes Closed on Foam  - 1 x daily - 5 x  weekly - 3 sets - 30 hold - Standing march with overhead hold   - 1 x daily - 7 x weekly - 3 sets - 10 reps - Seated Ankle Alphabet  - 1 x daily - 7 x weekly - 3 sets - 10 reps - Ankle Inversion with Resistance  - 1 x daily - 7 x weekly - 3 sets - 10 reps - Ankle Eversion with Resistance  - 1 x daily - 7 x weekly - 3 sets - 10 reps - Ankle Dorsiflexion with Resistance  - 1 x daily - 7 x weekly - 3 sets - 10 reps - Ankle and Toe Plantarflexion with Resistance  - 1 x daily - 7 x weekly - 3 sets - 10 reps - Calf stretch  - 1 x daily - 7 x weekly - 3 sets - 10 reps - Seated Cervical Sidebending Stretch  - 1 x daily - 7 x weekly - 1 sets - 4 reps - 30 second hold - Single Arm Doorway Pec Stretch at 90 Degrees Abduction  - 1 x daily - 7 x weekly - 2-3 reps - 30-60 seconds  hold - Static Prone on Elbows  - 1 x daily - 7 x weekly - 2 sets - 10 reps - Supine Bridge  - 1 x daily - 7 x weekly - 3 sets - 10 reps - 3-5 seconds hold (can perform in B-stance)  - Clamshell  - 1 x daily - 7 x weekly - 3 sets - 10 reps - 2-3 seconds  hold - Seated Deadlifts (see pt instructions) - Side Stepping with Resistance at Ankles and Counter Support  - 1 x daily - 7 x weekly - Bird Dog  - 1 x daily - 7 x weekly - 3 sets - 10 reps - Serratus Activation at Wall with Foam Roll  - 1 x daily - 7 x weekly - 3 sets - 10 reps - Seated Shoulder Horizontal Abduction with Resistance  - 1 x daily - 7 x weekly - 1-2 sets - 10 reps - Prone Scapular Retraction Y  - 1 x daily - 7 x weekly - 3 sets - 10 reps - changed to standing on 12/11  - Prone Scapular Retraction Arms at Side  - 1 x daily - 7 x weekly - 3 sets - 10 reps - Prone Shoulder Extension  - 1 x daily - 7 x weekly - 3 sets - 10 reps - Supine Bridge with Heels on Swiss Ball and Knees Bent  - 1 x daily - 7 x weekly - 3 sets - 10 reps  GOALS: Goals reviewed with patient? Yes  SHORT TERM GOALS: Target date: 10/16/2024    Pt will improve 5 x STS to less than or equal to 20  seconds without BUE support to demonstrate  improved functional strength and transfer efficiency.   Baseline: 22.75s w/BUE support; 30.19s w/BUE support (12/4) Goal status: NOT MET  2.  Pt will improve gait velocity to at least 2.95 ft/s w/LRAD for improved gait efficiency and independence    Baseline: 2.81 ft/s w/rollator (11/11); 2.7 ft/s w/rollator (12/4) Goal status: NOT MET   3.  to be assessed and LTG updated  Baseline:  Goal status: MET  4.  FGA to be assessed and LTG updated  Baseline:  Goal status: DC  5.  Pt will lift 20# weight from ground using proper body mechanics for improved posterior chain strength and stability w/lifting.  Baseline:  Goal status: MET    LONG TERM GOALS: Target date: 10/30/2024    Pt will improve 5 x STS to less than or equal to 25 seconds without UE support to demonstrate improved functional strength and transfer efficiency.   Baseline: 22.75s w/BUE support; 30.19s w/BUE support (12/4); 32.35s w/BUE support Goal status: IN PROGRESS   2.  Pt will improve gait velocity to at least 3.1 ft/s w/LRAD for improved gait efficiency and endurance   Baseline: 2.81 ft/s w/rollator (11/11); 2.7 ft/s w/rollator (12/4); 2.91 ft/s w/rollator (12/18)  Goal status: NOT MET    3.  Pt will ambulate greater than or equal to 1250 feet on with LARD and improved postural control for improved cardiovascular endurance and BLE strength.   Baseline: 1,013' w/rollator (11/11) Goal status: IN PROGRESS   4.  FGA goal  Baseline:  Goal status: DC  5.  Pt will lift 30# weight from ground using proper body mechanics for improved posterior chain strength and stability w/lifting.  Baseline:  Goal status: IN PROGRESS   NEW LONG TERM GOALS:  Target date: 12/09/2024  Pt will improve 5 x STS to less than or equal to 28 seconds from 18 height without UE support to demonstrate improved functional strength and transfer efficiency.  Baseline: 32.35s w/BUE support  (12/18) Goal status: INITIAL  2.  Pt will lift 30# weight from ground using proper body mechanics for improved posterior chain strength and stability w/lifting.  Baseline:  Goal status: IN PROGRESS  3.  Pt will ambulate greater than or equal to 1250 feet on with LARD and improved postural control for improved cardiovascular endurance and BLE strength.  Baseline: 1,013' w/rollator (11/11) Goal status: IN PROGRESS  ASSESSMENT:  CLINICAL IMPRESSION: Emphasis of skilled PT session on functional BLE strength, core stability and postural control. Pt able to perform spanish squats from 19 seat height without UE support today for the first time, indicative of improved quad and glute strength. Pt continues to require min cues to facilitate anterior lean and avoid premature knee extension, as this causes pt to lose balance posteriorly. However, pt demonstrated improved eccentric control today without cues. Continue POC.    OBJECTIVE IMPAIRMENTS: Abnormal gait, decreased activity tolerance, decreased balance, decreased endurance, decreased knowledge of condition, decreased mobility, difficulty walking, decreased strength, improper body mechanics, and postural dysfunction  ACTIVITY LIMITATIONS: carrying, lifting, bending, squatting, stairs, transfers, locomotion level, and caring for others  PARTICIPATION LIMITATIONS: cleaning, interpersonal relationship, driving, shopping, community activity, and yard work  PERSONAL FACTORS: 1 comorbidity: bilateral peripheral neuropathy are also affecting patient's functional outcome.   REHAB POTENTIAL: Good  CLINICAL DECISION MAKING: Evolving/moderate complexity  EVALUATION COMPLEXITY: Moderate  PLAN:  PT FREQUENCY: 1-2x/week (recert)   PT DURATION: 6 weeks + 4 weeks (recert)   PLANNED INTERVENTIONS: 02835- PT Re-evaluation, 97750- Physical Performance  Testing, 97110-Therapeutic exercises, 97530- Therapeutic activity, [id number]- Neuromuscular  re-education, [phone]- Self Care, 02859- Manual therapy, [id number]- Gait training, [phone]- Orthotic Initial, [phone]- Orthotic/Prosthetic subsequent, [phone]- Aquatic Therapy, [phone]- Electrical stimulation (manual), [phone] (1-2 muscles), [phone] (3+ muscles)- Dry Needling, Patient/Family education, Balance training, Stair training, Joint mobilization, Spinal mobilization, Vestibular training, and DME instructions  PLAN FOR NEXT SESSION:  DC vs recert. Review HEP. Work on lifting, sit to stands w/reduced UE reliance, leg presses, functional hip strength, glute med strength, modified RDLs, prone I/Y/T, core stability, tall kneel tasks, spanish squats, step ups, Spanish squats w/black band    Kelten Enochs E Lui Bellis, PT, DPT 12/02/2024, 3:20 PM        "

## 2024-12-09 ENCOUNTER — Ambulatory Visit: Admitting: Physical Therapy

## 2024-12-11 ENCOUNTER — Ambulatory Visit: Admitting: Physical Therapy

## 2024-12-11 DIAGNOSIS — M6281 Muscle weakness (generalized): Secondary | ICD-10-CM

## 2024-12-11 DIAGNOSIS — R2689 Other abnormalities of gait and mobility: Secondary | ICD-10-CM

## 2024-12-11 DIAGNOSIS — M21371 Foot drop, right foot: Secondary | ICD-10-CM

## 2024-12-11 DIAGNOSIS — R2681 Unsteadiness on feet: Secondary | ICD-10-CM

## 2024-12-11 DIAGNOSIS — M21372 Foot drop, left foot: Secondary | ICD-10-CM

## 2024-12-11 DIAGNOSIS — R293 Abnormal posture: Secondary | ICD-10-CM

## 2024-12-11 NOTE — Therapy (Signed)
 " OUTPATIENT PHYSICAL THERAPY NEURO TREATMENT - RECERTIFICATION    Patient Name: [name] MRN: [id number] DOB:07-17-1946, 79 y.o., male Today's Date: 12/11/2024   PCP: Avelina Greig BRAVO, MD REFERRING PROVIDER: Avelina Greig BRAVO, MD   END OF SESSION:  PT End of Session - 12/11/24 1403     Visit Number 16    Number of Visits 24   Recert   Date for Recertification  02/12/25    Authorization Type [employer]    Authorization Time Period 17 PT visits 09/18/2024 - 12/17/2024    PT Start Time 1401    PT Stop Time 1446    PT Time Calculation (min) 45 min    Equipment Utilized During Treatment Gait belt;Other (comment)   L [place] WalkOn AFO   Activity Tolerance Patient tolerated treatment well    Behavior During Therapy WFL for tasks assessed/performed               Past Medical History:  Diagnosis Date   Agatston coronary artery calcium  score greater than 400    Coronary calcium  score of 533. This was 69th percentile for age,   Allergy 15 yrs. or more   mosquito or wasp bites bad reaction   Aortic atherosclerosis    Arrhythmia    Cataract 10 yrs. or more   slow bloom checkup every 6 mos.   Hyperlipidemia LDL goal <70    Neuromuscular disorder (HCC) 09/2018   Parkinsons   Parkinson disease (HCC)    Sleep apnea 02/21/2021 sleep study   have Resair machine   Past Surgical History:  Procedure Laterality Date   TONSILLECTOMY     WISDOM TOOTH EXTRACTION     Patient Active Problem List   Diagnosis Date Noted   Elevated TSH 11/11/2024   Osteopenia of lumbar spine 11/11/2024   Leg weakness, bilateral 11/11/2024   Gait instability 06/23/2024   Scoliosis 06/23/2024   Agatston coronary artery calcium  score greater than 400 02/02/2022   Aortic atherosclerosis 02/01/2022   SVT (supraventricular tachycardia) 01/12/2022   OSA (obstructive sleep apnea) 01/12/2022   Edema of right lower leg 11/25/2020   Hyperlipidemia LDL goal <70 11/25/2020   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 Reports doing well, has been doing his HEP. Is thinking about returning to the [facility] to work on weightlifting, enjoyed this last session. No falls.    Pt accompanied by: Self   PERTINENT HISTORY:  HLD, cellulitis, lymphedema, low back pain, hearing loss, (undiagnosed w/PD in 2025)  PAIN:  Are you having pain? No  PRECAUTIONS: Fall  RED FLAGS: None   WEIGHT BEARING RESTRICTIONS: No  FALLS: Has patient fallen in last 6 months? No  LIVING ENVIRONMENT: Lives with: lives with their spouse Lives in: House/apartment Stairs: Yes: External: 2-3 steps; on right going up, on left going up, and can reach both Has following equipment at home: Walker - 4 wheeled, shower chair, Ramped entry, and StrongArm cane  PLOF: Requires assistive device for independence  PATIENT GOALS: I want to be able to stand up and walk without this thing (rollator) and strengthen this thing (RLE)   OBJECTIVE:  Note: Objective measures were completed at Evaluation unless otherwise noted.  DIAGNOSTIC FINDINGS: MRI of C-spine from 06/05/24  IMPRESSION: 1. Cervical spondylosis as outlined within the body of the report. 2. No significant spinal canal stenosis. 3. Multilevel foraminal stenosis, greatest on the right at C5-C6 (moderate at this  site). 4. No more than mild disc degeneration within the cervical spine. 5. Multilevel facet arthropathy, greatest on the left at C4-C5 (advanced at this site). 6. Facet ankylosis on the left at C3-C4. 7. Mild grade 1 anterolisthesis at C4-C5 and C5-C6.  MRI of lumbar spine from 04/30/24 IMPRESSION: 1. 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 2. 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 3. Mild spinal canal stenosis at L2-3. 4. No spinal canal stenosis or neural foraminal narrowing in the thoracic spine.  MRI of T-spine 04/30/24 IMPRESSION: 1. 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 2. 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 3. Mild spinal canal stenosis at L2-3. 4. No spinal canal stenosis or neural foraminal narrowing in the thoracic spine.  NCV w/EMG from 06/30/24  NCV & EMG Findings: Extensive electrodiagnostic evaluation of bilateral lower limbs shows: Bilateral sural and superficial peroneal/fibular sensory responses are absent. Bilateral peroneal/fibular (EDB) and tibial (AH) motor responses are absent. Bilateral peroneal/fibular (TA) motor responses show reduced amplitude (L1.69, R1.67 mV). Bilateral H reflex latencies are absent. Chronic motor axon loss changes WITH active denervation changes are seen in bilateral tibialis anterior and bilateral medial head of gastrocnemius muscles.   Impression: This is an abnormal study. 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 The findings are at least moderate in degree electrically. No definitive electrodiagnostic evidence of a right or  left lumbosacral (L3-S1) motor radiculopathy.  COGNITION: Overall cognitive status: Within functional limits for tasks assessed   SENSATION: Pt denies numbness/tingling in all extremities    EDEMA: Pt has chronic swelling of distal RLE    POSTURE: rounded shoulders, forward head, increased thoracic kyphosis, and flexed trunk   LOWER EXTREMITY ROM:     Active  Right Eval Left Eval  Hip flexion    Hip extension    Hip abduction    Hip adduction    Hip internal rotation    Hip external rotation    Knee flexion  Knee extension    Ankle dorsiflexion    Ankle plantarflexion    Ankle inversion    Ankle eversion     (Blank rows = not tested)  LOWER EXTREMITY MMT:  Tested in seated position   MMT Right Eval Left Eval  Hip flexion 5 5  Hip extension    Hip abduction 5 5  Hip adduction 5 5  Hip internal rotation    Hip external rotation    Knee flexion 5 5  Knee extension 5 5  Ankle dorsiflexion 5 5  Ankle plantarflexion    Ankle inversion    Ankle eversion    (Blank rows = not tested)  BED MOBILITY:  Not tested Independent per pt   TRANSFERS: Sit to stand: Modified independence  Assistive device utilized: None     Stand to sit: Modified independence  Assistive device utilized: None      RAMP:  Not tested  CURB:  Not tested  STAIRS: Not tested GAIT: Gait pattern: Bilateral genu valgus, excessive R ankle inversion/pronation, step through pattern, decreased step length- Left, decreased stance time- Right, decreased stride length, decreased hip/knee flexion- Right, decreased ankle dorsiflexion- Right, decreased ankle dorsiflexion- Left, lateral hip instability, lateral lean- Right, decreased trunk rotation, trunk flexed, and narrow BOS Distance walked: Various short clinic distances  Assistive device utilized: Walker - 4 wheeled Level of assistance: SBA Comments: Pt ambulates w/excessive thoracic kyphosis and forward flexed posture.    FUNCTIONAL TESTS:    [facility] PT Assessment - 12/11/24 1502       Balance   Balance Assessed Yes      Standardized Balance Assessment   Standardized Balance Assessment Five Times Sit to Stand    Five times sit to stand comments  22.13s   From 18 height, no UE support                                                                                                                                        TREATMENT:   Physical Performance/Ther Act    [facility] PT Assessment - 12/11/24 1502       Balance   Balance Assessed Yes      Standardized Balance Assessment   Standardized Balance Assessment Five Times Sit to Stand    Five times sit to stand comments  22.13s   From 18 height, no UE support            Gait pattern: L foot slap, step through pattern, decreased hip/knee flexion- Right, decreased hip/knee flexion- Left, decreased ankle dorsiflexion- Right, decreased ankle dorsiflexion- Left, Right foot flat, lateral hip instability, trunk flexed, and poor foot clearance- Left Distance walked: 115' loop completed 9x + 10' = 1,045'  Assistive device utilized: [employer]ant - 4 wheeled Level of assistance: SBA and CGA Comments: Noted increased L foot slap w/fatigue as well as increased trunk flexion.   Discussed L foot  slap w/pt and tested ankle DF strength in BLEs. Pt able to sustain anterior tibialis contraction on RLE against resistance, but not on LLE.    Gait pattern: step through pattern, decreased stride length, decreased hip/knee flexion- Left, decreased ankle dorsiflexion- Left, and lateral hip instability Distance walked: 230' Assistive device utilized: Environmental [employer]ant - 4 wheeled and L [employer] WalkOn AFO Level of assistance: SBA Comments: Trialed use of AFO on LLE and pt reported feeling slower but more controlled. Noted improved postural control and stance time on LLE w/use of brace. Discussed potential to trial a brace on RLE next session too, as pt reports feeling most unstable on R side.     PATIENT EDUCATION: Education details:  Continue HEP, goal results, purpose and plan for AFO Person educated: Patient Education method: Explanation, Demonstration, Tactile cues, and Verbal cues Education comprehension: verbalized understanding, returned demonstration, verbal cues required, tactile cues required, and needs further education  HOME EXERCISE PROGRAM:  Access Code: [email or web address] URL: [email or web address] Date: 09/23/2024 Prepared by: Marlon Doreen Garretson  Exercises - Wall push up with strong push away  - 1 x daily - 7 x weekly - 3 sets - 10 reps - Dead Bug  - 1 x daily - 7 x weekly - 3 sets - 10 reps - Correct Standing Posture  - 1 x daily - 5 x weekly - 30 hold - Standing Balance with Eyes Closed on Foam  - 1 x daily - 5 x weekly - 3 sets - 30 hold - Standing march with overhead hold   - 1 x daily - 7 x weekly - 3 sets - 10 reps - Seated Ankle Alphabet  - 1 x daily - 7 x weekly - 3 sets - 10 reps - Ankle Inversion with Resistance  - 1 x daily - 7 x weekly - 3 sets - 10 reps - Ankle Eversion with Resistance  - 1 x daily - 7 x weekly - 3 sets - 10 reps - Ankle Dorsiflexion with Resistance  - 1 x daily - 7 x weekly - 3 sets - 10 reps - Ankle and Toe Plantarflexion with Resistance  - 1 x daily - 7 x weekly - 3 sets - 10 reps - Calf stretch  - 1 x daily - 7 x weekly - 3 sets - 10 reps - Seated Cervical Sidebending Stretch  - 1 x daily - 7 x weekly - 1 sets - 4 reps - 30 second hold - Single Arm Doorway Pec Stretch at 90 Degrees Abduction  - 1 x daily - 7 x weekly - 2-3 reps - 30-60 seconds  hold - Static Prone on Elbows  - 1 x daily - 7 x weekly - 2 sets - 10 reps - Supine Bridge  - 1 x daily - 7 x weekly - 3 sets - 10 reps - 3-5 seconds hold (can perform in B-stance)  - Clamshell  - 1 x daily - 7 x weekly - 3 sets - 10 reps - 2-3 seconds  hold - Seated Deadlifts (see pt instructions) - Side Stepping with Resistance at Ankles and Counter Support  - 1 x daily - 7 x  weekly - Bird Dog  - 1 x daily - 7 x weekly - 3 sets - 10 reps - Serratus Activation at Wall with Foam Roll  - 1 x daily - 7 x weekly - 3 sets - 10 reps - Seated Shoulder Horizontal Abduction with Resistance  - 1 x daily - 7 x  weekly - 1-2 sets - 10 reps - Prone Scapular Retraction Y  - 1 x daily - 7 x weekly - 3 sets - 10 reps - changed to standing on 12/11  - Prone Scapular Retraction Arms at Side  - 1 x daily - 7 x weekly - 3 sets - 10 reps - Prone Shoulder Extension  - 1 x daily - 7 x weekly - 3 sets - 10 reps - Supine Bridge with Heels on Swiss Ball and Knees Bent  - 1 x daily - 7 x weekly - 3 sets - 10 reps  GOALS: Goals reviewed with patient? Yes   LONG TERM GOALS:  Target date: 12/09/2024  Pt will improve 5 x STS to less than or equal to 28 seconds from 18 height without UE support to demonstrate improved functional strength and transfer efficiency.  Baseline: 32.35s w/BUE support (12/18); 22.13s no UE support  Goal status: MET  2.  Pt will lift 30# weight from ground using proper body mechanics for improved posterior chain strength and stability w/lifting.  Baseline:  Goal status: DC  3.  Pt will ambulate greater than or equal to 1250 feet on with LRAD and improved postural control for improved cardiovascular endurance and BLE strength.  Baseline: 1,013' w/rollator (11/11); 1045' w/rollator (1/29) Goal status: NOT MET  NEW SHORT TERM GOALS:   Target date: 01/08/2025  Pt will improve 5 x STS to less than or equal to 24 seconds from 17 height w/no UE support to demonstrate improved functional strength and transfer efficiency.  Baseline: 22.13s from 18 height w/no UE support  Goal status: INITIAL   NEW LONG TERM GOALS:  Target date: 02/05/2025  Pt will be independent with gym-based HEP for improved strength, balance, transfers and gait. Baseline:  Goal status: INITIAL  2.  Pt will trial B AFOs for improved step clearance and ankle stability and determine if  appropriate to pursue for reduced fall risk  Baseline: trialed on LLE on 12/11/24 Goal status: INITIAL    ASSESSMENT:  CLINICAL IMPRESSION: Emphasis of skilled PT session on LTG assessment, discussing PT POC and trialing AFO on LLE. Pt has met 1 of 2 LTGs, improving his time on 5x STS to 22s w/no UE support from standard chair height. Pt ambulated 1,045' on w/rollator and required up to CGA as he fatigued due to L foot slap and increased truncal lean. Trialed use of AFO on LLE and pt reported feeling more stable w/use of brace. Will plan to trial brace on RLE in future sessions as well. DC lifting goal as pt continues to be limited by decreased spinal extensor strength and kyphotic posture, so deem goal unsafe for pt at this time due to anterior instability w/increased load. Pt in agreement to recert at a frequency of 1x/week after taking a 2 week pause to set up return to [employer] Continue POC.    OBJECTIVE IMPAIRMENTS: Abnormal gait, decreased activity tolerance, decreased balance, decreased endurance, decreased knowledge of condition, decreased mobility, difficulty walking, decreased strength, improper body mechanics, and postural dysfunction  ACTIVITY LIMITATIONS: carrying, lifting, bending, squatting, stairs, transfers, locomotion level, and caring for others  PARTICIPATION LIMITATIONS: cleaning, interpersonal relationship, driving, shopping, community activity, and yard work  PERSONAL FACTORS: 1 comorbidity: bilateral peripheral neuropathy are also affecting patient's functional outcome.   REHAB POTENTIAL: Good  CLINICAL DECISION MAKING: Evolving/moderate complexity  EVALUATION COMPLEXITY: Moderate  PLAN:  PT FREQUENCY: 1-2x/week (recert)   PT DURATION: 6 weeks + 4 weeks +  8 weeks (recert)   PLANNED INTERVENTIONS: 02835- PT Re-evaluation, 97750- Physical Performance Testing, 97110-Therapeutic exercises, 97530- Therapeutic activity, [id number]- Neuromuscular re-education, 97535- Self  Care, 02859- Manual therapy, [id number]- Gait training, [phone]- Orthotic Initial, [phone]- Orthotic/Prosthetic subsequent, [phone]- Aquatic Therapy, [phone]- Electrical stimulation (manual), [phone] (1-2 muscles), [phone] (3+ muscles)- Dry Needling, Patient/Family education, Balance training, Stair training, Joint mobilization, Spinal mobilization, Vestibular training, and DME instructions  PLAN FOR NEXT SESSION: Review HEP. Work on lifting, sit to stands w/reduced UE reliance, leg presses, functional hip strength, glute med strength, modified RDLs, prone I/Y/T, core stability, tall kneel tasks, spanish squats, step ups, Spanish squats w/black band, AFO on RLE   Wai Litt E Xylia Scherger, PT, DPT 12/11/2024, 3:02 PM        "

## 2024-12-12 ENCOUNTER — Telehealth: Payer: Self-pay | Admitting: *Deleted

## 2024-12-12 ENCOUNTER — Encounter: Payer: Self-pay | Admitting: Family Medicine

## 2024-12-12 ENCOUNTER — Ambulatory Visit: Payer: Self-pay | Admitting: Physical Therapy

## 2024-12-12 ENCOUNTER — Other Ambulatory Visit: Payer: Self-pay | Admitting: Family Medicine

## 2024-12-12 DIAGNOSIS — R7989 Other specified abnormal findings of blood chemistry: Secondary | ICD-10-CM

## 2024-12-12 DIAGNOSIS — E538 Deficiency of other specified B group vitamins: Secondary | ICD-10-CM

## 2024-12-12 NOTE — Telephone Encounter (Signed)
 Call and schedule lab appointment.

## 2024-12-12 NOTE — Progress Notes (Unsigned)
 I have ordered the thyroid  labs.  Please advise about Vitamin B12.

## 2024-12-12 NOTE — Progress Notes (Signed)
 Added B-12 lab

## 2024-12-12 NOTE — Telephone Encounter (Signed)
 Copied from CRM #[id number]. Topic: Clinical - Request for Lab/Test Order >> Dec 12, 2024 10:52 AM Antony RAMAN wrote: Reason for CRM: patient said dr avelina wants him to schedule labs FOR T3, T4 AND B12 but I'm not seeing them loaded into chart

## 2024-12-16 NOTE — Telephone Encounter (Signed)
 Called and schedule pt for  labs

## 2024-12-23 ENCOUNTER — Other Ambulatory Visit

## 2024-12-25 ENCOUNTER — Ambulatory Visit: Payer: Self-pay | Attending: Family Medicine | Admitting: Physical Therapy

## 2025-01-01 ENCOUNTER — Ambulatory Visit: Payer: Self-pay | Admitting: Physical Therapy

## 2025-04-27 ENCOUNTER — Ambulatory Visit

## 2025-04-28 ENCOUNTER — Ambulatory Visit

## 2025-10-13 ENCOUNTER — Ambulatory Visit: Admitting: Neurology
